# Patient Record
Sex: Male | Born: 1945 | Race: White | Hispanic: No | Smoking: Former smoker
Health system: Southern US, Community
[De-identification: ages and names within clinical notes are randomized; demographics above are authoritative.]

## PROBLEM LIST (undated history)

## (undated) DIAGNOSIS — Z85820 Personal history of malignant melanoma of skin: Secondary | ICD-10-CM

## (undated) DIAGNOSIS — I2510 Atherosclerotic heart disease of native coronary artery without angina pectoris: Secondary | ICD-10-CM

## (undated) DIAGNOSIS — I10 Essential (primary) hypertension: Secondary | ICD-10-CM

## (undated) DIAGNOSIS — I4891 Unspecified atrial fibrillation: Secondary | ICD-10-CM

## (undated) DIAGNOSIS — C801 Malignant (primary) neoplasm, unspecified: Secondary | ICD-10-CM

## (undated) DIAGNOSIS — K449 Diaphragmatic hernia without obstruction or gangrene: Secondary | ICD-10-CM

## (undated) DIAGNOSIS — G4733 Obstructive sleep apnea (adult) (pediatric): Secondary | ICD-10-CM

## (undated) DIAGNOSIS — G510 Bell's palsy: Secondary | ICD-10-CM

## (undated) DIAGNOSIS — E119 Type 2 diabetes mellitus without complications: Secondary | ICD-10-CM

## (undated) HISTORY — DX: Obstructive sleep apnea (adult) (pediatric): G47.33

## (undated) HISTORY — DX: Type 2 diabetes mellitus without complications: E11.9

## (undated) HISTORY — DX: Atherosclerotic heart disease of native coronary artery without angina pectoris: I25.10

## (undated) HISTORY — PX: REPLACEMENT TOTAL KNEE: SUR1224

## (undated) HISTORY — DX: Diaphragmatic hernia without obstruction or gangrene: K44.9

## (undated) HISTORY — DX: Essential (primary) hypertension: I10

## (undated) HISTORY — DX: Bell's palsy: G51.0

## (undated) HISTORY — DX: Personal history of malignant melanoma of skin: Z85.820

## (undated) HISTORY — DX: Unspecified atrial fibrillation: I48.91

## (undated) HISTORY — PX: MELANOMA EXCISION: SHX5266

## (undated) HISTORY — PX: UMBILICAL HERNIA REPAIR: SHX196

---

## 2004-03-16 ENCOUNTER — Inpatient Hospital Stay (HOSPITAL_COMMUNITY): Admission: RE | Admit: 2004-03-16 | Discharge: 2004-03-19 | Payer: Self-pay | Admitting: Orthopedic Surgery

## 2007-07-30 ENCOUNTER — Ambulatory Visit: Payer: Self-pay | Admitting: Cardiology

## 2007-07-31 ENCOUNTER — Inpatient Hospital Stay (HOSPITAL_COMMUNITY): Admission: EM | Admit: 2007-07-31 | Discharge: 2007-08-02 | Payer: Self-pay | Admitting: Cardiology

## 2007-07-31 ENCOUNTER — Ambulatory Visit: Payer: Self-pay | Admitting: Cardiovascular Disease

## 2010-09-29 NOTE — Cardiovascular Report (Signed)
Derek Bond, Derek Bond                ACCOUNT NO.:  [id number]   MEDICAL RECORD NO.:  [id number]          PATIENT TYPE:  INP   LOCATION:  4738                         FACILITY:  [facility]   PHYSICIAN:  Veverly Fells. Excell Seltzer, MD  DATE OF BIRTH:  12/11/1945   DATE OF PROCEDURE:  08/01/2007  DATE OF DISCHARGE:                            CARDIAC CATHETERIZATION   PROCEDURE:  Left heart catheterization, selective coronary angiography,  left ventricular angiography.   INDICATIONS:  Mr. People is a 65 year old gentleman with multiple  cardiac risk factors; including diabetes, hypertension, a family  history, and dyslipidemia.  He presented with typical symptoms of angina  relieved by nitroglycerin.  He also has an abnormal resting EKG.  He was  referred for cardiac catheterization.  Risks and indications of the  procedure were reviewed with the patient.  Informed consent was  obtained.   DESCRIPTION OF PROCEDURE:  The right groin was prepped, draped, and  anesthetized with 1% lidocaine using the modified Seldinger technique.  A 6-French sheath was placed in the right femoral artery.  Standard 6-  French Judkins catheters were used for coronary angiography.  I was  unable to engage the right coronary artery with the JR-4 catheter, so an  AR-1 catheter.  A pigtail catheter was used for left ventriculography.  The patient tolerated procedure well, and had no immediate  complications.  All catheter exchanges were performed over a guidewire.   FINDINGS:  Aortic pressure 118/63 with a mean of 86, left ventricular  pressure 119/90.   CORONARY ANGIOGRAPHY:  1. The left mainstem is angiographically normal.  It it trifurcates      into the LAD intermediate branch and left circumflex.  2. The LAD is large-caliber vessel that courses down and wraps around      the LV apex.  There are mild luminal irregularities throughout the      LAD.  Just between the first septal perforator and the first      diagonal  branch; there is a mild, nonobstructive, 20% stenosis.      The first diagonal branch is of moderate size, and has mild      nonobstructive disease in its branch proximal portion.  The mid-LAD      has mild diffuse plaque.  The distal vessel has a focal      nonobstructive 40% lesion.  3. The left circumflex is a large vessel.  There is a large      intermediate branch that bifurcates into twin vessels, and has no      significant stenosis.  There is also a large first OM branch that      has no significant stenosis.  The circumflex courses down and      supplies two posterolateral branches with no significant stenosis.  4. The right coronary artery is dominant.  There is mild diffuse      plaque in the proximal and midportions with no more than 20%      stenosis.  The vessel supplies two acute marginal branches, and a      PDA branch, as well  as a posterolateral branch.   LEFT VENTRICULAR FUNCTION:  Normal.  The LVEF is 65%.  There is no  mitral regurgitation.   ASSESSMENT:  1. Minimal diffuse nonobstructive coronary artery disease.  2. Normal left ventricular function.   PLAN:  Recommend medical therapy with treatment of cardiac risk factors.  I suspect Mr. Rexrode' chest pain is noncardiac in nature.      Veverly Fells. Excell Seltzer, MD  Electronically Signed     MDC/MEDQ  D:  08/01/2007  T:  08/01/2007  Job:  [id number]   cc:   Learta Codding, MD,FACC

## 2010-09-29 NOTE — Discharge Summary (Signed)
NAMEDONNELL, WION                ACCOUNT NO.:  [id number]   MEDICAL RECORD NO.:  [id number]          PATIENT TYPE:  INP   LOCATION:  4738                         FACILITY:  [facility]   PHYSICIAN:  Veverly Fells. Excell Seltzer, MD  DATE OF BIRTH:  1945-05-30   DATE OF ADMISSION:  08/01/2007  DATE OF DISCHARGE:  08/02/2007                               DISCHARGE SUMMARY   PRIMARY CARDIOLOGIST:  Learta Codding, MD.   DISCHARGE DIAGNOSIS:  Chest pain.   SECONDARY DIAGNOSES:  1. Hypertension.  2. Hyperlipidemia.  3. Diabetes mellitus.  4. Family history of coronary disease.  5. Remote tobacco abuse.  6. History of melanoma in 1996 without recurrence.  7. History of umbilical hernia.  8. Status post left total knee replacement.   ALLERGIES:  NO KNOWN DRUG ALLERGIES.   PROCEDURE:  Left heart cardiac catheterization.   HISTORY OF PRESENT ILLNESS:  A 65 year old male without prior cardiac  history, however, with multiple risk factors.  He presented to [facility] on July 30, 2007 with complaints of chest pain and fatigue as  well as dyspnea.  He was evaluated by Rozell Searing, the physician  assistant on July 31, 2007 along with Dr. Andee Lineman.  Decision was made to  pursue left heart cardiac catheterization.   HOSPITAL COURSE:  The patient was transferred to [facility] on July 31, 2007 and underwent left heart cardiac catheterization on August 01, 2007  revealing minimal nonobstructive coronary artery disease with normal LV  function and an EF of 65%.  He has had no recurrent chest discomfort.  In light of his normal catheterization, he will be discharged home today  in good condition.  We have initiated statin therapy with an LDL of 117  in combination with his history of diabetes and now nonobstructive  coronary artery disease.   LABORATORY DATA:  Total cholesterol 173, triglycerides 129, HDL 117, LDL  30, hemoglobin 13.3, hematocrit 38.7, WBC 5.9, platelets 220, MCV 91.1,  sodium 139,  potassium 3.6, chloride 106, CO2 26, BUN 9, creatinine 0.71,  glucose 98, calcium 9.6.   DISPOSITION:  The patient is being discharged home today in good  condition.   FOLLOW-UP APPOINTMENTS:  He is asked to follow up with Dr. Sherril Croon in 1-2  weeks.   DISCHARGE MEDICATIONS:  1. Aspirin 81 mg daily.  2. Simvastatin 40 mg q.h.s.  3. Glipizide 10 mg b.i.d.  4. Exforge 5/160 mg daily.  5. Diclofenac 75 mg b.i.d.  6. Metformin 500 mg b.i.d. to resume August 04, 2007.   OUTSTANDING LAB STUDIES:  The patient should have repeat lipids and LFTs  in approximately 8 weeks.      Nicolasa Ducking, ANP      Veverly Fells. Excell Seltzer, MD  Electronically Signed    CB/MEDQ  D:  08/02/2007  T:  08/02/2007  Job:  [id number]   cc:   Doreen Beam, MD

## 2010-10-02 NOTE — Discharge Summary (Signed)
NAMEELEUTERIO, DOLLAR                ACCOUNT NO.:  [id number]   MEDICAL RECORD NO.:  [id number]          PATIENT TYPE:  INP   LOCATION:  0452                         FACILITY:  [facility]   PHYSICIAN:  Ollen Gross, M.D.    DATE OF BIRTH:  1945-09-29   DATE OF ADMISSION:  03/16/2004  DATE OF DISCHARGE:  03/19/2004                                 DISCHARGE SUMMARY   ADMISSION DIAGNOSES:  1.  Osteoarthritis left knee.  2.  Hypertension.  3.  Non-insulin-dependent diabetes mellitus.  4.  History of melanoma cancer.  5.  Patient with a history of bronchitis.  6.  Past history of ulcers.  7.  Asymptomatic hiatal hernia.  8.  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  9.  History of sleep apnea.   DISCHARGE DIAGNOSES:  1.  Osteoarthritis left knee status post left total knee replacement      arthroplasty computer navigation.  2.  Mild postoperative blood loss anemia that did not require transfusion.  3.  Mild hyponatremia and mild hypokalemia postop.  4.  Hypertension.  5.  Non-insulin-dependent diabetes mellitus.  6.  History of melanoma cancer.  7.  Patient with a history of bronchitis.  8.  Past history of ulcers.  9.  Asymptomatic hiatal hernia.  10. 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  11. History of sleep apnea.   PROCEDURE:  Date of surgery March 16, 2004, left total knee arthroplasty  computer navigation.  Surgeon Dr. Homero Fellers Aluisio's, assistant Avel Peace,  P.A.-C., anesthesia general, postop Marcaine pain pump. Minimal blood loss.  Hemovac drain x1, tourniquet time 71 minutes at 300 mmHg.   CONSULTATIONS:  None.   BRIEF HISTORY:  A 65 year old male with severe left knee pain that has been  refractory to nonoperative management and has known bone on bone arthritis  through a previous arthroscopy with a large medial femoral condyle defect  and patellofemoral arthritis felt to benefit from a total knee, subsequently  admitted to the  hospital.   LABORATORY DATA:  CBC preop, hemoglobin of 15.9, hematocrit of 46.8,  differential within normal limits, postop H&H 13.8 and 40.2, last noted H&H  12.4 and 36.0.  PT and PTT preop 12.6 and 28 respectively with INR of 0.9.  Serial pro times followed, last noted PT/INR 16.2 and 1.4.  Chem panel on  admission glucose elevated at 177, remaining chem panel all within normal  limits.  Serial BMET's are followed. Sodium did drop from 138 to 133  stabilized at 133. He had a drop in his potassium from 4.8 to 4.0, last  noted at 3.4.  Glucose came down from 177 to 121.  Urinalysis positive for  glucose otherwise negative, blood group type O positive.   EKG dated March 10, 2004 normal sinus rhythm, septal infarct age  undetermined, RSR, Q/R patter and V1 suggests right ventricular conduction  delay, no previous tracing confirmed by Dr. Lady Deutscher.  A two view chest  March 10, 2004 with no active disease, minimal bibasilar atelectasis  and/or scarring.   HOSPITAL COURSE:  The patient was admitted  to [facility], taken to  the OR and underwent the above procedure, later transferred to the recovery  room on the orthopedic floor, started back on home medications. Actually had  a fair amount of postop pain and was treated with p.o. and IV analgesics,  started back on his home medications and Hemovac drain that was placed at  the time of surgery was pulled on day one.  He started getting up with  physical therapy out of bed. By day two,  he started progressive a little  bit better and was ambulating approximately 25 feet in the morning and got  up to about 60 feet that afternoon.  The dressing was change, incision was  healing well with no signs of infection.  Serum glucose which was elevated  initially after surgery did improve and his CBG's were running in the 115-  120 range. By day two, he was back on his diabetic medications.  He  continued to progress well and by day  three, his pain was under good  control. He was progressing and ambulating 200 feet with therapy. It was  decided the patient could be discharged home at that time.   PLAN:  1.  The patient discharged home on March 19, 2004.  2.  Discharge diagnosis, please see above.  3.  Discharge medications, Coumadin, Percocet, Robaxin.  4.  Diet, diabetic.  5.  Followup two weeks from surgery.  6.  Activity, home health PT, home health nursing, total knee protocol.     Alex   ALP/MEDQ  D:  04/23/2004  T:  04/24/2004  Job:  [id number]   cc:   Doreen Beam  [address]  [place]  [place] [address]  Fax: [phone]

## 2010-10-02 NOTE — Op Note (Signed)
Derek Bond, Derek Bond                ACCOUNT NO.:  [id number]   MEDICAL RECORD NO.:  [id number]          PATIENT TYPE:  INP   LOCATION:  0004                         FACILITY:  [facility]   PHYSICIAN:  Ollen Gross, M.D.    DATE OF BIRTH:  Apr 25, 1946   DATE OF PROCEDURE:  03/16/2004  DATE OF DISCHARGE:                                 OPERATIVE REPORT   PREOPERATIVE DIAGNOSIS:  Osteoarthritis, left knee.   POSTOPERATIVE DIAGNOSIS:  Osteoarthritis, left knee.   PROCEDURE:  Right total knee arthroplasty, computer navigated.   SURGEON:  Gus Rankin. Aluisio, M.D.   ASSISTANT:  Avel Peace, PA-C   ANESTHESIA:  General with postop Marcaine pain pump.   ESTIMATED BLOOD LOSS:  Minimal.   DRAIN:  Hemovac x 1.   TOURNIQUET TIME:  71 minutes at 300 mmHg.   COMPLICATIONS:  None.   CONDITION:  Stable to recovery.   BRIEF CLINICAL NOTE:  Derek Bond is a 65 year old male with severe left knee  pain which has been refractory to nonoperative management including  injections.  He has had a previous arthroscopy showing bone-on-bone disease  in the patellofemoral compartment as well as a large medial femoral condyle  chondral defect.  He presents now for left total knee arthroplasty secondary  to intractable pain.   PROCEDURE IN DETAIL:  After the successful administration of general  anesthetic, a tourniquet is placed high on his left thigh and left lower  extremity prepped and draped in the usual sterile fashion.  The extremity is  wrapped in Esmarch, knee flexed, then tourniquet inflated to 300 mmHg.  A  standard midline incision is made with a 10 blade through subcutaneous  tissue to the level of the extensor mechanism.  A fresh blade is used to  make a medial parapatellar arthrotomy, and then the soft tissue over the  proximal and medial tibia is subperiosteally elevated to the joint line with  a knife and into the semimembranosus bursa with a Cobb elevator.  Soft  tissue over the proximal  and lateral tibia is also elevated with attention  being paid to avoiding the patellar tendon on tibial tubercle.  The patella  is everted, knee flexed 90 degrees, and ACL and PCL are removed.  It was  bone-on-bone at the patellofemoral compartment.  He did have a 2 x 2 cm  defect in the medial femoral condyle with fiber cartilage covering it but  with some of the fiber cartilage actively delaminating.  We then placed the  guidepins for the computer navigation arrays.  The arrays are placed, and  then the computer inputting is performed to generate the anatomic models of  the femur and tibia.  His preop is about 4 degrees varus and 5-degree  flexion contracture.  Once the inputting is completed, then we were prepared  for the distal femur cut.  The cutting block is placed.  Then under the  computer navigation, we put it in neutral varus-valgus and removed 10 mm off  the distal femur.  Distal femoral resection is made with an oscillating saw.  We confirmed that  the cut was in good position.  The sizing block is placed,  and we got a size 4 with the intraoperative sizing block.  The computer  generated a size 5 on the model, but size 4 was more appropriate.  We then  placed the rotation off the epicondylar axis, put the size 4 cutting block  on, and made the anterior, posterior, and chamfer cuts.  I then made an  intercondylar cut and placed the size 4 femoral trial with excellent fit.   Tibia is subluxed forward, and the menisci are removed.  The tibial cutting  guide is placed to remove 10 mm off the nondeficient lateral side.  The  block is placed in neutral varus-valgus and neutral posterior slope.  The  block is pinned, and then the cut is made with the oscillating saw.  Size 4  is also the most appropriate tibial component, and the proximal tibia is  then prepared with the modular drill and keel punch for the size 4.  A 10 mm  posterior stabilized rotating platform trial is placed.   Full extension is  achieved with excellent varus and valgus balance throughout full range of  motion.  Patella is everted, thickness measured to be 24 mm, free-hand  resection taken to 14 mm, 38 template placed, lug holes are drilled; trial  patellar is placed, and it tracks normally.  The trials are then removed and  the osteophytes removed off the posterior femur with an osteotome.  The pins  for the computer arrays are then removed.  Cement is mixed, and cut bone  surfaces are prepared with pulsatile lavage.  Once ready for implantation,  the size 4 mobile bearing tibial tray, size 4 posterior stabilized femur,  and 38 patella are cemented into place and patella is held with a clamp.  Trial 10 mm insert is placed and knee held in full extension and all  extruded cement removed.  Once the cement is fully hardened, then the  permanent 10 mm posterior stabilized rotating platform insert is placed into  the tibial tray.  The wound is then copiously irrigated with saline solution  and the extensor mechanism closed over a Hemovac drain with interrupted #1  PDS.  Flexion against gravity is 140 degrees.  Tourniquet is released for a  total time of 71 minutes.  Subcu is closed with interrupted 2-0 Vicryl and  subcuticular with running 4-0 Monocryl.  The catheter for the Marcaine pain  pump is placed and the pump initiated.  Steri-Strips and a bulky sterile  dressing are applied.  The Hemovac is hooked to suction.  The knee is placed  into a knee immobilizer, awakened, and transported to recovery in stable  condition.     Drenda Freeze   FA/MEDQ  D:  03/16/2004  T:  03/16/2004  Job:  [id number]

## 2010-10-02 NOTE — H&P (Signed)
Derek Bond, Derek Bond                ACCOUNT NO.:  [id number]   MEDICAL RECORD NO.:  [id number]          PATIENT TYPE:  INP   LOCATION:  NA                           FACILITY:  [facility]   PHYSICIAN:  Ollen Gross, M.D.    DATE OF BIRTH:  [birth year]   DATE OF ADMISSION:  DATE OF DISCHARGE:                                HISTORY & PHYSICAL   CHIEF COMPLAINT:  Left-knee pain.   HISTORY OF PRESENT ILLNESS:  The patient is a 65 year old male who is seen  by Dr. Ollen Gross, for ongoing left-knee arthritis.  He has undergone  Cortizone injections in the past which have not helped.  He was seen in the  office where x-rays showed significant medium compartment arthritis and also  patellofemoral arthritis.  He has been refractory to nonoperative  management, and would like to have something done about it.  The pain is  progressive to the point that it is limiting what he can and can not do.  He  is felt to be a candidate. The risks and benefits of the procedure have been  discussed with the patient, and he elects to proceed with surgery.   ALLERGIES:  No known drug allergies.   CURRENT MEDICATIONS:  1.  Altace 10 mg daily.  2.  Glucotrol 10 mg daily.   PAST MEDICAL HISTORY:  1.  Hypertension.  2.  Hypertension.  3.  Noninsulin-dependent diabetes mellitus.  4.  Melanoma cancer.  5.  History of bronchitis.  6.  Hiatal hernia.  7.  History of sleep apnea although he has lost weight and does not use his      machine any more.  8.  History of ulcers.  9.  Left facial paralysis secondary to laceration when he was 65 years old.   PAST SURGICAL HISTORY:  1.  Umbilical hernia.  2.  Melanoma excision x2, one from the left facial cheek, and one from the      left facial nerve.   SOCIAL HISTORY:  He is married.  He quit smoking about 10 years ago.  He  quit alcohol about 15 years ago.  He has two children.  Spouse will be  assisting with care after surgery.   FAMILY HISTORY:  Father  deceased at age 32 with lung cancer.  Mother  deceased in her 38's with diabetes.  She unfortunately died in a house fire.  Family history is also significant for hypertension, diabetes, rheumatoid  arthritis and stroke.   REVIEW OF SYSTEMS:  Fever, chills, night sweats.  He does have some left  partial facial paralysis.  He denies any visual problems.  He denies any  significant swallowing problems.  No seizures or stroke.  Respiratory, no  shortness of breath, cough or hemoptysis.  Cardiovascular, no chest pain,  angina or orthopnea.  GI:  No nausea, vomiting, diarrhea or constipation  although he does have an asymptomatic hiatal hernia.  GU:  No dysuria,  hematuria or discharge.  Musculoskeletal:  Pertinent of that of the knee as  in history of present illness.   PHYSICAL EXAMINATION:  VITAL SIGNS:  Pulse 76, respirations 12, blood  pressure 118/70.  GENERAL:  A 65 year old white male, well-nourished, well-developed, large  frame.  No acute distress.  He is alert and cooperative.  HEENT:  Normocephalic, atraumatic.  He does have a previous laceration scar  on the left side of the face starting in the left parietal region, coming  down toward his posterior facial cheek and toward his neck.  He does have  some partial facial paralysis with a slight eyebrow drooping in the left  eye.  He does have good motor control surrounding his mouth.  EOM's are  intact.  Oropharynx is clear.  He does have slight decrease in sensation in  the face on the three branches of the left facial trigeminal nerve.  NECK:  Supple.  CHEST:  Clear.  No rhonchi, rales or wheezing.  HEART:  Regular rhythm, no murmurs.  ABDOMEN:  Soft, nontender.  Bowel sounds are present.  Slightly round.  BREAST/ GENITALIA:  Not done, not pertinent to present illness.  EXTREMITIES:  Significant that left knee range of motion of 0-120 and a  significant crepitance on range of motion, pain with patellar compression,  pain  medially.  No instability.  He does ambulate with an antalgic gait.   IMPRESSION:  1.  Osteoarthritis left knee.  2.  Hypertension.  3.  Noninsulin-dependent diabetes mellitus.  4.  History of melanoma cancer.  5.  History of bronchitis.  6.  Past history of ulcers.  7.  Hiatal hernia, asymptomatic.  8.  Partial left facial paralysis secondary to a laceration.  9.  History of sleep apnea.  (currently not using his machine).   PLAN:  The patient will be admitted at [facility] and will undergo  left total knee arthroplasty.  Surgery will be performed by Dr. Ollen Gross.  The patient's medical doctor is Dr. Sherril Croon.  Dr. Sherril Croon will be  notified of the admission, and consulted if needed for medical assistance  with the patient during the hospital course.     Alex   ALP/MEDQ  D:  03/15/2004  T:  03/15/2004  Job:  [id number]   cc:   Doreen Beam  [address]  [place]  [place] [address]  Fax: [phone]   Ollen Gross, M.D.  [facility]  [address]  [address]  [place]  [place] [address]  Fax: [phone]

## 2011-02-08 LAB — BASIC METABOLIC PANEL
BUN: 9
BUN: 9
CO2: 26
CO2: 26
Calcium: 9.6
Calcium: 9.6
Chloride: 104
Chloride: 106
Creatinine, Ser: 0.68
Creatinine, Ser: 0.71
GFR calc Af Amer: >60
GFR calc Af Amer: >60
GFR calc non Af Amer: >60
GFR calc non Af Amer: >60
Glucose, Bld: 89
Glucose, Bld: 98
Potassium: 3.6
Potassium: 3.6
Sodium: 138
Sodium: 139

## 2011-02-08 LAB — CBC
HCT: 38.7 — ABNORMAL LOW
HCT: 39.7
Hemoglobin: 13.3
Hemoglobin: 13.5
MCHC: 33.9
MCHC: 34.3
MCV: 91.1
MCV: 92
Platelets: 220
Platelets: 228
RBC: 4.24
RBC: 4.32
RDW: 13.6
RDW: 13.7
WBC: 5.9
WBC: 6.2

## 2011-02-08 LAB — LIPID PANEL
Cholesterol: 173
HDL: 30 — ABNORMAL LOW
LDL Cholesterol: 117 — ABNORMAL HIGH
Total CHOL/HDL Ratio: 5.8
Triglycerides: 129
VLDL: 26

## 2011-02-08 LAB — HEPARIN LEVEL (UNFRACTIONATED): Heparin Unfractionated: 0.15 — ABNORMAL LOW

## 2016-02-09 ENCOUNTER — Encounter: Payer: Self-pay | Admitting: *Deleted

## 2016-02-10 ENCOUNTER — Encounter: Payer: Self-pay | Admitting: *Deleted

## 2016-02-10 ENCOUNTER — Telehealth: Payer: Self-pay | Admitting: Cardiology

## 2016-02-10 ENCOUNTER — Ambulatory Visit (INDEPENDENT_AMBULATORY_CARE_PROVIDER_SITE_OTHER): Payer: Medicare HMO | Admitting: Cardiology

## 2016-02-10 ENCOUNTER — Encounter: Payer: Self-pay | Admitting: Cardiology

## 2016-02-10 VITALS — BP 132/92 | HR 69 | Ht 78.0 in | Wt 238.0 lb

## 2016-02-10 DIAGNOSIS — I25119 Atherosclerotic heart disease of native coronary artery with unspecified angina pectoris: Secondary | ICD-10-CM

## 2016-02-10 DIAGNOSIS — Z794 Long term (current) use of insulin: Secondary | ICD-10-CM

## 2016-02-10 DIAGNOSIS — E1159 Type 2 diabetes mellitus with other circulatory complications: Secondary | ICD-10-CM | POA: Diagnosis not present

## 2016-02-10 DIAGNOSIS — I10 Essential (primary) hypertension: Secondary | ICD-10-CM | POA: Diagnosis not present

## 2016-02-10 DIAGNOSIS — R0609 Other forms of dyspnea: Secondary | ICD-10-CM | POA: Diagnosis not present

## 2016-02-10 NOTE — Progress Notes (Signed)
Cardiology Office Note  Date: 02/10/2016   ID: Derek Bond, DOB [birth year], MRN [id number]  PCP: Glenda Chroman, MD  Consulting Cardiologist: Rozann Lesches, MD   Chief Complaint  Patient presents with  . Dyspnea and exertion and fatigue  . Coronary Artery Disease    History of Present Illness: Derek Bond Ems is a 70 y.o. male referred for cardiology consultation by Dr. Woody Seller. He presents reporting a history of unusual fatigue and dyspnea on exertion a few months ago. Also feeling indigestion symptoms and fullness in his upper chest. This reminded him somewhat of prior trouble that he had with a hiatal hernia, but not completely. He states that he has been busy, also under stress, primary caregiver for his wife who underwent double lung transplant at [facility] about 8 years ago. Within the last few weeks he has been feeling somewhat better.  He has undergone prior cardiac evaluation, cardiac catheterization in 2009 demonstrated mild coronary atherosclerosis. More recent echocardiogram was obtained earlier this month at [facility] with results outlined below. He has not undergone updated ischemic testing.  I reviewed his medications which are outlined below. He reports no changes or intolerances.  Recent ECG shows sinus rhythm with incomplete left bundle branch block.  Past Medical History:  Diagnosis Date  . CAD (coronary artery disease)    Mild nonobstructive disease at cardiac catheterization 2009  . Essential hypertension   . 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 Hiatal hernia   . History of melanoma   . Obstructive sleep apnea   . Type 2 diabetes mellitus (Derek Bond)     Past Surgical History:  Procedure Laterality Date  . MELANOMA EXCISION     Left facial region  . REPLACEMENT TOTAL KNEE Left   . UMBILICAL HERNIA REPAIR      Current Outpatient Prescriptions  Medication Sig Dispense Refill  . amLODipine (NORVASC) 5 MG tablet Take 5  mg by mouth daily.    Marland Kitchen aspirin EC 81 MG tablet Take 81 mg by mouth daily.    . diclofenac (VOLTAREN) 75 MG EC tablet Take 75 mg by mouth 2 (two) times daily.    Marland Kitchen glipiZIDE (GLUCOTROL XL) 10 MG 24 hr tablet Take 10 mg by mouth 2 (two) times daily.    . insulin aspart (NOVOLOG) 100 UNIT/ML injection Inject 14 Units into the skin 3 (three) times daily before meals.    . Insulin Degludec (TRESIBA FLEXTOUCH [place]) Inject 46 Units into the skin.    Marland Kitchen losartan (COZAAR) 100 MG tablet Take 100 mg by mouth daily.    . metFORMIN (GLUCOPHAGE) 1000 MG tablet Take 1,000 mg by mouth 2 (two) times daily with a meal.    . simvastatin (ZOCOR) 40 MG tablet Take 40 mg by mouth daily.     No current facility-administered medications for this visit.    Allergies:  Review of patient's allergies indicates no known allergies.   Social History: The patient  reports that he quit smoking about 22 years ago. He has never used smokeless tobacco. He reports that he does not drink alcohol or use drugs.   Family History: The patient's family history includes Diabetes in his mother and other; Hypertension in his other; Lung cancer in his father; Rheum arthritis in his other; Stroke in his other.   ROS:  Please see the history of present illness. Otherwise, complete review of systems is positive for intermittent  leg cramping.  All other systems are reviewed and negative.   Physical Exam: VS:  BP (!) 132/92   Pulse 69   Ht 6\' 6"  (1.981 m)   Wt 238 lb (108 kg)   SpO2 95%   BMI 27.50 kg/m , BMI Body mass index is 27.5 kg/m.  Wt Readings from Last 3 Encounters:  02/10/16 238 lb (108 kg)    General: Tall male, appears comfortable at rest. HEENT: Conjunctiva and lids normal, oropharynx clear. Neck: Supple, no elevated JVP or carotid bruits, no thyromegaly. Lungs: Clear to auscultation, nonlabored breathing at rest. Cardiac: Regular rate and rhythm, no S3 or significant systolic murmur, no pericardial rub. Abdomen: Soft,  nontender, bowel sounds present, no guarding or rebound. Extremities: No pitting edema, distal pulses 1-2+. Skin: Warm and dry. Musculoskeletal: No kyphosis. Neuropsychiatric: Alert and oriented x3, affect grossly appropriate.  ECG: I personally reviewed the tracing from 01/27/2016 which showed sinus rhythm with incomplete left bundle branch block.  Recent Labwork:  September 2017: TSH 2.39, hemoglobin 14.2, platelets 254 July 2017: Hemoglobin A1c 7.1  Other Studies Reviewed Today:  Cardiac catheterization 08/01/2007:  FINDINGS:  Aortic pressure 118/63 with a mean of 86, left ventricular  pressure 119/90.   CORONARY ANGIOGRAPHY:  1. The left mainstem is angiographically normal.  It it trifurcates      into the LAD intermediate branch and left circumflex.  2. The LAD is large-caliber vessel that courses down and wraps around      the LV apex.  There are mild luminal irregularities throughout the      LAD.  Just between the first septal perforator and the first      diagonal branch; there is a mild, nonobstructive, 20% stenosis.      The first diagonal branch is of moderate size, and has mild      nonobstructive disease in its branch proximal portion.  The mid-LAD      has mild diffuse plaque.  The distal vessel has a focal      nonobstructive 40% lesion.  3. The left circumflex is a large vessel.  There is a large      intermediate branch that bifurcates into twin vessels, and has no      significant stenosis.  There is also a large first OM branch that      has no significant stenosis.  The circumflex courses down and      supplies two posterolateral branches with no significant stenosis.  4. The right coronary artery is dominant.  There is mild diffuse      plaque in the proximal and midportions with no more than 20%      stenosis.  The vessel supplies two acute marginal branches, and a      PDA branch, as well as a posterolateral branch.   LEFT VENTRICULAR FUNCTION:  Normal.   The LVEF is 65%.  There is no  mitral regurgitation.  Echocardiogram 02/02/2016 [facility]): Mild LVH with LVEF greater than 123456, diastolic dysfunction unspecified, mild left atrial enlargement, trace mitral regurgitation, borderline mild aortic stenosis, mild tricuspid regurgitation, no pericardial effusion.  Assessment and Plan:  1. History of unusual fatigue and dyspnea and exertion within the last few months. Also reported reflux symptoms with some chest fullness. ECG shows incomplete left bundle branch block. He has a history of long-standing type 2 diabetes mellitus and previously documented coronary atherosclerosis by cardiac catheterization in 2009. We discussed follow-up objective ischemic testing and will  arrange a [employer].  2. Nonobstructive CAD documented by cardiac catheterization in 2009. Follow-up ischemic testing is being arranged as outlined above. He is on aspirin and statin therapy.  3. Type 2 diabetes mellitus, on Glucotrol XL, Glucophage, and insulin. He follows with Dr. Woody Seller. Recent hemoglobin A1c 7.1.  4. Essential hypertension, blood pressure mildly elevated today. He reports compliance with his medications including Norvasc and Cozaar.  Current medicines were reviewed with the patient today.   Orders Placed This Encounter  Procedures  . NM Myocar Multi W/Spect W/Wall Motion / EF  . Myocardial Perfusion Imaging    Disposition: Call with results.  Signed, Satira Sark, MD, [facility] 02/10/2016 8:41 AM    [facility] [place], [place], [place] [address] Phone: [phone]; Fax: [phone]

## 2016-02-10 NOTE — Patient Instructions (Signed)
Medication Instructions:  Continue all current medications.  Labwork: none  Testing/Procedures:  Your physician has requested that you have a lexiscan myoview. For further information please visit [email or web address]. Please follow instruction sheet, as given.  Office will contact with results via phone or letter.    Follow-Up: To be determined based on test results.   Any Other Special Instructions Will Be Listed Below (If Applicable).  If you need a refill on your cardiac medications before your next appointment, please call your pharmacy.  

## 2016-02-10 NOTE — Telephone Encounter (Signed)
Checking percert for Derek Bond, CAD w/ unspecified angina  Scheduled for 02-12-16 at [facility].

## 2016-02-12 ENCOUNTER — Inpatient Hospital Stay (HOSPITAL_COMMUNITY): Admission: RE | Admit: 2016-02-12 | Payer: Medicare HMO | Source: Ambulatory Visit

## 2016-02-12 ENCOUNTER — Encounter (HOSPITAL_COMMUNITY)
Admission: RE | Admit: 2016-02-12 | Discharge: 2016-02-12 | Disposition: A | Discharge status: Home / Self Care | Payer: Medicare HMO | Source: Ambulatory Visit | Attending: Cardiology | Admitting: Cardiology

## 2016-02-12 ENCOUNTER — Encounter (HOSPITAL_COMMUNITY): Payer: Self-pay

## 2016-02-12 DIAGNOSIS — R0609 Other forms of dyspnea: Secondary | ICD-10-CM | POA: Diagnosis not present

## 2016-02-12 DIAGNOSIS — I25119 Atherosclerotic heart disease of native coronary artery with unspecified angina pectoris: Secondary | ICD-10-CM | POA: Diagnosis not present

## 2016-02-12 HISTORY — DX: Malignant (primary) neoplasm, unspecified: C80.1

## 2016-02-12 LAB — NM MYOCAR MULTI W/SPECT W/WALL MOTION / EF
LV dias vol: 105 mL (ref 62–150)
LV sys vol: 48 mL
Peak HR: 92 {beats}/min
RATE: 0.28
Rest HR: 57 {beats}/min
SDS: 1
SRS: 2
SSS: 3
TID: 0.99

## 2016-02-12 MED ORDER — SODIUM CHLORIDE 0.9% FLUSH
INTRAVENOUS | Status: AC
  Administered 2016-02-12: 10 mL via INTRAVENOUS
  Filled 2016-02-12: qty 10

## 2016-02-12 MED ORDER — REGADENOSON 0.4 MG/5ML IV SOLN
INTRAVENOUS | Status: AC
  Administered 2016-02-12: 0.4 mg via INTRAVENOUS
  Filled 2016-02-12: qty 5

## 2016-02-12 MED ORDER — TECHNETIUM TC 99M TETROFOSMIN IV KIT
10.0000 | PACK | Freq: Once | INTRAVENOUS | Status: AC | PRN
  Administered 2016-02-12: 11 via INTRAVENOUS

## 2016-02-12 MED ORDER — TECHNETIUM TC 99M TETROFOSMIN IV KIT
30.0000 | PACK | Freq: Once | INTRAVENOUS | Status: AC | PRN
  Administered 2016-02-12: 32 via INTRAVENOUS

## 2016-02-13 ENCOUNTER — Telehealth: Payer: Self-pay | Admitting: *Deleted

## 2016-02-13 NOTE — Telephone Encounter (Signed)
Patient informed and copy sent to PCP. 

## 2016-02-13 NOTE — Telephone Encounter (Signed)
-----   Message from Satira Sark, MD sent at 02/12/2016 12:47 PM EDT ----- Results reviewed. Overall low risk study. There were no large regions of ischemia to suspect major progression in CAD from prior assessment. No further cardiac testing planned at this time unless his symptoms progress. A copy of this test should be forwarded to Glenda Chroman, MD.

## 2017-10-18 DIAGNOSIS — Z6827 Body mass index (BMI) 27.0-27.9, adult: Secondary | ICD-10-CM | POA: Diagnosis not present

## 2017-10-18 DIAGNOSIS — E1165 Type 2 diabetes mellitus with hyperglycemia: Secondary | ICD-10-CM | POA: Diagnosis not present

## 2017-10-18 DIAGNOSIS — Z299 Encounter for prophylactic measures, unspecified: Secondary | ICD-10-CM | POA: Diagnosis not present

## 2017-10-18 DIAGNOSIS — K219 Gastro-esophageal reflux disease without esophagitis: Secondary | ICD-10-CM | POA: Diagnosis not present

## 2017-10-18 DIAGNOSIS — M1990 Unspecified osteoarthritis, unspecified site: Secondary | ICD-10-CM | POA: Diagnosis not present

## 2017-10-18 DIAGNOSIS — I10 Essential (primary) hypertension: Secondary | ICD-10-CM | POA: Diagnosis not present

## 2017-11-23 DIAGNOSIS — M48061 Spinal stenosis, lumbar region without neurogenic claudication: Secondary | ICD-10-CM | POA: Diagnosis not present

## 2017-11-23 DIAGNOSIS — M79604 Pain in right leg: Secondary | ICD-10-CM | POA: Diagnosis not present

## 2017-11-23 DIAGNOSIS — Z96652 Presence of left artificial knee joint: Secondary | ICD-10-CM | POA: Diagnosis not present

## 2017-11-23 DIAGNOSIS — M79605 Pain in left leg: Secondary | ICD-10-CM | POA: Diagnosis not present

## 2018-01-25 DIAGNOSIS — Z6827 Body mass index (BMI) 27.0-27.9, adult: Secondary | ICD-10-CM | POA: Diagnosis not present

## 2018-01-25 DIAGNOSIS — Z299 Encounter for prophylactic measures, unspecified: Secondary | ICD-10-CM | POA: Diagnosis not present

## 2018-01-25 DIAGNOSIS — E7800 Pure hypercholesterolemia, unspecified: Secondary | ICD-10-CM | POA: Diagnosis not present

## 2018-01-25 DIAGNOSIS — E1165 Type 2 diabetes mellitus with hyperglycemia: Secondary | ICD-10-CM | POA: Diagnosis not present

## 2018-01-25 DIAGNOSIS — I10 Essential (primary) hypertension: Secondary | ICD-10-CM | POA: Diagnosis not present

## 2018-02-22 DIAGNOSIS — Z23 Encounter for immunization: Secondary | ICD-10-CM | POA: Diagnosis not present

## 2018-02-22 DIAGNOSIS — Z299 Encounter for prophylactic measures, unspecified: Secondary | ICD-10-CM | POA: Diagnosis not present

## 2018-02-22 DIAGNOSIS — I10 Essential (primary) hypertension: Secondary | ICD-10-CM | POA: Diagnosis not present

## 2018-02-22 DIAGNOSIS — Z6827 Body mass index (BMI) 27.0-27.9, adult: Secondary | ICD-10-CM | POA: Diagnosis not present

## 2018-02-22 DIAGNOSIS — E1165 Type 2 diabetes mellitus with hyperglycemia: Secondary | ICD-10-CM | POA: Diagnosis not present

## 2018-03-21 DIAGNOSIS — Z299 Encounter for prophylactic measures, unspecified: Secondary | ICD-10-CM | POA: Diagnosis not present

## 2018-03-21 DIAGNOSIS — J019 Acute sinusitis, unspecified: Secondary | ICD-10-CM | POA: Diagnosis not present

## 2018-03-21 DIAGNOSIS — E1165 Type 2 diabetes mellitus with hyperglycemia: Secondary | ICD-10-CM | POA: Diagnosis not present

## 2018-03-21 DIAGNOSIS — Z6832 Body mass index (BMI) 32.0-32.9, adult: Secondary | ICD-10-CM | POA: Diagnosis not present

## 2018-03-21 DIAGNOSIS — R202 Paresthesia of skin: Secondary | ICD-10-CM | POA: Diagnosis not present

## 2018-03-21 DIAGNOSIS — R42 Dizziness and giddiness: Secondary | ICD-10-CM | POA: Diagnosis not present

## 2018-03-21 DIAGNOSIS — I10 Essential (primary) hypertension: Secondary | ICD-10-CM | POA: Diagnosis not present

## 2018-04-06 DIAGNOSIS — Z96652 Presence of left artificial knee joint: Secondary | ICD-10-CM | POA: Diagnosis not present

## 2018-04-06 DIAGNOSIS — K219 Gastro-esophageal reflux disease without esophagitis: Secondary | ICD-10-CM | POA: Diagnosis not present

## 2018-04-06 DIAGNOSIS — E1165 Type 2 diabetes mellitus with hyperglycemia: Secondary | ICD-10-CM | POA: Diagnosis not present

## 2018-04-06 DIAGNOSIS — I10 Essential (primary) hypertension: Secondary | ICD-10-CM | POA: Diagnosis not present

## 2018-04-06 DIAGNOSIS — E663 Overweight: Secondary | ICD-10-CM | POA: Diagnosis not present

## 2018-04-06 DIAGNOSIS — Z85820 Personal history of malignant melanoma of skin: Secondary | ICD-10-CM | POA: Diagnosis not present

## 2018-04-06 DIAGNOSIS — Z87891 Personal history of nicotine dependence: Secondary | ICD-10-CM | POA: Diagnosis not present

## 2018-04-06 DIAGNOSIS — Z6826 Body mass index (BMI) 26.0-26.9, adult: Secondary | ICD-10-CM | POA: Diagnosis not present

## 2018-04-06 DIAGNOSIS — Z9841 Cataract extraction status, right eye: Secondary | ICD-10-CM | POA: Diagnosis not present

## 2018-05-03 DIAGNOSIS — I10 Essential (primary) hypertension: Secondary | ICD-10-CM | POA: Diagnosis not present

## 2018-05-03 DIAGNOSIS — Z6827 Body mass index (BMI) 27.0-27.9, adult: Secondary | ICD-10-CM | POA: Diagnosis not present

## 2018-05-03 DIAGNOSIS — E1165 Type 2 diabetes mellitus with hyperglycemia: Secondary | ICD-10-CM | POA: Diagnosis not present

## 2018-05-03 DIAGNOSIS — Z299 Encounter for prophylactic measures, unspecified: Secondary | ICD-10-CM | POA: Diagnosis not present

## 2018-08-11 DIAGNOSIS — Z6826 Body mass index (BMI) 26.0-26.9, adult: Secondary | ICD-10-CM | POA: Diagnosis not present

## 2018-08-11 DIAGNOSIS — E7800 Pure hypercholesterolemia, unspecified: Secondary | ICD-10-CM | POA: Diagnosis not present

## 2018-08-11 DIAGNOSIS — Z299 Encounter for prophylactic measures, unspecified: Secondary | ICD-10-CM | POA: Diagnosis not present

## 2018-08-11 DIAGNOSIS — I10 Essential (primary) hypertension: Secondary | ICD-10-CM | POA: Diagnosis not present

## 2018-08-11 DIAGNOSIS — E1165 Type 2 diabetes mellitus with hyperglycemia: Secondary | ICD-10-CM | POA: Diagnosis not present

## 2018-10-18 DIAGNOSIS — J069 Acute upper respiratory infection, unspecified: Secondary | ICD-10-CM | POA: Diagnosis not present

## 2018-10-18 DIAGNOSIS — Z299 Encounter for prophylactic measures, unspecified: Secondary | ICD-10-CM | POA: Diagnosis not present

## 2018-10-18 DIAGNOSIS — E1165 Type 2 diabetes mellitus with hyperglycemia: Secondary | ICD-10-CM | POA: Diagnosis not present

## 2018-10-18 DIAGNOSIS — Z87891 Personal history of nicotine dependence: Secondary | ICD-10-CM | POA: Diagnosis not present

## 2018-10-18 DIAGNOSIS — I10 Essential (primary) hypertension: Secondary | ICD-10-CM | POA: Diagnosis not present

## 2018-11-02 DIAGNOSIS — E1165 Type 2 diabetes mellitus with hyperglycemia: Secondary | ICD-10-CM | POA: Diagnosis not present

## 2018-11-02 DIAGNOSIS — E7800 Pure hypercholesterolemia, unspecified: Secondary | ICD-10-CM | POA: Diagnosis not present

## 2018-11-02 DIAGNOSIS — Z299 Encounter for prophylactic measures, unspecified: Secondary | ICD-10-CM | POA: Diagnosis not present

## 2018-11-02 DIAGNOSIS — J320 Chronic maxillary sinusitis: Secondary | ICD-10-CM | POA: Diagnosis not present

## 2018-11-02 DIAGNOSIS — I10 Essential (primary) hypertension: Secondary | ICD-10-CM | POA: Diagnosis not present

## 2018-11-20 DIAGNOSIS — Z299 Encounter for prophylactic measures, unspecified: Secondary | ICD-10-CM | POA: Diagnosis not present

## 2018-11-20 DIAGNOSIS — E1165 Type 2 diabetes mellitus with hyperglycemia: Secondary | ICD-10-CM | POA: Diagnosis not present

## 2018-11-20 DIAGNOSIS — I10 Essential (primary) hypertension: Secondary | ICD-10-CM | POA: Diagnosis not present

## 2018-11-20 DIAGNOSIS — N481 Balanitis: Secondary | ICD-10-CM | POA: Diagnosis not present

## 2018-11-20 DIAGNOSIS — Z6827 Body mass index (BMI) 27.0-27.9, adult: Secondary | ICD-10-CM | POA: Diagnosis not present

## 2018-11-24 DIAGNOSIS — Z79899 Other long term (current) drug therapy: Secondary | ICD-10-CM | POA: Diagnosis not present

## 2018-11-24 DIAGNOSIS — E7800 Pure hypercholesterolemia, unspecified: Secondary | ICD-10-CM | POA: Diagnosis not present

## 2018-11-24 DIAGNOSIS — Z299 Encounter for prophylactic measures, unspecified: Secondary | ICD-10-CM | POA: Diagnosis not present

## 2018-11-24 DIAGNOSIS — Z0000 Encounter for general adult medical examination without abnormal findings: Secondary | ICD-10-CM | POA: Diagnosis not present

## 2018-11-24 DIAGNOSIS — Z1339 Encounter for screening examination for other mental health and behavioral disorders: Secondary | ICD-10-CM | POA: Diagnosis not present

## 2018-11-24 DIAGNOSIS — R5383 Other fatigue: Secondary | ICD-10-CM | POA: Diagnosis not present

## 2018-11-24 DIAGNOSIS — Z6826 Body mass index (BMI) 26.0-26.9, adult: Secondary | ICD-10-CM | POA: Diagnosis not present

## 2018-11-24 DIAGNOSIS — I10 Essential (primary) hypertension: Secondary | ICD-10-CM | POA: Diagnosis not present

## 2018-11-24 DIAGNOSIS — Z1331 Encounter for screening for depression: Secondary | ICD-10-CM | POA: Diagnosis not present

## 2018-11-24 DIAGNOSIS — Z125 Encounter for screening for malignant neoplasm of prostate: Secondary | ICD-10-CM | POA: Diagnosis not present

## 2018-11-24 DIAGNOSIS — Z7189 Other specified counseling: Secondary | ICD-10-CM | POA: Diagnosis not present

## 2018-11-28 ENCOUNTER — Encounter (INDEPENDENT_AMBULATORY_CARE_PROVIDER_SITE_OTHER): Payer: Self-pay | Admitting: *Deleted

## 2018-12-02 DIAGNOSIS — M545 Low back pain: Secondary | ICD-10-CM | POA: Diagnosis not present

## 2018-12-02 DIAGNOSIS — Z7982 Long term (current) use of aspirin: Secondary | ICD-10-CM | POA: Diagnosis not present

## 2018-12-02 DIAGNOSIS — Z79899 Other long term (current) drug therapy: Secondary | ICD-10-CM | POA: Diagnosis not present

## 2018-12-02 DIAGNOSIS — Z794 Long term (current) use of insulin: Secondary | ICD-10-CM | POA: Diagnosis not present

## 2018-12-02 DIAGNOSIS — M5441 Lumbago with sciatica, right side: Secondary | ICD-10-CM | POA: Diagnosis not present

## 2018-12-02 DIAGNOSIS — M47817 Spondylosis without myelopathy or radiculopathy, lumbosacral region: Secondary | ICD-10-CM | POA: Diagnosis not present

## 2018-12-02 DIAGNOSIS — M5442 Lumbago with sciatica, left side: Secondary | ICD-10-CM | POA: Diagnosis not present

## 2018-12-07 DIAGNOSIS — Z6827 Body mass index (BMI) 27.0-27.9, adult: Secondary | ICD-10-CM | POA: Diagnosis not present

## 2018-12-07 DIAGNOSIS — Z299 Encounter for prophylactic measures, unspecified: Secondary | ICD-10-CM | POA: Diagnosis not present

## 2018-12-07 DIAGNOSIS — I10 Essential (primary) hypertension: Secondary | ICD-10-CM | POA: Diagnosis not present

## 2018-12-07 DIAGNOSIS — E1165 Type 2 diabetes mellitus with hyperglycemia: Secondary | ICD-10-CM | POA: Diagnosis not present

## 2018-12-07 DIAGNOSIS — M545 Low back pain: Secondary | ICD-10-CM | POA: Diagnosis not present

## 2018-12-07 DIAGNOSIS — E7800 Pure hypercholesterolemia, unspecified: Secondary | ICD-10-CM | POA: Diagnosis not present

## 2018-12-14 DIAGNOSIS — I10 Essential (primary) hypertension: Secondary | ICD-10-CM | POA: Diagnosis not present

## 2019-01-15 DIAGNOSIS — I10 Essential (primary) hypertension: Secondary | ICD-10-CM | POA: Diagnosis not present

## 2019-01-23 DIAGNOSIS — E1165 Type 2 diabetes mellitus with hyperglycemia: Secondary | ICD-10-CM | POA: Diagnosis not present

## 2019-01-23 DIAGNOSIS — L97909 Non-pressure chronic ulcer of unspecified part of unspecified lower leg with unspecified severity: Secondary | ICD-10-CM | POA: Diagnosis not present

## 2019-01-23 DIAGNOSIS — Z299 Encounter for prophylactic measures, unspecified: Secondary | ICD-10-CM | POA: Diagnosis not present

## 2019-01-23 DIAGNOSIS — I10 Essential (primary) hypertension: Secondary | ICD-10-CM | POA: Diagnosis not present

## 2019-01-23 DIAGNOSIS — E11622 Type 2 diabetes mellitus with other skin ulcer: Secondary | ICD-10-CM | POA: Diagnosis not present

## 2019-01-23 DIAGNOSIS — Z6827 Body mass index (BMI) 27.0-27.9, adult: Secondary | ICD-10-CM | POA: Diagnosis not present

## 2019-01-24 ENCOUNTER — Encounter (INDEPENDENT_AMBULATORY_CARE_PROVIDER_SITE_OTHER): Payer: Self-pay | Admitting: *Deleted

## 2019-02-02 DIAGNOSIS — Z6827 Body mass index (BMI) 27.0-27.9, adult: Secondary | ICD-10-CM | POA: Diagnosis not present

## 2019-02-02 DIAGNOSIS — I10 Essential (primary) hypertension: Secondary | ICD-10-CM | POA: Diagnosis not present

## 2019-02-02 DIAGNOSIS — E11622 Type 2 diabetes mellitus with other skin ulcer: Secondary | ICD-10-CM | POA: Diagnosis not present

## 2019-02-02 DIAGNOSIS — Z299 Encounter for prophylactic measures, unspecified: Secondary | ICD-10-CM | POA: Diagnosis not present

## 2019-02-02 DIAGNOSIS — L97909 Non-pressure chronic ulcer of unspecified part of unspecified lower leg with unspecified severity: Secondary | ICD-10-CM | POA: Diagnosis not present

## 2019-02-02 DIAGNOSIS — E1165 Type 2 diabetes mellitus with hyperglycemia: Secondary | ICD-10-CM | POA: Diagnosis not present

## 2019-02-08 DIAGNOSIS — I10 Essential (primary) hypertension: Secondary | ICD-10-CM | POA: Diagnosis not present

## 2019-02-13 DIAGNOSIS — Z23 Encounter for immunization: Secondary | ICD-10-CM | POA: Diagnosis not present

## 2019-02-13 DIAGNOSIS — Z299 Encounter for prophylactic measures, unspecified: Secondary | ICD-10-CM | POA: Diagnosis not present

## 2019-02-13 DIAGNOSIS — Z6827 Body mass index (BMI) 27.0-27.9, adult: Secondary | ICD-10-CM | POA: Diagnosis not present

## 2019-02-13 DIAGNOSIS — L97909 Non-pressure chronic ulcer of unspecified part of unspecified lower leg with unspecified severity: Secondary | ICD-10-CM | POA: Diagnosis not present

## 2019-02-13 DIAGNOSIS — Z87891 Personal history of nicotine dependence: Secondary | ICD-10-CM | POA: Diagnosis not present

## 2019-02-13 DIAGNOSIS — I10 Essential (primary) hypertension: Secondary | ICD-10-CM | POA: Diagnosis not present

## 2019-02-13 DIAGNOSIS — E11622 Type 2 diabetes mellitus with other skin ulcer: Secondary | ICD-10-CM | POA: Diagnosis not present

## 2019-02-19 DIAGNOSIS — E1165 Type 2 diabetes mellitus with hyperglycemia: Secondary | ICD-10-CM | POA: Diagnosis not present

## 2019-02-19 DIAGNOSIS — Z6827 Body mass index (BMI) 27.0-27.9, adult: Secondary | ICD-10-CM | POA: Diagnosis not present

## 2019-02-19 DIAGNOSIS — E11622 Type 2 diabetes mellitus with other skin ulcer: Secondary | ICD-10-CM | POA: Diagnosis not present

## 2019-02-19 DIAGNOSIS — L97909 Non-pressure chronic ulcer of unspecified part of unspecified lower leg with unspecified severity: Secondary | ICD-10-CM | POA: Diagnosis not present

## 2019-02-19 DIAGNOSIS — Z299 Encounter for prophylactic measures, unspecified: Secondary | ICD-10-CM | POA: Diagnosis not present

## 2019-02-19 DIAGNOSIS — I10 Essential (primary) hypertension: Secondary | ICD-10-CM | POA: Diagnosis not present

## 2019-03-05 DIAGNOSIS — Z299 Encounter for prophylactic measures, unspecified: Secondary | ICD-10-CM | POA: Diagnosis not present

## 2019-03-05 DIAGNOSIS — E11622 Type 2 diabetes mellitus with other skin ulcer: Secondary | ICD-10-CM | POA: Diagnosis not present

## 2019-03-05 DIAGNOSIS — Z6827 Body mass index (BMI) 27.0-27.9, adult: Secondary | ICD-10-CM | POA: Diagnosis not present

## 2019-03-05 DIAGNOSIS — L97909 Non-pressure chronic ulcer of unspecified part of unspecified lower leg with unspecified severity: Secondary | ICD-10-CM | POA: Diagnosis not present

## 2019-03-05 DIAGNOSIS — I10 Essential (primary) hypertension: Secondary | ICD-10-CM | POA: Diagnosis not present

## 2019-03-05 DIAGNOSIS — E1165 Type 2 diabetes mellitus with hyperglycemia: Secondary | ICD-10-CM | POA: Diagnosis not present

## 2019-03-15 DIAGNOSIS — I10 Essential (primary) hypertension: Secondary | ICD-10-CM | POA: Diagnosis not present

## 2019-03-23 DIAGNOSIS — R600 Localized edema: Secondary | ICD-10-CM | POA: Diagnosis not present

## 2019-03-23 DIAGNOSIS — E1165 Type 2 diabetes mellitus with hyperglycemia: Secondary | ICD-10-CM | POA: Diagnosis not present

## 2019-03-23 DIAGNOSIS — Z6827 Body mass index (BMI) 27.0-27.9, adult: Secondary | ICD-10-CM | POA: Diagnosis not present

## 2019-03-23 DIAGNOSIS — I10 Essential (primary) hypertension: Secondary | ICD-10-CM | POA: Diagnosis not present

## 2019-03-23 DIAGNOSIS — Z299 Encounter for prophylactic measures, unspecified: Secondary | ICD-10-CM | POA: Diagnosis not present

## 2019-04-17 DIAGNOSIS — Z299 Encounter for prophylactic measures, unspecified: Secondary | ICD-10-CM | POA: Diagnosis not present

## 2019-04-17 DIAGNOSIS — Z6827 Body mass index (BMI) 27.0-27.9, adult: Secondary | ICD-10-CM | POA: Diagnosis not present

## 2019-04-17 DIAGNOSIS — M7989 Other specified soft tissue disorders: Secondary | ICD-10-CM | POA: Diagnosis not present

## 2019-04-17 DIAGNOSIS — E1165 Type 2 diabetes mellitus with hyperglycemia: Secondary | ICD-10-CM | POA: Diagnosis not present

## 2019-04-17 DIAGNOSIS — I10 Essential (primary) hypertension: Secondary | ICD-10-CM | POA: Diagnosis not present

## 2019-05-14 DIAGNOSIS — I10 Essential (primary) hypertension: Secondary | ICD-10-CM | POA: Diagnosis not present

## 2019-06-11 DIAGNOSIS — Z299 Encounter for prophylactic measures, unspecified: Secondary | ICD-10-CM | POA: Diagnosis not present

## 2019-06-11 DIAGNOSIS — Z6827 Body mass index (BMI) 27.0-27.9, adult: Secondary | ICD-10-CM | POA: Diagnosis not present

## 2019-06-11 DIAGNOSIS — Z87891 Personal history of nicotine dependence: Secondary | ICD-10-CM | POA: Diagnosis not present

## 2019-06-11 DIAGNOSIS — E11622 Type 2 diabetes mellitus with other skin ulcer: Secondary | ICD-10-CM | POA: Diagnosis not present

## 2019-06-11 DIAGNOSIS — L97909 Non-pressure chronic ulcer of unspecified part of unspecified lower leg with unspecified severity: Secondary | ICD-10-CM | POA: Diagnosis not present

## 2019-06-11 DIAGNOSIS — E119 Type 2 diabetes mellitus without complications: Secondary | ICD-10-CM | POA: Diagnosis not present

## 2019-06-11 DIAGNOSIS — I10 Essential (primary) hypertension: Secondary | ICD-10-CM | POA: Diagnosis not present

## 2019-06-11 DIAGNOSIS — E1165 Type 2 diabetes mellitus with hyperglycemia: Secondary | ICD-10-CM | POA: Diagnosis not present

## 2019-06-15 DIAGNOSIS — I10 Essential (primary) hypertension: Secondary | ICD-10-CM | POA: Diagnosis not present

## 2019-07-15 DIAGNOSIS — I10 Essential (primary) hypertension: Secondary | ICD-10-CM | POA: Diagnosis not present

## 2019-08-09 DIAGNOSIS — Z299 Encounter for prophylactic measures, unspecified: Secondary | ICD-10-CM | POA: Diagnosis not present

## 2019-08-09 DIAGNOSIS — I10 Essential (primary) hypertension: Secondary | ICD-10-CM | POA: Diagnosis not present

## 2019-08-09 DIAGNOSIS — E7800 Pure hypercholesterolemia, unspecified: Secondary | ICD-10-CM | POA: Diagnosis not present

## 2019-08-09 DIAGNOSIS — K219 Gastro-esophageal reflux disease without esophagitis: Secondary | ICD-10-CM | POA: Diagnosis not present

## 2019-08-09 DIAGNOSIS — R5383 Other fatigue: Secondary | ICD-10-CM | POA: Diagnosis not present

## 2019-08-09 DIAGNOSIS — E1165 Type 2 diabetes mellitus with hyperglycemia: Secondary | ICD-10-CM | POA: Diagnosis not present

## 2019-08-09 DIAGNOSIS — Z79899 Other long term (current) drug therapy: Secondary | ICD-10-CM | POA: Diagnosis not present

## 2019-08-14 DIAGNOSIS — I10 Essential (primary) hypertension: Secondary | ICD-10-CM | POA: Diagnosis not present

## 2019-09-14 DIAGNOSIS — E1165 Type 2 diabetes mellitus with hyperglycemia: Secondary | ICD-10-CM | POA: Diagnosis not present

## 2019-09-14 DIAGNOSIS — I10 Essential (primary) hypertension: Secondary | ICD-10-CM | POA: Diagnosis not present

## 2019-09-20 DIAGNOSIS — E1165 Type 2 diabetes mellitus with hyperglycemia: Secondary | ICD-10-CM | POA: Diagnosis not present

## 2019-09-20 DIAGNOSIS — Z299 Encounter for prophylactic measures, unspecified: Secondary | ICD-10-CM | POA: Diagnosis not present

## 2019-09-20 DIAGNOSIS — I10 Essential (primary) hypertension: Secondary | ICD-10-CM | POA: Diagnosis not present

## 2019-10-02 DIAGNOSIS — Z299 Encounter for prophylactic measures, unspecified: Secondary | ICD-10-CM | POA: Diagnosis not present

## 2019-10-02 DIAGNOSIS — E11622 Type 2 diabetes mellitus with other skin ulcer: Secondary | ICD-10-CM | POA: Diagnosis not present

## 2019-10-02 DIAGNOSIS — K921 Melena: Secondary | ICD-10-CM | POA: Diagnosis not present

## 2019-10-02 DIAGNOSIS — L97909 Non-pressure chronic ulcer of unspecified part of unspecified lower leg with unspecified severity: Secondary | ICD-10-CM | POA: Diagnosis not present

## 2019-10-02 DIAGNOSIS — I10 Essential (primary) hypertension: Secondary | ICD-10-CM | POA: Diagnosis not present

## 2019-10-14 DIAGNOSIS — I10 Essential (primary) hypertension: Secondary | ICD-10-CM | POA: Diagnosis not present

## 2019-10-14 DIAGNOSIS — E1165 Type 2 diabetes mellitus with hyperglycemia: Secondary | ICD-10-CM | POA: Diagnosis not present

## 2019-11-14 DIAGNOSIS — I10 Essential (primary) hypertension: Secondary | ICD-10-CM | POA: Diagnosis not present

## 2019-11-14 DIAGNOSIS — E1165 Type 2 diabetes mellitus with hyperglycemia: Secondary | ICD-10-CM | POA: Diagnosis not present

## 2019-11-23 DIAGNOSIS — E1165 Type 2 diabetes mellitus with hyperglycemia: Secondary | ICD-10-CM | POA: Diagnosis not present

## 2019-11-23 DIAGNOSIS — Z299 Encounter for prophylactic measures, unspecified: Secondary | ICD-10-CM | POA: Diagnosis not present

## 2019-11-23 DIAGNOSIS — D492 Neoplasm of unspecified behavior of bone, soft tissue, and skin: Secondary | ICD-10-CM | POA: Diagnosis not present

## 2019-11-23 DIAGNOSIS — D692 Other nonthrombocytopenic purpura: Secondary | ICD-10-CM | POA: Diagnosis not present

## 2019-11-23 DIAGNOSIS — I10 Essential (primary) hypertension: Secondary | ICD-10-CM | POA: Diagnosis not present

## 2019-11-28 ENCOUNTER — Ambulatory Visit (INDEPENDENT_AMBULATORY_CARE_PROVIDER_SITE_OTHER): Payer: Medicare HMO | Admitting: Gastroenterology

## 2019-11-28 ENCOUNTER — Encounter (INDEPENDENT_AMBULATORY_CARE_PROVIDER_SITE_OTHER): Payer: Self-pay | Admitting: Gastroenterology

## 2019-11-28 ENCOUNTER — Other Ambulatory Visit (INDEPENDENT_AMBULATORY_CARE_PROVIDER_SITE_OTHER): Payer: Self-pay | Admitting: *Deleted

## 2019-11-28 ENCOUNTER — Encounter (INDEPENDENT_AMBULATORY_CARE_PROVIDER_SITE_OTHER): Payer: Self-pay | Admitting: *Deleted

## 2019-11-28 ENCOUNTER — Telehealth (INDEPENDENT_AMBULATORY_CARE_PROVIDER_SITE_OTHER): Payer: Self-pay | Admitting: *Deleted

## 2019-11-28 ENCOUNTER — Other Ambulatory Visit: Payer: Self-pay

## 2019-11-28 DIAGNOSIS — K625 Hemorrhage of anus and rectum: Secondary | ICD-10-CM

## 2019-11-28 DIAGNOSIS — R195 Other fecal abnormalities: Secondary | ICD-10-CM | POA: Insufficient documentation

## 2019-11-28 MED ORDER — PLENVU 140 G PO SOLR: 1.0000 | Freq: Once | ORAL | 0 refills | Status: AC

## 2019-11-28 NOTE — Progress Notes (Signed)
Derek Bond, M.D. Gastroenterology & Hepatology [facility] [address] [place], [place] [address]  [place] [id number]  Referring MD: Dr. Jerene Bears  I will communicate my assessment and recommendations to the referring MD via EMR. Note: Occasional unusual wording and randomly placed punctuation marks may result from the use of speech recognition technology to transcribe this document"  Chief Complaint:  Rectal bleeding  History of Present Illness: Derek Bond is a 74 y.o. male with PMH mild nonobstructive coronary artery disease, history of melanoma s/p resection 24 years ago, hypertension and type 2 diabetes, who presents for evaluation of rectal bleeding.  Patient reports that a month ago he had an episode of rectal bleeding on top of his stool. He also felt he had some dyschezia at that time and was constipated.   States he had some episodes of watery diarrhea for the last year. States after his PCP changed some medicines the stool improves, still is now loose. Does not take fiber. Has 1-2 BMs per day.   Of note, the patient had a Lexiscan performed on 02/12/2016 which showed no presence of ST segment alterations, there was a small mild intensity partially reversible mid to basal inferior defect suggestive soft tissue attenuation but no large ischemic sounds were noted.  Ejection fraction was 55%.    Importantly, the patient had a negative FOBT on Oct 02, 2019 per office notes.  The patient denies having any nausea, vomiting, fever, chills, hematochezia, melena, hematemesis, abdominal distention, abdominal pain, diarrhea, jaundice, pruritus. Lost a few pounds after intentionally changing his diet. Denies any SOB,   Last Colonoscopy: 10 years ago, normal per patient.  FHx: neg for any gastrointestinal/liver disease, father had cancer but unknown type Social: former smoking but quit 30 years ago, neg alcohol or  illicit drug use Surgical: umbilical hernia  Past Medical History: Past Medical History:  Diagnosis Date  . CAD (coronary artery disease)    Mild nonobstructive disease at cardiac catheterization 2009  . Cancer ([place])    Melanoma  . Essential hypertension   . 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 Hiatal hernia   . History of melanoma   . Obstructive sleep apnea   . Type 2 diabetes mellitus ([place])     Past Surgical History: Past Surgical History:  Procedure Laterality Date  . MELANOMA EXCISION     Left facial region  . REPLACEMENT TOTAL KNEE Left   . UMBILICAL HERNIA REPAIR      Family History: Family History  Problem Relation Age of Onset  . Diabetes Mother        Died in a house fire  . Lung cancer Father   . Hypertension Other   . Diabetes Other   . Rheum arthritis Other   . Stroke Other     Social History: Social History   Tobacco Use  Smoking Status Former Smoker  . Quit date: 05/17/1993  . Years since quitting: 26.5  Smokeless Tobacco Never Used   Social History   Substance and Sexual Activity  Alcohol Use No   Social History   Substance and Sexual Activity  Drug Use No    Allergies: No Known Allergies  Medications: Current Outpatient Medications  Medication Sig Dispense Refill  . amLODipine (NORVASC) 5 MG tablet Take 5 mg by mouth daily.    Marland Kitchen aspirin EC 81 MG tablet Take 81 mg  by mouth daily.    . diclofenac (VOLTAREN) 75 MG EC tablet Take 75 mg by mouth 2 (two) times daily.    Marland Kitchen glipiZIDE (GLUCOTROL XL) 10 MG 24 hr tablet Take 10 mg by mouth 2 (two) times daily.    . Insulin Degludec (TRESIBA FLEXTOUCH [place]) Inject 46 Units into the skin.    Marland Kitchen losartan (COZAAR) 100 MG tablet Take 100 mg by mouth daily.    . metFORMIN (GLUCOPHAGE) 1000 MG tablet Take 1,000 mg by mouth 2 (two) times daily with a meal.    . simvastatin (ZOCOR) 40 MG tablet Take 40 mg by mouth daily.     No current facility-administered medications for  this visit.    Review of Systems: GENERAL: negative for malaise, significant weight loss, night sweats and fever HEENT: No changes in hearing or vision, no nose bleeds or other nasal problems. No trouble swallowing NECK: Negative for lumps, goiter, pain and significant neck swelling RESPIRATORY: Negative for cough, wheezing and shortness of breath CARDIOVASCULAR: Negative for chest pain, leg swelling, palpitations, orthopnea GI: SEE HPI MUSCULOSKELETAL: Negative for joint pain or swelling, back pain, and muscle pain. SKIN: Negative for lesions, rash, and itching. PSYCH: Negative for sleep disturbance, mood disorder and recent psychosocial stressors. HEMATOLOGY Negative for prolonged bleeding, bruising easily, and swollen nodes. ENDOCRINE: Negative for cold or heat intolerance, polyuria, polydipsia and goiter. NEURO: negative for lightheadedness, dizziness, tremor, gait imbalance, syncope and seizures. The remainder of the review of systems is noncontributory.   Physical Exam: BP (!) 190/77 (BP Location: Right Arm, Patient Position: Sitting, Cuff Size: Normal)   Pulse 70   Temp 98 F (36.7 C) (Oral)   Ht 6\' 6"  (1.981 m)   Wt 221 lb 8 oz (100.5 kg)   BMI 25.60 kg/m  GENERAL: The patient is AO x3, in no acute distress. HEENT: Head is normocephalic and atraumatic. EOMI are intact. Mouth is well hydrated and without lesions. NECK: Supple. No masses LUNGS: Clear to auscultation. No presence of rhonchi/wheezing/rales. Adequate chest expansion HEART: RRR, normal s1 and s2. ABDOMEN: Soft, nontender, no guarding, no peritoneal signs, and nondistended. BS +. No masses. EXTREMITIES: Without any cyanosis, clubbing, rash, lesions or edema. NEUROLOGIC: AOx3, no focal motor deficit. SKIN: no jaundice, no rashes   Imaging/Labs: as above  I personally reviewed and interpreted the available labs, imaging and endoscopic files.  Impression and Plan: [name] is a 74 y.o. male with PMH  mild nonobstructive coronary artery disease, history of melanoma s/p resection 24 years ago, hypertension and type 2 diabetes, who presents for evaluation of rectal bleeding.  The patient states that he has presented only one episode of rectal bleeding along with transient constipation dyschezia, which is very typical for hemorrhoidal/fissure bleeding, but he has not presented any repeat bleeding episodes since then and he had a negative FOBT.  Regardless, he would require a screening colonoscopy as his last procedure was 10 years ago.  He is no presented any anginal equivalents and had negative stress test 4 years ago. More than 50% of the office visit was dedicated to discussing the procedure, including the day of and risks involved. Patient understands what the procedure involves including the benefits and any risks. Patient understands alternatives to the proposed procedure. Risks including (but not limited to) bleeding, tearing of the lining (perforation), rupture of adjacent organs, problems with heart and lung function, infection, and medication reactions. A small percentage of complications may require surgery, hospitalization, repeat endoscopic procedure, and/or  transfusion. A small percentage of polyps and other tumors may not be seen.  Finally, the patient has presented some loose bowel movements that have not affected his lifestyle significantly but could improve in consistency if he takes Benefiber daily.  -Schedule colonoscopy -Start Benefiber fiber supplements daily to increase stool bulk  All questions were answered.      Harvel Quale, MD Gastroenterology and Hepatology [facility]

## 2019-11-28 NOTE — Telephone Encounter (Signed)
Patient needs Plenvu (copay card) ° °

## 2019-11-28 NOTE — Patient Instructions (Signed)
Schedule colonoscopy Start Benefiber fiber supplements daily to increase stool bulk

## 2019-12-06 DIAGNOSIS — Z125 Encounter for screening for malignant neoplasm of prostate: Secondary | ICD-10-CM | POA: Diagnosis not present

## 2019-12-06 DIAGNOSIS — R5383 Other fatigue: Secondary | ICD-10-CM | POA: Diagnosis not present

## 2019-12-06 DIAGNOSIS — Z0000 Encounter for general adult medical examination without abnormal findings: Secondary | ICD-10-CM | POA: Diagnosis not present

## 2019-12-06 DIAGNOSIS — Z1331 Encounter for screening for depression: Secondary | ICD-10-CM | POA: Diagnosis not present

## 2019-12-06 DIAGNOSIS — I10 Essential (primary) hypertension: Secondary | ICD-10-CM | POA: Diagnosis not present

## 2019-12-06 DIAGNOSIS — Z7189 Other specified counseling: Secondary | ICD-10-CM | POA: Diagnosis not present

## 2019-12-06 DIAGNOSIS — E7800 Pure hypercholesterolemia, unspecified: Secondary | ICD-10-CM | POA: Diagnosis not present

## 2019-12-06 DIAGNOSIS — Z79899 Other long term (current) drug therapy: Secondary | ICD-10-CM | POA: Diagnosis not present

## 2019-12-06 DIAGNOSIS — Z299 Encounter for prophylactic measures, unspecified: Secondary | ICD-10-CM | POA: Diagnosis not present

## 2019-12-06 DIAGNOSIS — Z1339 Encounter for screening examination for other mental health and behavioral disorders: Secondary | ICD-10-CM | POA: Diagnosis not present

## 2019-12-06 DIAGNOSIS — Z1211 Encounter for screening for malignant neoplasm of colon: Secondary | ICD-10-CM | POA: Diagnosis not present

## 2019-12-06 DIAGNOSIS — Z6826 Body mass index (BMI) 26.0-26.9, adult: Secondary | ICD-10-CM | POA: Diagnosis not present

## 2019-12-11 DIAGNOSIS — M79605 Pain in left leg: Secondary | ICD-10-CM | POA: Diagnosis not present

## 2019-12-11 DIAGNOSIS — E1159 Type 2 diabetes mellitus with other circulatory complications: Secondary | ICD-10-CM | POA: Diagnosis not present

## 2019-12-11 DIAGNOSIS — E1143 Type 2 diabetes mellitus with diabetic autonomic (poly)neuropathy: Secondary | ICD-10-CM | POA: Diagnosis not present

## 2019-12-11 DIAGNOSIS — M79604 Pain in right leg: Secondary | ICD-10-CM | POA: Diagnosis not present

## 2019-12-14 DIAGNOSIS — E1165 Type 2 diabetes mellitus with hyperglycemia: Secondary | ICD-10-CM | POA: Diagnosis not present

## 2019-12-14 DIAGNOSIS — I10 Essential (primary) hypertension: Secondary | ICD-10-CM | POA: Diagnosis not present

## 2019-12-17 NOTE — Patient Instructions (Signed)
Your procedure is scheduled on: 12/21/2019  Report to [facility] at  7:45   AM.  Call this number if you have problems the morning of surgery: [phone]   Remember:              Follow Directions on the letter you received from Your Physician's office regarding the Bowel Prep              No Smoking the day of Procedure :   Take these medicines the morning of surgery with A SIP OF WATER: amlodipine and Candesartan              No diabetic medication am of procedure   Do not wear jewelry, make-up or nail polish.    Do not bring valuables to the hospital.  Contacts, dentures or bridgework may not be worn into surgery.  .   Patients discharged the day of surgery will not be allowed to drive home.     Colonoscopy, Adult, Care After This sheet gives you information about how to care for yourself after your procedure. Your health care provider may also give you more specific instructions. If you have problems or questions, contact your health care provider. What can I expect after the procedure? After the procedure, it is common to have:  A small amount of blood in your stool for 24 hours after the procedure.  Some gas.  Mild abdominal cramping or bloating.  Follow these instructions at home: General instructions   For the first 24 hours after the procedure: ? Do not drive or use machinery. ? Do not sign important documents. ? Do not drink alcohol. ? Do your regular daily activities at a slower pace than normal. ? Eat soft, easy-to-digest foods. ? Rest often.  Take over-the-counter or prescription medicines only as told by your health care provider.  It is up to you to get the results of your procedure. Ask your health care provider, or the department performing the procedure, when your results will be ready. Relieving cramping and bloating  Try walking around when you have cramps or feel bloated.  Apply heat to your abdomen as told by your health care provider. Use  a heat source that your health care provider recommends, such as a moist heat pack or a heating pad. ? Place a towel between your skin and the heat source. ? Leave the heat on for 20-30 minutes. ? Remove the heat if your skin turns bright red. This is especially important if you are unable to feel pain, heat, or cold. You may have a greater risk of getting burned. Eating and drinking  Drink enough fluid to keep your urine clear or pale yellow.  Resume your normal diet as instructed by your health care provider. Avoid heavy or fried foods that are hard to digest.  Avoid drinking alcohol for as long as instructed by your health care provider. Contact a health care provider if:  You have blood in your stool 2-3 days after the procedure. Get help right away if:  You have more than a small spotting of blood in your stool.  You pass large blood clots in your stool.  Your abdomen is swollen.  You have nausea or vomiting.  You have a fever.  You have increasing abdominal pain that is not relieved with medicine. This information is not intended to replace advice given to you by your health care provider. Make sure you discuss any questions you have with your  health care provider. Document Released: 12/16/2003 Document Revised: 01/26/2016 Document Reviewed: 07/15/2015 Elsevier Interactive Patient Education  [employer].

## 2019-12-19 ENCOUNTER — Other Ambulatory Visit: Payer: Self-pay

## 2019-12-19 ENCOUNTER — Encounter (HOSPITAL_COMMUNITY)
Admission: RE | Admit: 2019-12-19 | Discharge: 2019-12-19 | Disposition: A | Discharge status: Home / Self Care | Payer: Medicare PPO | Source: Ambulatory Visit | Attending: Gastroenterology | Admitting: Gastroenterology

## 2019-12-19 ENCOUNTER — Encounter (HOSPITAL_COMMUNITY): Payer: Self-pay

## 2019-12-19 ENCOUNTER — Other Ambulatory Visit (HOSPITAL_COMMUNITY)
Admission: RE | Admit: 2019-12-19 | Discharge: 2019-12-19 | Disposition: A | Discharge status: Home / Self Care | Payer: Medicare PPO | Source: Ambulatory Visit | Attending: Gastroenterology | Admitting: Gastroenterology

## 2019-12-19 DIAGNOSIS — Z01818 Encounter for other preprocedural examination: Secondary | ICD-10-CM | POA: Insufficient documentation

## 2019-12-19 DIAGNOSIS — Z20822 Contact with and (suspected) exposure to covid-19: Secondary | ICD-10-CM | POA: Diagnosis not present

## 2019-12-19 LAB — BASIC METABOLIC PANEL
Anion gap: 11 (ref 5–15)
BUN: 14 mg/dL (ref 8–23)
CO2: 23 mmol/L (ref 22–32)
Calcium: 9.5 mg/dL (ref 8.9–10.3)
Chloride: 103 mmol/L (ref 98–111)
Creatinine, Ser: 0.83 mg/dL (ref 0.61–1.24)
GFR calc Af Amer: >60 mL/min (ref >60)
GFR calc non Af Amer: >60 mL/min (ref >60)
Glucose, Bld: 182 mg/dL — ABNORMAL HIGH (ref 70–99)
Potassium: 3.9 mmol/L (ref 3.5–5.1)
Sodium: 137 mmol/L (ref 135–145)

## 2019-12-19 LAB — SARS CORONAVIRUS 2 (TAT 6-24 HRS): SARS Coronavirus 2: NEGATIVE

## 2019-12-21 ENCOUNTER — Ambulatory Visit (HOSPITAL_COMMUNITY): Payer: Medicare PPO | Admitting: Anesthesiology

## 2019-12-21 ENCOUNTER — Encounter (HOSPITAL_COMMUNITY)
Admission: RE | Disposition: A | Discharge status: Home / Self Care | Payer: Self-pay | Source: Home / Self Care | Attending: Gastroenterology

## 2019-12-21 ENCOUNTER — Ambulatory Visit (HOSPITAL_COMMUNITY)
Admission: RE | Admit: 2019-12-21 | Discharge: 2019-12-21 | Disposition: A | Discharge status: Home / Self Care | Payer: Medicare PPO | Attending: Gastroenterology | Admitting: Gastroenterology

## 2019-12-21 DIAGNOSIS — Z85820 Personal history of malignant melanoma of skin: Secondary | ICD-10-CM | POA: Insufficient documentation

## 2019-12-21 DIAGNOSIS — I2510 Atherosclerotic heart disease of native coronary artery without angina pectoris: Secondary | ICD-10-CM | POA: Diagnosis not present

## 2019-12-21 DIAGNOSIS — K644 Residual hemorrhoidal skin tags: Secondary | ICD-10-CM | POA: Insufficient documentation

## 2019-12-21 DIAGNOSIS — Z791 Long term (current) use of non-steroidal anti-inflammatories (NSAID): Secondary | ICD-10-CM | POA: Insufficient documentation

## 2019-12-21 DIAGNOSIS — K625 Hemorrhage of anus and rectum: Secondary | ICD-10-CM | POA: Diagnosis not present

## 2019-12-21 DIAGNOSIS — E119 Type 2 diabetes mellitus without complications: Secondary | ICD-10-CM | POA: Diagnosis not present

## 2019-12-21 DIAGNOSIS — Z87891 Personal history of nicotine dependence: Secondary | ICD-10-CM | POA: Insufficient documentation

## 2019-12-21 DIAGNOSIS — Z833 Family history of diabetes mellitus: Secondary | ICD-10-CM | POA: Diagnosis not present

## 2019-12-21 DIAGNOSIS — K648 Other hemorrhoids: Secondary | ICD-10-CM | POA: Diagnosis not present

## 2019-12-21 DIAGNOSIS — R197 Diarrhea, unspecified: Secondary | ICD-10-CM | POA: Insufficient documentation

## 2019-12-21 DIAGNOSIS — Z8249 Family history of ischemic heart disease and other diseases of the circulatory system: Secondary | ICD-10-CM | POA: Diagnosis not present

## 2019-12-21 DIAGNOSIS — G4733 Obstructive sleep apnea (adult) (pediatric): Secondary | ICD-10-CM | POA: Insufficient documentation

## 2019-12-21 DIAGNOSIS — Z794 Long term (current) use of insulin: Secondary | ICD-10-CM | POA: Diagnosis not present

## 2019-12-21 DIAGNOSIS — G709 Myoneural disorder, unspecified: Secondary | ICD-10-CM | POA: Diagnosis not present

## 2019-12-21 DIAGNOSIS — Z7982 Long term (current) use of aspirin: Secondary | ICD-10-CM | POA: Insufficient documentation

## 2019-12-21 DIAGNOSIS — I10 Essential (primary) hypertension: Secondary | ICD-10-CM | POA: Insufficient documentation

## 2019-12-21 DIAGNOSIS — Z139 Encounter for screening, unspecified: Secondary | ICD-10-CM | POA: Diagnosis not present

## 2019-12-21 DIAGNOSIS — G4730 Sleep apnea, unspecified: Secondary | ICD-10-CM | POA: Insufficient documentation

## 2019-12-21 DIAGNOSIS — Z79899 Other long term (current) drug therapy: Secondary | ICD-10-CM | POA: Insufficient documentation

## 2019-12-21 HISTORY — PX: BIOPSY: SHX5522

## 2019-12-21 HISTORY — PX: COLONOSCOPY WITH PROPOFOL: SHX5780

## 2019-12-21 LAB — GLUCOSE, CAPILLARY
Glucose-Capillary: 114 mg/dL — ABNORMAL HIGH (ref 70–99)
Glucose-Capillary: 123 mg/dL — ABNORMAL HIGH (ref 70–99)

## 2019-12-21 SURGERY — COLONOSCOPY WITH PROPOFOL
Anesthesia: General

## 2019-12-21 MED ORDER — PROPOFOL 10 MG/ML IV BOLUS
INTRAVENOUS | Status: DC | PRN
  Administered 2019-12-21: 10 mg via INTRAVENOUS
  Administered 2019-12-21: 70 mg via INTRAVENOUS

## 2019-12-21 MED ORDER — PROPOFOL 500 MG/50ML IV EMUL
INTRAVENOUS | Status: DC | PRN
  Administered 2019-12-21: 50 ug/kg/min via INTRAVENOUS

## 2019-12-21 MED ORDER — STERILE WATER FOR IRRIGATION IR SOLN
Status: DC | PRN
  Administered 2019-12-21: 2.5 mL

## 2019-12-21 MED ORDER — LACTATED RINGERS IV SOLN: Freq: Once | INTRAVENOUS | Status: AC

## 2019-12-21 NOTE — Discharge Instructions (Signed)
You are being discharged to home.  Resume your previous diet.  We are waiting for your pathology results.  Your physician has indicated that a repe   Colonoscopy, Adult, Care After This sheet gives you information about how to care for yourself after your procedure. Your doctor may also give you more specific instructions. If you have problems or questions, call your doctor. What can I expect after the procedure? After the procedure, it is common to have:  A small amount of blood in your poop (stool) for 24 hours.  Some gas.  Mild cramping or bloating in your belly (abdomen). Follow these instructions at home: Eating and drinking   Drink enough fluid to keep your pee (urine) pale yellow.  Follow instructions from your doctor about what you cannot eat or drink.  Return to your normal diet as told by your doctor. Avoid heavy or fried foods that are hard to digest. Activity  Rest as told by your doctor.  Do not sit for a long time without moving. Get up to take short walks every 1-2 hours. This is important. Ask for help if you feel weak or unsteady.  Return to your normal activities as told by your doctor. Ask your doctor what activities are safe for you. To help cramping and bloating:   Try walking around.  Put heat on your belly as told by your doctor. Use the heat source that your doctor recommends, such as a moist heat pack or a heating pad. ? Put a towel between your skin and the heat source. ? Leave the heat on for 20-30 minutes. ? Remove the heat if your skin turns bright red. This is very important if you are unable to feel pain, heat, or cold. You may have a greater risk of getting burned. General instructions  For the first 24 hours after the procedure: ? Do not drive or use machinery. ? Do not sign important documents. ? Do not drink alcohol. ? Do your daily activities more slowly than normal. ? Eat foods that are soft and easy to digest.  Take  over-the-counter or prescription medicines only as told by your doctor.  Keep all follow-up visits as told by your doctor. This is important. Contact a doctor if:  You have blood in your poop 2-3 days after the procedure. Get help right away if:  You have more than a small amount of blood in your poop.  You see large clumps of tissue (blood clots) in your poop.  Your belly is swollen.  You feel like you may vomit (nauseous).  You vomit.  You have a fever.  You have belly pain that gets worse, and medicine does not help your pain. Summary  After the procedure, it is common to have a small amount of blood in your poop. You may also have mild cramping and bloating in your belly.  For the first 24 hours after the procedure, do not drive or use machinery, do not sign important documents, and do not drink alcohol.  Get help right away if you have a lot of blood in your poop, feel like you may vomit, have a fever, or have more belly pain. This information is not intended to replace advice given to you by your health care provider. Make sure you discuss any questions you have with your health care provider. Document Revised: 11/27/2018 Document Reviewed: 11/27/2018 Elsevier Patient Education  [employer]. at colonoscopy is not recommended due to your current age (66  years or older) for screening purposes.   Colon Polyps  Polyps are tissue growths inside the body. Polyps can grow in many places, including the large intestine (colon). A polyp may be a round bump or a mushroom-shaped growth. You could have one polyp or several. Most colon polyps are noncancerous (benign). However, some colon polyps can become cancerous over time. Finding and removing the polyps early can help prevent this. What are the causes? The exact cause of colon polyps is not known. What increases the risk? You are more likely to develop this condition if you:  Have a family history of colon cancer or  colon polyps.  Are older than 43 or older than 45 if you are African American.  Have inflammatory bowel disease, such as ulcerative colitis or Crohn's disease.  Have certain hereditary conditions, such as: ? Familial adenomatous polyposis. ? Lynch syndrome. ? Turcot syndrome. ? Peutz-Jeghers syndrome.  Are overweight.  Smoke cigarettes.  Do not get enough exercise.  Drink too much alcohol.  Eat a diet that is high in fat and red meat and low in fiber.  Had childhood cancer that was treated with abdominal radiation. What are the signs or symptoms? Most polyps do not cause symptoms. If you have symptoms, they may include:  Blood coming from your rectum when having a bowel movement.  Blood in your stool. The stool may look dark red or black.  Abdominal pain.  A change in bowel habits, such as constipation or diarrhea. How is this diagnosed? This condition is diagnosed with a colonoscopy. This is a procedure in which a lighted, flexible scope is inserted into the anus and then passed into the colon to examine the area. Polyps are sometimes found when a colonoscopy is done as part of routine cancer screening tests. How is this treated? Treatment for this condition involves removing any polyps that are found. Most polyps can be removed during a colonoscopy. Those polyps will then be tested for cancer. Additional treatment may be needed depending on the results of testing. Follow these instructions at home: Lifestyle  Maintain a healthy weight, or lose weight if recommended by your health care provider.  Exercise every day or as told by your health care provider.  Do not use any products that contain nicotine or tobacco, such as cigarettes and e-cigarettes. If you need help quitting, ask your health care provider.  If you drink alcohol, limit how much you have: ? 0-1 drink a day for women. ? 0-2 drinks a day for men.  Be aware of how much alcohol is in your drink. In the  [place] one drink equals one 12 oz bottle of beer (355 mL), one 5 oz glass of wine (148 mL), or one 1 oz shot of hard liquor (44 mL). Eating and drinking   Eat foods that are high in fiber, such as fruits, vegetables, and whole grains.  Eat foods that are high in calcium and vitamin D, such as milk, cheese, yogurt, eggs, liver, fish, and broccoli.  Limit foods that are high in fat, such as fried foods and desserts.  Limit the amount of red meat and processed meat you eat, such as hot dogs, sausage, bacon, and lunch meats. General instructions  Keep all follow-up visits as told by your health care provider. This is important. ? This includes having regularly scheduled colonoscopies. ? Talk to your health care provider about when you need a colonoscopy. Contact a health care provider if:  You have new  or worsening bleeding during a bowel movement.  You have new or increased blood in your stool.  You have a change in bowel habits.  You lose weight for no known reason. Summary  Polyps are tissue growths inside the body. Polyps can grow in many places, including the colon.  Most colon polyps are noncancerous (benign), but some can become cancerous over time.  This condition is diagnosed with a colonoscopy.  Treatment for this condition involves removing any polyps that are found. Most polyps can be removed during a colonoscopy. This information is not intended to replace advice given to you by your health care provider. Make sure you discuss any questions you have with your health care provider. Document Revised: 08/18/2017 Document Reviewed: 08/18/2017 Elsevier Patient Education  [employer].

## 2019-12-21 NOTE — Anesthesia Postprocedure Evaluation (Signed)
Anesthesia Post Note  Patient: Derek Bond  Procedure(s) Performed: COLONOSCOPY WITH PROPOFOL (N/A ) BIOPSY  Patient location during evaluation: PACU Anesthesia Type: General Level of consciousness: awake and alert and patient cooperative Pain management: satisfactory to patient Vital Signs Assessment: post-procedure vital signs reviewed and stable Respiratory status: spontaneous breathing Cardiovascular status: stable Postop Assessment: no apparent nausea or vomiting Anesthetic complications: no   No complications documented.   Last Vitals:  Vitals:   12/21/19 1200 12/21/19 1207  BP: (!) 151/86 (!) 167/79  Pulse: 61 72  Resp: 13 17  Temp:  36.4 C  SpO2: 98% 98%    Last Pain:  Vitals:   12/21/19 1207  TempSrc: Oral  PainSc: 0-No pain                 Lyan Moyano

## 2019-12-21 NOTE — Anesthesia Procedure Notes (Signed)
Procedure Name: MAC Date/Time: 12/21/2019 10:58 AM Performed by: Vista Deck, CRNA Pre-anesthesia Checklist: Patient identified, Emergency Drugs available, Suction available, Timeout performed and Patient being monitored Patient Re-evaluated:Patient Re-evaluated prior to induction Oxygen Delivery Method: Nasal Cannula

## 2019-12-21 NOTE — Transfer of Care (Signed)
Immediate Anesthesia Transfer of Care Note  Patient: Derek Bond  Procedure(s) Performed: COLONOSCOPY WITH PROPOFOL (N/A )  Patient Location: PACU  Anesthesia Type:General  Level of Consciousness: awake, alert  and patient cooperative  Airway & Oxygen Therapy: Patient Spontanous Breathing  Post-op Assessment: Report given to RN and Post -op Vital signs reviewed and stable  Post vital signs: Reviewed and stable  Last Vitals:  Vitals Value Taken Time  BP 125/58 12/21/19 1131  Temp 97.6   Pulse 70 12/21/19 1133  Resp 15 12/21/19 1133  SpO2 97 % 12/21/19 1133  Vitals shown include unvalidated device data.  Last Pain:  Vitals:   12/21/19 0830  PainSc: 0-No pain         Complications: No complications documented.

## 2019-12-21 NOTE — Anesthesia Preprocedure Evaluation (Signed)
Anesthesia Evaluation  Patient identified by MRN, date of birth, ID band  History of Anesthesia Complications (+) history of anesthetic complications  Airway Mallampati: II  TM Distance: >3 FB Neck ROM: Full    Dental  (+) Dental Advisory Given, Caps   Pulmonary sleep apnea , former smoker,    Pulmonary exam normal breath sounds clear to auscultation       Cardiovascular Exercise Tolerance: Good hypertension, Pt. on medications + CAD  Normal cardiovascular exam Rhythm:Regular Rate:Normal     Neuro/Psych  Neuromuscular disease    GI/Hepatic hiatal hernia,   Endo/Other  diabetes, Well Controlled, Type 2, Oral Hypoglycemic Agents, Insulin Dependent  Renal/GU      Musculoskeletal   Abdominal   Peds  Hematology   Anesthesia Other Findings   Reproductive/Obstetrics                            Anesthesia Physical Anesthesia Plan  ASA: III  Anesthesia Plan: General   Post-op Pain Management:    Induction: Intravenous  PONV Risk Score and Plan:   Airway Management Planned: Nasal Cannula, Natural Airway and Simple Face Mask  Additional Equipment:   Intra-op Plan:   Post-operative Plan:   Informed Consent:   Plan Discussed with: CRNA and Surgeon  Anesthesia Plan Comments:         Anesthesia Quick Evaluation

## 2019-12-21 NOTE — H&P (Signed)
Derek Bond is an 74 y.o. male.   Chief Complaint: rectal bleeding and positive FOBT HPI: Briefly, this is a 74 year old male with past medical history of mild nonobstructive coronary artery disease, history of melanoma s/p resection 24 years ago, hypertension and type 2 diabetes, who comes to the hospital for evaluation of episodes of rectal bleeding and positive FOBT in recent visit to his PCP.  The patient states that his last bloody bowel movement was 1 month ago.  He has not has any abdominal pain or unintentional weight loss.  He reports having watery to loose bowel movements up to 3 times per day, he believes secondary to changing his medication (Metformin).  Last Colonoscopy: 10 years ago, normal per patient.  father had cancer but unknown type  Past Medical History:  Diagnosis Date  . CAD (coronary artery disease)    Mild nonobstructive disease at cardiac catheterization 2009  . Cancer ([place])    Melanoma  . Essential hypertension   . 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 Hiatal hernia   . History of melanoma   . Obstructive sleep apnea   . Type 2 diabetes mellitus ([place])     Past Surgical History:  Procedure Laterality Date  . MELANOMA EXCISION     Left facial region  . REPLACEMENT TOTAL KNEE Left   . UMBILICAL HERNIA REPAIR      Family History  Problem Relation Age of Onset  . Diabetes Mother        Died in a house fire  . Lung cancer Father   . Hypertension Other   . Diabetes Other   . Rheum arthritis Other   . Stroke Other    Social History:  reports that he quit smoking about 26 years ago. He has never used smokeless tobacco. He reports that he does not drink alcohol and does not use drugs.  Allergies: No Known Allergies  Medications Prior to Admission  Medication Sig Dispense Refill  . amLODipine (NORVASC) 10 MG tablet Take 10 mg by mouth daily.     Marland Kitchen aspirin EC 81 MG tablet Take 81 mg by mouth daily.    . candesartan  (ATACAND) 16 MG tablet Take 16 mg by mouth daily.     . diclofenac (VOLTAREN) 75 MG EC tablet Take 75 mg by mouth 2 (two) times daily.    Marland Kitchen glipiZIDE (GLUCOTROL XL) 10 MG 24 hr tablet Take 10 mg by mouth 2 (two) times daily.    . metFORMIN (GLUCOPHAGE) 1000 MG tablet Take 1,000 mg by mouth 2 (two) times daily with a meal.    . rosuvastatin (CRESTOR) 5 MG tablet Take 5 mg by mouth every Wednesday. Patient reports that he takes daily on Wednesday     . Insulin Degludec (TRESIBA FLEXTOUCH [place]) Inject 28 Units into the skin daily.       Results for orders placed or performed during the hospital encounter of 12/21/19 (from the past 48 hour(s))  Glucose, capillary     Status: Abnormal   Collection Time: 12/21/19  8:16 AM  Result Value Ref Range   Glucose-Capillary 114 (H) 70 - 99 mg/dL    Comment: Glucose reference range applies only to samples taken after fasting for at least 8 hours.   No results found.  Review of Systems  Constitutional: Negative.   HENT: Negative.   Eyes: Negative.   Respiratory: Negative.   Cardiovascular: Negative.   Gastrointestinal:  Positive for diarrhea.  Endocrine: Negative.   Genitourinary: Negative.   Musculoskeletal: Negative.   Skin: Negative.   Allergic/Immunologic: Negative.   Neurological: Negative.   Hematological: Negative.   Psychiatric/Behavioral: Negative.     Blood pressure (!) 163/81, pulse 82, temperature (!) 97.5 F (36.4 C), resp. rate 11, SpO2 97 %. Physical Exam  GENERAL: The patient is AO x3, in no acute distress. HEENT: Head is normocephalic and atraumatic. EOMI are intact. Mouth is well hydrated and without lesions. NECK: Supple. No masses LUNGS: Clear to auscultation. No presence of rhonchi/wheezing/rales. Adequate chest expansion HEART: RRR, normal s1 and s2. ABDOMEN: Soft, nontender, no guarding, no peritoneal signs, and nondistended. BS +. No masses. EXTREMITIES: Without any cyanosis, clubbing, rash, lesions or  edema. NEUROLOGIC: AOx3, no focal motor deficit. SKIN: no jaundice, no rashes  Assessment/Plan 74 year old male with past medical history of mild nonobstructive coronary artery disease, history of melanoma s/p resection 24 years ago, hypertension and type 2 diabetes, who comes to the hospital for evaluation of episodes of rectal bleeding and positive FOBT in recent visit to his PCP.  He is at average risk for colorectal cancer. We will proceed with colonoscopy today for which the patient is due as well for colorectal cancer screening.    Harvel Quale, MD 12/21/2019, 9:53 AM

## 2019-12-21 NOTE — Op Note (Signed)
[facility] Patient Name: Derek Bond Procedure Date: 12/21/2019 10:54 AM MRN: [id number] Date of Birth: 18-Oct-1945 Attending MD: Maylon Peppers ,  CSN: [id number] Age: 74 Admit Type: Outpatient Procedure:                Colonoscopy Indications:              Clinically significant diarrhea of unexplained                            origin, Rectal bleeding Providers:                Maylon Peppers, Lurline Del, RN, Randa Spike,                            Technician Referring MD:              Medicines:                Monitored Anesthesia Care Complications:            No immediate complications. Estimated Blood Loss:     Estimated blood loss: none. Procedure:                Pre-Anesthesia Assessment:                           - Prior to the procedure, a History and Physical                            was performed, and patient medications, allergies                            and sensitivities were reviewed. The patient's                            tolerance of previous anesthesia was reviewed.                           - The risks and benefits of the procedure and the                            sedation options and risks were discussed with the                            patient. All questions were answered and informed                            consent was obtained.                           - ASA Grade Assessment: III - A patient with severe                            systemic disease.                           After obtaining informed consent, the colonoscope  was passed under direct vision. Throughout the                            procedure, the patient's blood pressure, pulse, and                            oxygen saturations were monitored continuously. The                            [id number]-[id number] ([id number]) scope was introduced through                            the anus and advanced to the the terminal ileum.                            The  colonoscopy was performed without difficulty.                            The patient tolerated the procedure well. The                            quality of the bowel preparation was excellent.                            Scope withdrawal time was 10 minutes. Scope In: 11:04:36 AM Scope Out: 11:25:12 AM Scope Withdrawal Time: 0 hours 13 minutes 26 seconds  Total Procedure Duration: 0 hours 20 minutes 36 seconds  Findings:      Skin tags were found on perianal exam.      The terminal ileum appeared normal.      The colon (entire examined portion) appeared normal. Biopsies for       histology were taken with a cold forceps from the left and right colon       for evaluation of microscopic colitis.      Non-bleeding internal hemorrhoids were found during retroflexion. The       hemorrhoids were small. Impression:               - Perianal skin tags found on perianal exam.                           - The examined portion of the ileum was normal.                           - The entire examined colon is normal. Biopsied.                           - Non-bleeding internal hemorrhoids. Moderate Sedation:      Per Anesthesia Care Recommendation:           - Discharge patient to home (ambulatory).                           - Resume previous diet.                           -  Await pathology results.                           - Repeat colonoscopy is not recommended due to                            current age (29 years or older) for screening                            purposes. Procedure Code(s):        --- Professional ---                           [phone], GC, Colonoscopy, flexible; with biopsy,                            single or multiple Diagnosis Code(s):        --- Professional ---                           K64.8, Other hemorrhoids                           K64.4, Residual hemorrhoidal skin tags                           R19.7, Diarrhea, unspecified                           K62.5,  Hemorrhage of anus and rectum CPT copyright 2019 [employer]. All rights reserved. The codes documented in this report are preliminary and upon coder review may  be revised to meet current compliance requirements. Maylon Peppers, MD Maylon Peppers,  12/21/2019 11:35:14 AM This report has been signed electronically. Number of Addenda: 0

## 2019-12-24 LAB — SURGICAL PATHOLOGY

## 2019-12-26 ENCOUNTER — Encounter (HOSPITAL_COMMUNITY): Payer: Self-pay | Admitting: Gastroenterology

## 2020-01-01 DIAGNOSIS — I10 Essential (primary) hypertension: Secondary | ICD-10-CM | POA: Diagnosis not present

## 2020-01-01 DIAGNOSIS — I739 Peripheral vascular disease, unspecified: Secondary | ICD-10-CM | POA: Diagnosis not present

## 2020-01-01 DIAGNOSIS — Z794 Long term (current) use of insulin: Secondary | ICD-10-CM | POA: Diagnosis not present

## 2020-01-01 DIAGNOSIS — Z299 Encounter for prophylactic measures, unspecified: Secondary | ICD-10-CM | POA: Diagnosis not present

## 2020-01-01 DIAGNOSIS — E1165 Type 2 diabetes mellitus with hyperglycemia: Secondary | ICD-10-CM | POA: Diagnosis not present

## 2020-01-08 DIAGNOSIS — E11622 Type 2 diabetes mellitus with other skin ulcer: Secondary | ICD-10-CM | POA: Diagnosis not present

## 2020-01-08 DIAGNOSIS — I10 Essential (primary) hypertension: Secondary | ICD-10-CM | POA: Diagnosis not present

## 2020-01-08 DIAGNOSIS — L97909 Non-pressure chronic ulcer of unspecified part of unspecified lower leg with unspecified severity: Secondary | ICD-10-CM | POA: Diagnosis not present

## 2020-01-08 DIAGNOSIS — E1165 Type 2 diabetes mellitus with hyperglycemia: Secondary | ICD-10-CM | POA: Diagnosis not present

## 2020-01-08 DIAGNOSIS — Z299 Encounter for prophylactic measures, unspecified: Secondary | ICD-10-CM | POA: Diagnosis not present

## 2020-01-14 DIAGNOSIS — I70219 Atherosclerosis of native arteries of extremities with intermittent claudication, unspecified extremity: Secondary | ICD-10-CM | POA: Diagnosis not present

## 2020-01-14 DIAGNOSIS — I70211 Atherosclerosis of native arteries of extremities with intermittent claudication, right leg: Secondary | ICD-10-CM | POA: Diagnosis not present

## 2020-01-15 DIAGNOSIS — I10 Essential (primary) hypertension: Secondary | ICD-10-CM | POA: Diagnosis not present

## 2020-01-15 DIAGNOSIS — E1165 Type 2 diabetes mellitus with hyperglycemia: Secondary | ICD-10-CM | POA: Diagnosis not present

## 2020-01-24 DIAGNOSIS — D485 Neoplasm of uncertain behavior of skin: Secondary | ICD-10-CM | POA: Diagnosis not present

## 2020-01-24 DIAGNOSIS — Z299 Encounter for prophylactic measures, unspecified: Secondary | ICD-10-CM | POA: Diagnosis not present

## 2020-01-24 DIAGNOSIS — C44311 Basal cell carcinoma of skin of nose: Secondary | ICD-10-CM | POA: Diagnosis not present

## 2020-01-24 DIAGNOSIS — I10 Essential (primary) hypertension: Secondary | ICD-10-CM | POA: Diagnosis not present

## 2020-02-14 DIAGNOSIS — E1165 Type 2 diabetes mellitus with hyperglycemia: Secondary | ICD-10-CM | POA: Diagnosis not present

## 2020-02-14 DIAGNOSIS — I10 Essential (primary) hypertension: Secondary | ICD-10-CM | POA: Diagnosis not present

## 2020-02-20 DIAGNOSIS — C44311 Basal cell carcinoma of skin of nose: Secondary | ICD-10-CM | POA: Diagnosis not present

## 2020-03-10 DIAGNOSIS — Z23 Encounter for immunization: Secondary | ICD-10-CM | POA: Diagnosis not present

## 2020-03-15 DIAGNOSIS — E1165 Type 2 diabetes mellitus with hyperglycemia: Secondary | ICD-10-CM | POA: Diagnosis not present

## 2020-03-15 DIAGNOSIS — I10 Essential (primary) hypertension: Secondary | ICD-10-CM | POA: Diagnosis not present

## 2020-03-25 DIAGNOSIS — C44311 Basal cell carcinoma of skin of nose: Secondary | ICD-10-CM | POA: Diagnosis not present

## 2020-04-15 DIAGNOSIS — E1165 Type 2 diabetes mellitus with hyperglycemia: Secondary | ICD-10-CM | POA: Diagnosis not present

## 2020-04-15 DIAGNOSIS — I10 Essential (primary) hypertension: Secondary | ICD-10-CM | POA: Diagnosis not present

## 2020-04-18 DIAGNOSIS — Z794 Long term (current) use of insulin: Secondary | ICD-10-CM | POA: Diagnosis not present

## 2020-04-18 DIAGNOSIS — E1165 Type 2 diabetes mellitus with hyperglycemia: Secondary | ICD-10-CM | POA: Diagnosis not present

## 2020-04-18 DIAGNOSIS — I739 Peripheral vascular disease, unspecified: Secondary | ICD-10-CM | POA: Diagnosis not present

## 2020-04-18 DIAGNOSIS — Z299 Encounter for prophylactic measures, unspecified: Secondary | ICD-10-CM | POA: Diagnosis not present

## 2020-04-18 DIAGNOSIS — I10 Essential (primary) hypertension: Secondary | ICD-10-CM | POA: Diagnosis not present

## 2020-04-23 DIAGNOSIS — D485 Neoplasm of uncertain behavior of skin: Secondary | ICD-10-CM | POA: Diagnosis not present

## 2020-04-23 DIAGNOSIS — L905 Scar conditions and fibrosis of skin: Secondary | ICD-10-CM | POA: Diagnosis not present

## 2020-04-23 DIAGNOSIS — L812 Freckles: Secondary | ICD-10-CM | POA: Diagnosis not present

## 2020-04-23 DIAGNOSIS — Z85820 Personal history of malignant melanoma of skin: Secondary | ICD-10-CM | POA: Diagnosis not present

## 2020-04-23 DIAGNOSIS — L570 Actinic keratosis: Secondary | ICD-10-CM | POA: Diagnosis not present

## 2020-04-23 DIAGNOSIS — L821 Other seborrheic keratosis: Secondary | ICD-10-CM | POA: Diagnosis not present

## 2020-04-23 DIAGNOSIS — D1801 Hemangioma of skin and subcutaneous tissue: Secondary | ICD-10-CM | POA: Diagnosis not present

## 2020-04-23 DIAGNOSIS — Z85828 Personal history of other malignant neoplasm of skin: Secondary | ICD-10-CM | POA: Diagnosis not present

## 2020-04-23 DIAGNOSIS — I788 Other diseases of capillaries: Secondary | ICD-10-CM | POA: Diagnosis not present

## 2020-05-15 DIAGNOSIS — E1165 Type 2 diabetes mellitus with hyperglycemia: Secondary | ICD-10-CM | POA: Diagnosis not present

## 2020-05-15 DIAGNOSIS — I10 Essential (primary) hypertension: Secondary | ICD-10-CM | POA: Diagnosis not present

## 2020-06-16 DIAGNOSIS — I10 Essential (primary) hypertension: Secondary | ICD-10-CM | POA: Diagnosis not present

## 2020-06-16 DIAGNOSIS — E1165 Type 2 diabetes mellitus with hyperglycemia: Secondary | ICD-10-CM | POA: Diagnosis not present

## 2020-07-14 DIAGNOSIS — I10 Essential (primary) hypertension: Secondary | ICD-10-CM | POA: Diagnosis not present

## 2020-07-14 DIAGNOSIS — E1165 Type 2 diabetes mellitus with hyperglycemia: Secondary | ICD-10-CM | POA: Diagnosis not present

## 2020-08-01 DIAGNOSIS — R809 Proteinuria, unspecified: Secondary | ICD-10-CM | POA: Diagnosis not present

## 2020-08-01 DIAGNOSIS — E1129 Type 2 diabetes mellitus with other diabetic kidney complication: Secondary | ICD-10-CM | POA: Diagnosis not present

## 2020-08-01 DIAGNOSIS — E1165 Type 2 diabetes mellitus with hyperglycemia: Secondary | ICD-10-CM | POA: Diagnosis not present

## 2020-08-01 DIAGNOSIS — I10 Essential (primary) hypertension: Secondary | ICD-10-CM | POA: Diagnosis not present

## 2020-08-01 DIAGNOSIS — Z299 Encounter for prophylactic measures, unspecified: Secondary | ICD-10-CM | POA: Diagnosis not present

## 2020-08-14 DIAGNOSIS — E1165 Type 2 diabetes mellitus with hyperglycemia: Secondary | ICD-10-CM | POA: Diagnosis not present

## 2020-08-14 DIAGNOSIS — I10 Essential (primary) hypertension: Secondary | ICD-10-CM | POA: Diagnosis not present

## 2020-09-12 DIAGNOSIS — I10 Essential (primary) hypertension: Secondary | ICD-10-CM | POA: Diagnosis not present

## 2020-09-12 DIAGNOSIS — E1165 Type 2 diabetes mellitus with hyperglycemia: Secondary | ICD-10-CM | POA: Diagnosis not present

## 2020-09-25 DIAGNOSIS — M545 Low back pain, unspecified: Secondary | ICD-10-CM | POA: Diagnosis not present

## 2020-09-25 DIAGNOSIS — M25561 Pain in right knee: Secondary | ICD-10-CM | POA: Diagnosis not present

## 2020-09-25 DIAGNOSIS — Z299 Encounter for prophylactic measures, unspecified: Secondary | ICD-10-CM | POA: Diagnosis not present

## 2020-09-25 DIAGNOSIS — I739 Peripheral vascular disease, unspecified: Secondary | ICD-10-CM | POA: Diagnosis not present

## 2020-09-25 DIAGNOSIS — M79606 Pain in leg, unspecified: Secondary | ICD-10-CM | POA: Diagnosis not present

## 2020-09-25 DIAGNOSIS — E1165 Type 2 diabetes mellitus with hyperglycemia: Secondary | ICD-10-CM | POA: Diagnosis not present

## 2020-09-25 DIAGNOSIS — I10 Essential (primary) hypertension: Secondary | ICD-10-CM | POA: Diagnosis not present

## 2020-10-14 DIAGNOSIS — E1165 Type 2 diabetes mellitus with hyperglycemia: Secondary | ICD-10-CM | POA: Diagnosis not present

## 2020-10-14 DIAGNOSIS — I10 Essential (primary) hypertension: Secondary | ICD-10-CM | POA: Diagnosis not present

## 2020-11-13 DIAGNOSIS — M1710 Unilateral primary osteoarthritis, unspecified knee: Secondary | ICD-10-CM | POA: Diagnosis not present

## 2020-11-13 DIAGNOSIS — E1165 Type 2 diabetes mellitus with hyperglycemia: Secondary | ICD-10-CM | POA: Diagnosis not present

## 2020-11-13 DIAGNOSIS — Z794 Long term (current) use of insulin: Secondary | ICD-10-CM | POA: Diagnosis not present

## 2020-11-13 DIAGNOSIS — I10 Essential (primary) hypertension: Secondary | ICD-10-CM | POA: Diagnosis not present

## 2020-11-13 DIAGNOSIS — Z299 Encounter for prophylactic measures, unspecified: Secondary | ICD-10-CM | POA: Diagnosis not present

## 2020-12-08 DIAGNOSIS — Z0000 Encounter for general adult medical examination without abnormal findings: Secondary | ICD-10-CM | POA: Diagnosis not present

## 2020-12-08 DIAGNOSIS — R5383 Other fatigue: Secondary | ICD-10-CM | POA: Diagnosis not present

## 2020-12-08 DIAGNOSIS — I10 Essential (primary) hypertension: Secondary | ICD-10-CM | POA: Diagnosis not present

## 2020-12-08 DIAGNOSIS — Z79899 Other long term (current) drug therapy: Secondary | ICD-10-CM | POA: Diagnosis not present

## 2020-12-08 DIAGNOSIS — Z125 Encounter for screening for malignant neoplasm of prostate: Secondary | ICD-10-CM | POA: Diagnosis not present

## 2020-12-08 DIAGNOSIS — Z6826 Body mass index (BMI) 26.0-26.9, adult: Secondary | ICD-10-CM | POA: Diagnosis not present

## 2020-12-08 DIAGNOSIS — E7800 Pure hypercholesterolemia, unspecified: Secondary | ICD-10-CM | POA: Diagnosis not present

## 2020-12-08 DIAGNOSIS — Z1331 Encounter for screening for depression: Secondary | ICD-10-CM | POA: Diagnosis not present

## 2020-12-08 DIAGNOSIS — Z7189 Other specified counseling: Secondary | ICD-10-CM | POA: Diagnosis not present

## 2020-12-08 DIAGNOSIS — Z299 Encounter for prophylactic measures, unspecified: Secondary | ICD-10-CM | POA: Diagnosis not present

## 2020-12-08 DIAGNOSIS — Z1339 Encounter for screening examination for other mental health and behavioral disorders: Secondary | ICD-10-CM | POA: Diagnosis not present

## 2020-12-12 DIAGNOSIS — E1165 Type 2 diabetes mellitus with hyperglycemia: Secondary | ICD-10-CM | POA: Diagnosis not present

## 2020-12-12 DIAGNOSIS — I10 Essential (primary) hypertension: Secondary | ICD-10-CM | POA: Diagnosis not present

## 2021-01-12 DIAGNOSIS — I10 Essential (primary) hypertension: Secondary | ICD-10-CM | POA: Diagnosis not present

## 2021-01-12 DIAGNOSIS — Z299 Encounter for prophylactic measures, unspecified: Secondary | ICD-10-CM | POA: Diagnosis not present

## 2021-01-12 DIAGNOSIS — R0602 Shortness of breath: Secondary | ICD-10-CM | POA: Diagnosis not present

## 2021-01-12 DIAGNOSIS — E1129 Type 2 diabetes mellitus with other diabetic kidney complication: Secondary | ICD-10-CM | POA: Diagnosis not present

## 2021-01-12 DIAGNOSIS — R600 Localized edema: Secondary | ICD-10-CM | POA: Diagnosis not present

## 2021-01-14 DIAGNOSIS — E1165 Type 2 diabetes mellitus with hyperglycemia: Secondary | ICD-10-CM | POA: Diagnosis not present

## 2021-01-14 DIAGNOSIS — I10 Essential (primary) hypertension: Secondary | ICD-10-CM | POA: Diagnosis not present

## 2021-01-22 DIAGNOSIS — E119 Type 2 diabetes mellitus without complications: Secondary | ICD-10-CM | POA: Diagnosis not present

## 2021-01-26 ENCOUNTER — Encounter: Payer: Self-pay | Admitting: Cardiology

## 2021-01-26 ENCOUNTER — Other Ambulatory Visit: Payer: Self-pay

## 2021-01-26 ENCOUNTER — Ambulatory Visit: Payer: Medicare PPO | Admitting: Cardiology

## 2021-01-26 VITALS — BP 141/86 | HR 115 | Temp 98.0°F | Resp 16 | Ht 78.0 in | Wt 226.0 lb

## 2021-01-26 DIAGNOSIS — R809 Proteinuria, unspecified: Secondary | ICD-10-CM | POA: Diagnosis not present

## 2021-01-26 DIAGNOSIS — G4730 Sleep apnea, unspecified: Secondary | ICD-10-CM

## 2021-01-26 DIAGNOSIS — I4891 Unspecified atrial fibrillation: Secondary | ICD-10-CM | POA: Diagnosis not present

## 2021-01-26 DIAGNOSIS — I10 Essential (primary) hypertension: Secondary | ICD-10-CM

## 2021-01-26 DIAGNOSIS — I2510 Atherosclerotic heart disease of native coronary artery without angina pectoris: Secondary | ICD-10-CM

## 2021-01-26 DIAGNOSIS — E1129 Type 2 diabetes mellitus with other diabetic kidney complication: Secondary | ICD-10-CM

## 2021-01-26 DIAGNOSIS — E782 Mixed hyperlipidemia: Secondary | ICD-10-CM | POA: Diagnosis not present

## 2021-01-26 DIAGNOSIS — R0602 Shortness of breath: Secondary | ICD-10-CM | POA: Diagnosis not present

## 2021-01-26 DIAGNOSIS — Z87891 Personal history of nicotine dependence: Secondary | ICD-10-CM

## 2021-01-26 MED ORDER — FUROSEMIDE 20 MG PO TABS: 20.0000 mg | ORAL_TABLET | Freq: Every morning | ORAL | 0 refills | Status: DC

## 2021-01-26 MED ORDER — APIXABAN 5 MG PO TABS: 5.0000 mg | ORAL_TABLET | Freq: Two times a day (BID) | ORAL | 0 refills | Status: DC

## 2021-01-26 MED ORDER — METOPROLOL TARTRATE 25 MG PO TABS: 25.0000 mg | ORAL_TABLET | Freq: Two times a day (BID) | ORAL | 2 refills | Status: DC

## 2021-01-26 NOTE — Progress Notes (Signed)
Date:  01/26/2021   ID:  Derek Bond, DOB [birth year], MRN [id number]  PCP:  Glenda Chroman, MD  Cardiologist:  Rex Kras, DO, [facility] (established care 01/26/2021)  REASON FOR CONSULT: Shortness of breath  REQUESTING PHYSICIAN:  Glenda Chroman, MD [place],  [place] [address]  Chief Complaint  Patient presents with   Dyspnea on exertion   New Patient (Initial Visit)    HPI  Derek Bond is a 75 y.o. male who presents to the office with a chief complaint of " dyspnea on exertion." Patient's past medical history and cardiovascular risk factors include: Hypertension, diabetes mellitus with microalbuminuria, erectile dysfunction, heartburn, former smoker, hyperlipidemia, sleep apnea not on CPAP, atrial fibrillation, advanced age.  He is referred to the office at the request of [name], MD for evaluation of shortness of breath.  Patient is accompanied by his daughter [name] at today's office visit.  He provides verbal consent with regards to discussing his medical information in her presence.  Approximately 1 month ago patient has been experiencing chest discomfort which she described as a heaviness like sensation, usually brought on by effort related activities, and last throughout the whole day, the discomfort is 4 out of 10.  Subsequently started experiencing shortness of breath as well with lower extremity swelling and paroxysmal nocturnal dyspnea.  Patient denies orthopnea but uses 3 pillows chronically.  Patient has history of heartburn and was attributing his symptoms to it.  No prior history of atrial fibrillation.  He denies any prior history of intracranial bleeding or gastrointestinal bleeding.  FUNCTIONAL STATUS: No structured exercise program or daily routine.   ALLERGIES: No Known Allergies  MEDICATION LIST PRIOR TO VISIT: Current Meds  Medication Sig   apixaban (ELIQUIS) 5 MG TABS tablet Take 1 tablet (5 mg total) by mouth 2 (two) times daily.   aspirin  EC 81 MG tablet Take 81 mg by mouth daily.   candesartan (ATACAND) 16 MG tablet Take 16 mg by mouth daily.    furosemide (LASIX) 20 MG tablet Take 1 tablet (20 mg total) by mouth every morning.   glipiZIDE (GLUCOTROL XL) 10 MG 24 hr tablet Take 10 mg by mouth 2 (two) times daily.   Insulin Degludec (TRESIBA FLEXTOUCH [place]) Inject 28 Units into the skin daily.    metFORMIN (GLUCOPHAGE) 1000 MG tablet Take 1,000 mg by mouth 2 (two) times daily with a meal.   metoprolol tartrate (LOPRESSOR) 25 MG tablet Take 1 tablet (25 mg total) by mouth 2 (two) times daily.   pantoprazole (PROTONIX) 40 MG tablet Take 40 mg by mouth daily.   rosuvastatin (CRESTOR) 5 MG tablet Take 5 mg by mouth every Wednesday. Patient reports that he takes daily on Wednesday    [DISCONTINUED] amLODipine (NORVASC) 10 MG tablet Take 10 mg by mouth daily.    [DISCONTINUED] diclofenac (VOLTAREN) 75 MG EC tablet Take 75 mg by mouth 2 (two) times daily.     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    Due to laceration at age 68    Hiatal hernia    History of melanoma    Obstructive sleep apnea    Type 2 diabetes mellitus ([place])     PAST SURGICAL HISTORY: Past Surgical History:  Procedure Laterality Date   BIOPSY  12/21/2019   Procedure: BIOPSY;  Surgeon: Harvel Quale, MD;  [facility];  Service: Gastroenterology;;  random colon   COLONOSCOPY WITH PROPOFOL N/A 12/21/2019   Procedure: COLONOSCOPY WITH PROPOFOL;  Surgeon: Harvel Quale, MD;  Location: [facility];  Service: Gastroenterology;  Laterality: N/A;  915   MELANOMA EXCISION     Left facial region   REPLACEMENT TOTAL KNEE Left    UMBILICAL HERNIA REPAIR      FAMILY HISTORY: The patient family history includes Diabetes in his  brother, mother, and another family member; Hypertension in his brother, brother, sister, sister, and another family member; Lung cancer in his father; Rheum arthritis in an other family member; Stroke in an other family member.  SOCIAL HISTORY:  The patient  reports that he quit smoking about 31 years ago. His smoking use included cigarettes. He has a 60.00 pack-year smoking history. He has never used smokeless tobacco. He reports that he does not currently use alcohol. He reports that he does not use drugs.  REVIEW OF SYSTEMS: Review of Systems  Constitutional: Positive for malaise/fatigue. Negative for chills and fever.  HENT:  Negative for hoarse voice and nosebleeds.   Eyes:  Negative for discharge, double vision and pain.  Cardiovascular:  Positive for chest pain and dyspnea on exertion. Negative for claudication, leg swelling, near-syncope, orthopnea, palpitations, paroxysmal nocturnal dyspnea and syncope.  Respiratory:  Negative for hemoptysis and shortness of breath.   Musculoskeletal:  Negative for muscle cramps and myalgias.  Gastrointestinal:  Positive for heartburn. Negative for abdominal pain, constipation, diarrhea, hematemesis, hematochezia, melena, nausea and vomiting.  Neurological:  Negative for dizziness and light-headedness.   PHYSICAL EXAM: Vitals with BMI 01/26/2021 12/21/2019 12/21/2019  Height '6\' 6"'$  - -  Weight 226 lbs - -  BMI [id number] - -  Systolic [id number]  Diastolic 86 79 86  Pulse [id number] 61    CONSTITUTIONAL: Well-developed and well-nourished. No acute distress.  SKIN: Skin is warm and dry. No rash noted. No cyanosis. No pallor. No jaundice HEAD: Normocephalic and atraumatic.  EYES: No scleral icterus MOUTH/THROAT: Moist oral membranes.  NECK: No JVD present. No thyromegaly noted. No carotid bruits  LYMPHATIC: No visible cervical adenopathy.  CHEST Normal respiratory effort. No intercostal retractions  LUNGS: Clear to auscultation bilaterally.  No stridor.  No wheezes. No rales.  CARDIOVASCULAR: Irregularly irregular, variable S1-S2, no murmurs rubs or gallops appreciated secondary to tachycardia. ABDOMINAL: Soft, nontender, nondistended, positive bowel sounds in all 4 quadrants, no apparent ascites.  EXTREMITIES: +1 bilateral pitting peripheral edema, warm to touch bilaterally. HEMATOLOGIC: No significant bruising NEUROLOGIC: Oriented to person, place, and time. Nonfocal. Normal muscle tone.  PSYCHIATRIC: Normal mood and affect. Normal behavior. Cooperative  CARDIAC DATABASE: EKG: 01/26/2021: Atrial fibrillation with rapid ventricular rate 126 bpm, left axis deviation, left anterior fascicular block, left bundle branch block, consider old inferior infarct.  Echocardiogram: No results found for this or any previous visit from the past 1095 days.   Stress Testing: MPI 02/12/2016: No diagnostic ST segment changes to indicate ischemia. No significant arrhythmias. Small, mild intensity, partially reversible mid to basal inferior defect suggestive of variable soft tissue attenuation. No large ischemic zones are noted. This is a low risk study. Nuclear stress EF: 55%.  Heart Catheterization: 2009: Mild CAD (see progress note 02/10/2016)  LABORATORY DATA: CBC 08/02/2007 08/01/2007  WBC 5.9 6.2  Hemoglobin 13.3 13.5  Hematocrit 38.7(L) 39.7  Platelets  220 228    CMP Latest Ref Rng & Units 12/19/2019 08/02/2007 08/01/2007  Glucose 70 - 99 mg/dL 182(H) 98 89  BUN 8 - 23 mg/dL '14 9 9  '$ Creatinine 0.61 - 1.24 mg/dL 0.83 0.71 0.68  Sodium 135 - 145 mmol/L 137 139 138  Potassium 3.5 - 5.1 mmol/L 3.9 3.6 3.6  Chloride 98 - 111 mmol/L 103 106 104  CO2 22 - 32 mmol/L '23 26 26  '$ Calcium 8.9 - 10.3 mg/dL 9.5 9.6 9.6    Lipid Panel     Component Value Date/Time   CHOL  08/01/2007 0435    173        ATP III CLASSIFICATION:  <200     mg/dL   Desirable  200-239  mg/dL   Borderline High  >=240    mg/dL   High   TRIG 129 08/01/2007 0435   HDL 30 (L)  08/01/2007 0435   CHOLHDL 5.8 08/01/2007 0435   VLDL 26 08/01/2007 0435   LDLCALC (H) 08/01/2007 0435    117        Total Cholesterol/HDL:CHD Risk Coronary Heart Disease Risk Table                     Men   Women  1/2 Average Risk   3.4   3.3    No components found for: NTPROBNP No results for input(s): PROBNP in the last 8760 hours. No results for input(s): TSH in the last 8760 hours.  BMP No results for input(s): NA, K, CL, CO2, GLUCOSE, BUN, CREATININE, CALCIUM, GFRNONAA, GFRAA in the last 8760 hours.  HEMOGLOBIN A1C No results found for: HGBA1C, MPG  IMPRESSION:    ICD-10-CM   1. Atrial fibrillation, unspecified type (HCC)  I48.91 Hemoglobin and hematocrit, blood    Magnesium    Basic metabolic panel    metoprolol tartrate (LOPRESSOR) 25 MG tablet    apixaban (ELIQUIS) 5 MG TABS tablet    2. Shortness of breath  R06.02 EKG 12-Lead    PCV ECHOCARDIOGRAM COMPLETE    furosemide (LASIX) 20 MG tablet    B Nat Peptide    3. Sleep apnea, unspecified type  G47.30     4. Nonobstructive atherosclerosis of coronary artery  I25.10     5. Benign hypertension  I10     6. Type 2 diabetes mellitus with microalbuminuria, with long-term current use of insulin (HCC)  E11.29    R80.9    Z79.4     7. Mixed hyperlipidemia  E78.2     8. Former smoker  Z87.891        RECOMMENDATIONS: Daishawn Salama Tarango is a 75 y.o. male whose past medical history and cardiac risk factors include: Hypertension, diabetes mellitus with microalbuminuria, erectile dysfunction, heartburn, former smoker, hyperlipidemia, sleep apnea not on CPAP, atrial fibrillation, advanced age.  Atrial fibrillation, unspecified type ([place]) Newly diagnosed. Chronicity unknown. Rate control: Lopressor 25 mg p.o. twice daily, prescription sent. Rhythm control: N/A Thromboembolic prophylaxis: Eliquis, samples provided Check hemoglobin hematocrit, BMP and magnesium level. As long as the hemoglobin and hematocrit are within  acceptable range we will send in a prescription for Eliquis. Had a long discussion with the patient and his daughter with regards to the risks, benefits, and alternatives to oral anticoagulation.  They verbalized understanding and accept the risks. Echocardiogram will be ordered to evaluate for structural heart disease and left ventricular systolic function.  Shortness of breath Multifactorial: Most likely secondary to A. fib with  RVR leading to symptoms of congestive heart failure. Check BNP Echocardiogram will be ordered to evaluate for structural heart disease and left ventricular systolic function. Start Lasix 20 mg p.o. daily Differential diagnosis includes: Congestive heart failure, coronary artery disease, pulmonary etiology.  Nonobstructive atherosclerosis of coronary artery Noted to have nonobstructive CAD based on the cath in 2009. We will start him on beta-blocker therapy. Continue ARB, statin therapy. Ischemic evaluation forthcoming once his ventricular rate is better controlled.  Benign hypertension Within acceptable range but not well controlled. Medications reconciled. Low-salt diet recommended. Anticipate that his blood pressures will improve upon further titration of his medical therapy. Currently managed by primary care provider.  Type 2 diabetes mellitus with microalbuminuria, with long-term current use of insulin ([place]) Educated on the importance of glycemic control. Patient is already on ARB, statin therapy.  Mixed hyperlipidemia Continue statin therapy. Will request labs from PCPs office. Currently managed by primary care provider.  Former smoker Educated on the importance of continued smoking cessation.  Given his cardiovascular risk factors I cannot rule out CAD at this time.  This was made very clear to the patient and his daughter during today's encounter.  If he has worsening shortness of breath or new onset of chest pain he needs to go to the closest ER  via EMS for further evaluation and management.  Both the patient and his daughter verbalized understanding.  I would like to see him in close follow-up in 2 weeks to further evaluate his volume status as well as his ventricular rate.  FINAL MEDICATION LIST END OF ENCOUNTER: Meds ordered this encounter  Medications   metoprolol tartrate (LOPRESSOR) 25 MG tablet    Sig: Take 1 tablet (25 mg total) by mouth 2 (two) times daily.    Dispense:  60 tablet    Refill:  2   apixaban (ELIQUIS) 5 MG TABS tablet    Sig: Take 1 tablet (5 mg total) by mouth 2 (two) times daily.    Dispense:  180 tablet    Refill:  0   furosemide (LASIX) 20 MG tablet    Sig: Take 1 tablet (20 mg total) by mouth every morning.    Dispense:  30 tablet    Refill:  0    Medications Discontinued During This Encounter  Medication Reason   amLODipine (NORVASC) 10 MG tablet    diclofenac (VOLTAREN) 75 MG EC tablet      Current Outpatient Medications:    apixaban (ELIQUIS) 5 MG TABS tablet, Take 1 tablet (5 mg total) by mouth 2 (two) times daily., Disp: 180 tablet, Rfl: 0   aspirin EC 81 MG tablet, Take 81 mg by mouth daily., Disp: , Rfl:    candesartan (ATACAND) 16 MG tablet, Take 16 mg by mouth daily. , Disp: , Rfl:    furosemide (LASIX) 20 MG tablet, Take 1 tablet (20 mg total) by mouth every morning., Disp: 30 tablet, Rfl: 0   glipiZIDE (GLUCOTROL XL) 10 MG 24 hr tablet, Take 10 mg by mouth 2 (two) times daily., Disp: , Rfl:    Insulin Degludec (TRESIBA FLEXTOUCH [place]), Inject 28 Units into the skin daily. , Disp: , Rfl:    metFORMIN (GLUCOPHAGE) 1000 MG tablet, Take 1,000 mg by mouth 2 (two) times daily with a meal., Disp: , Rfl:    metoprolol tartrate (LOPRESSOR) 25 MG tablet, Take 1 tablet (25 mg total) by mouth 2 (two) times daily., Disp: 60 tablet, Rfl: 2   pantoprazole (PROTONIX) 40 MG  tablet, Take 40 mg by mouth daily., Disp: , Rfl:    rosuvastatin (CRESTOR) 5 MG tablet, Take 5 mg by mouth every Wednesday. Patient  reports that he takes daily on Wednesday , Disp: , Rfl:   Orders Placed This Encounter  Procedures   Hemoglobin and hematocrit, blood   Magnesium   Basic metabolic panel   B Nat Peptide   EKG 12-Lead   PCV ECHOCARDIOGRAM COMPLETE    There are no Patient Instructions on file for this visit.   --Continue cardiac medications as reconciled in final medication list. --Return in about 2 weeks (around 02/09/2021) for Follow up, Dyspnea, A. fib. Or sooner if needed. --Continue follow-up with your primary care physician regarding the management of your other chronic comorbid conditions.  Patient's questions and concerns were addressed to his satisfaction. He voices understanding of the instructions provided during this encounter.   This note was created using a voice recognition software as a result there may be grammatical errors inadvertently enclosed that do not reflect the nature of this encounter. Every attempt is made to correct such errors.  Rex Kras, [place], [facility]  Pager: [phone] Office: [phone]

## 2021-01-27 LAB — BASIC METABOLIC PANEL
BUN/Creatinine Ratio: 19 (ref 10–24)
BUN: 15 mg/dL (ref 8–27)
CO2: 22 mmol/L (ref 20–29)
Calcium: 9.9 mg/dL (ref 8.6–10.2)
Chloride: 103 mmol/L (ref 96–106)
Creatinine, Ser: 0.79 mg/dL (ref 0.76–1.27)
Glucose: 91 mg/dL (ref 65–99)
Potassium: 4.5 mmol/L (ref 3.5–5.2)
Sodium: 142 mmol/L (ref 134–144)
eGFR: 93 mL/min/{1.73_m2} (ref >59)

## 2021-01-27 LAB — HEMOGLOBIN AND HEMATOCRIT, BLOOD
Hematocrit: 42.5 % (ref 37.5–51.0)
Hemoglobin: 14.5 g/dL (ref 13.0–17.7)

## 2021-01-27 LAB — BRAIN NATRIURETIC PEPTIDE: BNP: 542 pg/mL — ABNORMAL HIGH (ref 0.0–100.0)

## 2021-01-27 LAB — MAGNESIUM: Magnesium: 1.3 mg/dL — ABNORMAL LOW (ref 1.6–2.3)

## 2021-01-30 ENCOUNTER — Other Ambulatory Visit: Payer: Self-pay | Admitting: Cardiology

## 2021-01-30 DIAGNOSIS — I509 Heart failure, unspecified: Secondary | ICD-10-CM

## 2021-01-30 DIAGNOSIS — R0602 Shortness of breath: Secondary | ICD-10-CM

## 2021-01-30 DIAGNOSIS — I4891 Unspecified atrial fibrillation: Secondary | ICD-10-CM

## 2021-01-30 MED ORDER — APIXABAN 5 MG PO TABS: 5.0000 mg | ORAL_TABLET | Freq: Two times a day (BID) | ORAL | 0 refills | Status: DC

## 2021-01-30 MED ORDER — MAGNESIUM OXIDE 400 MG PO CAPS: 400.0000 mg | ORAL_CAPSULE | Freq: Two times a day (BID) | ORAL | 0 refills | Status: AC

## 2021-01-30 MED ORDER — ENTRESTO 49-51 MG PO TABS: 1.0000 | ORAL_TABLET | Freq: Two times a day (BID) | ORAL | 0 refills | Status: DC

## 2021-01-30 NOTE — Progress Notes (Signed)
Labs from 01/26/2021 reviewed.  Call the patient's daughter and had to leave voicemail.  Hemoglobin is relatively stable, send in prescription for Eliquis to their local pharmacy.  Given his hypomagnesemia prescribed him magnesium oxide 400 mg p.o. twice daily.  I would like to speak to either the patient or his daughter and consider transitioning him from ARB to [facility].  Scheduled labs on 02/09/2021 prior to the next office visit  We will get a hold of either the patient or his daughter in the interim.  Rex Kras, [place], [facility]  Pager: [phone] Office: [phone]

## 2021-02-02 ENCOUNTER — Other Ambulatory Visit: Payer: Self-pay

## 2021-02-02 ENCOUNTER — Telehealth: Payer: Self-pay | Admitting: Cardiology

## 2021-02-02 DIAGNOSIS — I4891 Unspecified atrial fibrillation: Secondary | ICD-10-CM

## 2021-02-02 MED ORDER — APIXABAN 5 MG PO TABS: 5.0000 mg | ORAL_TABLET | Freq: Two times a day (BID) | ORAL | 0 refills | Status: DC

## 2021-02-02 NOTE — Telephone Encounter (Signed)
Done

## 2021-02-02 NOTE — Telephone Encounter (Signed)
Pt's daughter says she requested refill for 3 medications last week, only the entresto & magnesium went through. Received samples for the eliquis, but running out & need refill. Says it did not go through last Friday 9/16 like the others did.

## 2021-02-03 ENCOUNTER — Other Ambulatory Visit: Payer: Self-pay

## 2021-02-03 DIAGNOSIS — R0602 Shortness of breath: Secondary | ICD-10-CM

## 2021-02-03 MED ORDER — FUROSEMIDE 20 MG PO TABS: 20.0000 mg | ORAL_TABLET | Freq: Every morning | ORAL | 0 refills | Status: DC

## 2021-02-05 ENCOUNTER — Ambulatory Visit: Payer: Medicare PPO

## 2021-02-05 ENCOUNTER — Other Ambulatory Visit: Payer: Self-pay

## 2021-02-05 DIAGNOSIS — R0602 Shortness of breath: Secondary | ICD-10-CM | POA: Diagnosis not present

## 2021-02-06 LAB — BASIC METABOLIC PANEL
BUN/Creatinine Ratio: 21 (ref 10–24)
BUN: 19 mg/dL (ref 8–27)
CO2: 19 mmol/L — ABNORMAL LOW (ref 20–29)
Calcium: 9.5 mg/dL (ref 8.6–10.2)
Chloride: 105 mmol/L (ref 96–106)
Creatinine, Ser: 0.89 mg/dL (ref 0.76–1.27)
Glucose: 200 mg/dL — ABNORMAL HIGH (ref 65–99)
Potassium: 4.1 mmol/L (ref 3.5–5.2)
Sodium: 140 mmol/L (ref 134–144)
eGFR: 89 mL/min/{1.73_m2} (ref >59)

## 2021-02-06 LAB — PRO B NATRIURETIC PEPTIDE: NT-Pro BNP: 1567 pg/mL — ABNORMAL HIGH (ref 0–486)

## 2021-02-06 LAB — MAGNESIUM: Magnesium: 1.4 mg/dL — ABNORMAL LOW (ref 1.6–2.3)

## 2021-02-09 NOTE — Progress Notes (Signed)
Tried calling patient --no answer

## 2021-02-11 ENCOUNTER — Ambulatory Visit: Payer: Medicare PPO | Admitting: Cardiology

## 2021-02-11 ENCOUNTER — Other Ambulatory Visit: Payer: Self-pay

## 2021-02-11 ENCOUNTER — Encounter: Payer: Self-pay | Admitting: Cardiology

## 2021-02-11 VITALS — BP 147/98 | HR 78 | Temp 97.9°F | Resp 16 | Ht 78.0 in | Wt 219.0 lb

## 2021-02-11 DIAGNOSIS — I5021 Acute systolic (congestive) heart failure: Secondary | ICD-10-CM | POA: Diagnosis not present

## 2021-02-11 DIAGNOSIS — R0602 Shortness of breath: Secondary | ICD-10-CM

## 2021-02-11 DIAGNOSIS — I4819 Other persistent atrial fibrillation: Secondary | ICD-10-CM

## 2021-02-11 DIAGNOSIS — I429 Cardiomyopathy, unspecified: Secondary | ICD-10-CM | POA: Diagnosis not present

## 2021-02-11 DIAGNOSIS — G4730 Sleep apnea, unspecified: Secondary | ICD-10-CM

## 2021-02-11 DIAGNOSIS — E1129 Type 2 diabetes mellitus with other diabetic kidney complication: Secondary | ICD-10-CM | POA: Diagnosis not present

## 2021-02-11 DIAGNOSIS — Z87891 Personal history of nicotine dependence: Secondary | ICD-10-CM

## 2021-02-11 DIAGNOSIS — I10 Essential (primary) hypertension: Secondary | ICD-10-CM

## 2021-02-11 DIAGNOSIS — E782 Mixed hyperlipidemia: Secondary | ICD-10-CM

## 2021-02-11 DIAGNOSIS — I2510 Atherosclerotic heart disease of native coronary artery without angina pectoris: Secondary | ICD-10-CM | POA: Diagnosis not present

## 2021-02-11 MED ORDER — DAPAGLIFLOZIN PROPANEDIOL 10 MG PO TABS: 10.0000 mg | ORAL_TABLET | Freq: Every day | ORAL | 0 refills | Status: AC

## 2021-02-11 NOTE — Progress Notes (Signed)
Date:  02/11/2021   ID:  Derek Bond, DOB 1945-08-01, MRN [id number]  PCP:  Glenda Chroman, MD  Cardiologist:  Rex Kras, DO, [place] (established care 01/26/2021)  Date: 02/11/21 Last Office Visit: 01/26/2021   Chief Complaint  Patient presents with   Shortness of Breath   Atrial Fibrillation   Follow-up    HPI  Derek Bond is a 75 y.o. male who presents to the office with a chief complaint of " shortness of breath and heart failure management." Patient's past medical history and cardiovascular risk factors include: Cardiomyopathy, acute/chronic heart failure with reduced EF, hypertension, diabetes mellitus with microalbuminuria, erectile dysfunction, heartburn, former smoker, hyperlipidemia, sleep apnea not on CPAP, atrial fibrillation, advanced age.  He is referred to the office at the request of [name], MD for evaluation of shortness of breath.  In August 2022 patient had an episode of severe chest discomfort which he described as a heavy like sensation, brought on by effort related activities, improves with resting but lasted entire day.  Since then he been experiencing shortness of breath and lower extremity swelling along with paroxysmal nocturnal dyspnea.  At baseline he has 3 pillow orthopnea.  He was referred to cardiology for further evaluation and management.  EKG on initial consultation indicated atrial fibrillation which is a new diagnosis for him.  He was started on AV nodal blocking agents as well as oral anticoagulation for thromboembolic prophylaxis.  An echocardiogram was ordered for further risk stratification prior to ordering an ischemic work-up.  Patient is noted to have severely reduced LVEF with predominantly global hypokinesis with regionality involving the septum which may be secondary to his underlying left bundle branch block underlying ischemia cannot be ruled out.  Patient was transitioned from [facility] to [facility] and is tolerating the medication  well without any side effects or intolerances.  Most recent labs from 02/05/2021 independently reviewed.  Patient has lost approximately 7 pounds since last office visit and his lower extremity swelling has completely resolved.  FUNCTIONAL STATUS: No structured exercise program or daily routine.   ALLERGIES: No Known Allergies  MEDICATION LIST PRIOR TO VISIT: Current Meds  Medication Sig   apixaban (ELIQUIS) 5 MG TABS tablet Take 1 tablet (5 mg total) by mouth 2 (two) times daily.   dapagliflozin propanediol (FARXIGA) 10 MG TABS tablet Take 1 tablet (10 mg total) by mouth daily before breakfast.   glipiZIDE (GLUCOTROL XL) 10 MG 24 hr tablet Take 10 mg by mouth 2 (two) times daily.   Insulin Degludec (TRESIBA FLEXTOUCH [place]) Inject 28 Units into the skin daily.    Magnesium Oxide 400 MG CAPS Take 1 capsule (400 mg total) by mouth in the morning and at bedtime.   metFORMIN (GLUCOPHAGE) 1000 MG tablet Take 1,000 mg by mouth 2 (two) times daily with a meal.   metoprolol tartrate (LOPRESSOR) 25 MG tablet Take 1 tablet (25 mg total) by mouth 2 (two) times daily.   nitroGLYCERIN (NITROSTAT) 0.4 MG SL tablet Place 0.4 mg under the tongue every 5 (five) minutes as needed for chest pain.   pantoprazole (PROTONIX) 40 MG tablet Take 40 mg by mouth daily.   rosuvastatin (CRESTOR) 5 MG tablet Take 5 mg by mouth at bedtime.   sacubitril-valsartan (ENTRESTO) 49-51 MG Take 1 tablet by mouth 2 (two) times daily.   [DISCONTINUED] furosemide (LASIX) 20 MG tablet Take 1 tablet (20 mg total) by mouth every morning.     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    Due to laceration at age 22    Hiatal hernia    History of melanoma    Obstructive sleep apnea    Type 2 diabetes mellitus ([place])     PAST  SURGICAL HISTORY: Past Surgical History:  Procedure Laterality Date   BIOPSY  12/21/2019   Procedure: BIOPSY;  Surgeon: Harvel Quale, MD;  Location: [facility];  Service: Gastroenterology;;  random colon   COLONOSCOPY WITH PROPOFOL N/A 12/21/2019   Procedure: COLONOSCOPY WITH PROPOFOL;  Surgeon: Harvel Quale, MD;  Location: [facility];  Service: Gastroenterology;  Laterality: N/A;  915   MELANOMA EXCISION     Left facial region   REPLACEMENT TOTAL KNEE Left    UMBILICAL HERNIA REPAIR      FAMILY HISTORY: The patient family history includes Diabetes in his brother, mother, and another family member; Hypertension in his brother, brother, sister, sister, and another family member; Lung cancer in his father; Rheum arthritis in an other family member; Stroke in an other family member.  SOCIAL HISTORY:  The patient  reports that he quit smoking about 31 years ago. His smoking use included cigarettes. He has a 60.00 pack-year smoking history. He has never used smokeless tobacco. He reports that he does not currently use alcohol. He reports that he does not use drugs.  REVIEW OF SYSTEMS: Review of Systems  Constitutional: Positive for malaise/fatigue. Negative for chills and fever.  HENT:  Negative for hoarse voice and nosebleeds.   Eyes:  Negative for discharge, double vision and pain.  Cardiovascular:  Positive for dyspnea on exertion. Negative for chest pain, claudication, leg swelling, near-syncope, orthopnea, palpitations, paroxysmal nocturnal dyspnea and syncope.  Respiratory:  Negative for hemoptysis and shortness of breath.   Musculoskeletal:  Negative for muscle cramps and myalgias.  Gastrointestinal:  Positive for heartburn. Negative for abdominal pain, constipation, diarrhea, hematemesis, hematochezia, melena, nausea and vomiting.  Neurological:  Negative for dizziness and light-headedness.   PHYSICAL EXAM: Vitals with BMI 02/11/2021 01/26/2021 12/21/2019   Height 6\' 6"  6\' 6"  -  Weight 219 lbs 226 lbs -  BMI 92.01 00.71 -  Systolic 219 758 832  Diastolic 549 86 79  Pulse 78 115 72    CONSTITUTIONAL: Well-developed and well-nourished. No acute distress.  SKIN: Skin is warm and dry. No rash noted. No cyanosis. No pallor. No jaundice HEAD: Normocephalic and atraumatic.  EYES: No scleral icterus MOUTH/THROAT: Moist oral membranes.  NECK: No JVD present. No thyromegaly noted. No carotid bruits  LYMPHATIC: No visible cervical adenopathy.  CHEST Normal respiratory effort. No intercostal retractions  LUNGS: Clear to auscultation bilaterally.  No stridor. No wheezes. No rales.  CARDIOVASCULAR: Irregularly irregular, variable S1-S2, no murmurs rubs or gallops appreciated secondary to tachycardia. ABDOMINAL: Soft, nontender, nondistended, positive bowel sounds in all 4 quadrants, no apparent ascites.  EXTREMITIES: No bilateral peripheral edema, warm to touch bilaterally. HEMATOLOGIC: No significant bruising NEUROLOGIC: Oriented to person, place, and time. Nonfocal. Normal muscle tone.  PSYCHIATRIC: Normal mood and affect. Normal behavior. Cooperative  CARDIAC DATABASE: EKG: 01/26/2021: Atrial fibrillation with rapid ventricular rate 126 bpm, left axis deviation, left anterior fascicular block, left bundle branch block, consider old inferior infarct.  02/11/2021: Atrial fibrillation, 105 bpm, left axis, left bundle  branch block.   Echocardiogram: 02/05/2021:  Severely depressed LV systolic function with visual EF 25-30%. Hypokinetic global wall motion with regionality involving septal wall. Left ventricle cavity is mildly dilated. Moderate left ventricular hypertrophy. Unable to evaluate diastolic function due to atrial fibrillation. Elevated LAP.  Moderate (Grade II) mitral regurgitation.  Moderate tricuspid regurgitation. Mild pulmonary hypertension, RVSP 29mmHg.  IVC is dilated with a respiratory response of <50%.  No prior study for  comparison.  Stress Testing: MPI 02/12/2016: No diagnostic ST segment changes to indicate ischemia. No significant arrhythmias. Small, mild intensity, partially reversible mid to basal inferior defect suggestive of variable soft tissue attenuation. No large ischemic zones are noted. This is a low risk study. Nuclear stress EF: 55%.  Heart Catheterization: 2009: Mild CAD (see progress note 02/10/2016)  LABORATORY DATA: CBC Latest Ref Rng & Units 01/26/2021 08/02/2007 08/01/2007  WBC - - 5.9 6.2  Hemoglobin 13.0 - 17.7 g/dL 14.5 13.3 13.5  Hematocrit 37.5 - 51.0 % 42.5 38.7(L) 39.7  Platelets - - 220 228    CMP Latest Ref Rng & Units 02/05/2021 01/26/2021 12/19/2019  Glucose 65 - 99 mg/dL 200(H) 91 182(H)  BUN 8 - 27 mg/dL 19 15 14   Creatinine 0.76 - 1.27 mg/dL 0.89 0.79 0.83  Sodium 134 - 144 mmol/L 140 142 137  Potassium 3.5 - 5.2 mmol/L 4.1 4.5 3.9  Chloride 96 - 106 mmol/L 105 103 103  CO2 20 - 29 mmol/L 19(L) 22 23  Calcium 8.6 - 10.2 mg/dL 9.5 9.9 9.5    Lipid Panel     Component Value Date/Time   CHOL  08/01/2007 0435    173        ATP III CLASSIFICATION:  <200     mg/dL   Desirable  200-239  mg/dL   Borderline High  >=240    mg/dL   High   TRIG 129 08/01/2007 0435   HDL 30 (L) 08/01/2007 0435   CHOLHDL 5.8 08/01/2007 0435   VLDL 26 08/01/2007 0435   LDLCALC (H) 08/01/2007 0435    117        Total Cholesterol/HDL:CHD Risk Coronary Heart Disease Risk Table                     Men   Women  1/2 Average Risk   3.4   3.3    No components found for: NTPROBNP Recent Labs    02/05/21 1441  PROBNP 1,567*   No results for input(s): TSH in the last 8760 hours.  BMP Recent Labs    01/26/21 1443 02/05/21 1441  NA 142 140  K 4.5 4.1  CL 103 105  CO2 22 19*  GLUCOSE 91 200*  BUN 15 19  CREATININE 0.79 0.89  CALCIUM 9.9 9.5    HEMOGLOBIN A1C No results found for: HGBA1C, MPG  IMPRESSION:    ICD-10-CM   1. Acute HFrEF (heart failure with reduced ejection  fraction) (HCC)  I50.21 dapagliflozin propanediol (FARXIGA) 10 MG TABS tablet    Basic metabolic panel    Magnesium    Pro b natriuretic peptide (BNP)    CBC    2. Cardiomyopathy, unspecified type ([place])  I42.9 dapagliflozin propanediol (FARXIGA) 10 MG TABS tablet    3. Nonobstructive atherosclerosis of coronary artery  I25.10     4. Persistent atrial fibrillation (HCC)  I48.19 EKG 12-Lead    5. Shortness of breath  R06.02     6. Sleep apnea, unspecified type  G47.30  7. Benign hypertension  I10     8. Type 2 diabetes mellitus with microalbuminuria, with long-term current use of insulin (HCC)  E11.29    R80.9    Z79.4     9. Former smoker  Z87.891     56. Mixed hyperlipidemia  E78.2        RECOMMENDATIONS: Claudia R Rill is a 75 y.o. male whose past medical history and cardiac risk factors include: Cardiomyopathy, acute/chronic heart failure with reduced EF, hypertension, diabetes mellitus with microalbuminuria, erectile dysfunction, heartburn, former smoker, hyperlipidemia, sleep apnea not on CPAP, atrial fibrillation, advanced age.  Acute HFrEF (heart failure with reduced ejection fraction) (HCC) Stage C, NYHA class III Echocardiogram notes severely reduced LVEF with global hypokinesis. Patient has diuresed approximately 7 pounds over the last 2 weeks with up titration of GDMT. Independently reviewed labs from 02/05/2021. Discontinue Lasix Start Farxiga 10 mg p.o. daily. Medications reconciled. Will uptitrate GDMT in a stepwise fashion.  Cardiomyopathy, unspecified type ([place]) Underlying etiology unknown. Recommended ischemic evaluation Discussed left and right heart catheterization to evaluate for obstructive CAD as well as hemodynamics.  Both the patient and daughter verbalized understanding and would like to proceed forward.  Alternatives included coronary CTA which is not ideal given his ventricular rate and underlying atrial fibrillation. The procedure of left and  right heart catheterization with possible intervention was explained to the patient in detail.  The indication, alternatives, risks and benefits were reviewed.  Complications include but not limited to bleeding, infection, vascular injury, stroke, myocardial infection, arrhythmia, need for temporary or permanent pacemaker, kidney injury requiring short-term or long-term dialysis, radiation-related injury in the case of prolonged fluoroscopy use, emergency cardiac surgery, and death. The patient understands the risks of serious complication is 1-2 in 9242 with diagnostic cardiac cath and 1-2% or less with angioplasty/stenting.  The patient and his daughter voices understanding and provides verbal feedback and wishes to proceed with left and right heart catheterization with possible intervention. Patient does not have any active chest pain.  Both the patient and his daughter understand that if he develops chest discomfort or worsening shortness of breath he needs to go to the closest ER via EMS for further evaluation and management. Further recommendations to follow.  LBBB: Monitor for now.   Persistent atrial fibrillation (HCC) EKG today notes atrial fibrillation with ventricular rate compared to 2 weeks ago. Rate control: Metoprolol. Rhythm control: N/A. Thromboembolic prophylaxis: Eliquis CHA2DS2-VASc SCORE is 5 which correlates to 7.2 % risk of stroke per year (age, CHF, Hypertension, diabetes).  Benign hypertension Within acceptable range but not well controlled. Medications reconciled. Low-salt diet recommended. Anticipate that his blood pressures will improve upon further titration of his medical therapy. Currently managed by primary care provider.  Type 2 diabetes mellitus with microalbuminuria, with long-term current use of insulin ([place]) Educated on the importance of glycemic control. Patient is already on ARNI, statin therapy.  Mixed hyperlipidemia Continue statin therapy. Will  request labs from PCPs office. Currently managed by primary care provider.  Former smoker Educated on the importance of continued smoking cessation.  Total time spent discussing the management and disease progression of newly diagnosed atrial fibrillation, newly diagnosed heart failure with reduced EF, and ischemic evaluation.  Questions and concerns were addressed to their satisfaction.  Obtained an informed consent with regards to the left and right heart catheterization.  Further up titration of guideline directed medical therapy and reviewing medication profile and side effects.  Follow-up blood work in 1 week to  evaluate kidney function and electrolytes.   FINAL MEDICATION LIST END OF ENCOUNTER: Meds ordered this encounter  Medications   dapagliflozin propanediol (FARXIGA) 10 MG TABS tablet    Sig: Take 1 tablet (10 mg total) by mouth daily before breakfast.    Dispense:  90 tablet    Refill:  0    Medications Discontinued During This Encounter  Medication Reason   furosemide (LASIX) 20 MG tablet Change in therapy     Current Outpatient Medications:    apixaban (ELIQUIS) 5 MG TABS tablet, Take 1 tablet (5 mg total) by mouth 2 (two) times daily., Disp: 180 tablet, Rfl: 0   dapagliflozin propanediol (FARXIGA) 10 MG TABS tablet, Take 1 tablet (10 mg total) by mouth daily before breakfast., Disp: 90 tablet, Rfl: 0   glipiZIDE (GLUCOTROL XL) 10 MG 24 hr tablet, Take 10 mg by mouth 2 (two) times daily., Disp: , Rfl:    Insulin Degludec (TRESIBA FLEXTOUCH [place]), Inject 28 Units into the skin daily. , Disp: , Rfl:    Magnesium Oxide 400 MG CAPS, Take 1 capsule (400 mg total) by mouth in the morning and at bedtime., Disp: 180 capsule, Rfl: 0   metFORMIN (GLUCOPHAGE) 1000 MG tablet, Take 1,000 mg by mouth 2 (two) times daily with a meal., Disp: , Rfl:    metoprolol tartrate (LOPRESSOR) 25 MG tablet, Take 1 tablet (25 mg total) by mouth 2 (two) times daily., Disp: 60 tablet, Rfl: 2    nitroGLYCERIN (NITROSTAT) 0.4 MG SL tablet, Place 0.4 mg under the tongue every 5 (five) minutes as needed for chest pain., Disp: , Rfl:    pantoprazole (PROTONIX) 40 MG tablet, Take 40 mg by mouth daily., Disp: , Rfl:    rosuvastatin (CRESTOR) 5 MG tablet, Take 5 mg by mouth at bedtime., Disp: , Rfl:    sacubitril-valsartan (ENTRESTO) 49-51 MG, Take 1 tablet by mouth 2 (two) times daily., Disp: 180 tablet, Rfl: 0   aspirin EC 81 MG tablet, Take 81 mg by mouth daily., Disp: , Rfl:   Orders Placed This Encounter  Procedures   Basic metabolic panel   Magnesium   Pro b natriuretic peptide (BNP)   CBC   EKG 12-Lead    There are no Patient Instructions on file for this visit.   --Continue cardiac medications as reconciled in final medication list. --Return in about 23 days (around 03/06/2021) for Follow up, Post heart catheterization. Or sooner if needed. --Continue follow-up with your primary care physician regarding the management of your other chronic comorbid conditions.  Patient's questions and concerns were addressed to his satisfaction. He voices understanding of the instructions provided during this encounter.   This note was created using a voice recognition software as a result there may be grammatical errors inadvertently enclosed that do not reflect the nature of this encounter. Every attempt is made to correct such errors.  Rex Kras, [place], [facility]  Pager: [phone] Office: [phone]

## 2021-02-12 DIAGNOSIS — I517 Cardiomegaly: Secondary | ICD-10-CM | POA: Diagnosis not present

## 2021-02-12 DIAGNOSIS — I454 Nonspecific intraventricular block: Secondary | ICD-10-CM | POA: Diagnosis not present

## 2021-02-12 DIAGNOSIS — R079 Chest pain, unspecified: Secondary | ICD-10-CM | POA: Diagnosis not present

## 2021-02-12 DIAGNOSIS — I447 Left bundle-branch block, unspecified: Secondary | ICD-10-CM | POA: Diagnosis not present

## 2021-02-12 DIAGNOSIS — R9431 Abnormal electrocardiogram [ECG] [EKG]: Secondary | ICD-10-CM | POA: Diagnosis not present

## 2021-02-12 DIAGNOSIS — I4891 Unspecified atrial fibrillation: Secondary | ICD-10-CM | POA: Diagnosis not present

## 2021-02-13 ENCOUNTER — Telehealth: Payer: Self-pay | Admitting: Cardiology

## 2021-02-13 ENCOUNTER — Telehealth: Payer: Self-pay

## 2021-02-13 DIAGNOSIS — E1165 Type 2 diabetes mellitus with hyperglycemia: Secondary | ICD-10-CM | POA: Diagnosis not present

## 2021-02-13 DIAGNOSIS — I10 Essential (primary) hypertension: Secondary | ICD-10-CM | POA: Diagnosis not present

## 2021-02-13 NOTE — Telephone Encounter (Signed)
Patient was seen in [facility] emergency room on 9/29 night, was found to be in A. fib RVR.  He subsequently converted to sinus rhythm.  He underwent serial troponin evaluation given his chest pain, and was excluded for ACS.  No hospital admission necessary.  Keep current appointment for right and left heart catheterization, and scheduled.   Nigel Mormon, MD Pager: [phone] Office: [phone]

## 2021-02-13 NOTE — Telephone Encounter (Signed)
Thanks for addressing it.

## 2021-02-13 NOTE — Telephone Encounter (Signed)
Pt daughter said that the pt was sent to the emergency room in [place] last nigh around 12:00. Pt came home around 1 am and they gave the pt a nitroglycerin patch. The emergency room told him that if it gets any worse to have the patient go to [place] immediately. Pts daughter said that all the pt wants to do is sleep. Pts daughter would like to know what she should do. Pts daughter feels as though the pt should not have been let go so soon. Please advise.

## 2021-02-16 NOTE — Telephone Encounter (Signed)
Just FYI, they called on Friday and did not check this until late Friday evening

## 2021-02-17 NOTE — Telephone Encounter (Signed)
Please call the patient and find out how he is doing?

## 2021-02-18 ENCOUNTER — Other Ambulatory Visit: Payer: Self-pay | Admitting: Cardiology

## 2021-02-18 DIAGNOSIS — I5021 Acute systolic (congestive) heart failure: Secondary | ICD-10-CM | POA: Diagnosis not present

## 2021-02-19 LAB — CBC
Hematocrit: 46.6 % (ref 37.5–51.0)
Hemoglobin: 16 g/dL (ref 13.0–17.7)
MCH: 29.7 pg (ref 26.6–33.0)
MCHC: 34.3 g/dL (ref 31.5–35.7)
MCV: 87 fL (ref 79–97)
Platelets: 293 10*3/uL (ref 150–450)
RBC: 5.38 x10E6/uL (ref 4.14–5.80)
RDW: 13.8 % (ref 11.6–15.4)
WBC: 8.1 10*3/uL (ref 3.4–10.8)

## 2021-02-19 LAB — BASIC METABOLIC PANEL
BUN/Creatinine Ratio: 14 (ref 10–24)
BUN: 14 mg/dL (ref 8–27)
CO2: 23 mmol/L (ref 20–29)
Calcium: 9.9 mg/dL (ref 8.6–10.2)
Chloride: 101 mmol/L (ref 96–106)
Creatinine, Ser: 0.98 mg/dL (ref 0.76–1.27)
Glucose: 149 mg/dL — ABNORMAL HIGH (ref 70–99)
Potassium: 4.6 mmol/L (ref 3.5–5.2)
Sodium: 139 mmol/L (ref 134–144)
eGFR: 80 mL/min/{1.73_m2} (ref >59)

## 2021-02-19 LAB — MAGNESIUM: Magnesium: 1.7 mg/dL (ref 1.6–2.3)

## 2021-02-19 LAB — PRO B NATRIURETIC PEPTIDE: NT-Pro BNP: 2355 pg/mL — ABNORMAL HIGH (ref 0–486)

## 2021-02-19 NOTE — Telephone Encounter (Signed)
I spoke to patient he is doing ok patient stated he has sob and no energy at all. I asked patient if he has been checking his bp patient said no but he has been taking all his medication as directed just still having sob and very fatigue.

## 2021-02-19 NOTE — Telephone Encounter (Signed)
If he is still having CP, not feeling well, or low BP please have him come to [facility] ED today for evaluation. He has a cath planned for upcoming Tuesday.

## 2021-02-20 ENCOUNTER — Other Ambulatory Visit: Payer: Self-pay

## 2021-02-20 MED ORDER — FUROSEMIDE 20 MG PO TABS: 20.0000 mg | ORAL_TABLET | Freq: Every day | ORAL | 3 refills | Status: DC

## 2021-02-20 NOTE — Progress Notes (Signed)
Called and spoke to pt, pt voiced understanding. Furosemide 20 mg has been sent in. MAR has been updated.

## 2021-02-21 ENCOUNTER — Encounter (HOSPITAL_COMMUNITY): Payer: Self-pay | Admitting: Emergency Medicine

## 2021-02-21 ENCOUNTER — Emergency Department (HOSPITAL_COMMUNITY): Payer: Medicare PPO

## 2021-02-21 ENCOUNTER — Other Ambulatory Visit: Payer: Self-pay

## 2021-02-21 ENCOUNTER — Inpatient Hospital Stay (HOSPITAL_COMMUNITY)
Admission: EM | Admit: 2021-02-21 | Discharge: 2021-03-04 | DRG: 233 | Disposition: A | Discharge status: Home Health | Payer: Medicare PPO | Attending: Thoracic Surgery (Cardiothoracic Vascular Surgery) | Admitting: Thoracic Surgery (Cardiothoracic Vascular Surgery)

## 2021-02-21 DIAGNOSIS — I25110 Atherosclerotic heart disease of native coronary artery with unstable angina pectoris: Principal | ICD-10-CM | POA: Diagnosis present

## 2021-02-21 DIAGNOSIS — E118 Type 2 diabetes mellitus with unspecified complications: Secondary | ICD-10-CM

## 2021-02-21 DIAGNOSIS — J90 Pleural effusion, not elsewhere classified: Secondary | ICD-10-CM | POA: Diagnosis not present

## 2021-02-21 DIAGNOSIS — Z794 Long term (current) use of insulin: Secondary | ICD-10-CM | POA: Diagnosis not present

## 2021-02-21 DIAGNOSIS — Z681 Body mass index (BMI) 19 or less, adult: Secondary | ICD-10-CM

## 2021-02-21 DIAGNOSIS — R531 Weakness: Secondary | ICD-10-CM | POA: Diagnosis not present

## 2021-02-21 DIAGNOSIS — Z85820 Personal history of malignant melanoma of skin: Secondary | ICD-10-CM

## 2021-02-21 DIAGNOSIS — Z96652 Presence of left artificial knee joint: Secondary | ICD-10-CM | POA: Diagnosis present

## 2021-02-21 DIAGNOSIS — Z8249 Family history of ischemic heart disease and other diseases of the circulatory system: Secondary | ICD-10-CM | POA: Diagnosis not present

## 2021-02-21 DIAGNOSIS — Z79899 Other long term (current) drug therapy: Secondary | ICD-10-CM | POA: Diagnosis not present

## 2021-02-21 DIAGNOSIS — I340 Nonrheumatic mitral (valve) insufficiency: Secondary | ICD-10-CM | POA: Diagnosis not present

## 2021-02-21 DIAGNOSIS — I517 Cardiomegaly: Secondary | ICD-10-CM | POA: Diagnosis not present

## 2021-02-21 DIAGNOSIS — I4891 Unspecified atrial fibrillation: Secondary | ICD-10-CM | POA: Diagnosis present

## 2021-02-21 DIAGNOSIS — Z833 Family history of diabetes mellitus: Secondary | ICD-10-CM | POA: Diagnosis not present

## 2021-02-21 DIAGNOSIS — I255 Ischemic cardiomyopathy: Secondary | ICD-10-CM | POA: Diagnosis present

## 2021-02-21 DIAGNOSIS — I272 Pulmonary hypertension, unspecified: Secondary | ICD-10-CM | POA: Diagnosis not present

## 2021-02-21 DIAGNOSIS — Z87891 Personal history of nicotine dependence: Secondary | ICD-10-CM | POA: Diagnosis not present

## 2021-02-21 DIAGNOSIS — I2510 Atherosclerotic heart disease of native coronary artery without angina pectoris: Secondary | ICD-10-CM | POA: Diagnosis present

## 2021-02-21 DIAGNOSIS — I5020 Unspecified systolic (congestive) heart failure: Secondary | ICD-10-CM | POA: Diagnosis not present

## 2021-02-21 DIAGNOSIS — R0602 Shortness of breath: Secondary | ICD-10-CM | POA: Diagnosis not present

## 2021-02-21 DIAGNOSIS — I110 Hypertensive heart disease with heart failure: Secondary | ICD-10-CM | POA: Diagnosis not present

## 2021-02-21 DIAGNOSIS — E1165 Type 2 diabetes mellitus with hyperglycemia: Secondary | ICD-10-CM | POA: Diagnosis not present

## 2021-02-21 DIAGNOSIS — I5023 Acute on chronic systolic (congestive) heart failure: Secondary | ICD-10-CM | POA: Diagnosis not present

## 2021-02-21 DIAGNOSIS — E7800 Pure hypercholesterolemia, unspecified: Secondary | ICD-10-CM | POA: Diagnosis not present

## 2021-02-21 DIAGNOSIS — Z7901 Long term (current) use of anticoagulants: Secondary | ICD-10-CM | POA: Diagnosis not present

## 2021-02-21 DIAGNOSIS — Z7982 Long term (current) use of aspirin: Secondary | ICD-10-CM | POA: Diagnosis not present

## 2021-02-21 DIAGNOSIS — I200 Unstable angina: Secondary | ICD-10-CM | POA: Diagnosis not present

## 2021-02-21 DIAGNOSIS — Z801 Family history of malignant neoplasm of trachea, bronchus and lung: Secondary | ICD-10-CM

## 2021-02-21 DIAGNOSIS — I4821 Permanent atrial fibrillation: Secondary | ICD-10-CM | POA: Diagnosis not present

## 2021-02-21 DIAGNOSIS — I509 Heart failure, unspecified: Secondary | ICD-10-CM | POA: Diagnosis not present

## 2021-02-21 DIAGNOSIS — E440 Moderate protein-calorie malnutrition: Secondary | ICD-10-CM | POA: Insufficient documentation

## 2021-02-21 DIAGNOSIS — R079 Chest pain, unspecified: Secondary | ICD-10-CM | POA: Diagnosis not present

## 2021-02-21 DIAGNOSIS — Z7984 Long term (current) use of oral hypoglycemic drugs: Secondary | ICD-10-CM

## 2021-02-21 DIAGNOSIS — G4733 Obstructive sleep apnea (adult) (pediatric): Secondary | ICD-10-CM | POA: Diagnosis present

## 2021-02-21 DIAGNOSIS — Z01810 Encounter for preprocedural cardiovascular examination: Secondary | ICD-10-CM | POA: Diagnosis not present

## 2021-02-21 DIAGNOSIS — Z951 Presence of aortocoronary bypass graft: Secondary | ICD-10-CM

## 2021-02-21 DIAGNOSIS — I10 Essential (primary) hypertension: Secondary | ICD-10-CM

## 2021-02-21 DIAGNOSIS — I447 Left bundle-branch block, unspecified: Secondary | ICD-10-CM | POA: Diagnosis not present

## 2021-02-21 DIAGNOSIS — Z823 Family history of stroke: Secondary | ICD-10-CM

## 2021-02-21 DIAGNOSIS — I083 Combined rheumatic disorders of mitral, aortic and tricuspid valves: Secondary | ICD-10-CM | POA: Diagnosis present

## 2021-02-21 DIAGNOSIS — I4819 Other persistent atrial fibrillation: Secondary | ICD-10-CM | POA: Diagnosis not present

## 2021-02-21 DIAGNOSIS — I5021 Acute systolic (congestive) heart failure: Secondary | ICD-10-CM | POA: Diagnosis not present

## 2021-02-21 DIAGNOSIS — Z20822 Contact with and (suspected) exposure to covid-19: Secondary | ICD-10-CM | POA: Diagnosis not present

## 2021-02-21 DIAGNOSIS — I482 Chronic atrial fibrillation, unspecified: Secondary | ICD-10-CM | POA: Diagnosis not present

## 2021-02-21 DIAGNOSIS — J939 Pneumothorax, unspecified: Secondary | ICD-10-CM

## 2021-02-21 DIAGNOSIS — J9811 Atelectasis: Secondary | ICD-10-CM | POA: Diagnosis not present

## 2021-02-21 LAB — RESP PANEL BY RT-PCR (FLU A&B, COVID) ARPGX2
Influenza A by PCR: NEGATIVE
Influenza B by PCR: NEGATIVE
SARS Coronavirus 2 by RT PCR: NEGATIVE

## 2021-02-21 LAB — CBC
HCT: 49.6 % (ref 39.0–52.0)
Hemoglobin: 16.5 g/dL (ref 13.0–17.0)
MCH: 31 pg (ref 26.0–34.0)
MCHC: 33.3 g/dL (ref 30.0–36.0)
MCV: 93.2 fL (ref 80.0–100.0)
Platelets: 312 10*3/uL (ref 150–400)
RBC: 5.32 MIL/uL (ref 4.22–5.81)
RDW: 15.1 % (ref 11.5–15.5)
WBC: 7 10*3/uL (ref 4.0–10.5)
nRBC: 0 % (ref 0.0–0.2)

## 2021-02-21 LAB — BRAIN NATRIURETIC PEPTIDE: B Natriuretic Peptide: 487 pg/mL — ABNORMAL HIGH (ref 0.0–100.0)

## 2021-02-21 LAB — BASIC METABOLIC PANEL
Anion gap: 8 (ref 5–15)
BUN: 16 mg/dL (ref 8–23)
CO2: 23 mmol/L (ref 22–32)
Calcium: 9.7 mg/dL (ref 8.9–10.3)
Chloride: 103 mmol/L (ref 98–111)
Creatinine, Ser: 0.93 mg/dL (ref 0.61–1.24)
GFR, Estimated: >60 mL/min (ref >60)
Glucose, Bld: 176 mg/dL — ABNORMAL HIGH (ref 70–99)
Potassium: 4.6 mmol/L (ref 3.5–5.1)
Sodium: 134 mmol/L — ABNORMAL LOW (ref 135–145)

## 2021-02-21 LAB — TROPONIN I (HIGH SENSITIVITY)
Troponin I (High Sensitivity): 11 ng/L (ref <18)
Troponin I (High Sensitivity): 11 ng/L (ref <18)

## 2021-02-21 LAB — MAGNESIUM: Magnesium: 1.9 mg/dL (ref 1.7–2.4)

## 2021-02-21 LAB — GLUCOSE, CAPILLARY: Glucose-Capillary: 128 mg/dL — ABNORMAL HIGH (ref 70–99)

## 2021-02-21 LAB — TSH: TSH: 1.615 u[IU]/mL (ref 0.350–4.500)

## 2021-02-21 LAB — CBG MONITORING, ED: Glucose-Capillary: 173 mg/dL — ABNORMAL HIGH (ref 70–99)

## 2021-02-21 MED ORDER — ROSUVASTATIN CALCIUM 5 MG PO TABS
5.0000 mg | ORAL_TABLET | Freq: Every day | ORAL | Status: DC
  Administered 2021-02-21 – 2021-03-03 (×11): 5 mg via ORAL
  Filled 2021-02-21 (×11): qty 1

## 2021-02-21 MED ORDER — ASPIRIN 81 MG PO CHEW: 324.0000 mg | CHEWABLE_TABLET | ORAL | Status: DC

## 2021-02-21 MED ORDER — ISOSORBIDE MONONITRATE 20 MG PO TABS
20.0000 mg | ORAL_TABLET | Freq: Every day | ORAL | Status: DC
  Administered 2021-02-22 – 2021-02-25 (×4): 20 mg via ORAL
  Filled 2021-02-21 (×5): qty 1

## 2021-02-21 MED ORDER — DAPAGLIFLOZIN PROPANEDIOL 10 MG PO TABS
10.0000 mg | ORAL_TABLET | Freq: Every day | ORAL | Status: DC
  Administered 2021-02-22 – 2021-02-25 (×4): 10 mg via ORAL
  Filled 2021-02-21 (×5): qty 1

## 2021-02-21 MED ORDER — MAGNESIUM OXIDE -MG SUPPLEMENT 400 (240 MG) MG PO TABS
400.0000 mg | ORAL_TABLET | Freq: Three times a day (TID) | ORAL | Status: DC
  Administered 2021-02-21 – 2021-02-25 (×13): 400 mg via ORAL
  Filled 2021-02-21 (×13): qty 1

## 2021-02-21 MED ORDER — ACETAMINOPHEN 325 MG PO TABS: 650.0000 mg | ORAL_TABLET | ORAL | Status: DC | PRN

## 2021-02-21 MED ORDER — ACETAMINOPHEN 500 MG PO TABS
1000.0000 mg | ORAL_TABLET | Freq: Four times a day (QID) | ORAL | Status: DC | PRN
  Administered 2021-02-25 (×2): 1000 mg via ORAL
  Filled 2021-02-21 (×2): qty 2

## 2021-02-21 MED ORDER — AMIODARONE HCL IN DEXTROSE 360-4.14 MG/200ML-% IV SOLN
30.0000 mg/h | INTRAVENOUS | Status: DC
  Administered 2021-02-22 – 2021-02-27 (×10): 30 mg/h via INTRAVENOUS
  Filled 2021-02-21 (×13): qty 200

## 2021-02-21 MED ORDER — ONDANSETRON HCL 4 MG/2ML IJ SOLN: 4.0000 mg | Freq: Four times a day (QID) | INTRAMUSCULAR | Status: DC | PRN

## 2021-02-21 MED ORDER — METOPROLOL TARTRATE 25 MG PO TABS
25.0000 mg | ORAL_TABLET | Freq: Two times a day (BID) | ORAL | Status: DC
  Administered 2021-02-21 – 2021-02-25 (×9): 25 mg via ORAL
  Filled 2021-02-21 (×9): qty 1

## 2021-02-21 MED ORDER — AMIODARONE HCL IN DEXTROSE 360-4.14 MG/200ML-% IV SOLN
60.0000 mg/h | INTRAVENOUS | Status: AC
  Administered 2021-02-21 (×2): 60 mg/h via INTRAVENOUS
  Filled 2021-02-21: qty 200

## 2021-02-21 MED ORDER — ASPIRIN EC 81 MG PO TBEC
81.0000 mg | DELAYED_RELEASE_TABLET | Freq: Every day | ORAL | Status: DC
  Administered 2021-02-22 – 2021-02-25 (×4): 81 mg via ORAL
  Filled 2021-02-21 (×5): qty 1

## 2021-02-21 MED ORDER — ENSURE ENLIVE PO LIQD
237.0000 mL | Freq: Two times a day (BID) | ORAL | Status: DC
  Administered 2021-02-22 – 2021-03-03 (×15): 237 mL via ORAL

## 2021-02-21 MED ORDER — NITROGLYCERIN 0.4 MG SL SUBL
0.4000 mg | SUBLINGUAL_TABLET | SUBLINGUAL | Status: DC | PRN
  Administered 2021-02-22 (×2): 0.4 mg via SUBLINGUAL
  Filled 2021-02-21 (×2): qty 1

## 2021-02-21 MED ORDER — ASPIRIN 300 MG RE SUPP
300.0000 mg | RECTAL | Status: DC
  Filled 2021-02-21: qty 1

## 2021-02-21 MED ORDER — SACUBITRIL-VALSARTAN 49-51 MG PO TABS
1.0000 | ORAL_TABLET | Freq: Two times a day (BID) | ORAL | Status: DC
  Administered 2021-02-21 – 2021-02-25 (×9): 1 via ORAL
  Filled 2021-02-21 (×10): qty 1

## 2021-02-21 MED ORDER — INFLUENZA VAC A&B SA ADJ QUAD 0.5 ML IM PRSY
0.5000 mL | PREFILLED_SYRINGE | INTRAMUSCULAR | Status: DC
  Filled 2021-02-21 (×2): qty 0.5

## 2021-02-21 MED ORDER — INSULIN ASPART 100 UNIT/ML IJ SOLN
0.0000 [IU] | Freq: Three times a day (TID) | INTRAMUSCULAR | Status: DC
  Administered 2021-02-22 (×3): 2 [IU] via SUBCUTANEOUS
  Administered 2021-02-23 – 2021-02-24 (×3): 3 [IU] via SUBCUTANEOUS
  Administered 2021-02-24: 2 [IU] via SUBCUTANEOUS
  Administered 2021-02-24 – 2021-02-25 (×2): 5 [IU] via SUBCUTANEOUS
  Administered 2021-02-25: 2 [IU] via SUBCUTANEOUS
  Administered 2021-02-25: 3 [IU] via SUBCUTANEOUS

## 2021-02-21 MED ORDER — FAMOTIDINE 20 MG PO TABS
40.0000 mg | ORAL_TABLET | Freq: Every day | ORAL | Status: DC
  Administered 2021-02-22 – 2021-02-25 (×4): 40 mg via ORAL
  Filled 2021-02-21 (×4): qty 2

## 2021-02-21 MED ORDER — PANTOPRAZOLE SODIUM 40 MG PO TBEC
40.0000 mg | DELAYED_RELEASE_TABLET | Freq: Every day | ORAL | Status: DC
  Administered 2021-02-22 – 2021-02-25 (×4): 40 mg via ORAL
  Filled 2021-02-21 (×4): qty 1

## 2021-02-21 MED ORDER — ALUM & MAG HYDROXIDE-SIMETH 200-200-20 MG/5ML PO SUSP
30.0000 mL | ORAL | Status: DC | PRN
  Administered 2021-02-22: 30 mL via ORAL

## 2021-02-21 MED ORDER — NITROGLYCERIN 0.4 MG SL SUBL: 0.4000 mg | SUBLINGUAL_TABLET | SUBLINGUAL | Status: DC | PRN

## 2021-02-21 MED ORDER — DILTIAZEM HCL 25 MG/5ML IV SOLN
10.0000 mg | Freq: Once | INTRAVENOUS | Status: AC
  Administered 2021-02-21: 10 mg via INTRAVENOUS
  Filled 2021-02-21: qty 5

## 2021-02-21 MED ORDER — ASPIRIN EC 81 MG PO TBEC: 81.0000 mg | DELAYED_RELEASE_TABLET | Freq: Every day | ORAL | Status: DC

## 2021-02-21 MED ORDER — INSULIN ASPART 100 UNIT/ML IJ SOLN
0.0000 [IU] | Freq: Every day | INTRAMUSCULAR | Status: DC
  Administered 2021-02-22: 3 [IU] via SUBCUTANEOUS
  Administered 2021-02-23: 2 [IU] via SUBCUTANEOUS
  Administered 2021-02-24: 3 [IU] via SUBCUTANEOUS
  Administered 2021-02-25: 2 [IU] via SUBCUTANEOUS

## 2021-02-21 NOTE — ED Provider Notes (Signed)
[facility] EMERGENCY DEPARTMENT Provider Note   CSN: [id number] Arrival date & time: 02/21/21  1335     History Chief Complaint  Patient presents with   Chest Pain    Derek Bond is a 75 y.o. male with a past medical history of CAD, hypertension, type 2 diabetes.  Patient gives history that he has been having worsening weakness and shortness of breath for about 1 month.  He saw his primary care doctor twice who then sent him to see Dr. Virgina Jock at [facility] cardiovascular.  He states that 2 weeks ago he was diagnosed with A. fib at his clinic and started on Eliquis which she has been compliant with anticoagulation therapy.  He states that he also was volume overloaded due to his EF of 25% and was started on Lasix at which point he diuresed 7 pounds of fluid.  Patient states that he has persistent weakness and shortness of breath.  He is unable to complete work or ADLs due to getting winded and tired very easily.  He has some twinges of pain at times in his chest but has none at this time.  The history is provided by the patient.  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    Due to laceration at age 27    Hiatal hernia    History of melanoma    Obstructive sleep apnea    Type 2 diabetes mellitus [facility])     Patient Active Problem List   Diagnosis Date Noted   Atrial fibrillation with RVR ([place]) 02/21/2021   Rectal bleeding 11/28/2019   Loose stools 11/28/2019    Past Surgical History:  Procedure Laterality Date   BIOPSY  12/21/2019   Procedure: BIOPSY;  Surgeon: Harvel Quale, MD;  Location: [facility] ENDO SUITE;  Service: Gastroenterology;;  random colon   COLONOSCOPY WITH PROPOFOL N/A 12/21/2019   Procedure: COLONOSCOPY WITH PROPOFOL;  Surgeon: Harvel Quale, MD;   Location: [facility] ENDO SUITE;  Service: Gastroenterology;  Laterality: N/A;  915   MELANOMA EXCISION     Left facial region   REPLACEMENT TOTAL KNEE Left    UMBILICAL HERNIA REPAIR         Family History  Problem Relation Age of Onset   Diabetes Mother        Died in a house fire   Lung cancer Father    Hypertension Sister    Hypertension Sister    Hypertension Brother    Diabetes Brother    Hypertension Brother    Hypertension Other    Diabetes Other    Rheum arthritis Other    Stroke Other     Social History   Tobacco Use   Smoking status: Former    Packs/day: 2.00    Years: 30.00    Pack years: 60.00    Types: Cigarettes    Quit date: 05/17/1989    Years since quitting: 31.7   Smokeless tobacco: Never  Vaping Use   Vaping Use: Never used  Substance Use Topics   Alcohol use: Not Currently   Drug use: No    Home Medications Prior to Admission medications   Medication Sig Start Date End Date Taking? Authorizing Provider  acetaminophen (TYLENOL) 500 MG tablet Take 1,000 mg  by mouth every 6 (six) hours as needed for moderate pain or mild pain.    [provider]  amLODipine (NORVASC) 5 MG tablet Take 5 mg by mouth daily. 12/25/20   [provider]  apixaban (ELIQUIS) 5 MG TABS tablet Take 1 tablet (5 mg total) by mouth 2 (two) times daily. 02/02/21 05/03/21  Tolia, Sunit, DO  aspirin EC 81 MG tablet Take 81 mg by mouth daily.    [provider]  candesartan (ATACAND) 32 MG tablet Take 32 mg by mouth daily. 12/28/20   [provider]  dapagliflozin propanediol (FARXIGA) 10 MG TABS tablet Take 1 tablet (10 mg total) by mouth daily before breakfast. 02/11/21 05/12/21  Tolia, Sunit, DO  famotidine (PEPCID) 40 MG tablet Take 40 mg by mouth daily. 01/12/21   [provider]  furosemide (LASIX) 20 MG tablet Take 1 tablet (20 mg total) by mouth daily. 02/20/21 05/21/21  Tolia, Sunit, DO  glipiZIDE (GLUCOTROL XL) 10 MG 24 hr tablet Take 10 mg  by mouth 2 (two) times daily.    [provider]  Insulin Degludec (TRESIBA FLEXTOUCH [place]) Inject 48 Units into the skin daily.    [provider]  isosorbide mononitrate (ISMO) 20 MG tablet Take 20 mg by mouth daily. 01/15/21   [provider]  Magnesium Oxide 400 MG CAPS Take 1 capsule (400 mg total) by mouth in the morning and at bedtime. Patient taking differently: Take 400 mg by mouth 3 (three) times daily. 01/30/21 04/30/21  Tolia, Sunit, DO  metFORMIN (GLUCOPHAGE) 1000 MG tablet Take 1,000 mg by mouth 2 (two) times daily with a meal.    [provider]  metoprolol tartrate (LOPRESSOR) 25 MG tablet Take 1 tablet (25 mg total) by mouth 2 (two) times daily. 01/26/21 04/26/21  Tolia, Sunit, DO  nitroGLYCERIN (NITROSTAT) 0.4 MG SL tablet Place 0.4 mg under the tongue every 5 (five) minutes as needed for chest pain.    [provider]  pantoprazole (PROTONIX) 40 MG tablet Take 40 mg by mouth daily.    [provider]  rosuvastatin (CRESTOR) 5 MG tablet Take 5 mg by mouth at bedtime.    [provider]  sacubitril-valsartan (ENTRESTO) 49-51 MG Take 1 tablet by mouth 2 (two) times daily. 01/30/21 04/30/21  Rex Kras, DO    Allergies    Patient has no known allergies.  Review of Systems   Review of Systems  Cardiovascular:  Positive for chest pain.  Ten systems reviewed and are negative for acute change, except as noted in the HPI.   Physical Exam Updated Vital Signs BP 135/79   Pulse 68   Temp 98 F (36.7 C)   Resp 16   Ht 6\' 6"  (1.981 m)   Wt 99.3 kg   SpO2 98%   BMI 25.31 kg/m   Physical Exam Vitals and nursing note reviewed.  Constitutional:      General: He is not in acute distress.    Appearance: He is well-developed. He is not diaphoretic.  HENT:     Head: Normocephalic and atraumatic.  Eyes:     General: No scleral icterus.    Conjunctiva/sclera: Conjunctivae normal.  Cardiovascular:     Rate and Rhythm:  Tachycardia present. Rhythm regularly irregular.     Heart sounds: Normal heart sounds.  Pulmonary:     Effort: Pulmonary effort is normal. No respiratory distress.     Breath sounds: Normal breath sounds.  Abdominal:  Palpations: Abdomen is soft.     Tenderness: There is no abdominal tenderness.  Musculoskeletal:     Cervical back: Normal range of motion and neck supple.  Skin:    General: Skin is warm and dry.  Neurological:     Mental Status: He is alert.  Psychiatric:        Behavior: Behavior normal.    ED Results / Procedures / Treatments   Labs (all labs ordered are listed, but only abnormal results are displayed) Labs Reviewed  BASIC METABOLIC PANEL - Abnormal; Notable for the following components:      Result Value   Sodium 134 (*)    Glucose, Bld 176 (*)    All other components within normal limits  BRAIN NATRIURETIC PEPTIDE - Abnormal; Notable for the following components:   B Natriuretic Peptide 487.0 (*)    All other components within normal limits  CBG MONITORING, ED - Abnormal; Notable for the following components:   Glucose-Capillary 173 (*)    All other components within normal limits  RESP PANEL BY RT-PCR (FLU A&B, COVID) ARPGX2  CBC  TSH  MAGNESIUM  TROPONIN I (HIGH SENSITIVITY)  TROPONIN I (HIGH SENSITIVITY)    EKG EKG Interpretation  Date/Time:  Saturday February 21 2021 13:57:17 EDT Ventricular Rate:  102 PR Interval:    QRS Duration: 143 QT Interval:  368 QTC Calculation: 480 R Axis:   -62 Text Interpretation: Atrial fibrillation Nonspecific IVCD with LAD LVH with secondary repolarization abnormality Anterior infarct, old Since last tracing Atrial fibrillation has replaced Sinus rhythm Confirmed by Calvert Cantor [phone]) on 02/21/2021 2:08:08 PM  Radiology DG Chest 2 View  Result Date: 02/21/2021 CLINICAL DATA:  Chest pain and heaviness, nonradiating. Shortness of breath. Symptoms for 1 month. History coronary artery disease, former  smoker, hypertension, melanoma, type II diabetes mellitus EXAM: CHEST - 2 VIEW COMPARISON:  03/14/2021 FINDINGS: Enlargement of cardiac silhouette. Mediastinal contours and pulmonary vascularity normal. Lungs clear. No infiltrate, pleural effusion, or pneumothorax. Osseous structures unremarkable. IMPRESSION: Enlargement of cardiac silhouette. No acute abnormalities. Electronically Signed   By: Lavonia Dana M.D.   On: 02/21/2021 14:25    Procedures .Critical Care Performed by: Margarita Mail, PA-C Authorized by: Margarita Mail, PA-C   Critical care provider statement:    Critical care time (minutes):  50   Critical care time was exclusive of:  Separately billable procedures and treating other patients   Critical care was necessary to treat or prevent imminent or life-threatening deterioration of the following conditions:  Cardiac failure   Critical care was time spent personally by me on the following activities:  Discussions with consultants, evaluation of patient's response to treatment, examination of patient, ordering and performing treatments and interventions, ordering and review of laboratory studies, ordering and review of radiographic studies, pulse oximetry, re-evaluation of patient's condition, obtaining history from patient or surrogate and review of old charts   Medications Ordered in ED Medications  amiodarone (NEXTERONE PREMIX) 360-4.14 MG/200ML-% (1.8 mg/mL) IV infusion (60 mg/hr Intravenous New Bag/Given 02/21/21 1616)  amiodarone (NEXTERONE PREMIX) 360-4.14 MG/200ML-% (1.8 mg/mL) IV infusion (has no administration in time range)  diltiazem (CARDIZEM) injection 10 mg (10 mg Intravenous Given 02/21/21 1517)    ED Course  I have reviewed the triage vital signs and the nursing notes.  Pertinent labs & imaging results that were available during my care of the patient were reviewed by me and considered in my medical decision making (see chart for details).    MDM  Rules/Calculators/A&P                           CC:sob VS:  Vitals:   02/21/21 1645 02/21/21 1700 02/21/21 1715 02/21/21 1720  BP: 124/77 (!) 132/95 (!) 132/102 135/79  Pulse: 87 64 61 68  Resp: 16 12 15 16   Temp:      SpO2: 96% 98% 97% 98%  Weight:      Height:        GU:RKYHCWC is gathered by patient and emr. Previous records obtained and reviewed. DDX:The patient's complaint of sob involves an extensive number of diagnostic and treatment options, and is a complaint that carries with it a high risk of complications, morbidity, and potential mortality. Given the large differential diagnosis, medical decision making is of high complexity. The emergent differential diagnosis for shortness of breath includes, but is not limited to, Pulmonary edema, bronchoconstriction, Pneumonia, Pulmonary embolism, Pneumotherax/ Hemothorax, Dysrythmia, ACS.   Labs: I ordered reviewed and interpreted labs which include CBC with no abnormalities, BMP shows elevated blood glucose of with mild reflexive pseudo  hyponatremia BNP at 487, troponin within normal limits, respiratory panel negative for COVID or influenza, magnesium within normal limits and TSH within normal limits. Imaging: I ordered and reviewed images which included 2 view chest x-ray. I independently visualized and interpreted all imaging. There are no acute, significant findings on today's images. EKG: Shows A. fib with RVR at a rate of 102 Consults: Dr. Terri Skains of [facility] cardiovascular services.  He feels since the patient has had 2 recent ER visits for the same problem he likely needs admission. MDM: Patient here with A. fib with RVR.  He was given diltiazem but had recurrence of rapid rate.  Patient placed on amiodarone infusion and will need transfer to [facility].  Case discussed with Dr. Terri Skains.  I have placed bed request and temporary admission orders for [facility] cardiac telemetry.  Patient aware of plans and in agreement. Patient  disposition:The patient appears reasonably stabilized for admission considering the current resources, flow, and capabilities available in the ED at this time, and I doubt any other [facility] requiring further screening and/or treatment in the ED prior to admission.       Final Clinical Impression(s) / ED Diagnoses Final diagnoses:  Atrial fibrillation with rapid ventricular response [facility])    Rx / DC Orders ED Discharge Orders          Ordered    Amb referral to AFIB Clinic        02/21/21 1509             Margarita Mail, PA-C 02/21/21 1848    Truddie Hidden, MD 02/22/21 [id number]

## 2021-02-21 NOTE — ED Triage Notes (Signed)
Pt to the ED with chest pain described as heaviness and non radiating. Pt also complains of shortness of breath for the past month and has a cardiac cath scheduled for Tuesday of next week.

## 2021-02-21 NOTE — Progress Notes (Signed)
ON-CALL CARDIOLOGY 02/21/21  Patient's name: Derek Bond.   MRN: [id number].    DOB: Apr 07, 1946 Primary care provider: Glenda Chroman, MD. Primary cardiologist: Rex Kras, DO, [facility]  Interaction regarding this patient's care today: Transfer from [facility] pain, shortness of breath, and decreased energy.  Patient has recently establish care with our practice and was diagnosed with new onset of atrial fibrillation and heart failure with reduced EF.  Patient is scheduled for an elective heart catheterization on 02/24/2021.  However, he presented to me [facility] earlier today with symptoms of chest pain and shortness of breath.  Patient states that this morning he was having some chest discomfort which he describes as intermittent indigestion/tightness, substernally located, initially 6 out of 10 in intensity, nonradiating, more noticeable with effort related activities, and did not resolve with rest.  Because his symptoms were not improving he went to [facility] for further evaluation and management.  Including was noted to show atrial fibrillation without underlying injury pattern.  High sensitive troponins essentially flat and negative and BNP elevated.  Initial plan was to discharge him from [facility]; however, spoke to Margarita Mail who requested that he be transferred to [facility] for more expedited evaluation.  This is his second ER for similar symptoms.   Patient was transferred to [facility] later tonight spoke to him over the phone.  He states that he is not having any chest pain and shortness of breath is relatively stable.  His RN also reports that he is hemodynamically stable, not in florid heart failure, and no angina.   Impression: Unstable angina Symptomatic persistent atrial fibrillation Acute HFrEF  Recommendations: Start IV Heparin drip for UA.  Continue IV Amiodarone drip  Restart home medications.  Telemetry  No active  chest pain; however, patient is informed to notify RN if his CP resurfaces. If this occurs will consider IV nitro drip if BP allows, will need to re-trend troponin, and serial EKGs.  Formal H&P to follow in morning.  Spoke to the patient and his RN over the phone.    Telephone encounter total time: 40 minutes   Mechele Claude [facility]  Pager: [phone] Office: [phone]

## 2021-02-22 DIAGNOSIS — I447 Left bundle-branch block, unspecified: Secondary | ICD-10-CM

## 2021-02-22 DIAGNOSIS — Z794 Long term (current) use of insulin: Secondary | ICD-10-CM

## 2021-02-22 DIAGNOSIS — E7800 Pure hypercholesterolemia, unspecified: Secondary | ICD-10-CM

## 2021-02-22 DIAGNOSIS — E118 Type 2 diabetes mellitus with unspecified complications: Secondary | ICD-10-CM

## 2021-02-22 DIAGNOSIS — I10 Essential (primary) hypertension: Secondary | ICD-10-CM

## 2021-02-22 DIAGNOSIS — I5023 Acute on chronic systolic (congestive) heart failure: Secondary | ICD-10-CM

## 2021-02-22 LAB — BASIC METABOLIC PANEL
Anion gap: 9 (ref 5–15)
BUN: 14 mg/dL (ref 8–23)
CO2: 23 mmol/L (ref 22–32)
Calcium: 9.9 mg/dL (ref 8.9–10.3)
Chloride: 103 mmol/L (ref 98–111)
Creatinine, Ser: 1.19 mg/dL (ref 0.61–1.24)
GFR, Estimated: >60 mL/min (ref >60)
Glucose, Bld: 133 mg/dL — ABNORMAL HIGH (ref 70–99)
Potassium: 4 mmol/L (ref 3.5–5.1)
Sodium: 135 mmol/L (ref 135–145)

## 2021-02-22 LAB — CBC
HCT: 47 % (ref 39.0–52.0)
Hemoglobin: 15.9 g/dL (ref 13.0–17.0)
MCH: 30.5 pg (ref 26.0–34.0)
MCHC: 33.8 g/dL (ref 30.0–36.0)
MCV: 90.2 fL (ref 80.0–100.0)
Platelets: 299 10*3/uL (ref 150–400)
RBC: 5.21 MIL/uL (ref 4.22–5.81)
RDW: 15 % (ref 11.5–15.5)
WBC: 7.7 10*3/uL (ref 4.0–10.5)
nRBC: 0 % (ref 0.0–0.2)

## 2021-02-22 LAB — LIPID PANEL
Cholesterol: 99 mg/dL (ref 0–200)
HDL: 44 mg/dL (ref >40)
LDL Cholesterol: 44 mg/dL (ref 0–99)
Total CHOL/HDL Ratio: 2.3 RATIO
Triglycerides: 56 mg/dL (ref <150)
VLDL: 11 mg/dL (ref 0–40)

## 2021-02-22 LAB — APTT
aPTT: 57 seconds — ABNORMAL HIGH (ref 24–36)
aPTT: 110 seconds — ABNORMAL HIGH (ref 24–36)

## 2021-02-22 LAB — GLUCOSE, CAPILLARY
Glucose-Capillary: 163 mg/dL — ABNORMAL HIGH (ref 70–99)
Glucose-Capillary: 168 mg/dL — ABNORMAL HIGH (ref 70–99)
Glucose-Capillary: 173 mg/dL — ABNORMAL HIGH (ref 70–99)
Glucose-Capillary: 276 mg/dL — ABNORMAL HIGH (ref 70–99)

## 2021-02-22 LAB — TSH: TSH: 2.348 u[IU]/mL (ref 0.350–4.500)

## 2021-02-22 LAB — HEMOGLOBIN A1C
Hgb A1c MFr Bld: 8.1 % — ABNORMAL HIGH (ref 4.8–5.6)
Mean Plasma Glucose: 185.77 mg/dL

## 2021-02-22 LAB — HEPARIN LEVEL (UNFRACTIONATED)
Heparin Unfractionated: 0.76 IU/mL — ABNORMAL HIGH (ref 0.30–0.70)
Heparin Unfractionated: 1.02 IU/mL — ABNORMAL HIGH (ref 0.30–0.70)

## 2021-02-22 MED ORDER — SODIUM CHLORIDE 0.9% FLUSH: 3.0000 mL | INTRAVENOUS | Status: DC | PRN

## 2021-02-22 MED ORDER — SODIUM CHLORIDE 0.9 % IV SOLN: INTRAVENOUS | Status: DC

## 2021-02-22 MED ORDER — HEPARIN (PORCINE) 25000 UT/250ML-% IV SOLN
1500.0000 [IU]/h | INTRAVENOUS | Status: DC
  Administered 2021-02-22: 1400 [IU]/h via INTRAVENOUS
  Administered 2021-02-22: 1600 [IU]/h via INTRAVENOUS
  Filled 2021-02-22 (×2): qty 250

## 2021-02-22 MED ORDER — SODIUM CHLORIDE 0.9 % IV SOLN: 250.0000 mL | INTRAVENOUS | Status: DC | PRN

## 2021-02-22 MED ORDER — SODIUM CHLORIDE 0.9% FLUSH
3.0000 mL | Freq: Two times a day (BID) | INTRAVENOUS | Status: DC
  Administered 2021-02-23 (×2): 3 mL via INTRAVENOUS

## 2021-02-22 MED ORDER — NITROGLYCERIN IN D5W 200-5 MCG/ML-% IV SOLN: 0.0000 ug/min | INTRAVENOUS | Status: DC

## 2021-02-22 NOTE — Progress Notes (Signed)
   02/21/21 2300  Clinical Encounter Type  Visited With Patient  Visit Type Initial;Psychological support;Spiritual support;Social support  Referral From Nurse;Patient  Stress Factors  Patient Stress Factors Loss;Major life changes;Health changes    [place] visited pt. per [facility] consult for prayer; pt. awake and recently arrived from transfer from [facility].  Pt. says he has been experiencing chest pain and weakness and was already scheduled for cardiac cath this week but then went to the ED per recommendation of his doctor.  Pt. coping well, anticipating cath procedure being moved slightly earlier.  Pt. shared that he lost his wife of 50+ years two years ago; wife had received double lung transplant and was only expected to live 1-60yrs but lived another 29yrs, finally succumbing to what pt. believes might have been an early case of COVID.  Pt. has good support from his children and grandchildren and he says that he was very active until the last few weeks when he began experiencing unexplained weakness.  [facility] offered supportive listening and prayer.  Chaplains remain available as needed.  Lindaann Pascal, Chaplain Pager: [phone]

## 2021-02-22 NOTE — Progress Notes (Signed)
ANTICOAGULATION CONSULT NOTE - Follow Up Consult  Pharmacy Consult for heparin (Apixaban on holding pending heart cath on 02/24/21) Indication: chest pain/ACS and atrial fibrillation  No Known Allergies  Patient Measurements: Height: 6\' 6"  (198.1 cm) Weight: 99.3 kg (219 lb) IBW/kg (Calculated) : 91.4 Heparin Dosing Weight: 99.3 kg   Vital Signs: Temp: 97.6 F (36.4 C) (10/09 0746) Temp Source: Oral (10/09 0746) BP: 138/98 (10/09 0746) Pulse Rate: 92 (10/09 0746)  Labs: Recent Labs    02/21/21 1347 02/21/21 1545 02/22/21 0131 02/22/21 0847  HGB 16.5  --  15.9  --   HCT 49.6  --  47.0  --   PLT 312  --  299  --   HEPARINUNFRC  --   --   --  0.76*  CREATININE 0.93  --  1.19  --   TROPONINIHS 11 11  --   --     Estimated Creatinine Clearance: 69.3 mL/min (by C-G formula based on SCr of 1.19 mg/dL).   Assessment: 29 YOM on apixaban PTA for afib (started ~2 weeks ago for new onset a fib). Presented to the ED with chest pain and shortness of breath, holding apixaban and starting heparin. Anticipate using aPTT to dose for now.  aPTT is below goal at 57 on current rate (1400 units/hr). CBC stable. Per conversation with RN, no s/sx of bleeding or issues with infusion. CBC stable.   Goal of Therapy:  Heparin level 0.3-0.7 units/ml aPTT 66-102 seconds Monitor platelets by anticoagulation protocol: Yes   Plan:  Increase heparin gtt to 1600 units/hr Check aPTT in 8 hours and daily until heparin level and aPTT correlate Monitor CBC and s/sx of bleeding  Pauletta Browns, Pharm.D. PGY-1 Pharmacy Resident [phone] 02/22/2021 10:07 AM

## 2021-02-22 NOTE — Progress Notes (Signed)
ANTICOAGULATION CONSULT NOTE - Initial Consult  Pharmacy Consult for Heparin (Apixaban on hold) Indication: chest pain/ACS and atrial fibrillation  No Known Allergies  Patient Measurements: Height: 6\' 6"  (198.1 cm) Weight: 99.3 kg (219 lb) IBW/kg (Calculated) : 91.4   Vital Signs: Temp: 97.8 F (36.6 C) (10/08 2337) Temp Source: Oral (10/08 2337) BP: 157/102 (10/08 2337) Pulse Rate: 94 (10/08 2337)  Labs: Recent Labs    02/21/21 1347 02/21/21 1545  HGB 16.5  --   HCT 49.6  --   PLT 312  --   CREATININE 0.93  --   TROPONINIHS 11 11    Estimated Creatinine Clearance: 88.7 mL/min (by C-G formula based on SCr of 0.93 mg/dL).   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 Anticipate using aPTT to dose for now.   Goal of Therapy:  Heparin level 0.3-0.7 units/ml aPTT 66-102 seconds Monitor platelets by anticoagulation protocol: Yes   Plan:  Start heparin drip at 1400 units/hr 0900 Heparin level and aPTT Daily CBC/Heparin level/aPTT Monitor for bleeding  Narda Bonds, PharmD, BCPS Clinical Pharmacist Phone: [phone]

## 2021-02-22 NOTE — Progress Notes (Signed)
ANTICOAGULATION CONSULT NOTE - Follow Up Consult  Pharmacy Consult for heparin (Apixaban on holding pending heart cath on 02/24/21) Indication: chest pain/ACS and atrial fibrillation  No Known Allergies  Patient Measurements: Height: 6\' 6"  (198.1 cm) Weight: 99.3 kg (219 lb) IBW/kg (Calculated) : 91.4 Heparin Dosing Weight: 99.3 kg   Vital Signs: Temp: 97.9 F (36.6 C) (10/09 1617) Temp Source: Oral (10/09 1617) BP: 118/87 (10/09 1617) Pulse Rate: 85 (10/09 1617)  Labs: Recent Labs    02/21/21 1347 02/21/21 1545 02/22/21 0131 02/22/21 0847 02/22/21 1807  HGB 16.5  --  15.9  --   --   HCT 49.6  --  47.0  --   --   PLT 312  --  299  --   --   APTT  --   --   --  57* 110*  HEPARINUNFRC  --   --   --  0.76* 1.02*  CREATININE 0.93  --  1.19  --   --   TROPONINIHS 11 11  --   --   --      Estimated Creatinine Clearance: 69.3 mL/min (by C-G formula based on SCr of 1.19 mg/dL).   Assessment: 63 YOM on apixaban PTA for afib (started ~2 weeks ago for new onset a fib). Presented to the ED with chest pain and shortness of breath, holding apixaban and on heparin.  -aPTT just above goal on 1600 units/hr  Goal of Therapy:  Heparin level 0.3-0.7 units/ml aPTT 66-102 seconds Monitor platelets by anticoagulation protocol: Yes   Plan:  -decrease heparin to 1500 units/hr -Daily heparin level and CBC  [name], PharmD Clinical Pharmacist **Pharmacist phone directory can now be found on [email or web address] ([email or web address]).  Listed under [place].

## 2021-02-22 NOTE — H&P (Addendum)
CARDIOLOGY CONSULT NOTE  Patient ID: Derek Bond MRN: [id number] DOB/AGE: 1945/06/30 75 y.o.  Admit date: 02/21/2021 Primary Physician:  Glenda Chroman, MD Chief complaint: Chest pain  Patient ID: Derek Bond, male    DOB: 1946-03-17, 75 y.o.   MRN: [id number]  Chief Complaint  Patient presents with   Chest Pain   HPI:    Derek Bond  is a 75 y.o. Caucasian male with history of cardiomyopathy, chronic HFrEF, hypertension, diabetes mellitus, hyperlipidemia, former smoker, sleep apnea (not on CPAP), persistent atrial fibrillation, and erectile dysfunction.  Patient was initially seen in our office for shortness of breath, EKG 01/26/2021 revealed new diagnosis of atrial fibrillation and subsequent echocardiogram noted severely reduced LVEF of 25-30% with global hypokinesis.  Patient was scheduled for elective cardiac catheterization this Tuesday 02/24/2021.  However he presented to [facility] emergency department yesterday with chest pain, shortness of breath, and fatigue.  Work-up in the emergency department noted EKG showing atrial fibrillation without underlying injury pattern, serial troponins negative and flat, BNP elevated at 487.  Given patient's multiple cardiovascular risk factors, multiple ER visit for similar concerns, and ongoing symptoms decision was to admit patient for further evaluation and management, therefore patient was transferred to [facility].  Patient is seen and examined this morning.  He is presently on amiodarone and heparin infusions.  Patient reports chest pain has significantly improved since presentation to [facility] yesterday, reporting a single brief episode this morning of central chest pain.  Also reports improvement of shortness of breath.  Denies orthopnea, PND, leg swelling, dizziness, syncope, near syncope.  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    Due to laceration at age 41    Hiatal hernia    History of melanoma    Obstructive sleep apnea    Type 2 diabetes mellitus ([place])    Past Surgical History:  Procedure Laterality Date   BIOPSY  12/21/2019   Procedure: BIOPSY;  Surgeon: Harvel Quale, MD;  Location: [facility] ENDO SU[facility];  Service: Gastroenterology;;  random colon   COLONOSCOPY WITH PROPOFOL N/A 12/21/2019   Procedure: COLONOSCOPY WITH PROPOFOL;  Surgeon: Harvel Quale, MD;  Location: [facility];  Service: Gastroenterology;  Laterality: N/A;  915   MELANOMA EXCISION     Left facial region   REPLACEMENT TOTAL KNEE Left    UMBILICAL HERNIA REPAIR     Family History  Problem Relation Age of Onset   Diabetes Mother        Died in a house fire   Lung cancer Father    Hypertension Sister    Hypertension Sister    Hypertension Brother    Diabetes Brother    Hypertension Brother    Hypertension Other    Diabetes Other    Rheum arthritis Other    Stroke Other    Social History   Tobacco Use   Smoking status: Former    Packs/day: 2.00    Years: 30.00    Pack years: 60.00    Types: Cigarettes    Quit date: 05/17/1989    Years since quitting: 31.7   Smokeless tobacco: Never  Substance Use Topics   Alcohol use: Not Currently    Marital Sttus: Widowed  ROS  Review of Systems  Constitutional: Negative for malaise/fatigue and weight gain.  Cardiovascular:  Positive for chest pain. Negative for claudication, leg swelling, near-syncope, orthopnea, palpitations, paroxysmal nocturnal dyspnea and syncope.  Respiratory:  Positive for shortness of breath (improved since yesterday).   Neurological:  Negative for dizziness.  All other systems reviewed and are negative. Objective   Vitals with BMI 02/22/2021 02/22/2021 02/21/2021  Height - - -  Weight - - -  BMI - - -  Systolic 893 810 175  Diastolic 87 98 102  Pulse 96 92 94     Blood pressure 113/87, pulse 96, temperature 97.8 F (36.6 C), temperature source Oral, resp. rate 17, height 6\' 6"  (1.981 m), weight 99.3 kg, SpO2 95 %.    Physical Exam Vitals and nursing note reviewed.  Constitutional:      General: He is not in acute distress. HENT:     Head: Normocephalic and atraumatic.     Right Ear: External ear normal.     Left Ear: External ear normal.     Nose: Nose normal.     Mouth/Throat:     Mouth: Mucous membranes are moist.  Eyes:     Conjunctiva/sclera: Conjunctivae normal.  Neck:     Vascular: No carotid bruit.  Cardiovascular:     Rate and Rhythm: Normal rate. Rhythm irregularly irregular.     Pulses: Intact distal pulses.     Heart sounds: S1 normal and S2 normal. No murmur heard.   No gallop.     Comments: Variable S1-S2 Pulmonary:     Effort: Pulmonary effort is normal. No respiratory distress.     Breath sounds: No wheezing, rhonchi or rales.  Abdominal:     General: Bowel sounds are normal. There is no distension.     Palpations: Abdomen is soft.  Musculoskeletal:     Right lower leg: No edema.     Left lower leg: No edema.  Skin:    General: Skin is warm and dry.  Neurological:     General: No focal deficit present.     Mental Status: He is alert.  Psychiatric:        Mood and Affect: Mood normal.        Behavior: Behavior normal.   Laboratory examination:   Recent Labs    02/18/21 1534 02/21/21 1347 02/22/21 0131  NA 139 134* 135  K 4.6 4.6 4.0  CL 101 103 103  CO2 23 23 23   GLUCOSE 149* 176* 133*  BUN 14 16 14   CREATININE 0.98 0.93 1.19  CALCIUM 9.9 9.7 9.9  GFRNONAA  --  >60 >60   estimated creatinine clearance is 69.3 mL/min (by C-G formula based on SCr of 1.19 mg/dL).  CMP Latest Ref Rng & Units 02/22/2021 02/21/2021 02/18/2021  Glucose 70 - 99 mg/dL 133(H) 176(H) 149(H)  BUN 8 - 23 mg/dL 14 16 14   Creatinine 0.61 - 1.24 mg/dL 1.19 0.93 0.98  Sodium 135 - 145 mmol/L 135 134(L) 139  Potassium 3.5 - 5.1  mmol/L 4.0 4.6 4.6  Chloride 98 - 111 mmol/L 103 103 101  CO2 22 - 32 mmol/L 23 23 23   Calcium 8.9 - 10.3 mg/dL 9.9 9.7 9.9   CBC Latest Ref Rng & Units 02/22/2021 02/21/2021 02/18/2021  WBC 4.0 - 10.5 K/uL 7.7 7.0 8.1  Hemoglobin 13.0 - 17.0 g/dL 15.9 16.5 16.0  Hematocrit 39.0 - 52.0 % 47.0 49.6 46.6  Platelets 150 - 400 K/uL 299 312 293   Lipid Panel Recent Labs  02/22/21 0131  CHOL 99  TRIG 56  LDLCALC 44  VLDL 11  HDL 44  CHOLHDL 2.3    HEMOGLOBIN A1C Lab Results  Component Value Date   HGBA1C 8.1 (H) 02/22/2021   MPG 185.77 02/22/2021   TSH Recent Labs    02/21/21 1347 02/22/21 0131  TSH 1.615 2.348   BNP (last 3 results) Recent Labs    01/26/21 1443 02/21/21 1347  BNP 542.0* 487.0*   Results for orders placed or performed during the hospital encounter of 02/21/21 (from the past 48 hour(s))  CBG monitoring, ED     Status: Abnormal   Collection Time: 02/21/21  1:45 PM  Result Value Ref Range   Glucose-Capillary 173 (H) 70 - 99 mg/dL    Comment: Glucose reference range applies only to samples taken after fasting for at least 8 hours.  Basic metabolic panel     Status: Abnormal   Collection Time: 02/21/21  1:47 PM  Result Value Ref Range   Sodium 134 (L) 135 - 145 mmol/L   Potassium 4.6 3.5 - 5.1 mmol/L   Chloride 103 98 - 111 mmol/L   CO2 23 22 - 32 mmol/L   Glucose, Bld 176 (H) 70 - 99 mg/dL   BUN 16 8 - 23 mg/dL   Creatinine, Ser 0.93 0.61 - 1.24 mg/dL   Calcium 9.7 8.9 - 10.3 mg/dL   GFR, Estimated >60 >60 mL/min    Comment: (NOTE) Calculated using the CKD-EPI Creatinine Equation (2021)    Anion gap 8 5 - 15    Comment: Performed at [facility], [address] [place], [place] [address]  CBC     Status: None   Collection Time: 02/21/21  1:47 PM  Result Value Ref Range   WBC 7.0 4.0 - 10.5 K/uL   RBC 5.32 4.22 - 5.81 MIL/uL   Hemoglobin 16.5 13.0 - 17.0 g/dL   HCT 49.6 39.0 - 52.0 %   MCV 93.2 80.0 - 100.0 fL   MCH 31.0 26.0 - 34.0 pg    MCHC 33.3 30.0 - 36.0 g/dL   RDW 15.1 11.5 - 15.5 %   Platelets 312 150 - 400 K/uL   nRBC 0.0 0.0 - 0.2 %    Comment: Performed at [facility], [address], [place], [place] [address]  Troponin I (High Sensitivity)     Status: None   Collection Time: 02/21/21  1:47 PM  Result Value Ref Range   Troponin I (High Sensitivity) 11 <18 ng/L    Comment: (NOTE) Elevated high sensitivity troponin I (hsTnI) values and significant  changes across serial measurements may suggest ACS but many other  chronic and acute conditions are known to elevate hsTnI results.  Refer to the Links section for chest pain algorithms and additional  guidance. Performed at [facility], [address], [place], [place] [address]   Brain natriuretic peptide     Status: Abnormal   Collection Time: 02/21/21  1:47 PM  Result Value Ref Range   B Natriuretic Peptide 487.0 (H) 0.0 - 100.0 pg/mL    Comment: Performed at [facility], [address] [place], [place] [address]  TSH     Status: None   Collection Time: 02/21/21  1:47 PM  Result Value Ref Range   TSH 1.615 0.350 - 4.500 uIU/mL    Comment: Performed by a 3rd Generation assay with a functional sensitivity of <=0.01 uIU/mL. Performed at [facility], [address] [place],  [address]  Magnesium     Status: None   Collection Time: 02/21/21  1:47 PM  Result Value Ref Range   Magnesium 1.9 1.7 - 2.4 mg/dL    Comment: Performed at [facility], [address], [place], [place] [address]  Troponin I (High Sensitivity)     Status: None   Collection Time: 02/21/21  3:45 PM  Result Value Ref Range   Troponin I (High Sensitivity) 11 <18 ng/L    Comment: (NOTE) Elevated high sensitivity troponin I (hsTnI) values and significant  changes across serial measurements may suggest ACS but many other  chronic and acute conditions are known to elevate hsTnI results.  Refer to the "Links" section for chest pain algorithms and additional  guidance. Performed at  [facility], [address], [place], [place] [address]   Resp Panel by RT-PCR (Flu A&B, Covid) Nasopharyngeal Swab     Status: None   Collection Time: 02/21/21  4:02 PM   Specimen: Nasopharyngeal Swab; Nasopharyngeal(NP) swabs in vial transport medium  Result Value Ref Range   SARS Coronavirus 2 by RT PCR NEGATIVE NEGATIVE    Comment: (NOTE) SARS-CoV-2 target nucleic acids are NOT DETECTED.  The SARS-CoV-2 RNA is generally detectable in upper respiratory specimens during the acute phase of infection. The lowest concentration of SARS-CoV-2 viral copies this assay can detect is 138 copies/mL. A negative result does not preclude SARS-Cov-2 infection and should not be used as the sole basis for treatment or other patient management decisions. A negative result may occur with  improper specimen collection/handling, submission of specimen other than nasopharyngeal swab, presence of viral mutation(s) within the areas targeted by this assay, and inadequate number of viral copies(<138 copies/mL). A negative result must be combined with clinical observations, patient history, and epidemiological information. The expected result is Negative.  Fact Sheet for Patients:  EntrepreneurPulse.com.au  Fact Sheet for Healthcare Providers:  IncredibleEmployment.be  This test is no t yet approved or cleared by the [place] FDA and  has been authorized for detection and/or diagnosis of SARS-CoV-2 by FDA under an Emergency Use Authorization (EUA). This EUA will remain  in effect (meaning this test can be used) for the duration of the COVID-19 declaration under Section 564(b)(1) of the Act, 21 U.S.C.section 360bbb-3(b)(1), unless the authorization is terminated  or revoked sooner.       Influenza A by PCR NEGATIVE NEGATIVE   Influenza B by PCR NEGATIVE NEGATIVE    Comment: (NOTE) The Xpert Xpress SARS-CoV-2/FLU/RSV plus assay is intended as an aid in the  diagnosis of influenza from Nasopharyngeal swab specimens and should not be used as a sole basis for treatment. Nasal washings and aspirates are unacceptable for Xpert Xpress SARS-CoV-2/FLU/RSV testing.  Fact Sheet for Patients: EntrepreneurPulse.com.au  Fact Sheet for Healthcare Providers: IncredibleEmployment.be  This test is not yet approved or cleared by the [place] FDA and has been authorized for detection and/or diagnosis of SARS-CoV-2 by FDA under an Emergency Use Authorization (EUA). This EUA will remain in effect (meaning this test can be used) for the duration of the COVID-19 declaration under Section 564(b)(1) of the Act, 21 U.S.C. section 360bbb-3(b)(1), unless the authorization is terminated or revoked.  Performed at [facility], [address] [place], [place] [address]   Glucose, capillary     Status: Abnormal   Collection Time: 02/21/21 11:54 PM  Result Value Ref Range   Glucose-Capillary 128 (H) 70 - 99 mg/dL    Comment: Glucose reference range applies only to samples  taken after fasting for at least 8 hours.  Lipid panel     Status: None   Collection Time: 02/22/21  1:31 AM  Result Value Ref Range   Cholesterol 99 0 - 200 mg/dL   Triglycerides 56 <150 mg/dL   HDL 44 >40 mg/dL   Total CHOL/HDL Ratio 2.3 RATIO   VLDL 11 0 - 40 mg/dL   LDL Cholesterol 44 0 - 99 mg/dL    Comment:        Total Cholesterol/HDL:CHD Risk Coronary Heart Disease Risk Table                     Men   Women  1/2 Average Risk   3.4   3.3  Average Risk       5.0   4.4  2 X Average Risk   9.6   7.1  3 X Average Risk  23.4   11.0        Use the calculated Patient Ratio above and the CHD Risk Table to determine the patient's CHD Risk.        ATP III CLASSIFICATION (LDL):  <100     mg/dL   Optimal  100-129  mg/dL   Near or Above                    Optimal  130-159  mg/dL   Borderline  160-189  mg/dL   High  >190     mg/dL   Very  High Performed at [facility] [address], [place], [place] [address]   Basic metabolic panel     Status: Abnormal   Collection Time: 02/22/21  1:31 AM  Result Value Ref Range   Sodium 135 135 - 145 mmol/L   Potassium 4.0 3.5 - 5.1 mmol/L   Chloride 103 98 - 111 mmol/L   CO2 23 22 - 32 mmol/L   Glucose, Bld 133 (H) 70 - 99 mg/dL    Comment: Glucose reference range applies only to samples taken after fasting for at least 8 hours.   BUN 14 8 - 23 mg/dL   Creatinine, Ser 1.19 0.61 - 1.24 mg/dL   Calcium 9.9 8.9 - 10.3 mg/dL   GFR, Estimated >60 >60 mL/min    Comment: (NOTE) Calculated using the CKD-EPI Creatinine Equation (2021)    Anion gap 9 5 - 15    Comment: Performed at [facility] [address], [place], [place] [address]  CBC     Status: None   Collection Time: 02/22/21  1:31 AM  Result Value Ref Range   WBC 7.7 4.0 - 10.5 K/uL   RBC 5.21 4.22 - 5.81 MIL/uL   Hemoglobin 15.9 13.0 - 17.0 g/dL   HCT 47.0 39.0 - 52.0 %   MCV 90.2 80.0 - 100.0 fL   MCH 30.5 26.0 - 34.0 pg   MCHC 33.8 30.0 - 36.0 g/dL   RDW 15.0 11.5 - 15.5 %   Platelets 299 150 - 400 K/uL   nRBC 0.0 0.0 - 0.2 %    Comment: Performed at [facility], [place] [address], [place], [place] [address]  Hemoglobin A1c     Status: Abnormal   Collection Time: 02/22/21  1:31 AM  Result Value Ref Range   Hgb A1c MFr Bld 8.1 (H) 4.8 - 5.6 %    Comment: (NOTE) Pre diabetes:          5.7%-6.4%  Diabetes:              >  6.4%  Glycemic control for   <7.0% adults with diabetes    Mean Plasma Glucose 185.77 mg/dL    Comment: Performed at [facility] [address] [place], [place] [address]  TSH     Status: None   Collection Time: 02/22/21  1:31 AM  Result Value Ref Range   TSH 2.348 0.350 - 4.500 uIU/mL    Comment: Performed by a 3rd Generation assay with a functional sensitivity of <=0.01 uIU/mL. Performed at [facility], [place] [address], [place], [place] [address]    Glucose, capillary     Status: Abnormal   Collection Time: 02/22/21  8:05 AM  Result Value Ref Range   Glucose-Capillary 163 (H) 70 - 99 mg/dL    Comment: Glucose reference range applies only to samples taken after fasting for at least 8 hours.  Heparin level (unfractionated)     Status: Abnormal   Collection Time: 02/22/21  8:47 AM  Result Value Ref Range   Heparin Unfractionated 0.76 (H) 0.30 - 0.70 IU/mL    Comment: (NOTE) The clinical reportable range upper limit is being lowered to >1.10 to align with the FDA approved guidance for the current laboratory assay.  If heparin results are below expected values, and patient dosage has  been confirmed, suggest follow up testing of antithrombin III levels. Performed at [facility], [place] [address], [place], [place] [address]   APTT     Status: Abnormal   Collection Time: 02/22/21  8:47 AM  Result Value Ref Range   aPTT 57 (H) 24 - 36 seconds    Comment:        IF BASELINE aPTT IS ELEVATED, SUGGEST PATIENT RISK ASSESSMENT BE USED TO DETERMINE APPROPRIATE ANTICOAGULANT THERAPY. Performed at [facility], [place] [address] [place], [place] [address]   Glucose, capillary     Status: Abnormal   Collection Time: 02/22/21 11:13 AM  Result Value Ref Range   Glucose-Capillary 168 (H) 70 - 99 mg/dL    Comment: Glucose reference range applies only to samples taken after fasting for at least 8 hours.    Medications and allergies  No Known Allergies   Current Meds  Medication Sig   acetaminophen (TYLENOL) 500 MG tablet Take 1,000 mg by mouth every 6 (six) hours as needed for moderate pain or mild pain.   apixaban (ELIQUIS) 5 MG TABS tablet Take 1 tablet (5 mg total) by mouth 2 (two) times daily.   aspirin EC 81 MG tablet Take 81 mg by mouth daily.   dapagliflozin propanediol (FARXIGA) 10 MG TABS tablet Take 1 tablet (10 mg total) by mouth daily before breakfast.   glipiZIDE (GLUCOTROL XL) 10 MG 24 hr tablet Take 10 mg by  mouth 2 (two) times daily.   Insulin Degludec (TRESIBA FLEXTOUCH [place]) Inject 48 Units into the skin daily.   Magnesium Oxide 400 MG CAPS Take 1 capsule (400 mg total) by mouth in the morning and at bedtime. (Patient taking differently: Take 400 mg by mouth 3 (three) times daily.)   metFORMIN (GLUCOPHAGE) 1000 MG tablet Take 1,000 mg by mouth 2 (two) times daily with a meal.   metoprolol tartrate (LOPRESSOR) 25 MG tablet Take 1 tablet (25 mg total) by mouth 2 (two) times daily.   nitroGLYCERIN (NITROSTAT) 0.4 MG SL tablet Place 0.4 mg under the tongue every 5 (five) minutes as needed for chest pain.   pantoprazole (PROTONIX) 40 MG tablet Take 40 mg by mouth  daily.   rosuvastatin (CRESTOR) 5 MG tablet Take 5 mg by mouth at bedtime.   sacubitril-valsartan (ENTRESTO) 49-51 MG Take 1 tablet by mouth 2 (two) times daily.    Scheduled Meds:  aspirin EC  81 mg Oral Daily   dapagliflozin propanediol  10 mg Oral QAC breakfast   famotidine  40 mg Oral Daily   feeding supplement  237 mL Oral BID BM   [START ON 02/24/2021] influenza vaccine adjuvanted  0.5 mL Intramuscular Tomorrow-1000   insulin aspart  0-5 Units Subcutaneous QHS   insulin aspart  0-9 Units Subcutaneous TID WC   isosorbide mononitrate  20 mg Oral Daily   magnesium oxide  400 mg Oral TID   metoprolol tartrate  25 mg Oral BID   pantoprazole  40 mg Oral Daily   rosuvastatin  5 mg Oral QHS   sacubitril-valsartan  1 tablet Oral BID   Continuous Infusions:  amiodarone 30 mg/hr (02/22/21 1100)   heparin 1,600 Units/hr (02/22/21 1100)   nitroGLYCERIN     PRN Meds:.acetaminophen, alum & mag hydroxide-simeth, nitroGLYCERIN, ondansetron (ZOFRAN) IV   I/O last 3 completed shifts: In: 259.6 [I.V.:259.6] Out: 500 [Urine:500] Total I/O In: 890.4 [P.O.:477; I.V.:413.4] Out: 400 [Urine:400]    Radiology:   DG Chest 2 View  Result Date: 02/21/2021 CLINICAL DATA:  Chest pain and heaviness, nonradiating. Shortness of breath. Symptoms for  1 month. History coronary artery disease, former smoker, hypertension, melanoma, type II diabetes mellitus EXAM: CHEST - 2 VIEW COMPARISON:  03/14/2021 FINDINGS: Enlargement of cardiac silhouette. Mediastinal contours and pulmonary vascularity normal. Lungs clear. No infiltrate, pleural effusion, or pneumothorax. Osseous structures unremarkable. IMPRESSION: Enlargement of cardiac silhouette. No acute abnormalities. Electronically Signed   By: Lavonia Dana M.D.   On: 02/21/2021 14:25    Cardiac Studies:  EKG: 01/26/2021: Atrial fibrillation with rapid ventricular rate 126 bpm, left axis deviation, left anterior fascicular block, left bundle branch block, consider old inferior infarct. 02/11/2021: Atrial fibrillation, 105 bpm, left axis, left bundle branch block.  02/22/2021: Atrial fibrillation with controlled ventricular response at a rate of 85 bpm.  Left axis.  Left bundle branch block.     Echocardiogram: 02/05/2021:  Severely depressed LV systolic function with visual EF 25-30%. Hypokinetic global wall motion with regionality involving septal wall. Left ventricle cavity is mildly dilated. Moderate left ventricular hypertrophy. Unable to evaluate diastolic function due to atrial fibrillation. Elevated LAP.  Moderate (Grade II) mitral regurgitation.  Moderate tricuspid regurgitation. Mild pulmonary hypertension, RVSP 74mmHg.  IVC is dilated with a respiratory response of <50%.  No prior study for comparison.   Stress Testing: MPI 02/12/2016: No diagnostic ST segment changes to indicate ischemia. No significant arrhythmias. Small, mild intensity, partially reversible mid to basal inferior defect suggestive of variable soft tissue attenuation. No large ischemic zones are noted. This is a low risk study. Nuclear stress EF: 55%.   Heart Catheterization: 2009: Mild CAD (see progress note 02/10/2016)  Assessment   Derek Bond  is a 75 y.o. Caucasian male with history of cardiomyopathy, chronic  HFrEF, hypertension, diabetes mellitus, hyperlipidemia, former smoker, sleep apnea (not on CPAP), persistent atrial fibrillation, and erectile dysfunction.   1.  Unstable angina 2.  Acute on chronic heart failure with reduced ejection fraction 3.  Persistent atrial fibrillation 4.  Essential hypertension 5.  Diabetes mellitus 6.  Hyperlipidemia 7.  Sleep apnea 8.  Left bundle branch block   Recommendations:   1.  Unstable angina Troponins negative and flat and  EKG without acute ischemic changes compared to previous. No active chest pain at the time of exam this morning.   However did have an episode of chest pain earlier this morning, required 2 nitro tabs.  Will add IV nitroglycerin drip to be titrated for chest pain Continue IV heparin Continue Imdur Continue aspirin  The left heart catheterization procedure was explained to the patient in detail. The indication, alternatives, risks and benefits were reviewed. Complications including but not limited to bleeding, infection, acute kidney injury, blood transfusion, heart rhythm disturbances, contrast (dye) reaction, damage to the arteries or nerves in the legs or hands, cerebrovascular accident, myocardial infarction, need for emergent bypass surgery, blood clots in the legs, possible need for emergent blood transfusion, and rarely death were reviewed and discussed with the patient. The patient voices understanding and wishes to proceed.  Although patient is scheduled for elective cardiac catheterization on 02/24/2021, would prefer to have this done as soon as able.  We will tentatively plan for cardiac catheterization tomorrow. N.p.o. after midnight  2.  Acute on chronic heart failure with reduced ejection fraction Echocardiogram 02/05/2021: LVEF 25-30%, global hypokinesis BNP mildly elevated Continue Entresto,  Farxiga, Metoprolol, Imdur.  Patient is presently euvolemic on exam.  We will continue to monitor.  Heart cath tomorrow.    3.  Persistent atrial fibrillation Rate control: Lopressor Rhythm control: IV amiodarone Thromboembolic prophylaxis: Patient is on Eliquis outpatient, however presently on heparin infusion since admission.  Holding Eliquis This patients CHA2DS2-VASc Score is 5 which correlates to 7.2 risk of stroke per year (age, CHF, hypertension, diabetes). Patient is presently relatively well rate controlled at rest, with heart rate elevated up to 120s beats per minute with minimal exertion. Will proceed with cardiac catheterization for further ischemic evaluation.  However if patient is without obstructive CAD could consider TEE cardioversion. Continue telemetry  4.  Essential hypertension Presently well controlled. Continue Imdur, Lopressor, Entresto We will also start nitroglycerin infusion for chest pain control. Continue to monitor blood pressure closely particularly with initiation of nitroglycerin and risk of hypotension.  5.  Diabetes mellitus Continue glycemic control protocol with sliding scale insulin.  6.  Hyperlipidemia Continue statin therapy  7.  Sleep apnea Presently not CPAP Recommend further outpatient management and follow-up with sleep provider   8.  Left bundle branch block Known history Continue to monitor   Patient was seen in collaboration with Dr. Terri Skains. He also reviewed patient's chart and examined the patient. Dr. Terri Skains is in agreement of the plan.    Alethia Berthold, PA-C 02/22/2021, 1:40 PM Office: [phone]  ADDENDUM: Patient seen and examined independently. Note reviewed with the Lawerance Cruel, PA and relevant changes made  I personally reviewed laboratory data, imaging studies and relevant notes.  I independently examined the patient and formulated the important aspects of the plan.  I have personally discussed the plan with the patient  Comments or changes to the note/plan are indicated below.  My Exam:  Vitals with BMI 02/22/2021 02/22/2021  02/22/2021  Height - - -  Weight - - -  BMI - - -  Systolic [phone]  Diastolic 87 87 98  Pulse 85 96 92    CONSTITUTIONAL: Well-developed and well-nourished. No acute distress.  SKIN: Skin is warm and dry. No rash noted. No cyanosis. No pallor. No jaundice HEAD: Normocephalic and atraumatic.  EYES: No scleral icterus MOUTH/THROAT: Moist oral membranes.  NECK: No JVD present. No thyromegaly noted. No carotid bruits  LYMPHATIC: No  visible cervical adenopathy.  CHEST Normal respiratory effort. No intercostal retractions  LUNGS: Clear to auscultation bilaterally no stridor. No wheezes. No rales.  CARDIOVASCULAR: Irregularly irregular, variable S1-S2, no murmurs rubs or gallops appreciated. ABDOMINAL: No apparent ascites.  EXTREMITIES: No peripheral edema  HEMATOLOGIC: No significant bruising NEUROLOGIC: Oriented to person, place, and time. Nonfocal. Normal muscle tone.  PSYCHIATRIC: Normal mood and affect. Normal behavior. Cooperative   Assessment & Plan:  Unstable angina: This is his second ER visit for chest pain since last office encounter. Even though the troponins are essentially flat, EKG does not illustrate injury pattern the decision was to transfer him from [facility] to [facility], ED given his unstable angina, severely reduced LVEF, and atrial fibrillation. Tentatively was scheduled for heart catheterization in 2 days on Tuesday, 02/24/2021 as an outpatient.  This will be performed tomorrow and patient is agreeable with the plan of care. Risks, benefits, and alternatives of left heart catheterization with possible intervention discussed with the patient.  He voices understanding and would like to proceed forward. Currently on IV heparin drip Will order the nitro drip to be on at bedside if needed for chest pain titration Sublingual nitroglycerin tablets for as needed basis.  Atrial fibrillation Persistent. Rate control: Metoprolol.   Rhythm control:  Amiodarone Thromboembolic prophylaxis: Heparin If patient does not have any obstructive CAD plan to proceed with cardioversion during his hospitalization.  Acute on chronic heart failure with reduced EF: As outpatient and I have uptitrated Entresto, Lopressor, Imdur, and [place]. As hemodynamics allow would like to [employer]. Transition from Lopressor to Toprol-XL prior to discharge Echocardiogram recently performed.  This note was created using a voice recognition software as a result there may be grammatical errors inadvertently enclosed that do not reflect the nature of this encounter. Every attempt is made to correct such errors.  CRITICAL CARE Performed by: Rex Kras   Total critical care time: 38 minutes   Critical care time was exclusive of separately billable procedures and treating other patients.   Critical care was necessary to treat or prevent imminent or life-threatening deterioration.   Critical care was time spent personally by me on the following activities: development of treatment plan with patient  as well as nursing, discussed plan of care with regards to unstable angina, persistent atrial fibrillation, HFrEF, evaluation of patient's response to treatment, examination of patient, obtaining history from patient, ordering and performing treatments and interventions, ordering and review of laboratory studies, ordering and review of radiographic studies, pulse oximetry and re-evaluation of patient's condition.  Signed, Mechele Claude [facility]  Pager: [phone] Office: [phone] 02/22/2021 4:35 PM

## 2021-02-23 ENCOUNTER — Encounter (HOSPITAL_COMMUNITY)
Admission: EM | Disposition: A | Discharge status: Home Health | Payer: Self-pay | Source: Home / Self Care | Attending: Thoracic Surgery (Cardiothoracic Vascular Surgery)

## 2021-02-23 ENCOUNTER — Encounter (HOSPITAL_COMMUNITY): Payer: Self-pay | Admitting: Cardiology

## 2021-02-23 DIAGNOSIS — I340 Nonrheumatic mitral (valve) insufficiency: Secondary | ICD-10-CM | POA: Diagnosis not present

## 2021-02-23 DIAGNOSIS — I5021 Acute systolic (congestive) heart failure: Secondary | ICD-10-CM

## 2021-02-23 DIAGNOSIS — I2510 Atherosclerotic heart disease of native coronary artery without angina pectoris: Secondary | ICD-10-CM

## 2021-02-23 DIAGNOSIS — Z01810 Encounter for preprocedural cardiovascular examination: Secondary | ICD-10-CM

## 2021-02-23 DIAGNOSIS — I4819 Other persistent atrial fibrillation: Secondary | ICD-10-CM

## 2021-02-23 DIAGNOSIS — I5020 Unspecified systolic (congestive) heart failure: Secondary | ICD-10-CM

## 2021-02-23 DIAGNOSIS — I4821 Permanent atrial fibrillation: Secondary | ICD-10-CM | POA: Diagnosis not present

## 2021-02-23 HISTORY — PX: RIGHT/LEFT HEART CATH AND CORONARY ANGIOGRAPHY: CATH118266

## 2021-02-23 LAB — POCT I-STAT EG7
Acid-base deficit: 1 mmol/L (ref 0.0–2.0)
Bicarbonate: 25.7 mmol/L (ref 20.0–28.0)
Calcium, Ion: 1.34 mmol/L (ref 1.15–1.40)
HCT: 50 % (ref 39.0–52.0)
Hemoglobin: 17 g/dL (ref 13.0–17.0)
O2 Saturation: 75 %
Potassium: 4 mmol/L (ref 3.5–5.1)
Sodium: 139 mmol/L (ref 135–145)
TCO2: 27 mmol/L (ref 22–32)
pCO2, Ven: 47.8 mmHg (ref 44.0–60.0)
pH, Ven: 7.339 (ref 7.250–7.430)
pO2, Ven: 43 mmHg (ref 32.0–45.0)

## 2021-02-23 LAB — BASIC METABOLIC PANEL
Anion gap: 9 (ref 5–15)
BUN: 14 mg/dL (ref 8–23)
CO2: 23 mmol/L (ref 22–32)
Calcium: 9.7 mg/dL (ref 8.9–10.3)
Chloride: 104 mmol/L (ref 98–111)
Creatinine, Ser: 0.76 mg/dL (ref 0.61–1.24)
GFR, Estimated: >60 mL/min (ref >60)
Glucose, Bld: 127 mg/dL — ABNORMAL HIGH (ref 70–99)
Potassium: 3.9 mmol/L (ref 3.5–5.1)
Sodium: 136 mmol/L (ref 135–145)

## 2021-02-23 LAB — POCT I-STAT 7, (LYTES, BLD GAS, ICA,H+H)
Acid-base deficit: 1 mmol/L (ref 0.0–2.0)
Bicarbonate: 24.6 mmol/L (ref 20.0–28.0)
Calcium, Ion: 1.29 mmol/L (ref 1.15–1.40)
HCT: 49 % (ref 39.0–52.0)
Hemoglobin: 16.7 g/dL (ref 13.0–17.0)
O2 Saturation: 99 %
Potassium: 3.9 mmol/L (ref 3.5–5.1)
Sodium: 139 mmol/L (ref 135–145)
TCO2: 26 mmol/L (ref 22–32)
pCO2 arterial: 42.4 mmHg (ref 32.0–48.0)
pH, Arterial: 7.372 (ref 7.350–7.450)
pO2, Arterial: 144 mmHg — ABNORMAL HIGH (ref 83.0–108.0)

## 2021-02-23 LAB — CBC
HCT: 48.4 % (ref 39.0–52.0)
Hemoglobin: 16.4 g/dL (ref 13.0–17.0)
MCH: 30.4 pg (ref 26.0–34.0)
MCHC: 33.9 g/dL (ref 30.0–36.0)
MCV: 89.8 fL (ref 80.0–100.0)
Platelets: 257 10*3/uL (ref 150–400)
RBC: 5.39 MIL/uL (ref 4.22–5.81)
RDW: 15.1 % (ref 11.5–15.5)
WBC: 7 10*3/uL (ref 4.0–10.5)
nRBC: 0.3 % — ABNORMAL HIGH (ref 0.0–0.2)

## 2021-02-23 LAB — POCT ACTIVATED CLOTTING TIME: Activated Clotting Time: 132 seconds

## 2021-02-23 LAB — GLUCOSE, CAPILLARY
Glucose-Capillary: 106 mg/dL — ABNORMAL HIGH (ref 70–99)
Glucose-Capillary: 224 mg/dL — ABNORMAL HIGH (ref 70–99)
Glucose-Capillary: 245 mg/dL — ABNORMAL HIGH (ref 70–99)
Glucose-Capillary: 212 mg/dL — ABNORMAL HIGH (ref 70–99)

## 2021-02-23 LAB — HEPARIN LEVEL (UNFRACTIONATED): Heparin Unfractionated: 0.75 IU/mL — ABNORMAL HIGH (ref 0.30–0.70)

## 2021-02-23 SURGERY — RIGHT/LEFT HEART CATH AND CORONARY ANGIOGRAPHY
Anesthesia: LOCAL

## 2021-02-23 MED ORDER — SODIUM CHLORIDE 0.9 % IV SOLN: 250.0000 mL | INTRAVENOUS | Status: DC | PRN

## 2021-02-23 MED ORDER — HEPARIN (PORCINE) IN NACL 1000-0.9 UT/500ML-% IV SOLN
INTRAVENOUS | Status: DC | PRN
  Administered 2021-02-23 (×2): 500 mL

## 2021-02-23 MED ORDER — IOHEXOL 350 MG/ML SOLN
INTRAVENOUS | Status: DC | PRN
  Administered 2021-02-23: 45 mL

## 2021-02-23 MED ORDER — VERAPAMIL HCL 2.5 MG/ML IV SOLN
INTRAVENOUS | Status: DC | PRN
  Administered 2021-02-23: 10 mL via INTRA_ARTERIAL

## 2021-02-23 MED ORDER — SODIUM CHLORIDE 0.9 % IV SOLN: INTRAVENOUS | Status: AC

## 2021-02-23 MED ORDER — MIDAZOLAM HCL 2 MG/2ML IJ SOLN
INTRAMUSCULAR | Status: DC | PRN
  Administered 2021-02-23: 1 mg via INTRAVENOUS

## 2021-02-23 MED ORDER — SODIUM CHLORIDE 0.9% FLUSH
3.0000 mL | Freq: Two times a day (BID) | INTRAVENOUS | Status: DC
  Administered 2021-02-23: 3 mL via INTRAVENOUS

## 2021-02-23 MED ORDER — LIDOCAINE HCL (PF) 1 % IJ SOLN
INTRAMUSCULAR | Status: DC | PRN
  Administered 2021-02-23: 15 mL
  Administered 2021-02-23 (×2): 3 mL

## 2021-02-23 MED ORDER — HEPARIN (PORCINE) 25000 UT/250ML-% IV SOLN
1450.0000 [IU]/h | INTRAVENOUS | Status: DC
  Administered 2021-02-23: 1500 [IU]/h via INTRAVENOUS
  Administered 2021-02-24 (×2): 1600 [IU]/h via INTRAVENOUS
  Administered 2021-02-25: 1450 [IU]/h via INTRAVENOUS
  Filled 2021-02-23 (×4): qty 250

## 2021-02-23 MED ORDER — LABETALOL HCL 5 MG/ML IV SOLN: 10.0000 mg | INTRAVENOUS | Status: AC | PRN

## 2021-02-23 MED ORDER — FENTANYL CITRATE (PF) 100 MCG/2ML IJ SOLN
INTRAMUSCULAR | Status: DC | PRN
  Administered 2021-02-23: 25 ug via INTRAVENOUS

## 2021-02-23 MED ORDER — ONDANSETRON HCL 4 MG/2ML IJ SOLN: 4.0000 mg | Freq: Four times a day (QID) | INTRAMUSCULAR | Status: DC | PRN

## 2021-02-23 MED ORDER — SODIUM CHLORIDE 0.9% FLUSH: 3.0000 mL | INTRAVENOUS | Status: DC | PRN

## 2021-02-23 MED ORDER — HYDRALAZINE HCL 20 MG/ML IJ SOLN: 10.0000 mg | INTRAMUSCULAR | Status: AC | PRN

## 2021-02-23 MED ORDER — ACETAMINOPHEN 325 MG PO TABS: 650.0000 mg | ORAL_TABLET | ORAL | Status: DC | PRN

## 2021-02-23 SURGICAL SUPPLY — 19 items
CATH BALLN WEDGE 5F 110CM (CATHETERS) ×1 IMPLANT
CATH INFINITI 5FR JL5 (CATHETERS) ×1 IMPLANT
CATH INFINITI 5FR MULTPACK ANG (CATHETERS) ×1 IMPLANT
CATH OPTITORQUE TIG 4.0 5F (CATHETERS) ×1 IMPLANT
DEVICE RAD COMP TR BAND LRG (VASCULAR PRODUCTS) ×1 IMPLANT
GLIDESHEATH SLEND A-KIT 6F 22G (SHEATH) ×1 IMPLANT
GUIDEWIRE .025 260CM (WIRE) ×1 IMPLANT
GUIDEWIRE INQWIRE 1.5J.035X260 (WIRE) IMPLANT
INQWIRE 1.5J .035X260CM (WIRE) ×2
KIT HEART LEFT (KITS) ×2 IMPLANT
PACK CARDIAC CATHETERIZATION (CUSTOM PROCEDURE TRAY) ×2 IMPLANT
SHEATH GLIDE SLENDER 4/5FR (SHEATH) ×1 IMPLANT
SHEATH PINNACLE 5F 10CM (SHEATH) ×1 IMPLANT
SHEATH PROBE COVER 6X72 (BAG) ×1 IMPLANT
TRANSDUCER W/STOPCOCK (MISCELLANEOUS) ×2 IMPLANT
TUBING CIL FLEX 10 FLL-RA (TUBING) ×2 IMPLANT
WIRE EMERALD 3MM-J .035X150CM (WIRE) ×1 IMPLANT
WIRE HI TORQ VERSACORE J 260CM (WIRE) ×1 IMPLANT
WIRE MICRO SET SILHO 5FR 7 (SHEATH) ×1 IMPLANT

## 2021-02-23 NOTE — Progress Notes (Signed)
Site area: rt groin fa sheath Site Prior to Removal:  Level 0 Pressure Applied For: 20 minutes Manual:   yes Patient Status During Pull:  stable Post Pull Site:  Level 0 Post Pull Instructions Given:  yes Post Pull Pulses Present: rt pt palpable Dressing Applied:  gauze and tegaderm Bedrest begins @ 0920 Comments:

## 2021-02-23 NOTE — Interval H&P Note (Signed)
History and Physical Interval Note:  02/23/2021 7:51 AM  Derek Bond  has presented today for surgery, with the diagnosis of unstable angina.  The various methods of treatment have been discussed with the patient and family. After consideration of risks, benefits and other options for treatment, the patient has consented to  Procedure(s): RIGHT/LEFT HEART CATH AND CORONARY ANGIOGRAPHY (N/A) as a surgical intervention.  The patient's history has been reviewed, patient examined, no change in status, stable for surgery.  I have reviewed the patient's chart and labs.  Questions were answered to the patient's satisfaction.    2012 Appropriate Use Criteria for Diagnostic Catheterization Cardiomyopathies (Right and Left Heart Catheterization OR Right Heart Catheterization Alone With/Without Left Ventriculography and Coronary Angiography) Indication:  Known or suspected cardiomyopathy with or without heart failure A (7) Indication: 93; Score 7 291  Alexiah Koroma J Markel Kurtenbach

## 2021-02-23 NOTE — Consult Note (Addendum)
Advanced Heart Failure Team Consult Note   Primary Physician: Glenda Chroman, MD PCP-Cardiologist:  Dr.Tolia   Reason for Consultation: Pre-operative Evaluation/ Heart Failure Optimization prior to Potential CABG   HPI:    Derek Bond is seen today for pre-operative evaluation/ heart failure optimization, prior to potential CABG, at the request of Dr. Kipp Brood, [place] surgery.   75 y/o male, former smoker, w/ T2DM, HTN, OSA not on CPAP, w/ newly diagnosed systolic heart failure, in the setting of newly recognized atrial fibrillation and LBBB. Echo 9/22 showed reduced LVEF 30-35% w/ normal RV, Mod MR and Mod TR. He was subsequently referred by PCP to Dr. Terri Skains for further evaluation and management. He was placed on medical therapy and anticoagulation w/ Eliquis w/ plans to set up for outpatient LHC.  However pt presented to [facility] on 10/8 w/ CP concerning for unstable angina. HS trop negative x 2, 11>>11. Given recent history and CP, he was transferred to [facility] for [place].   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 CT surgery consulted for potential CABG + possible MVR (at least mod MR) and MAZE w/ LAA clipping.   CBC and BMP normal. SCr 0.76. K 3.9. Current meds include Delene Loll, Farxiga, Imdur, Lopressor, ASA and Crestor. Also on amio gtt. Heparin gtt on pause post cath.    Remains in Afib w/ CVR 80s. Currently CP free. No resting dyspnea.    Cardiac Studies  2D Echo 02/05/21 1. Severely depressed LV systolic function with visual EF 25-30%. Hypokinetic global wall motion with regionality involving septal wall. Left ventricle cavity is mildly dilated. Moderate left ventricular hypertrophy. Unable to evaluate diastolic function due to atrial fibrillation. Elevated LAP. 2. Moderate (Grade II) mitral regurgitation. 3. Moderate tricuspid regurgitation. Mild pulmonary hypertension, RVSP 49mmHg. 4. IVC  is dilated with a respiratory response of <50%. 5. No prior study for comparison. RV normal   R/LHC 02/23/21  LM: Normal LAD: Prox 80% stenosius at bifurcation with small to medium caliber D1 with 75% long prox disease. 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%, diabetic patient CVTS consult for CABG     Review of Systems: [y] = yes, [ ]  = no   General: Weight gain [ ] ; Weight loss [ ] ; Anorexia [ ] ; Fatigue [ ] ; Fever [ ] ; Chills [ ] ; Weakness [ ]   Cardiac: Chest pain/pressure [ Y]; Resting SOB [ ] ; Exertional SOB [Y ]; Orthopnea [ ] ; Pedal Edema [ ] ; Palpitations [ Y]; Syncope [ ] ; Presyncope [ ] ; Paroxysmal nocturnal dyspnea[ ]   Pulmonary: Cough [ ] ; Wheezing[ ] ; Hemoptysis[ ] ; Sputum [ ] ; Snoring [ ]   GI: Vomiting[ ] ; Dysphagia[ ] ; Melena[ ] ; Hematochezia [ ] ; Heartburn[ ] ; Abdominal pain [ ] ; Constipation [ ] ; Diarrhea [ ] ; BRBPR [ ]   GU: Hematuria[ ] ; Dysuria [ ] ; Nocturia[ ]   Vascular: Pain in legs with walking [ ] ; Pain in feet with lying flat [ ] ; Non-healing sores [ ] ; Stroke [ ] ; TIA [ ] ; Slurred speech [ ] ;  Neuro: Headaches[ ] ; Vertigo[ ] ; Seizures[ ] ; Paresthesias[ ] ;Blurred vision [ ] ; Diplopia [ ] ; Vision  changes [ ]   Ortho/Skin: Arthritis [ ] ; Joint pain [ ] ; Muscle pain [ ] ; Joint swelling [ ] ; Back Pain [ ] ; Rash [ ]   Psych: Depression[ ] ; Anxiety[ ]   Heme: Bleeding problems [ ] ; Clotting disorders [ ] ; Anemia [ ]   Endocrine: Diabetes [ ] ; Thyroid dysfunction[ ]   Home Medications Prior to Admission medications   Medication Sig Start Date End Date Taking? Authorizing Provider  acetaminophen (TYLENOL) 500 MG tablet Take 1,000 mg by mouth every 6 (six) hours as needed for moderate pain or mild pain.   Yes [provider]  apixaban (ELIQUIS) 5 MG TABS tablet Take 1 tablet (5 mg total) by mouth 2 (two) times daily. 02/02/21 05/03/21 Yes Tolia, Sunit, DO  aspirin EC 81 MG tablet Take 81 mg by mouth daily.   Yes [provider]  dapagliflozin propanediol (FARXIGA) 10 MG TABS tablet Take 1 tablet (10 mg total) by mouth daily before breakfast. 02/11/21 05/12/21 Yes Tolia, Sunit, DO  glipiZIDE (GLUCOTROL XL) 10 MG 24 hr tablet Take 10 mg by mouth 2 (two) times daily.   Yes [provider]  Insulin Degludec (TRESIBA FLEXTOUCH [place]) Inject 48 Units into the skin daily.   Yes [provider]  Magnesium Oxide 400 MG CAPS Take 1 capsule (400 mg total) by mouth in the morning and at bedtime. Patient taking differently: Take 400 mg by mouth 3 (three) times daily. 01/30/21 04/30/21 Yes Tolia, Sunit, DO  metFORMIN (GLUCOPHAGE) 1000 MG tablet Take 1,000 mg by mouth 2 (two) times daily with a meal.   Yes [provider]  metoprolol tartrate (LOPRESSOR) 25 MG tablet Take 1 tablet (25 mg total) by mouth 2 (two) times daily. 01/26/21 04/26/21 Yes Tolia, Sunit, DO  nitroGLYCERIN (NITROSTAT) 0.4 MG SL tablet Place 0.4 mg under the tongue every 5 (five) minutes as needed for chest pain.   Yes [provider]  pantoprazole (PROTONIX) 40 MG tablet Take 40 mg by mouth daily.   Yes [provider]  rosuvastatin (CRESTOR) 5 MG tablet Take 5 mg by mouth at bedtime.   Yes [provider]  sacubitril-valsartan (ENTRESTO) 49-51 MG Take 1 tablet by mouth 2 (two) times daily. 01/30/21 04/30/21 Yes Tolia, Sunit, DO  furosemide (LASIX) 20 MG tablet Take 1 tablet (20 mg total) by mouth daily. 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    Due to  laceration at age 61    Hiatal hernia    History of melanoma    Obstructive sleep apnea    Type 2 diabetes mellitus ([place])     Past Surgical History: Past Surgical History:  Procedure Laterality Date   BIOPSY  12/21/2019   Procedure: BIOPSY;  Surgeon: Harvel Quale, MD;  Location: [facility];  Service: Gastroenterology;;  random colon   COLONOSCOPY WITH PROPOFOL N/A 12/21/2019   Procedure: COLONOSCOPY WITH PROPOFOL;  Surgeon: Harvel Quale, MD;  Location: [facility];  Service: Gastroenterology;  Laterality: N/A;  915   MELANOMA EXCISION     Left facial region   REPLACEMENT TOTAL KNEE Left    RIGHT/LEFT HEART CATH AND CORONARY ANGIOGRAPHY N/A 02/23/2021   Procedure: RIGHT/LEFT HEART CATH AND CORONARY ANGIOGRAPHY;  Surgeon:  Patwardhan, Reynold Bowen, MD;  Location: [place] CV LAB;  Service: Cardiovascular;  Laterality: N/A;   UMBILICAL HERNIA REPAIR      Family History: Family History  Problem Relation Age of Onset   Diabetes Mother        Died in a house fire   Lung cancer Father    Hypertension Sister    Hypertension Sister    Hypertension Brother    Diabetes Brother    Hypertension Brother    Hypertension Other    Diabetes Other    Rheum arthritis Other    Stroke Other     Social History: Social History   Socioeconomic History   Marital status: Widowed    Spouse name: Not on file   Number of children: 2   Years of education: Not on file   Highest education level: Not on file  Occupational History   Not on file  Tobacco Use   Smoking status: Former    Packs/day: 2.00    Years: 30.00    Pack years: 60.00    Types: Cigarettes    Quit date: 05/17/1989    Years since quitting: 31.7   Smokeless tobacco: Never  Vaping Use   Vaping Use: Never used  Substance and Sexual Activity   Alcohol use: Not Currently   Drug use: No   Sexual activity: Not on file  Other Topics Concern   Not on file  Social History Narrative   Not on file    Social Determinants of Health   Financial Resource Strain: Not on file  Food Insecurity: Not on file  Transportation Needs: Not on file  Physical Activity: Not on file  Stress: Not on file  Social Connections: Not on file    Allergies:  No Known Allergies  Objective:    Vital Signs:   Temp:  [97.7 F (36.5 C)-97.9 F (36.6 C)] 97.8 F (36.6 C) (10/10 0535) Pulse Rate:  [37-127] 81 (10/10 1440) Resp:  [6-22] 21 (10/10 1440) BP: (109-164)/(62-133) 109/62 (10/10 1440) SpO2:  [94 %-99 %] 96 % (10/10 1440) Weight:  [93.8 kg] 93.8 kg (10/10 0535) Last BM Date: 02/20/21  Weight change: Filed Weights   02/21/21 1341 02/23/21 0535  Weight: 99.3 kg 93.8 kg    Intake/Output:   Intake/Output Summary (Last 24 hours) at 02/23/2021 1537 Last data filed at 02/23/2021 1500 Gross per 24 hour  Intake 1118.9 ml  Output 1100 ml  Net 18.9 ml      Physical Exam    General:  Well appearing. No resp difficulty HEENT: normal Neck: supple. No JVP . Carotids 2+ bilat; no bruits. No lymphadenopathy or thyromegaly appreciated. Cor: PMI nondisplaced. Irregularly irregular rhythm. No rubs, gallops or murmurs. Lungs: clear Abdomen: soft, nontender, nondistended. No hepatosplenomegaly. No bruits or masses. Good bowel sounds. Extremities: no cyanosis, clubbing, rash, edema Neuro: alert & orientedx3, cranial nerves grossly intact. moves all 4 extremities w/o difficulty. Affect pleasant   Telemetry   Afib 80s   EKG    Afib 102 bpm, w/ LBBB  Labs   Basic Metabolic Panel: Recent Labs  Lab 02/18/21 1534 02/21/21 1347 02/22/21 0131 02/23/21 0300 02/23/21 0811 02/23/21 0812  NA 139 134* 135 136 139 139  K 4.6 4.6 4.0 3.9 4.0 3.9  CL 101 103 103 104  --   --   CO2 23 23 23 23   --   --   GLUCOSE 149* 176* 133* 127*  --   --   BUN  14 16 14 14   --   --   CREATININE 0.98 0.93 1.19 0.76  --   --   CALCIUM 9.9 9.7 9.9 9.7  --   --   MG 1.7 1.9  --   --   --   --     Liver  Function Tests: No results for input(s): AST, ALT, ALKPHOS, BILITOT, PROT, ALBUMIN in the last 168 hours. No results for input(s): LIPASE, AMYLASE in the last 168 hours. No results for input(s): AMMONIA in the last 168 hours.  CBC: Recent Labs  Lab 02/18/21 1534 02/21/21 1347 02/22/21 0131 02/23/21 0300 02/23/21 0811 02/23/21 0812  WBC 8.1 7.0 7.7 7.0  --   --   HGB 16.0 16.5 15.9 16.4 17.0 16.7  HCT 46.6 49.6 47.0 48.4 50.0 49.0  MCV 87 93.2 90.2 89.8  --   --   PLT 293 312 299 257  --   --     Cardiac Enzymes: No results for input(s): CKTOTAL, CKMB, CKMBINDEX, TROPONINI in the last 168 hours.  BNP: BNP (last 3 results) Recent Labs    01/26/21 1443 02/21/21 1347  BNP 542.0* 487.0*    ProBNP (last 3 results) Recent Labs    02/05/21 1441 02/18/21 1534  PROBNP 1,567* 2,355*     CBG: Recent Labs  Lab 02/22/21 1113 02/22/21 1615 02/22/21 2136 02/23/21 0903 02/23/21 1116  GLUCAP 168* 173* 276* 106* 224*    Coagulation Studies: No results for input(s): LABPROT, INR in the last 72 hours.   Imaging   CARDIAC CATHETERIZATION  Addendum Date: 02/23/2021   LM: Normal LAD: Prox 80% stenosius at bifurcation with small to medium caliber D1 with 75% long prox disease. 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%, diabetic patient CVTS consult for CABG  Result Date: 02/23/2021 LM: Normal LAD: Prox 80% stenosius at bifurcation with small to medium caliber D1 with 75& long prox disease Ramus: Minimal luminal irregularities Lcx: OM1 with prox 80% stenosis RCA: 60-70% disease in PL branches RA: 2 mmHg RV: 22/0 mmHg PA: 21/5 mmHg, mPAP 13 mmHg PCW: 8 mmHg CO: 5.6 L/min CI: 2.5 L/min/m2 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%, diabetic patient CVTS  consult for CABG     Medications:     Current Medications:  aspirin EC  81 mg Oral Daily   dapagliflozin propanediol  10 mg Oral QAC breakfast   famotidine  40 mg Oral Daily   feeding supplement  237 mL Oral BID BM   [START ON 02/24/2021] influenza vaccine adjuvanted  0.5 mL Intramuscular Tomorrow-1000   insulin aspart  0-5 Units Subcutaneous QHS   insulin aspart  0-9 Units Subcutaneous TID WC   isosorbide mononitrate  20 mg Oral Daily   magnesium oxide  400 mg Oral TID   metoprolol tartrate  25 mg Oral BID   pantoprazole  40 mg Oral Daily   rosuvastatin  5 mg Oral QHS   sacubitril-valsartan  1 tablet Oral BID   sodium chloride flush  3 mL Intravenous Q12H   sodium chloride flush  3 mL Intravenous Q12H    Infusions:  sodium chloride     amiodarone 30 mg/hr (02/23/21 1500)   heparin     nitroGLYCERIN        Assessment/Plan  1. Acute Systolic Heart Failure  - Echo 9/22 EF 25-30%, RV normal  - ICM. 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 CI at 2.5  [facility] Class III. Optimized from fluid standpoint - Pending potential CABG vs PCI   - Continue Entresto 49-51 mg bid - Continue Farxiga 10 - Continue Imdur 30  - Switch from Lopressor to Toprol XL  - no need for loop diuretic currently   2. 2V CAD  - HS trop 11>>11 - LHC w/ 80% pLAD, 70-75% pD1, 80% pOM, 60-70% PL brand disease - currently pain free - Pending potential CABG but may be better suited for PCI  - continue ASA, statin + ? blocker  3. Persistent Atrial Fibrillation  - rate controlled on amio gtt - plan MAZE + LAAC if he goes for CABG - Plan TEE/DCCV if no CABG - Eliquis on hold for now - resume heparin gtt post cath   4. MR/TR - mod MR + TR on echo, likely functional  - potential MVR if CABG   5. Type 2DM  - Hgb A1c 8.1  - on Farxiga - management per primary    Length of Stay: 2  Lyda Jester, PA-C  02/23/2021, 3:37 PM  Advanced Heart  Failure Team Pager [phone] (M-F; 7a - 5p)  Please contact [facility] for night-coverage after hours (4p -7a ) and weekends on [email or web address]  Patient seen and examined with the above-signed Advanced Practice Provider and/or Housestaff. I personally reviewed laboratory data, imaging studies and relevant notes. I independently examined the patient and formulated the important aspects of the plan. I have edited the note to reflect any of my changes or salient points. I have personally discussed the plan with the patient and/or family.  75 y/o male with DM2, AF and new onset systolic HF with EF 59-45%. Found to have 2v CAD. We are asked by Dr. Kipp Brood to see prior to CABG to help with pre-op optimization.   Cath today by Dr. Virgina Jock reviewed. Proximal LAD and LCX focal disease. Distal PL disease. RHC numbers well compensated with low filling pressures and normal output.   At baseline very active with yardwork until he was slowed down with angina and HF. Remote h/o tobacco use.   General:  Well appearing. No resp difficulty HEENT: normal Neck: supple. no JVD. Carotids 2+ bilat; no bruits. No lymphadenopathy or thryomegaly appreciated. Cor: PMI nondisplaced. Irregular rate & rhythm. Soft MR Lungs: clear Abdomen: soft, nontender, nondistended. No hepatosplenomegaly. No bruits or masses. Good bowel sounds. Extremities: no cyanosis, clubbing, rash, edema Neuro: alert & orientedx3, cranial nerves grossly intact. moves all 4 extremities w/o difficulty. Affect pleasant  Agree with plan for CABG/Maze +/- MVR. Should be reasonable candidate for surgery. Based on functional status and RHC doubt there is much else we can do to further optimize except to keep him active on the floor.   We will follow at a distance as Dr. Brennan Bailey team is expertly managing his condition as primary team.   Glori Bickers, MD  5:00 PM

## 2021-02-23 NOTE — Progress Notes (Signed)
ANTICOAGULATION CONSULT NOTE - Follow Up Consult  Pharmacy Consult for heparin  Indication: chest pain/ACS and atrial fibrillation  No Known Allergies  Patient Measurements: Height: 6\' 6"  (198.1 cm) Weight: 93.8 kg (206 lb 14.4 oz) IBW/kg (Calculated) : 91.4 Heparin Dosing Weight: 99.3 kg   Vital Signs: Temp: 97.8 F (36.6 C) (10/10 0535) Temp Source: Oral (10/10 0535) BP: 134/66 (10/10 0920) Pulse Rate: 58 (10/10 0920)  Labs: Recent Labs    02/21/21 1347 02/21/21 1545 02/22/21 0131 02/22/21 0847 02/22/21 1807 02/23/21 0300  HGB 16.5  --  15.9  --   --  16.4  HCT 49.6  --  47.0  --   --  48.4  PLT 312  --  299  --   --  257  APTT  --   --   --  57* 110*  --   HEPARINUNFRC  --   --   --  0.76* 1.02* 0.75*  CREATININE 0.93  --  1.19  --   --  0.76  TROPONINIHS 11 11  --   --   --   --      Estimated Creatinine Clearance: 103.1 mL/min (by C-G formula based on SCr of 0.76 mg/dL).   Assessment: 52 YOM on apixaban PTA for afib (started ~2 weeks ago for new onset a fib). Presented to the ED with chest pain and shortness of breath, holding apixaban and on heparin for ischemic workup.  Pt s/p LHC today with mvCAD planning for CVTS consult. Heparin to resume 8h after sheath removal. This morning, heparin level slightly above goal at 0.75 however no aPTT drawn to correlate.   Goal of Therapy:  Heparin level 0.3-0.7 units/ml aPTT 66-102 seconds Monitor platelets by anticoagulation protocol: Yes   Plan:  -Restart heparin 1500 units/h no bolus at 1730 -Check heparin level and aPTT 8h  Arrie Senate, PharmD, [place], [facility] Clinical Pharmacist [phone] Please check AMION for all [place] numbers 02/23/2021

## 2021-02-23 NOTE — Consult Note (Addendum)
[address]       [place] [address]             [phone]        Derek Bond [place] Medical Record #[id number] Date of Birth: June 19, 1945  Referring: Rex Kras, DO Primary Care: Glenda Chroman, MD Primary Cardiologist: Rex Kras, DO  Reason for consult: Evaluation for surgical management of three-vessel artery disease  History of Present Illness:     Derek Bond is a 75 year old male with a past history of type 2 diabetes, dyslipidemia, and obstructive sleep apnea.  He is a former 60-pack-year smoker having quit in 1991.  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  He recently established care with Dr. Rex Kras for management of heart failure mid September being referred by PCP.  On initial presentation, patient reported an episode of chest discomfort back in August that was preceded by exertion.  He had associated shortness of breath, lower extremity edema, and 3 pillow orthopnea.  Work-up in the office included an EKG that showed atrial fibrillation which was a new diagnosis for Derek Bond.  An echocardiogram was obtained that demonstrated global hypokinesis with an ejection fraction estimated 25 to 30%.  There was moderate mitral insufficiency.  EKG showed atrial fibrillation with left bundle branch block.  The patient was started on Entresto and Lasix at the initial visit in mid September and by the time of his most recent follow-up visit on 9/28, his lower extremity edema had resolved and shortness of breath had improved considerably..  Further evaluation with left heart catheterization was recommended and had already been scheduled for tomorrow.  However, Derek Bond presented to the emergency room at [facility] this weekend with worsening shortness of breath and decreased energy.  He reported having intermittent chest tightness.  Work-up in the emergency room yesterday included  serial troponins which were flat.  There were no ischemic changes on EKG.  He was transferred to [facility] for further evaluation and management.  He was started on a heparin infusion along with IV amiodarone.  His chest pain resolved quickly.  Left heart catheterization was carried out earlier today and demonstrated three-vessel coronary artery disease.  Please see the cath report below. CT surgery has been asked to evaluate Derek Bond for consideration of coronary artery bypass grafting.  Currently, Derek Bond is resting in bed and says he is comfortable.  Denies any chest pain or shortness of breath.  He has just returned from left heart catheterization earlier this morning.  He has amiodarone infusing and will have the heparin restarted shortly.  He lives alone having lost his wife about 2 years ago.  She had a double lung transplant at [facility] 10 years previous.  He lives alone but has family close by.  He retired after working 40 years with [employer] at [employer].  He sees a dentist twice a year and currently has no dental issues that he is concerned about.  Current Activity/ Functional Status:    Zubrod Score: At the time of surgery this patient's most appropriate activity status/level should be described as: []     0    Normal activity, no symptoms []     1    Restricted in physical strenuous activity but ambulatory, able to do out light work []   2    Ambulatory and capable of self care, unable to do work activities, up and about                 more than 50%  Of the time                            [x]     3    Only limited self care, in bed greater than 50% of waking hours []     4    Completely disabled, no self care, confined to bed or chair []     5    Moribund  Past Medical History:  Diagnosis Date   CAD (coronary artery disease)    Mild nonobstructive disease at cardiac catheterization 2009   Cancer [facility])    Melanoma    Essential hypertension    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    Due to laceration at age 19    Hiatal hernia    History of melanoma    Obstructive sleep apnea    Type 2 diabetes mellitus ([place])     Past Surgical History:  Procedure Laterality Date   BIOPSY  12/21/2019   Procedure: BIOPSY;  Surgeon: Harvel Quale, MD;  Location: [facility];  Service: Gastroenterology;;  random colon   COLONOSCOPY WITH PROPOFOL N/A 12/21/2019   Procedure: COLONOSCOPY WITH PROPOFOL;  Surgeon: Harvel Quale, MD;  Location: [facility];  Service: Gastroenterology;  Laterality: N/A;  915   MELANOMA EXCISION     Left facial region   REPLACEMENT TOTAL KNEE Left    UMBILICAL HERNIA REPAIR      Social History   Tobacco Use  Smoking Status Former   Packs/day: 2.00   Years: 30.00   Pack years: 60.00   Types: Cigarettes   Quit date: 05/17/1989   Years since quitting: 31.7  Smokeless Tobacco Never    Social History   Substance and Sexual Activity  Alcohol Use Not Currently     No Known Allergies  Current Facility-Administered Medications  Medication Dose Route Frequency Provider Last Rate Last Admin   0.9 %  sodium chloride infusion   Intravenous Continuous Patwardhan, Manish J, MD 75 mL/hr at 02/23/21 0858 Rate Change at 02/23/21 0858   0.9 %  sodium chloride infusion  250 mL Intravenous PRN Patwardhan, Manish J, MD       acetaminophen (TYLENOL) tablet 1,000 mg  1,000 mg Oral Q6H PRN Tolia, Sunit, DO       acetaminophen (TYLENOL) tablet 650 mg  650 mg Oral Q4H PRN Patwardhan, Manish J, MD       alum & mag hydroxide-simeth (MAALOX/MYLANTA) 200-200-20 MG/5ML suspension 30 mL  30 mL Oral Q2H PRN Tolia, Sunit, DO   30 mL at 02/22/21 0005   amiodarone (NEXTERONE PREMIX) 360-4.14 MG/200ML-% (1.8 mg/mL) IV infusion  30 mg/hr Intravenous Continuous Margarita Mail, PA-C 16.67 mL/hr at 02/23/21 1001 30 mg/hr at 02/23/21 1001   aspirin EC tablet 81 mg  81 mg Oral Daily Tolia, Sunit, DO    81 mg at 02/23/21 0545   dapagliflozin propanediol (FARXIGA) tablet 10 mg  10 mg Oral QAC breakfast Tolia, Sunit, DO   10 mg at 02/23/21 0628   famotidine (PEPCID) tablet 40 mg  40 mg Oral Daily Tolia, Sunit, DO   40 mg at 02/23/21 0957   feeding supplement (ENSURE ENLIVE / ENSURE PLUS) liquid 237 mL  237 mL Oral BID  BM Tolia, Sunit, DO   237 mL at 02/23/21 1003   heparin ADULT infusion 100 units/mL (25000 units/26mL)  1,500 Units/hr Intravenous Continuous Kris Mouton, [place]   Stopped at 02/23/21 9702   hydrALAZINE (APRESOLINE) injection 10 mg  10 mg Intravenous Q20 Min PRN Patwardhan, Reynold Bowen, MD       [START ON 02/24/2021] influenza vaccine adjuvanted (FLUAD) injection 0.5 mL  0.5 mL Intramuscular Tomorrow-1000 Tolia, Sunit, DO       insulin aspart (novoLOG) injection 0-5 Units  0-5 Units Subcutaneous QHS Tolia, Sunit, DO   3 Units at 02/22/21 2210   insulin aspart (novoLOG) injection 0-9 Units  0-9 Units Subcutaneous TID WC Tolia, Sunit, DO   2 Units at 02/22/21 1703   isosorbide mononitrate (ISMO) tablet 20 mg  20 mg Oral Daily Tolia, Sunit, DO   20 mg at 02/23/21 0958   labetalol (NORMODYNE) injection 10 mg  10 mg Intravenous Q10 min PRN Patwardhan, Manish J, MD       magnesium oxide (MAG-OX) tablet 400 mg  400 mg Oral TID Tolia, Sunit, DO   400 mg at 02/23/21 0957   metoprolol tartrate (LOPRESSOR) tablet 25 mg  25 mg Oral BID Tolia, Sunit, DO   25 mg at 02/23/21 0958   nitroGLYCERIN (NITROSTAT) SL tablet 0.4 mg  0.4 mg Sublingual Q5 min PRN Tolia, Sunit, DO   0.4 mg at 02/22/21 0353   nitroGLYCERIN 50 mg in dextrose 5 % 250 mL (0.2 mg/mL) infusion  0-200 mcg/min Intravenous Titrated Cantwell, Celeste C, PA-C       ondansetron (ZOFRAN) injection 4 mg  4 mg Intravenous Q6H PRN Tolia, Sunit, DO       ondansetron (ZOFRAN) injection 4 mg  4 mg Intravenous Q6H PRN Patwardhan, Manish J, MD       pantoprazole (PROTONIX) EC tablet 40 mg  40 mg Oral Daily Tolia, Sunit, DO   40 mg at 02/23/21 0957    rosuvastatin (CRESTOR) tablet 5 mg  5 mg Oral QHS Tolia, Sunit, DO   5 mg at 02/22/21 2205   sacubitril-valsartan (ENTRESTO) 49-51 mg per tablet  1 tablet Oral BID Tolia, Sunit, DO   1 tablet at 02/23/21 0958   sodium chloride flush (NS) 0.9 % injection 3 mL  3 mL Intravenous Q12H Cantwell, Celeste C, PA-C   3 mL at 02/23/21 1002   sodium chloride flush (NS) 0.9 % injection 3 mL  3 mL Intravenous Q12H Patwardhan, Manish J, MD       sodium chloride flush (NS) 0.9 % injection 3 mL  3 mL Intravenous PRN Patwardhan, Manish J, MD        Medications Prior to Admission  Medication Sig Dispense Refill Last Dose   acetaminophen (TYLENOL) 500 MG tablet Take 1,000 mg by mouth every 6 (six) hours as needed for moderate pain or mild pain.   unknown   apixaban (ELIQUIS) 5 MG TABS tablet Take 1 tablet (5 mg total) by mouth 2 (two) times daily. 180 tablet 0 02/21/2021 at 0800   aspirin EC 81 MG tablet Take 81 mg by mouth daily.   02/21/2021   dapagliflozin propanediol (FARXIGA) 10 MG TABS tablet Take 1 tablet (10 mg total) by mouth daily before breakfast. 90 tablet 0 02/21/2021   glipiZIDE (GLUCOTROL XL) 10 MG 24 hr tablet Take 10 mg by mouth 2 (two) times daily.   02/21/2021   Insulin Degludec (TRESIBA FLEXTOUCH [place]) Inject 48 Units into the skin daily.  02/21/2021   Magnesium Oxide 400 MG CAPS Take 1 capsule (400 mg total) by mouth in the morning and at bedtime. (Patient taking differently: Take 400 mg by mouth 3 (three) times daily.) 180 capsule 0 02/21/2021   metFORMIN (GLUCOPHAGE) 1000 MG tablet Take 1,000 mg by mouth 2 (two) times daily with a meal.   02/21/2021   metoprolol tartrate (LOPRESSOR) 25 MG tablet Take 1 tablet (25 mg total) by mouth 2 (two) times daily. 60 tablet 2 02/21/2021 at 0800   nitroGLYCERIN (NITROSTAT) 0.4 MG SL tablet Place 0.4 mg under the tongue every 5 (five) minutes as needed for chest pain.   unknown   pantoprazole (PROTONIX) 40 MG tablet Take 40 mg by mouth daily.   02/21/2021    rosuvastatin (CRESTOR) 5 MG tablet Take 5 mg by mouth at bedtime.   02/20/2021   sacubitril-valsartan (ENTRESTO) 49-51 MG Take 1 tablet by mouth 2 (two) times daily. 180 tablet 0 02/21/2021   furosemide (LASIX) 20 MG tablet Take 1 tablet (20 mg total) by mouth daily. 90 tablet 3     Family History  Problem Relation Age of Onset   Diabetes Mother        Died in a house fire   Lung cancer Father    Hypertension Sister    Hypertension Sister    Hypertension Brother    Diabetes Brother    Hypertension Brother    Hypertension Other    Diabetes Other    Rheum arthritis Other    Stroke Other      Review of Systems:   ROS     Cardiac Review of Systems: Y or  [    ]= no  Chest Pain [   x ]  Resting SOB [  x ] Exertional SOB  [ x ]  Orthopnea [ x ]   Pedal Edema [ x  ]    Palpitations [  ] Syncope  [  ]   Presyncope [   ]  General Review of Systems: [Y] = yes [  ]=no Constitional: recent weight change [  ]; anorexia [  ]; fatigue [  ]; nausea [  ]; night sweats [  ]; fever [  ]; or chills [  ]                                                               Dental: Last Dentist visit: Within the past 6 months  Eye : blurred vision [  ]; diplopia [   ]; vision changes [  ];  Amaurosis fugax[  ]; Resp: cough [  ];  wheezing[  ];  hemoptysis[  ]; shortness of breath[  ]; paroxysmal nocturnal dyspnea[  ]; dyspnea on exertion[  ]; or orthopnea[  ];  GI:  gallstones[  ], vomiting[  ];  dysphagia[  ]; melena[  ];  hematochezia [  ]; heartburn[  ];   Hx of  Colonoscopy[  ]; GU: kidney stones [  ]; hematuria[  ];   dysuria [  ];  nocturia[  ];  history of     obstruction [  ]; urinary frequency [  ]             Skin: rash, swelling[  ];, hair loss[  ];  peripheral edema[  ];  or itching[  ]; Musculosketetal: myalgias[  ];  joint swelling[  ];  joint erythema[  ];  joint pain[  ];  back pain[  ];  Heme/Lymph: bruising[  ];  bleeding[  ];  anemia[  ];  Neuro: TIA[  ];  headaches[  ];  stroke[  ];   vertigo[  ];  seizures[  ];   paresthesias[  ];  difficulty walking[  ];  Psych:depression[  ]; anxiety[  ];  Endocrine: diabetes[ x ];  thyroid dysfunction[  ];                Physical Exam: BP 134/66   Pulse (!) 58   Temp 97.8 F (36.6 C) (Oral)   Resp 18   Ht 6\' 6"  (1.981 m)   Wt 93.8 kg   SpO2 97%   BMI 23.91 kg/m    General appearance: alert, cooperative, and no distress Head: Normocephalic, without obvious abnormality, atraumatic Neck: no adenopathy, no carotid bruit, no JVD, and supple, symmetrical, trachea midline Lymph nodes: There are no palpable cervical or clavicular lymph nodes Resp: clear to auscultation bilaterally Cardio: irregularly irregular rhythm and heart rate is around 100.  There are no murmurs. GI: soft, non-tender; bowel sounds normal; no masses,  no organomegaly Extremities: No obvious deformity.  The right radial artery is compressed with a TR band.  The hand is warm and well-perfused.  On the left, the ulnar pulse is much more prominent than the radial.  He has palpable dorsalis pedis pulses and posterior tibial pulses bilaterally.  Lower extremity showed no evidence of venous stasis or significant varicosities. Neurologic: Grossly normal  Diagnostic Studies & Laboratory data:     Recent Radiology Findings:   DG Chest 2 View  Result Date: 02/21/2021 CLINICAL DATA:  Chest pain and heaviness, nonradiating. Shortness of breath. Symptoms for 1 month. History coronary artery disease, former smoker, hypertension, melanoma, type II diabetes mellitus EXAM: CHEST - 2 VIEW COMPARISON:  03/14/2021 FINDINGS: Enlargement of cardiac silhouette. Mediastinal contours and pulmonary vascularity normal. Lungs clear. No infiltrate, pleural effusion, or pneumothorax. Osseous structures unremarkable. IMPRESSION: Enlargement of cardiac silhouette. No acute abnormalities. Electronically Signed   By: Lavonia Dana M.D.   On: 02/21/2021 14:25   RIGHT/LEFT HEART CATH AND CORONARY  ANGIOGRAPHY   Conclusion  LM: Normal LAD: Prox 80% stenosius at bifurcation with small to medium caliber D1 with 75% long prox disease. 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 Hypokinetic  global wall motion with regionality involving septal wall. Left ventricle  cavity is mildly dilated. Moderate left ventricular hypertrophy. Unable to  evaluate diastolic function due to atrial fibrillation. Elevated LAP.  Moderate (Grade II) mitral regurgitation.  Moderate tricuspid regurgitation. Mild pulmonary hypertension, RVSP  36mmHg.  IVC is dilated with a respiratory response of <50%.  No prior study for comparison.   I have independently reviewed the above radiologic studies and discussed with the patient   Recent Lab Findings: Lab Results  Component Value Date   WBC 7.0 02/23/2021   HGB 16.4 02/23/2021   HCT 48.4 02/23/2021  PLT 257 02/23/2021   GLUCOSE 127 (H) 02/23/2021   CHOL 99 02/22/2021   TRIG 56 02/22/2021   HDL 44 02/22/2021   LDLCALC 44 02/22/2021   NA 136 02/23/2021   K 3.9 02/23/2021   CL 104 02/23/2021   CREATININE 0.76 02/23/2021   BUN 14 02/23/2021   CO2 23 02/23/2021   TSH 2.348 02/22/2021   HGBA1C 8.1 (H) 02/22/2021      Assessment / Plan:      -Pleasant 75 year old male with three-vessel coronary artery disease with reduced LV function and at least moderate mitral insufficiency. The echo report does not mention any further details about the mitral valve and there is no mention of the aortic valve structure or function.  He is most certainly symptomatic in regards to the coronary artery  disease and would benefit from coronary revascularization.  Application of a clip to the left atrial appendage and possibly a MAZE procedure will also be considered.  He is currently comfortable and pain-free.  The cardiology team plans to resume the heparin infusion once the TR band is removed.  Dr. Kipp Brood will review clinical data and more detailed recommendations regarding surgery will follow.  Derek Bond is tentatively scheduled for coronary artery bypass grafting, left atrial clip, and possible MAZE on Thursday, 02/26/2021.   -Recent onset atrial fibrillation-on amiodarone infusion, rate is controlled  -Dyslipidemia  -History of melanoma of the left face-treated in 1996-1997 with lymph node dissection and irradiated melanoma cell vaccines. He has had no evidence of recurrence on subsequent surveillance  -Type 2 diabetes mellitus-treated with metformin, insulin, glipizide, and Farxiga.  Admission hemoglobin A1c was 8.1  -Hypertension-well-controlled   I  spent 30 minutes counseling the patient face to face.   Antony Odea, PA-C  02/23/2021 10:25 AM   Agree with above. This is a 75 year old gentleman is admitted with newly diagnosed heart failure symptoms.  He underwent a left heart cath which showed severe three-vessel coronary disease.  He also has a history of atrial fibrillation.  On echocardiogram he has and LV function of 30 to 35%, moderate mitral valve regurgitation, and moderate tricuspid valve regurgitation.  His right heart function is normal.  I have been unable to view his echocardiogram, thus we have ordered a new 1.  If his mitral valve regurgitation is mostly central than it likely will respond to revascularization without valve intervention.  We will also need to assess his size of his left atrium to determine whether or not a maze would be beneficial or not.  In regards to his coronary anatomy he does have good targets for revascularization.  We will continue  medical optimization for now, and obtain appropriate studies.  Will determine surgical planning based off of the echocardiogram.  Lajuana Matte

## 2021-02-24 ENCOUNTER — Inpatient Hospital Stay (HOSPITAL_COMMUNITY): Payer: Medicare PPO

## 2021-02-24 ENCOUNTER — Ambulatory Visit (HOSPITAL_COMMUNITY): Admission: RE | Admit: 2021-02-24 | Payer: Medicare PPO | Source: Home / Self Care | Admitting: Cardiology

## 2021-02-24 ENCOUNTER — Encounter (HOSPITAL_COMMUNITY): Admission: RE | Payer: Self-pay | Source: Home / Self Care

## 2021-02-24 DIAGNOSIS — Z01810 Encounter for preprocedural cardiovascular examination: Secondary | ICD-10-CM | POA: Diagnosis not present

## 2021-02-24 LAB — URINALYSIS, ROUTINE W REFLEX MICROSCOPIC
Bacteria, UA: NONE SEEN
Bilirubin Urine: NEGATIVE
Glucose, UA: >=500 mg/dL — AB
Hgb urine dipstick: NEGATIVE
Ketones, ur: NEGATIVE mg/dL
Leukocytes,Ua: NEGATIVE
Nitrite: NEGATIVE
Protein, ur: NEGATIVE mg/dL
Specific Gravity, Urine: 1.036 — ABNORMAL HIGH (ref 1.005–1.030)
pH: 5 (ref 5.0–8.0)

## 2021-02-24 LAB — CBC
HCT: 47.5 % (ref 39.0–52.0)
Hemoglobin: 16.1 g/dL (ref 13.0–17.0)
MCH: 30.3 pg (ref 26.0–34.0)
MCHC: 33.9 g/dL (ref 30.0–36.0)
MCV: 89.3 fL (ref 80.0–100.0)
Platelets: 299 10*3/uL (ref 150–400)
RBC: 5.32 MIL/uL (ref 4.22–5.81)
RDW: 15.3 % (ref 11.5–15.5)
WBC: 7.4 10*3/uL (ref 4.0–10.5)
nRBC: 0 % (ref 0.0–0.2)

## 2021-02-24 LAB — BASIC METABOLIC PANEL
Anion gap: 10 (ref 5–15)
BUN: 13 mg/dL (ref 8–23)
CO2: 24 mmol/L (ref 22–32)
Calcium: 9.8 mg/dL (ref 8.9–10.3)
Chloride: 104 mmol/L (ref 98–111)
Creatinine, Ser: 0.88 mg/dL (ref 0.61–1.24)
GFR, Estimated: >60 mL/min (ref >60)
Glucose, Bld: 124 mg/dL — ABNORMAL HIGH (ref 70–99)
Potassium: 4.1 mmol/L (ref 3.5–5.1)
Sodium: 138 mmol/L (ref 135–145)

## 2021-02-24 LAB — HEPARIN LEVEL (UNFRACTIONATED)
Heparin Unfractionated: 0.39 IU/mL (ref 0.30–0.70)
Heparin Unfractionated: 0.49 IU/mL (ref 0.30–0.70)

## 2021-02-24 LAB — GLUCOSE, CAPILLARY
Glucose-Capillary: 164 mg/dL — ABNORMAL HIGH (ref 70–99)
Glucose-Capillary: 299 mg/dL — ABNORMAL HIGH (ref 70–99)
Glucose-Capillary: 225 mg/dL — ABNORMAL HIGH (ref 70–99)
Glucose-Capillary: 265 mg/dL — ABNORMAL HIGH (ref 70–99)

## 2021-02-24 LAB — SURGICAL PCR SCREEN
MRSA, PCR: NEGATIVE
Staphylococcus aureus: POSITIVE — AB

## 2021-02-24 LAB — APTT
aPTT: 62 seconds — ABNORMAL HIGH (ref 24–36)
aPTT: 77 seconds — ABNORMAL HIGH (ref 24–36)

## 2021-02-24 SURGERY — RIGHT/LEFT HEART CATH AND CORONARY ANGIOGRAPHY
Anesthesia: LOCAL

## 2021-02-24 MED ORDER — LOPERAMIDE HCL 2 MG PO CAPS
2.0000 mg | ORAL_CAPSULE | ORAL | Status: DC | PRN
  Administered 2021-02-24 (×2): 2 mg via ORAL
  Filled 2021-02-24 (×3): qty 1

## 2021-02-24 NOTE — Progress Notes (Signed)
Pre-CABG Dopplers completed. Refer to "CV Proc" under chart review to view preliminary results.  02/24/2021 3:33 PM Kelby Aline., MHA, RVT, RDCS, RDMS

## 2021-02-24 NOTE — Progress Notes (Signed)
Initial Nutrition Assessment  DOCUMENTATION CODES:   Non-severe (moderate) malnutrition in context of chronic illness  INTERVENTION:  Encourage PO intake  Continue Ensure Enlive po BID, each supplement provides 350 kcal and 20 grams of protein  NUTRITION DIAGNOSIS:   Moderate Malnutrition related to chronic illness as evidenced by mild fat depletion, moderate muscle depletion.   GOAL:   Patient will meet greater than or equal to 90% of their needs   MONITOR:   PO intake, Diet advancement, Weight trends  REASON FOR ASSESSMENT:   Malnutrition Screening Tool    ASSESSMENT:   Pt admitted to hospital with SOB, chest pain, and fatigue and determined secondary to afib. He was previously scheduled for cardiac catheterization for 02/24/21. Plans for possible CABG. PMH includes cardiomyopathy, chronic HFrEF, HTN, T2DM, hyperlipidemia, former smoker, sleep apnea and persistent afib.  10/10: L heart cath- three-vessel CAD 10/13: CABG planned  Pt's daughter at bedside during visit. Pt reports having a good appetite PTA. He lives at home alone and cooks for himself and sometimes eats out. A typical day meal includes: eggs and toast for breakfast, a sandwich for lunch and a cheeseburger with a side for dinner. PTA he was having some hypoglycemia and states that his MD was changing his medications. D/t this his daughter was asking for a referral after d/c to a dietitian.   Meal Completion:  10/9-10/10: 100% x 5 recorded meals  He states that about 2-3 weeks ago his weight was 225-230 lbs. His MD started him on a diuretic and now reports a current weight of now 204 lbs. This is a 6% weight loss in 2 weeks.  Medications: farxiga, pepcid, ensure enlive, SSI, protonix  Labs: CBG 106-245 x24 hours  NUTRITION - FOCUSED PHYSICAL EXAM:  Flowsheet Row Most Recent Value  Orbital Region Mild depletion  Upper Arm Region Moderate depletion  Thoracic and Lumbar Region No depletion  Buccal  Region Mild depletion  Temple Region Mild depletion  Clavicle Bone Region No depletion  Clavicle and Acromion Bone Region No depletion  Scapular Bone Region No depletion  Dorsal Hand Mild depletion  Patellar Region Moderate depletion  Anterior Thigh Region Moderate depletion  Posterior Calf Region Mild depletion  Edema (RD Assessment) None  Hair Reviewed  Eyes Reviewed  Mouth Reviewed  Skin Reviewed  Nails Reviewed       Diet Order:   Diet Order             Diet Heart Room service appropriate? Yes; Fluid consistency: Thin  Diet effective now                   EDUCATION NEEDS:   Education needs have been addressed  Skin:  Skin Assessment: Reviewed RN Assessment  Last BM:  02/20/21  Height:   Ht Readings from Last 1 Encounters:  02/21/21 6\' 6"  (1.981 m)    Weight:   Wt Readings from Last 1 Encounters:  02/24/21 92.8 kg    BMI:  Body mass index is 23.64 kg/m.  Estimated Nutritional Needs:   Kcal:  2952-8413  Protein:  115-130g  Fluid:  >2.3L   Clayborne Dana, RDN, LDN Clinical Nutrition

## 2021-02-24 NOTE — Progress Notes (Signed)
Inpatient Diabetes Program Recommendations  AACE/ADA: New Consensus Statement on Inpatient Glycemic Control   Target Ranges:  Prepandial:   less than 140 mg/dL      Peak postprandial:   less than 180 mg/dL (1-2 hours)      Critically ill patients:  140 - 180 mg/dL   Results for KEAN, GAUTREAU (MRN [id number]) as of 02/24/2021 13:34  Ref. Range 02/23/2021 09:03 02/23/2021 11:16 02/23/2021 16:55 02/23/2021 21:50 02/24/2021 07:47 02/24/2021 11:30  Glucose-Capillary Latest Ref Range: 70 - 99 mg/dL 106 (H) 224 (H) 245 (H) 212 (H) 164 (H) 299 (H)    Review of Glycemic Control  Diabetes history: DM2 Outpatient Diabetes medications: Tresiba 48 units daily, Farxiga 5 mg daily, Glipizide XL 10 mg BID, Metformin 1000 mg BID Current orders for Inpatient glycemic control: Novolog 0-9 units TID with meals, Novolog 0-5 units QHS, Farxiga 10 mg QAM  Inpatient Diabetes Program Recommendations:    Insulin: Please consider ordering Novolog 4 units TID with meals for meal coverage if patient eats at least 50% of meals.  Thanks, Barnie Alderman, RN, MSN, CDE Diabetes Coordinator Inpatient Diabetes Program [phone] (Team Pager from 8am to 5pm)

## 2021-02-24 NOTE — Progress Notes (Signed)
[place] for Heparin (Apixaban on hold) Indication: chest pain/ACS and atrial fibrillation  No Known Allergies  Patient Measurements: Height: 6\' 6"  (198.1 cm) Weight: 93.8 kg (206 lb 14.4 oz) IBW/kg (Calculated) : 91.4   Vital Signs: Temp: 97.7 F (36.5 C) (10/10 2009) Temp Source: Oral (10/10 2009) BP: 120/68 (10/10 2009) Pulse Rate: 87 (10/10 2009)  Labs: Recent Labs    02/21/21 1347 02/21/21 1545 02/22/21 0131 02/22/21 0847 02/22/21 0847 02/22/21 1807 02/23/21 0300 02/23/21 0811 02/23/21 0812 02/24/21 0057  HGB 16.5  --  15.9  --   --   --  16.4 17.0 16.7 16.1  HCT 49.6  --  47.0  --   --   --  48.4 50.0 49.0 47.5  PLT 312  --  299  --   --   --  257  --   --  299  APTT  --   --   --  57*  --  110*  --   --   --  62*  HEPARINUNFRC  --   --   --  0.76*   < > 1.02* 0.75*  --   --  0.39  CREATININE 0.93  --  1.19  --   --   --  0.76  --   --   --   TROPONINIHS 11 11  --   --   --   --   --   --   --   --    < > = values in this interval not displayed.     Estimated Creatinine Clearance: 103.1 mL/min (by C-G formula based on SCr of 0.76 mg/dL).   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 Anticipate using aPTT to dose for now.   10/11 AM update:  aPTT low after re-start s/p cath Likely CABG on 10/13  Goal of Therapy:  Heparin level 0.3-0.7 units/ml aPTT  66-102 seconds Monitor platelets by anticoagulation protocol: Yes   Plan:  Inc heparin to 1600 units/hr 1100 aPTT and heparin level Possible CABG 10/13  Narda Bonds, PharmD, [place] Clinical Pharmacist Phone: [phone]

## 2021-02-24 NOTE — Progress Notes (Signed)
CARDIAC REHAB PHASE I   PRE:  Rate/Rhythm: 90 afib    BP: lying 105/82    SaO2: 98 RA  MODE:  Ambulation: 370 ft   POST:  Rate/Rhythm: 105 afib    BP: sitting 105/82     SaO2: 96 RA  Pt tall but able to stand without arms after instruction with moderate effort. Slightly unsteady in hall, held to IV pole. Some SOB, pt sts not significant. VSS. To recliner.  Discussed with pt and daughter IS (2200 ml), sternal precautions, mobility post op, and d/c planning. Receptive. Sts he will have someone with him at d/c. [place], ACSM 02/24/2021 2:53 PM

## 2021-02-24 NOTE — Progress Notes (Signed)
Progress Note  Patient Name: Derek Bond Date of Encounter: 02/24/2021  Attending physician: Rex Kras, DO Primary care provider: Glenda Chroman, MD Primary Cardiologist: Rex Kras, DO, [facility]  Subjective: Derek Bond is a 75 y.o. male who was seen and examined at bedside. No family present at bedside. No events overnight. Denies chest pain at rest or with effort related activities around the room Overall euvolemic and not in overt congestive heart failure  Objective: Vital Signs in the last 24 hours: Temp:  [97.7 F (36.5 C)] 97.7 F (36.5 C) (10/11 1406) Pulse Rate:  [84-99] 95 (10/11 0826) Resp:  [13-18] 16 (10/11 1406) BP: (105-148)/(68-92) 105/82 (10/11 1406) SpO2:  [95 %-97 %] 96 % (10/11 1406) Weight:  [92.8 kg] 92.8 kg (10/11 0450)  Intake/Output:  Intake/Output Summary (Last 24 hours) at 02/24/2021 1821 Last data filed at 02/24/2021 1500 Gross per 24 hour  Intake 1336.3 ml  Output 1100 ml  Net 236.3 ml    Net IO Since Admission: 862.16 mL [02/24/21 1821]  Weights:  Filed Weights   02/21/21 1341 02/23/21 0535 02/24/21 0450  Weight: 99.3 kg 93.8 kg 92.8 kg    Telemetry: Personally reviewed.  Atrial fibrillation  Physical examination: PHYSICAL EXAM: Vitals with BMI 02/24/2021 02/24/2021 02/24/2021  Height - - -  Weight - - -  BMI - - -  Systolic 793 903 009  Diastolic 82 92 92  Pulse - 95 99    CONSTITUTIONAL: Well-developed and well-nourished. No acute distress.  SKIN: Skin is warm and dry. No rash noted. No cyanosis. No pallor. No jaundice HEAD: Normocephalic and atraumatic.  EYES: No scleral icterus MOUTH/THROAT: Moist oral membranes.  NECK: No JVD present. No thyromegaly noted. No carotid bruits  LYMPHATIC: No visible cervical adenopathy.  CHEST Normal respiratory effort. No intercostal retractions  LUNGS: Clear to auscultation bilaterally.  No stridor. No wheezes. No rales.  CARDIOVASCULAR: Irregularly irregular, variable S1-S2,  no murmurs rubs or gallops appreciated. ABDOMINAL: Soft, nontender, nondistended, positive bowel sounds in all 4 quadrants no apparent ascites.  EXTREMITIES: No peripheral edema, warm to touch, 2+ DP and PT pulses bilaterally. HEMATOLOGIC: No significant bruising NEUROLOGIC: Oriented to person, place, and time. Nonfocal. Normal muscle tone.  PSYCHIATRIC: Normal mood and affect. Normal behavior. Cooperative  Lab Results: Hematology Recent Labs  Lab 02/22/21 0131 02/23/21 0300 02/23/21 0811 02/23/21 0812 02/24/21 0057  WBC 7.7 7.0  --   --  7.4  RBC 5.21 5.39  --   --  5.32  HGB 15.9 16.4 17.0 16.7 16.1  HCT 47.0 48.4 50.0 49.0 47.5  MCV 90.2 89.8  --   --  89.3  MCH 30.5 30.4  --   --  30.3  MCHC 33.8 33.9  --   --  33.9  RDW 15.0 15.1  --   --  15.3  PLT 299 257  --   --  299    Chemistry Recent Labs  Lab 02/22/21 0131 02/23/21 0300 02/23/21 0811 02/23/21 0812 02/24/21 0057  NA 135 136 139 139 138  K 4.0 3.9 4.0 3.9 4.1  CL 103 104  --   --  104  CO2 23 23  --   --  24  GLUCOSE 133* 127*  --   --  124*  BUN 14 14  --   --  13  CREATININE 1.19 0.76  --   --  0.88  CALCIUM 9.9 9.7  --   --  9.8  GFRNONAA >  60 >60  --   --  >60  ANIONGAP 9 9  --   --  10     Cardiac Enzymes: Cardiac Panel (last 3 results) No results for input(s): CKTOTAL, CKMB, TROPONINIHS, RELINDX in the last 72 hours.  BNP (last 3 results) Recent Labs    01/26/21 1443 02/21/21 1347  BNP 542.0* 487.0*    ProBNP (last 3 results) Recent Labs    02/05/21 1441 02/18/21 1534  PROBNP 1,567* 2,355*     DDimer No results for input(s): DDIMER in the last 168 hours.   Hemoglobin A1c:  Lab Results  Component Value Date   HGBA1C 8.1 (H) 02/22/2021   MPG 185.77 02/22/2021    TSH  Recent Labs    02/21/21 1347 02/22/21 0131  TSH 1.615 2.348    Lipid Panel     Component Value Date/Time   CHOL 99 02/22/2021 0131   TRIG 56 02/22/2021 0131   HDL 44 02/22/2021 0131   CHOLHDL 2.3  02/22/2021 0131   VLDL 11 02/22/2021 0131   LDLCALC 44 02/22/2021 0131    Imaging: CARDIAC CATHETERIZATION  Addendum Date: 02/23/2021   LM: Normal LAD: Prox 80% stenosius at bifurcation with small to medium caliber D1 with 75% long prox disease. 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%, diabetic patient CVTS consult for CABG  Result Date: 02/23/2021 LM: Normal LAD: Prox 80% stenosius at bifurcation with small to medium caliber D1 with 75& long prox disease Ramus: Minimal luminal irregularities Lcx: OM1 with prox 80% stenosis RCA: 60-70% disease in PL branches RA: 2 mmHg RV: 22/0 mmHg PA: 21/5 mmHg, mPAP 13 mmHg PCW: 8 mmHg CO: 5.6 L/min CI: 2.5 L/min/m2 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%, diabetic patient CVTS consult for CABG   VAS US DOPPLER PRE CABG  Result Date: 02/24/2021 PREOPERATIVE VASCULAR EVALUATION Patient Name:  Derek Bond  Date of Exam:   02/24/2021 Medical Rec #: [id number]        Accession #:    [id number] Date of Birth: Nov 16, 1945         Patient Gender: M Patient Age:   40 years Exam Location:  [facility] Procedure:      VAS US DOPPLER PRE CABG Referring Phys: HARRELL LIGHTFOOT --------------------------------------------------------------------------------  Indications:      Pre-CABG. Risk Factors:     Hypertension, Diabetes, coronary artery disease. Comparison Study: No prior study Performing Technologist: Maudry Mayhew MHA, RVT, RDCS, RDMS  Examination Guidelines: A complete evaluation includes B-mode imaging, spectral Doppler, color Doppler, and power Doppler as needed of all accessible portions of each vessel. Bilateral testing is considered an integral part of a complete examination. Limited examinations for reoccurring indications  may be performed as noted.  Right Carotid Findings: +----------+--------+--------+--------+-----------------------+--------+           PSV cm/sEDV cm/sStenosisDescribe               Comments +----------+--------+--------+--------+-----------------------+--------+ CCA Prox  52      11              smooth and heterogenous         +----------+--------+--------+--------+-----------------------+--------+ CCA Distal74      10              heterogenous and smooth         +----------+--------+--------+--------+-----------------------+--------+  ICA Prox  57      13              irregular and calcific          +----------+--------+--------+--------+-----------------------+--------+ ICA Distal83      14                                              +----------+--------+--------+--------+-----------------------+--------+ ECA       80                                                      +----------+--------+--------+--------+-----------------------+--------+ +----------+--------+-------+----------------+------------+           PSV cm/sEDV cmsDescribe        Arm Pressure +----------+--------+-------+----------------+------------+ [id number]             Multiphasic, WNL             +----------+--------+-------+----------------+------------+ +---------+--------+--+--------+-+---------+ VertebralPSV cm/s31EDV cm/s6Antegrade +---------+--------+--+--------+-+---------+ Left Carotid Findings: +----------+--------+--------+--------+-------------------------+--------+           PSV cm/sEDV cm/sStenosisDescribe                 Comments +----------+--------+--------+--------+-------------------------+--------+ CCA Prox  86      8                                                 +----------+--------+--------+--------+-------------------------+--------+ CCA Distal54      15              smooth and heterogenous            +----------+--------+--------+--------+-------------------------+--------+ ICA Prox  79      11              heterogenous and calcific         +----------+--------+--------+--------+-------------------------+--------+ ICA Distal65      25                                                +----------+--------+--------+--------+-------------------------+--------+ ECA       116                     smooth and heterogenous           +----------+--------+--------+--------+-------------------------+--------+  +----------+--------+--------+----------------+------------+ SubclavianPSV cm/sEDV cm/sDescribe        Arm Pressure +----------+--------+--------+----------------+------------+           132             Multiphasic, WNL             +----------+--------+--------+----------------+------------+ +---------+--------+--+--------+--+---------+ VertebralPSV cm/s42EDV cm/s13Antegrade +---------+--------+--+--------+--+---------+  ABI Findings: +---------+------------------+-----+----------+--------+ Right    Rt Pressure (mmHg)IndexWaveform  Comment  +---------+------------------+-----+----------+--------+ Brachial 130                    triphasic          +---------+------------------+-----+----------+--------+ PTA      131               1.01 biphasic           +---------+------------------+-----+----------+--------+  DP       104               0.80 monophasic         +---------+------------------+-----+----------+--------+ Great Toe146               1.12                    +---------+------------------+-----+----------+--------+ +---------+------------------+-----+---------+-------+ Left     Lt Pressure (mmHg)IndexWaveform Comment +---------+------------------+-----+---------+-------+ Brachial 124                    triphasic        +---------+------------------+-----+---------+-------+ PTA      118               0.91 biphasic          +---------+------------------+-----+---------+-------+ DP       112               0.86 biphasic         +---------+------------------+-----+---------+-------+ Great Toe61                0.47                  +---------+------------------+-----+---------+-------+ +-------+---------------+----------------+ ABI/TBIToday's ABI/TBIPrevious ABI/TBI +-------+---------------+----------------+ Right  1.01/1.12                       +-------+---------------+----------------+ Left   0.91/0.47                       +-------+---------------+----------------+  Right Doppler Findings: +-----------+--------+-----+---------+----------------------------------------+ Site       PressureIndexDoppler  Comments                                 +-----------+--------+-----+---------+----------------------------------------+ Brachial   130          triphasic                                         +-----------+--------+-----+---------+----------------------------------------+ Radial                  triphasic                                         +-----------+--------+-----+---------+----------------------------------------+ Ulnar                   triphasic                                         +-----------+--------+-----+---------+----------------------------------------+ Palmar Arch                      Unable to adequately assess due to                                        arrhythmia                               +-----------+--------+-----+---------+----------------------------------------+  Left Doppler Findings: +--------+--------+-----+---------+-------------------------------------------+  Site    PressureIndexDoppler  Comments                                    +--------+--------+-----+---------+-------------------------------------------+ [id number]          triphasic                                             +--------+--------+-----+---------+-------------------------------------------+ Radial               triphasic                                            +--------+--------+-----+---------+-------------------------------------------+ Ulnar                triphasic                                            +--------+--------+-----+---------+-------------------------------------------+ Digit                         Unable to adequately assess due to                                        arrhythmia                                  +--------+--------+-----+---------+-------------------------------------------+  Summary: Right Carotid: Velocities in the right ICA are consistent with a 1-39% stenosis. Left Carotid: Velocities in the left ICA are consistent with a 1-39% stenosis. Vertebrals:  Bilateral vertebral arteries demonstrate antegrade flow. Subclavians: Normal flow hemodynamics were seen in bilateral subclavian              arteries. Right ABI: Resting right ankle-brachial index is within normal range. No evidence of significant right lower extremity arterial disease. The right toe-brachial index is normal. 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 The left toe-brachial index is abnormal.    Preliminary     Cardiac database: EKG: 01/26/2021: Atrial fibrillation with rapid ventricular rate 126 bpm, left axis deviation, left anterior fascicular block, left bundle branch block, consider old inferior infarct. 02/11/2021: Atrial fibrillation, 105 bpm, left axis, left bundle branch block.  02/22/2021: Atrial fibrillation with controlled ventricular response at a rate of 85 bpm.  Left axis.  Left bundle branch block.  Echocardiogram: 02/05/2021:  Severely depressed LV systolic function with visual EF 25-30%. Hypokinetic global wall motion with regionality involving septal wall. Left ventricle cavity is mildly dilated. Moderate left  ventricular hypertrophy. Unable to evaluate diastolic function due to atrial fibrillation. Elevated LAP.  Moderate (Grade II) mitral regurgitation.  Moderate tricuspid regurgitation. Mild pulmonary hypertension, RVSP 48mmHg.  IVC is dilated with a respiratory response of <50%.  No prior study for comparison.  Stress test: MPI 02/12/2016: No diagnostic ST segment changes to indicate ischemia. No significant arrhythmias. Small, mild intensity, partially reversible mid to basal inferior defect suggestive of variable  soft tissue attenuation. No large ischemic zones are noted. This is a low risk study. Nuclear stress EF: 55%.  Heart catheterization: Left and right heart catheterization 02/23/2021 LM: Normal LAD: Prox 80% stenosius at bifurcation with small to medium caliber D1 with 75% long prox disease. Mid 70% disease Ramus: Minimal luminal irregularities Lcx: OM1 with prox 80% stenosis RCA: 60-70% disease in PL branches   RA: 2 mmHg RV: 22/0 mmHg PA: 21/5 mmHg, mPAP 13 mmHg PCW: 8 mmHg   CO: 5.6 L/min CI: 2.5 L/min/m2     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%, diabetic patient CVTS consult for CABG  Scheduled Meds:  aspirin EC  81 mg Oral Daily   dapagliflozin propanediol  10 mg Oral QAC breakfast   famotidine  40 mg Oral Daily   feeding supplement  237 mL Oral BID BM   influenza vaccine adjuvanted  0.5 mL Intramuscular Tomorrow-1000   insulin aspart  0-5 Units Subcutaneous QHS   insulin aspart  0-9 Units Subcutaneous TID WC   isosorbide mononitrate  20 mg Oral Daily   magnesium oxide  400 mg Oral TID   metoprolol tartrate  25 mg Oral BID   pantoprazole  40 mg Oral Daily   rosuvastatin  5 mg Oral QHS   sacubitril-valsartan  1 tablet Oral BID   sodium chloride flush  3 mL Intravenous Q12H   sodium chloride flush  3 mL Intravenous Q12H    Continuous Infusions:  sodium chloride     amiodarone 30 mg/hr (02/24/21 1126)   heparin 1,600 Units/hr (02/24/21 1010)    nitroGLYCERIN      PRN Meds: sodium chloride, acetaminophen, acetaminophen, alum & mag hydroxide-simeth, loperamide, nitroGLYCERIN, ondansetron (ZOFRAN) IV, ondansetron (ZOFRAN) IV, sodium chloride flush   IMPRESSION & RECOMMENDATIONS: Derek Bond is a 75 y.o. male whose past medical history and cardiac risk factors include: history of ischemic cardiomyopathy, chronic HFrEF, hypertension, diabetes mellitus, hyperlipidemia, former smoker, sleep apnea (not on CPAP), persistent atrial fibrillation, and erectile dysfunction  Multivessel CAD with unstable angina on presentation: Underwent left heart catheterization on 02/23/2021 and noted to have 2v CAD in the setting of ischemic cardiomyopathy, atrial fibrillation, & diabetes mellitus CT surgery was consulted for surgical revascularization. Patient evaluated by Dr. Kipp Brood earlier this morning -final recommendations pending.  Requesting echocardiogram to reevaluate left atrial size to see if Maze procedure would be indicated in the severity of mitral regurgitation. Will order echocardiogram for further evaluation. Patient has also been evaluated by advanced heart failure specialist Dr. Pierre Bali given the complexity of the case and to optimize the patient before surgery.  Their recommendations greatly appreciated and reviewed. Current medications reconciled.  Chronic heart failure with reduced EF: Compensated Appears to be euvolemic on physical examination. BNP is elevated however, patient is also on Entresto. Currently on Entresto, Farxiga, metoprolol, Imdur. Continue telemetry Monitor closely  Persistent atrial fibrillation: Rate control: Metoprolol. Rhythm control: IV amiodarone. Thromboembolic prophylaxis: IV heparin (Eliquis as outpatient) CHA2DS2-VASc SCORE is 5 which correlates to 7.2 % risk of stroke per year (age, CHF, hypertension, diabetes).  Mitral regurgitation/tricuspid regurgitation: Most likely functional. CT  surgery requesting an echocardiogram to reevaluate valve disease prior to upcoming surgery.  Type 2 diabetes mellitus: Hemoglobin A1c 8.1. Currently on Arni, statin therapy, Wilder Glade Currently on sliding scale insulin  Patient's questions and concerns were addressed to his satisfaction. He voices understanding of the instructions provided during this encounter.   This note was created using a voice recognition software as a  result there may be grammatical errors inadvertently enclosed that do not reflect the nature of this encounter. Every attempt is made to correct such errors.  Rex Kras, DO, [facility]. [place] Office: [phone] 02/24/2021, 6:21 PM

## 2021-02-24 NOTE — Progress Notes (Signed)
[place] for Heparin (Apixaban on hold) Indication: chest pain/ACS and atrial fibrillation  No Known Allergies  Patient Measurements: Height: 6\' 6"  (198.1 cm) Weight: 92.8 kg (204 lb 9.6 oz) IBW/kg (Calculated) : 91.4   Vital Signs: Temp: 97.7 F (36.5 C) (10/11 0450) Temp Source: Oral (10/11 0450) BP: 148/92 (10/11 0826) Pulse Rate: 95 (10/11 0826)  Labs: Recent Labs    02/21/21 1347 02/21/21 1545 02/22/21 0131 02/22/21 0847 02/22/21 1807 02/23/21 0300 02/23/21 0811 02/23/21 0812 02/24/21 0057 02/24/21 1053  HGB 16.5  --  15.9  --   --  16.4 17.0 16.7 16.1  --   HCT 49.6  --  47.0  --   --  48.4 50.0 49.0 47.5  --   PLT 312  --  299  --   --  257  --   --  299  --   APTT  --   --   --    < > 110*  --   --   --  62* 77*  HEPARINUNFRC  --   --   --    < > 1.02* 0.75*  --   --  0.39 0.49  CREATININE 0.93  --  1.19  --   --  0.76  --   --  0.88  --   TROPONINIHS 11 11  --   --   --   --   --   --   --   --    < > = values in this interval not displayed.     Estimated Creatinine Clearance: 93.8 mL/min (by C-G formula based on SCr of 0.88 mg/dL).   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 Anticipate using aPTT to dose for now.  Likely CABG on 10/13 -aPTT at goal on 1600 units/hr  Goal of Therapy:  Heparin level 0.3-0.7 units/ml aPTT 66-102 seconds Monitor  platelets by anticoagulation protocol: Yes   Plan:  Continue heparin at 1600 units/ Daily heparin level and CBC  [name], PharmD Clinical Pharmacist **Pharmacist phone directory can now be found on [email or web address] ([email or web address]).  Listed under [place].

## 2021-02-25 ENCOUNTER — Inpatient Hospital Stay (HOSPITAL_COMMUNITY): Payer: Medicare PPO

## 2021-02-25 DIAGNOSIS — I255 Ischemic cardiomyopathy: Secondary | ICD-10-CM

## 2021-02-25 LAB — BASIC METABOLIC PANEL
Anion gap: 8 (ref 5–15)
BUN: 15 mg/dL (ref 8–23)
CO2: 23 mmol/L (ref 22–32)
Calcium: 9.7 mg/dL (ref 8.9–10.3)
Chloride: 102 mmol/L (ref 98–111)
Creatinine, Ser: 0.95 mg/dL (ref 0.61–1.24)
GFR, Estimated: >60 mL/min (ref >60)
Glucose, Bld: 197 mg/dL — ABNORMAL HIGH (ref 70–99)
Potassium: 4.2 mmol/L (ref 3.5–5.1)
Sodium: 133 mmol/L — ABNORMAL LOW (ref 135–145)

## 2021-02-25 LAB — ECHOCARDIOGRAM COMPLETE
AR max vel: 1.8 cm2
AV Area VTI: 1.67 cm2
AV Area mean vel: 1.76 cm2
AV Mean grad: 7 mmHg
AV Peak grad: 11.7 mmHg
Ao pk vel: 1.71 m/s
Area-P 1/2: 2.63 cm2
Height: 78 in
MV VTI: 2.68 cm2
S' Lateral: 3.6 cm
Single Plane A4C EF: 30.8 %
Weight: 3297.6 oz

## 2021-02-25 LAB — CBC
HCT: 48.2 % (ref 39.0–52.0)
Hemoglobin: 16.1 g/dL (ref 13.0–17.0)
MCH: 30.1 pg (ref 26.0–34.0)
MCHC: 33.4 g/dL (ref 30.0–36.0)
MCV: 90.3 fL (ref 80.0–100.0)
Platelets: 228 10*3/uL (ref 150–400)
RBC: 5.34 MIL/uL (ref 4.22–5.81)
RDW: 15.3 % (ref 11.5–15.5)
WBC: 7.7 10*3/uL (ref 4.0–10.5)
nRBC: 0 % (ref 0.0–0.2)

## 2021-02-25 LAB — PREPARE RBC (CROSSMATCH)

## 2021-02-25 LAB — ABO/RH: ABO/RH(D): O POS

## 2021-02-25 LAB — GLUCOSE, CAPILLARY
Glucose-Capillary: 183 mg/dL — ABNORMAL HIGH (ref 70–99)
Glucose-Capillary: 274 mg/dL — ABNORMAL HIGH (ref 70–99)
Glucose-Capillary: 241 mg/dL — ABNORMAL HIGH (ref 70–99)
Glucose-Capillary: 226 mg/dL — ABNORMAL HIGH (ref 70–99)

## 2021-02-25 LAB — APTT: aPTT: 116 seconds — ABNORMAL HIGH (ref 24–36)

## 2021-02-25 LAB — MAGNESIUM: Magnesium: 2.2 mg/dL (ref 1.7–2.4)

## 2021-02-25 LAB — HEPARIN LEVEL (UNFRACTIONATED): Heparin Unfractionated: 0.85 IU/mL — ABNORMAL HIGH (ref 0.30–0.70)

## 2021-02-25 MED ORDER — VANCOMYCIN HCL 1500 MG/300ML IV SOLN
1500.0000 mg | INTRAVENOUS | Status: AC
  Administered 2021-02-26: 1500 mg via INTRAVENOUS
  Filled 2021-02-25: qty 300

## 2021-02-25 MED ORDER — INSULIN REGULAR(HUMAN) IN NACL 100-0.9 UT/100ML-% IV SOLN
INTRAVENOUS | Status: AC
  Administered 2021-02-26: 5 [IU]/h via INTRAVENOUS
  Filled 2021-02-25: qty 100

## 2021-02-25 MED ORDER — EPINEPHRINE HCL 5 MG/250ML IV SOLN IN NS
0.0000 ug/min | INTRAVENOUS | Status: DC
  Filled 2021-02-25: qty 250

## 2021-02-25 MED ORDER — NOREPINEPHRINE 4 MG/250ML-% IV SOLN
0.0000 ug/min | INTRAVENOUS | Status: DC
  Filled 2021-02-25: qty 250

## 2021-02-25 MED ORDER — CHLORHEXIDINE GLUCONATE CLOTH 2 % EX PADS
6.0000 | MEDICATED_PAD | Freq: Every day | CUTANEOUS | Status: DC
  Administered 2021-02-25: 6 via TOPICAL

## 2021-02-25 MED ORDER — TEMAZEPAM 15 MG PO CAPS
15.0000 mg | ORAL_CAPSULE | Freq: Once | ORAL | Status: AC | PRN
  Administered 2021-02-25: 15 mg via ORAL
  Filled 2021-02-25: qty 1

## 2021-02-25 MED ORDER — CEFAZOLIN SODIUM-DEXTROSE 2-4 GM/100ML-% IV SOLN
2.0000 g | INTRAVENOUS | Status: AC
  Administered 2021-02-26 (×2): 2 g via INTRAVENOUS
  Filled 2021-02-25: qty 100

## 2021-02-25 MED ORDER — BISACODYL 5 MG PO TBEC
5.0000 mg | DELAYED_RELEASE_TABLET | Freq: Once | ORAL | Status: AC
  Administered 2021-02-25: 5 mg via ORAL
  Filled 2021-02-25: qty 1

## 2021-02-25 MED ORDER — CHLORHEXIDINE GLUCONATE CLOTH 2 % EX PADS: 6.0000 | MEDICATED_PAD | Freq: Once | CUTANEOUS | Status: DC

## 2021-02-25 MED ORDER — DEXMEDETOMIDINE HCL IN NACL 400 MCG/100ML IV SOLN
0.1000 ug/kg/h | INTRAVENOUS | Status: AC
  Administered 2021-02-26: .4 ug/kg/h via INTRAVENOUS
  Filled 2021-02-25: qty 100

## 2021-02-25 MED ORDER — MUPIROCIN 2 % EX OINT
1.0000 "application " | TOPICAL_OINTMENT | Freq: Two times a day (BID) | CUTANEOUS | Status: AC
  Administered 2021-02-25 – 2021-03-01 (×9): 1 via NASAL
  Filled 2021-02-25 (×4): qty 22

## 2021-02-25 MED ORDER — POTASSIUM CHLORIDE 2 MEQ/ML IV SOLN
80.0000 meq | INTRAVENOUS | Status: DC
  Filled 2021-02-25: qty 40

## 2021-02-25 MED ORDER — HEPARIN 30,000 UNITS/1000 ML (OHS) CELLSAVER SOLUTION
Status: DC
  Filled 2021-02-25: qty 1000

## 2021-02-25 MED ORDER — MAGNESIUM SULFATE 50 % IJ SOLN
40.0000 meq | INTRAMUSCULAR | Status: DC
  Filled 2021-02-25: qty 9.85

## 2021-02-25 MED ORDER — PHENYLEPHRINE HCL-NACL 20-0.9 MG/250ML-% IV SOLN
30.0000 ug/min | INTRAVENOUS | Status: AC
  Administered 2021-02-26: 20 ug/min via INTRAVENOUS
  Filled 2021-02-25: qty 250

## 2021-02-25 MED ORDER — NITROGLYCERIN IN D5W 200-5 MCG/ML-% IV SOLN
2.0000 ug/min | INTRAVENOUS | Status: DC
  Filled 2021-02-25: qty 250

## 2021-02-25 MED ORDER — CEFAZOLIN SODIUM-DEXTROSE 2-4 GM/100ML-% IV SOLN
2.0000 g | INTRAVENOUS | Status: DC
  Filled 2021-02-25: qty 100

## 2021-02-25 MED ORDER — MILRINONE LACTATE IN DEXTROSE 20-5 MG/100ML-% IV SOLN
0.3000 ug/kg/min | INTRAVENOUS | Status: DC
  Filled 2021-02-25: qty 100

## 2021-02-25 MED ORDER — CHLORHEXIDINE GLUCONATE CLOTH 2 % EX PADS
6.0000 | MEDICATED_PAD | Freq: Once | CUTANEOUS | Status: AC
  Administered 2021-02-25: 6 via TOPICAL

## 2021-02-25 MED ORDER — CHLORHEXIDINE GLUCONATE 0.12 % MT SOLN
15.0000 mL | Freq: Once | OROMUCOSAL | Status: AC
  Administered 2021-02-26: 15 mL via OROMUCOSAL
  Filled 2021-02-25: qty 15

## 2021-02-25 MED ORDER — MANNITOL 20 % IV SOLN
INTRAVENOUS | Status: DC
  Filled 2021-02-25: qty 13

## 2021-02-25 MED ORDER — METOPROLOL TARTRATE 12.5 MG HALF TABLET
12.5000 mg | ORAL_TABLET | Freq: Once | ORAL | Status: AC
  Administered 2021-02-26: 12.5 mg via ORAL
  Filled 2021-02-25: qty 1

## 2021-02-25 MED ORDER — TRANEXAMIC ACID (OHS) BOLUS VIA INFUSION
15.0000 mg/kg | INTRAVENOUS | Status: AC
  Administered 2021-02-26: 1402.5 mg via INTRAVENOUS
  Filled 2021-02-25: qty 1403

## 2021-02-25 MED ORDER — PLASMA-LYTE A IV SOLN
INTRAVENOUS | Status: DC
  Filled 2021-02-25: qty 5

## 2021-02-25 MED ORDER — TRANEXAMIC ACID 1000 MG/10ML IV SOLN
1.5000 mg/kg/h | INTRAVENOUS | Status: AC
  Administered 2021-02-26: 1.5 mg/kg/h via INTRAVENOUS
  Filled 2021-02-25: qty 25

## 2021-02-25 MED ORDER — TRANEXAMIC ACID (OHS) PUMP PRIME SOLUTION
2.0000 mg/kg | INTRAVENOUS | Status: DC
  Filled 2021-02-25: qty 1.87

## 2021-02-25 NOTE — Progress Notes (Signed)
Inpatient Diabetes Program Recommendations  AACE/ADA: New Consensus Statement on Inpatient Glycemic Control (2015)  Target Ranges:  Prepandial:   less than 140 mg/dL      Peak postprandial:   less than 180 mg/dL (1-2 hours)      Critically ill patients:  140 - 180 mg/dL   Lab Results  Component Value Date   GLUCAP 183 (H) 02/25/2021   HGBA1C 8.1 (H) 02/22/2021    Review of Glycemic Control Results for Derek Bond, Derek Bond (MRN [id number]) as of 02/25/2021 09:47  Ref. Range 02/24/2021 07:47 02/24/2021 11:30 02/24/2021 16:59 02/24/2021 20:47 02/25/2021 07:54  Glucose-Capillary Latest Ref Range: 70 - 99 mg/dL 164 (H) 299 (H) 225 (H) 265 (H) 183 (H)   Diabetes history: DM2 Outpatient Diabetes medications: Tresiba 48 units daily, Farxiga 5 mg daily, Glipizide XL 10 mg BID, Metformin 1000 mg BID Current orders for Inpatient glycemic control: Novolog 0-9 units TID with meals, Novolog 0-5 units QHS, Farxiga 10 mg QAM (IV insulin to start in am)   Inpatient Diabetes Program Recommendations:    Postprandial CBGs continue to be elevated. Noted IV insulin to start in am for preop surgery.  Insulin: Please consider ordering Novolog 4 units TID with meals for meal coverage if patient eats at least 50% of meals.  Thank you, Nani Gasser. Arriyana Rodell, RN, MSN, CDE  Diabetes Coordinator Inpatient Glycemic Control Team Team Pager [phone] (8am-5pm) 02/25/2021 9:48 AM

## 2021-02-25 NOTE — Progress Notes (Signed)
Heart Failure Navigator Progress Note  Assessed for Heart & Vascular TOC clinic readiness. Plan for potential CABG, pt has quick follow up appt with [facility] 10/28.   Navigator available for reassessment of patient.   Pricilla Holm, MSN, RN Heart Failure Nurse Navigator [phone]

## 2021-02-25 NOTE — Progress Notes (Signed)
Progress Note  Patient Name: Derek Bond Date of Encounter: 02/25/2021  Attending physician: Rex Kras, DO Primary care provider: Glenda Chroman, MD Primary Cardiologist: Rex Kras, DO, [facility]  Subjective: Derek Bond is a 75 y.o. male who was seen and examined at bedside. Daughter present at bedside. Denies chest pain. No shortness of breath, orthopnea, paroxysmal nocturnal dyspnea or lower extremity swelling. Rate controlled A. fib.  Objective: Vital Signs in the last 24 hours: Temp:  [97.7 F (36.5 C)-98.7 F (37.1 C)] 98.4 F (36.9 C) (10/12 1506) Pulse Rate:  [87-96] 88 (10/12 1506) Resp:  [17-21] 17 (10/12 1506) BP: (105-142)/(68-90) 105/68 (10/12 1506) SpO2:  [95 %-96 %] 95 % (10/12 1506) Weight:  [93.5 kg] 93.5 kg (10/12 0602)  Intake/Output:  Intake/Output Summary (Last 24 hours) at 02/25/2021 1530 Last data filed at 02/25/2021 1509 Gross per 24 hour  Intake --  Output 1000 ml  Net -1000 ml    Net IO Since Admission: -137.84 mL [02/25/21 1530]  Weights:  Filed Weights   02/23/21 0535 02/24/21 0450 02/25/21 0602  Weight: 93.8 kg 92.8 kg 93.5 kg    Telemetry: Personally reviewed.  Atrial fibrillation, rate controlled  Physical examination: PHYSICAL EXAM: Vitals with BMI 02/25/2021 02/25/2021 02/24/2021  Height - - -  Weight - 206 lbs 2 oz -  BMI - 43.15 -  Systolic 400 867 -  Diastolic 68 78 -  Pulse 88 96 87    CONSTITUTIONAL: Well-developed and well-nourished. No acute distress.  SKIN: Skin is warm and dry. No rash noted. No cyanosis. No pallor. No jaundice HEAD: Normocephalic and atraumatic.  EYES: No scleral icterus MOUTH/THROAT: Moist oral membranes.  NECK: No JVD present. No thyromegaly noted. No carotid bruits  LYMPHATIC: No visible cervical adenopathy.  CHEST Normal respiratory effort. No intercostal retractions  LUNGS: Clear to auscultation bilaterally.  No stridor. No wheezes. No rales.  CARDIOVASCULAR: Irregularly  irregular, variable S1-S2, no murmurs rubs or gallops appreciated. ABDOMINAL: Soft, nontender, nondistended, positive bowel sounds in all 4 quadrants no apparent ascites.  EXTREMITIES: No peripheral edema, warm to touch, 2+ DP and PT pulses bilaterally. HEMATOLOGIC: No significant bruising NEUROLOGIC: Oriented to person, place, and time. Nonfocal. Normal muscle tone.  PSYCHIATRIC: Normal mood and affect. Normal behavior. Cooperative  Lab Results: Hematology Recent Labs  Lab 02/23/21 0300 02/23/21 0811 02/23/21 0812 02/24/21 0057 02/25/21 0237  WBC 7.0  --   --  7.4 7.7  RBC 5.39  --   --  5.32 5.34  HGB 16.4   < > 16.7 16.1 16.1  HCT 48.4   < > 49.0 47.5 48.2  MCV 89.8  --   --  89.3 90.3  MCH 30.4  --   --  30.3 30.1  MCHC 33.9  --   --  33.9 33.4  RDW 15.1  --   --  15.3 15.3  PLT 257  --   --  299 228   < > = values in this interval not displayed.    Chemistry Recent Labs  Lab 02/23/21 0300 02/23/21 0811 02/23/21 0812 02/24/21 0057 02/25/21 0237  NA 136   < > 139 138 133*  K 3.9   < > 3.9 4.1 4.2  CL 104  --   --  104 102  CO2 23  --   --  24 23  GLUCOSE 127*  --   --  124* 197*  BUN 14  --   --  13 15  CREATININE 0.76  --   --  0.88 0.95  CALCIUM 9.7  --   --  9.8 9.7  GFRNONAA >60  --   --  >60 >60  ANIONGAP 9  --   --  10 8   < > = values in this interval not displayed.     Cardiac Enzymes: Cardiac Panel (last 3 results) No results for input(s): CKTOTAL, CKMB, TROPONINIHS, RELINDX in the last 72 hours.  BNP (last 3 results) Recent Labs    01/26/21 1443 02/21/21 1347  BNP 542.0* 487.0*    ProBNP (last 3 results) Recent Labs    02/05/21 1441 02/18/21 1534  PROBNP 1,567* 2,355*     DDimer No results for input(s): DDIMER in the last 168 hours.   Hemoglobin A1c:  Lab Results  Component Value Date   HGBA1C 8.1 (H) 02/22/2021   MPG 185.77 02/22/2021    TSH  Recent Labs    02/21/21 1347 02/22/21 0131  TSH 1.615 2.348    Lipid Panel      Component Value Date/Time   CHOL 99 02/22/2021 0131   TRIG 56 02/22/2021 0131   HDL 44 02/22/2021 0131   CHOLHDL 2.3 02/22/2021 0131   VLDL 11 02/22/2021 0131   LDLCALC 44 02/22/2021 0131    Imaging: ECHOCARDIOGRAM COMPLETE  Result Date: 02/25/2021    ECHOCARDIOGRAM REPORT   Patient Name:   Derek Bond Date of Exam: 02/25/2021 Medical Rec #:  [id number]       Height:       78.0 in Accession #:    [id number]      Weight:       206.1 lb Date of Birth:  [birth year]        BSA:          2.287 m Patient Age:    16 years        BP:           125/78 mmHg Patient Gender: M               HR:           99 bpm. Exam Location:  Inpatient Procedure: 2D Echo, Cardiac Doppler and Color Doppler Indications:    CAD  History:        Patient has prior history of Echocardiogram examinations, most                 recent 02/02/2016. CHF, Arrythmias:Atrial Fibrillation; Risk                 Factors:Hypertension and Dyslipidemia.  Sonographer:    Wenda Low Referring Phys: [id number] Jilene Spohr IMPRESSIONS  1. Left ventricular ejection fraction, by estimation, is 30 to 35%. The left ventricle has moderately decreased function. The left ventricle demonstrates global hypokinesis. There is moderate left ventricular hypertrophy. Left ventricular diastolic function could not be evaluated.  2. Right ventricular systolic function is normal. The right ventricular size is mildly enlarged. There is normal pulmonary artery systolic pressure.  3. Left atrial size was severely dilated.  4. Right atrial size was dilated.  5. The mitral valve is normal in structure. Mild mitral valve regurgitation. No evidence of mitral stenosis.  6. The aortic valve is tricuspid. Aortic valve regurgitation is not visualized.  7. There is mild dilatation of the ascending aorta, measuring 40 mm. FINDINGS  Left Ventricle: Left ventricular ejection fraction, by estimation, is 30 to 35%. The left ventricle has moderately decreased function. The left  ventricle demonstrates global hypokinesis. Definity contrast agent was given IV to delineate the left ventricular endocardial borders. The left ventricular internal cavity size was normal in size. There is moderate left ventricular hypertrophy. Left ventricular diastolic function could not be evaluated due to atrial fibrillation. Left ventricular diastolic function could not be evaluated. Right Ventricle: The right ventricular size is mildly enlarged. No increase in right ventricular wall thickness. Right ventricular systolic function is normal. There is normal pulmonary artery systolic pressure. The tricuspid regurgitant velocity is 2.55  m/s, and with an assumed right atrial pressure of 8 mmHg, the estimated right ventricular systolic pressure is 38.9 mmHg. Left Atrium: Left atrial size was severely dilated. Right Atrium: Right atrial size was dilated. Pericardium: There is no evidence of pericardial effusion. Mitral Valve: The mitral valve is normal in structure. Mild mitral valve regurgitation, with centrally-directed jet. No evidence of mitral valve stenosis. MV peak gradient, 3.2 mmHg. The mean mitral valve gradient is 1.0 mmHg. Tricuspid Valve: The tricuspid valve is normal in structure. Tricuspid valve regurgitation is mild . No evidence of tricuspid stenosis. Aortic Valve: The aortic valve is tricuspid. Aortic valve regurgitation is not visualized. Aortic valve mean gradient measures 7.0 mmHg. Aortic valve peak gradient measures 11.7 mmHg. Aortic valve area, by VTI measures 1.67 cm. Pulmonic Valve: The pulmonic valve was normal in structure. Pulmonic valve regurgitation is trivial. No evidence of pulmonic stenosis. Aorta: The aortic root is normal in size and structure. There is mild dilatation of the ascending aorta, measuring 40 mm. Venous: The inferior vena cava was not well visualized. IAS/Shunts: The atrial septum is grossly normal.  LEFT VENTRICLE PLAX 2D LVIDd:         4.60 cm      Diastology LVIDs:          3.60 cm      LV e' medial:    4.33 cm/s LV PW:         1.50 cm      LV E/e' medial:  15.3 LV IVS:        1.60 cm      LV e' lateral:   7.49 cm/s LVOT diam:     2.20 cm      LV E/e' lateral: 8.9 LV SV:         57 LV SV Index:   25 LVOT Area:     3.80 cm  LV Volumes (MOD) LV vol d, MOD A4C: 146.0 ml LV vol s, MOD A4C: 101.0 ml LV SV MOD A4C:     146.0 ml RIGHT VENTRICLE RV Basal diam:  4.90 cm RV Mid diam:    4.80 cm RV S prime:     10.10 cm/s TAPSE (M-mode): 2.6 cm LEFT ATRIUM              Index        RIGHT ATRIUM           Index LA diam:        4.20 cm  1.84 cm/m   RA Area:     31.20 cm LA Vol (A2C):   123.0 ml 53.79 ml/m  RA Volume:   122.00 ml 53.36 ml/m LA Vol (A4C):   105.0 ml 45.92 ml/m LA Biplane Vol: 119.0 ml 52.04 ml/m  AORTIC VALVE                     PULMONIC VALVE AV Area (Vmax):    1.80 cm  PV Vmax:       0.66 m/s AV Area (Vmean):   1.76 cm      PV Peak grad:  1.7 mmHg AV Area (VTI):     1.67 cm AV Vmax:           171.00 cm/s AV Vmean:          124.000 cm/s AV VTI:            0.344 m AV Peak Grad:      11.7 mmHg AV Mean Grad:      7.0 mmHg LVOT Vmax:         81.10 cm/s LVOT Vmean:        57.300 cm/s LVOT VTI:          0.151 m LVOT/AV VTI ratio: 0.44  AORTA Ao Root diam: 3.70 cm Ao Asc diam:  4.00 cm MITRAL VALVE               TRICUSPID VALVE MV Area (PHT): 2.63 cm    TR Peak grad:   26.0 mmHg MV Area VTI:   2.68 cm    TR Vmax:        255.00 cm/s MV Peak grad:  3.2 mmHg MV Mean grad:  1.0 mmHg    SHUNTS MV Vmax:       0.89 m/s    Systemic VTI:  0.15 m MV Vmean:      50.1 cm/s   Systemic Diam: 2.20 cm MV Decel Time: 288 msec MV E velocity: 66.40 cm/s Christopherjame Carnell DO Electronically signed by Rex Kras DO Signature Date/Time: 02/25/2021/3:07:07 PM    Final    VAS US DOPPLER PRE CABG  Result Date: 02/24/2021 PREOPERATIVE VASCULAR EVALUATION Patient Name:  Derek Bond  Date of Exam:   02/24/2021 Medical Rec #: [id number]        Accession #:    [id number] Date of Birth:  Jul 01, 1945         Patient Gender: M Patient Age:   24 years Exam Location:  [facility] Procedure:      VAS US DOPPLER PRE CABG Referring Phys: HARRELL LIGHTFOOT --------------------------------------------------------------------------------  Indications:      Pre-CABG. Risk Factors:     Hypertension, Diabetes, coronary artery disease. Comparison Study: No prior study Performing Technologist: Maudry Mayhew MHA, RVT, RDCS, RDMS  Examination Guidelines: A complete evaluation includes B-mode imaging, spectral Doppler, color Doppler, and power Doppler as needed of all accessible portions of each vessel. Bilateral testing is considered an integral part of a complete examination. Limited examinations for reoccurring indications may be performed as noted.  Right Carotid Findings: +----------+--------+--------+--------+-----------------------+--------+           PSV cm/sEDV cm/sStenosisDescribe               Comments +----------+--------+--------+--------+-----------------------+--------+ CCA Prox  52      11              smooth and heterogenous         +----------+--------+--------+--------+-----------------------+--------+ CCA Distal74      10              heterogenous and smooth         +----------+--------+--------+--------+-----------------------+--------+ ICA Prox  57      13              irregular and calcific          +----------+--------+--------+--------+-----------------------+--------+ ICA Distal83      14                                              +----------+--------+--------+--------+-----------------------+--------+  ECA       80                                                      +----------+--------+--------+--------+-----------------------+--------+ +----------+--------+-------+----------------+------------+           PSV cm/sEDV cmsDescribe        Arm Pressure +----------+--------+-------+----------------+------------+ [id number]              Multiphasic, WNL             +----------+--------+-------+----------------+------------+ +---------+--------+--+--------+-+---------+ VertebralPSV cm/s31EDV cm/s6Antegrade +---------+--------+--+--------+-+---------+ Left Carotid Findings: +----------+--------+--------+--------+-------------------------+--------+           PSV cm/sEDV cm/sStenosisDescribe                 Comments +----------+--------+--------+--------+-------------------------+--------+ CCA Prox  86      8                                                 +----------+--------+--------+--------+-------------------------+--------+ CCA Distal54      15              smooth and heterogenous           +----------+--------+--------+--------+-------------------------+--------+ ICA Prox  79      11              heterogenous and calcific         +----------+--------+--------+--------+-------------------------+--------+ ICA Distal65      25                                                +----------+--------+--------+--------+-------------------------+--------+ ECA       116                     smooth and heterogenous           +----------+--------+--------+--------+-------------------------+--------+  +----------+--------+--------+----------------+------------+ SubclavianPSV cm/sEDV cm/sDescribe        Arm Pressure +----------+--------+--------+----------------+------------+           132             Multiphasic, WNL             +----------+--------+--------+----------------+------------+ +---------+--------+--+--------+--+---------+ VertebralPSV cm/s42EDV cm/s13Antegrade +---------+--------+--+--------+--+---------+  ABI Findings: +---------+------------------+-----+----------+--------+ Right    Rt Pressure (mmHg)IndexWaveform  Comment  +---------+------------------+-----+----------+--------+ Brachial 130                    triphasic           +---------+------------------+-----+----------+--------+ PTA      131               1.01 biphasic           +---------+------------------+-----+----------+--------+ DP       104               0.80 monophasic         +---------+------------------+-----+----------+--------+ Great Toe146               1.12                    +---------+------------------+-----+----------+--------+ +---------+------------------+-----+---------+-------+ Left     Lt Pressure (  mmHg)IndexWaveform Comment +---------+------------------+-----+---------+-------+ Brachial 124                    triphasic        +---------+------------------+-----+---------+-------+ PTA      118               0.91 biphasic         +---------+------------------+-----+---------+-------+ DP       112               0.86 biphasic         +---------+------------------+-----+---------+-------+ Great Toe61                0.47                  +---------+------------------+-----+---------+-------+ +-------+---------------+----------------+ ABI/TBIToday's ABI/TBIPrevious ABI/TBI +-------+---------------+----------------+ Right  1.01/1.12                       +-------+---------------+----------------+ Left   0.91/0.47                       +-------+---------------+----------------+  Right Doppler Findings: +-----------+--------+-----+---------+----------------------------------------+ Site       PressureIndexDoppler  Comments                                 +-----------+--------+-----+---------+----------------------------------------+ Brachial   130          triphasic                                         +-----------+--------+-----+---------+----------------------------------------+ Radial                  triphasic                                         +-----------+--------+-----+---------+----------------------------------------+ Ulnar                   triphasic                                          +-----------+--------+-----+---------+----------------------------------------+ Palmar Arch                      Unable to adequately assess due to                                        arrhythmia                               +-----------+--------+-----+---------+----------------------------------------+  Left Doppler Findings: +--------+--------+-----+---------+-------------------------------------------+ Site    PressureIndexDoppler  Comments                                    +--------+--------+-----+---------+-------------------------------------------+ [id number]          triphasic                                            +--------+--------+-----+---------+-------------------------------------------+  Radial               triphasic                                            +--------+--------+-----+---------+-------------------------------------------+ Ulnar                triphasic                                            +--------+--------+-----+---------+-------------------------------------------+ Digit                         Unable to adequately assess due to                                        arrhythmia                                  +--------+--------+-----+---------+-------------------------------------------+  Summary: Right Carotid: Velocities in the right ICA are consistent with a 1-39% stenosis. Left Carotid: Velocities in the left ICA are consistent with a 1-39% stenosis. Vertebrals:  Bilateral vertebral arteries demonstrate antegrade flow. Subclavians: Normal flow hemodynamics were seen in bilateral subclavian              arteries. Right ABI: Resting right ankle-brachial index is within normal range. No evidence of significant right lower extremity arterial disease. The right toe-brachial index is normal. 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 The left  toe-brachial index is abnormal.    Preliminary     Cardiac database: EKG: 01/26/2021: Atrial fibrillation with rapid ventricular rate 126 bpm, left axis deviation, left anterior fascicular block, left bundle branch block, consider old inferior infarct. 02/11/2021: Atrial fibrillation, 105 bpm, left axis, left bundle branch block.  02/22/2021: Atrial fibrillation with controlled ventricular response at a rate of 85 bpm.  Left axis.  Left bundle branch block.  Echocardiogram: 02/05/2021:  Severely depressed LV systolic function with visual EF 25-30%. Hypokinetic global wall motion with regionality involving septal wall. Left ventricle cavity is mildly dilated. Moderate left ventricular hypertrophy. Unable to evaluate diastolic function due to atrial fibrillation. Elevated LAP.  Moderate (Grade II) mitral regurgitation.  Moderate tricuspid regurgitation. Mild pulmonary hypertension, RVSP 4mmHg.  IVC is dilated with a respiratory response of <50%.  No prior study for comparison.  02/25/2021:  1. Left ventricular ejection fraction, by estimation, is 30 to 35%. The left ventricle has moderately decreased function. The left ventricle demonstrates global hypokinesis. There is moderate left ventricular hypertrophy. Left ventricular diastolic function could not be evaluated.   2. Right ventricular systolic function is normal. The right ventricular  size is mildly enlarged. There is normal pulmonary artery systolic pressure.   3. Left atrial size was severely dilated.   4. Right atrial size was dilated.   5. The mitral valve is normal in structure. Mild mitral valve regurgitation. No evidence of mitral stenosis.   6. The aortic valve is tricuspid. Aortic valve regurgitation is not visualized.   7. There is mild dilatation of the ascending aorta, measuring 40  mm.   Stress test: MPI 02/12/2016: No diagnostic ST segment changes to indicate ischemia. No significant arrhythmias. Small, mild intensity,  partially reversible mid to basal inferior defect suggestive of variable soft tissue attenuation. No large ischemic zones are noted. This is a low risk study. Nuclear stress EF: 55%.  Heart catheterization: Left and right heart catheterization 02/23/2021 LM: Normal LAD: Prox 80% stenosius at bifurcation with small to medium caliber D1 with 75% long prox disease. Mid 70% disease Ramus: Minimal luminal irregularities Lcx: OM1 with prox 80% stenosis RCA: 60-70% disease in PL branches   RA: 2 mmHg RV: 22/0 mmHg PA: 21/5 mmHg, mPAP 13 mmHg PCW: 8 mmHg   CO: 5.6 L/min CI: 2.5 L/min/m2     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%, diabetic patient CVTS consult for CABG  Scheduled Meds:  aspirin EC  81 mg Oral Daily   Chlorhexidine Gluconate Cloth  6 each Topical Daily   dapagliflozin propanediol  10 mg Oral QAC breakfast   [START ON 02/26/2021] epinephrine  0-10 mcg/min Intravenous To OR   famotidine  40 mg Oral Daily   feeding supplement  237 mL Oral BID BM   [START ON 02/26/2021] heparin-papaverine-plasmalyte irrigation   Irrigation To OR   influenza vaccine adjuvanted  0.5 mL Intramuscular Tomorrow-1000   insulin aspart  0-5 Units Subcutaneous QHS   insulin aspart  0-9 Units Subcutaneous TID WC   [START ON 02/26/2021] insulin   Intravenous To OR   isosorbide mononitrate  20 mg Oral Daily   [START ON 02/26/2021] [place] Blood Cardioplegia vial (lidocaine/magnesium/mannitol 0.26g-4g-6.4g)   Intracoronary To OR   magnesium oxide  400 mg Oral TID   metoprolol tartrate  25 mg Oral BID   mupirocin ointment  1 application Nasal BID   pantoprazole  40 mg Oral Daily   [START ON 02/26/2021] phenylephrine  30-200 mcg/min Intravenous To OR   [START ON 02/26/2021] potassium chloride  80 mEq Other To OR   rosuvastatin  5 mg Oral QHS   sacubitril-valsartan  1 tablet Oral BID   sodium chloride flush  3 mL Intravenous Q12H   sodium chloride flush  3 mL Intravenous Q12H   [START ON  02/26/2021] tranexamic acid  15 mg/kg Intravenous To OR   [START ON 02/26/2021] tranexamic acid  2 mg/kg Intracatheter To OR    Continuous Infusions:  sodium chloride     amiodarone 30 mg/hr (02/25/21 0900)   [START ON 02/26/2021]  ceFAZolin (ANCEF) IV     [START ON 02/26/2021]  ceFAZolin (ANCEF) IV     [START ON 02/26/2021] dexmedetomidine     [START ON 02/26/2021] heparin 30,000 units/NS 1000 mL solution for CELLSAVER     heparin 1,450 Units/hr (02/25/21 1510)   [START ON 02/26/2021] milrinone     nitroGLYCERIN     [START ON 02/26/2021] nitroGLYCERIN     [START ON 02/26/2021] norepinephrine     [START ON 02/26/2021] tranexamic acid (CYKLOKAPRON) infusion ([facility])     [START ON 02/26/2021] vancomycin      PRN Meds: sodium chloride, acetaminophen, acetaminophen, alum & mag hydroxide-simeth, loperamide, nitroGLYCERIN, ondansetron (ZOFRAN) IV, ondansetron (ZOFRAN) IV, sodium chloride flush   IMPRESSION & RECOMMENDATIONS: Derek Bond is a 75 y.o. 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 Noted to have  two-vessel CAD on recent angiography in the setting of ischemic cardiomyopathy, diabetes mellitus.  CT surgery was consulted for possible surgical revascularization. CT surgery following-appreciate recommendations. Echocardiogram today performed and reviewed findings noted above. Also evaluated by advanced heart failure specialist Dr. Pierre Bali given the complexity of the case and to optimize the patient before surgery.  Their recommendations greatly appreciated and reviewed. Current medications reconciled. Remains on IV heparin drip.  Currently not on nitro drip. Tentatively  scheduled for surgery tomorrow.  Chronic heart failure with reduced EF: Compensated Appears to be euvolemic on physical examination. BNP is elevated however, patient is also on Entresto. Currently on Entresto, Farxiga, metoprolol, Imdur. Continue telemetry Monitor closely  Persistent atrial fibrillation: Rate control: Metoprolol. Rhythm control: IV amiodarone. Thromboembolic prophylaxis: IV heparin (Eliquis as outpatient) CHA2DS2-VASc SCORE is 5 which correlates to 7.2 % risk of stroke per year (age, CHF, hypertension, diabetes).  Mitral regurgitation/tricuspid regurgitation: Severity of MR and TR have improved since up titration of GDMT. Monitor for now.   Type 2 diabetes mellitus: Hemoglobin A1c 8.1. Currently on Arni, statin therapy, Wilder Glade Currently on sliding scale insulin  Patient's questions and concerns were addressed to his satisfaction. He voices understanding of the instructions provided during this encounter.   This note was created using a voice recognition software as a result there may be grammatical errors inadvertently enclosed that do not reflect the nature of this encounter. Every attempt is made to correct such errors.  Rex Kras, DO, [facility] [place] Office: [phone] 02/25/2021, 3:30 PM

## 2021-02-25 NOTE — Hospital Course (Addendum)
HPI: This is a 75 year old with a past medical history of diabetes mellitus (type 2), hypertension, dyslipidemia, OSA, remote tobacco abuse, and malignant melanoma left side of face who was found to have heart failure in August. He was found to be in a fib on this admission. Work up revealed LVEF 25-30%, moderate MR and TR, and three vessel coronary artery disease. Further evaluation with left heart catheterization was recommended and had already been scheduled. However, Derek Bond presented to the emergency room at [facility] this past weekend with worsening shortness of breath and decreased energy. He reported having intermittent chest tightness.  Work-up in the emergency room yesterday included serial troponins which were flat.  There were no ischemic changes on EKG.  He was transferred to [facility] for further evaluation and management. Dr. Kipp Brood was consulted for the consideration of coronary artery bypass grafting surgery, LA clip, and MAZE. Potential risks, benefits, and complications of the surgery were discussed with the patient and he agreed to proceed with surgery. Pre operative carotid duplex US showed no significant internal carotid artery stenosis bilaterally.  Hospital Course: Patient underwent a CABG x 4, LA clip, MAZE on 02/26/2021. Patient was transferred from the OR to [facility] ICU in stable condition.  Postoperative hospital course:  The patient initially did require some inotropic support as well as Neo-Synephrine for blood pressure.  He was maintained on low-dose dobutamine and on postoperative day #1 was also started on oral midodrine.  Early postoperatively he maintained sinus rhythm on amiodarone drip with plans to transition to oral dosing on postop day 2.  Eliquis will also be resumed.  He is noted to have a minor expected acute blood loss anemia which is stable and is being clinically monitored.  He is not in the transfusion threshold.  Renal function  postoperatively has remained normal.  He has good urine output.  Blood sugars are being managed using standard postoperative cardiac surgical protocols with plans to transition to home medication regimen over time.  He is noted to have somewhat poor diabetic control with a hemoglobin A1c of 8.1 preoperatively.  This will need to be followed closely as an outpatient by primary care.  He is tolerating gradually increasing activities using standard post cardiac surgical protocols.  Incisions are noted to be healing well without evidence of infection.  He is tolerating diet.  Oxygen is being weaned and he is maintaining good saturations.  Dobutamine has been discontinued on postoperative day #4.  He has been started on diuretic for expected postoperative volume overload.

## 2021-02-25 NOTE — Progress Notes (Signed)
[place] for Heparin Indication: chest pain/ACS and atrial fibrillation  No Known Allergies  Patient Measurements: Height: 6\' 6"  (198.1 cm) Weight: 92.8 kg (204 lb 9.6 oz) IBW/kg (Calculated) : 91.4   Vital Signs: Temp: 97.7 F (36.5 C) (10/11 2050) Temp Source: Oral (10/11 2050) BP: 142/90 (10/11 2050) Pulse Rate: 87 (10/11 2100)  Labs: Recent Labs    02/23/21 0300 02/23/21 0811 02/23/21 0812 02/24/21 0057 02/24/21 1053 02/25/21 0237  HGB 16.4   < > 16.7 16.1  --  16.1  HCT 48.4   < > 49.0 47.5  --  48.2  PLT 257  --   --  299  --  228  APTT  --   --   --  62* 77* 116*  HEPARINUNFRC 0.75*  --   --  0.39 0.49 0.85*  CREATININE 0.76  --   --  0.88  --  0.95   < > = values in this interval not displayed.     Estimated Creatinine Clearance: 86.9 mL/min (by C-G formula based on SCr of 0.95 mg/dL).  Assessment: 75 y.o. male with h/o Afib, Eliquis on hold, for heparin.  aPTT supratherapeutic this morning  Goal of Therapy:  Heparin level 0.3-0.7 units/ml aPTT 66-102 seconds Monitor platelets by anticoagulation protocol: Yes   Plan:  Decrease Heparin 1450 units/hr  Phillis Knack, PharmD, BCPS

## 2021-02-25 NOTE — Progress Notes (Signed)
*  PRELIMINARY RESULTS* Echocardiogram 2D Echocardiogram has been performed.  Derek Bond 02/25/2021, 11:45 AM

## 2021-02-25 NOTE — Progress Notes (Signed)
CARDIAC REHAB PHASE I   PRE:  Rate/Rhythm: 90 afib    BP: sitting 128/61    SaO2: 95 RA  MODE:  Ambulation: 400 ft   POST:  Rate/Rhythm: 108 afib    BP: sitting 101/73     SaO2: 95 RA  Ambulated pushing IV pole, slow and steady, no c/o. HR controlled. Return to EOB, encouraged IS. No questions. [place], ACSM 02/25/2021 3:10 PM

## 2021-02-26 ENCOUNTER — Inpatient Hospital Stay (HOSPITAL_COMMUNITY): Payer: Medicare PPO

## 2021-02-26 ENCOUNTER — Encounter (HOSPITAL_COMMUNITY)
Admission: EM | Disposition: A | Discharge status: Home Health | Payer: Self-pay | Source: Home / Self Care | Attending: Thoracic Surgery (Cardiothoracic Vascular Surgery)

## 2021-02-26 DIAGNOSIS — I2510 Atherosclerotic heart disease of native coronary artery without angina pectoris: Secondary | ICD-10-CM

## 2021-02-26 DIAGNOSIS — I482 Chronic atrial fibrillation, unspecified: Secondary | ICD-10-CM

## 2021-02-26 DIAGNOSIS — Z951 Presence of aortocoronary bypass graft: Secondary | ICD-10-CM

## 2021-02-26 DIAGNOSIS — I509 Heart failure, unspecified: Secondary | ICD-10-CM

## 2021-02-26 DIAGNOSIS — E440 Moderate protein-calorie malnutrition: Secondary | ICD-10-CM | POA: Insufficient documentation

## 2021-02-26 HISTORY — PX: CLIPPING OF ATRIAL APPENDAGE: SHX5773

## 2021-02-26 HISTORY — PX: CORONARY ARTERY BYPASS GRAFT: SHX141

## 2021-02-26 HISTORY — PX: ENDOVEIN HARVEST OF GREATER SAPHENOUS VEIN: SHX5059

## 2021-02-26 HISTORY — PX: TEE WITHOUT CARDIOVERSION: SHX5443

## 2021-02-26 HISTORY — PX: MAZE: SHX5063

## 2021-02-26 LAB — POCT I-STAT EG7
Acid-Base Excess: 1 mmol/L (ref 0.0–2.0)
Bicarbonate: 26 mmol/L (ref 20.0–28.0)
Calcium, Ion: 1.13 mmol/L — ABNORMAL LOW (ref 1.15–1.40)
HCT: 35 % — ABNORMAL LOW (ref 39.0–52.0)
Hemoglobin: 11.9 g/dL — ABNORMAL LOW (ref 13.0–17.0)
O2 Saturation: 88 %
Potassium: 3.6 mmol/L (ref 3.5–5.1)
Sodium: 139 mmol/L (ref 135–145)
TCO2: 27 mmol/L (ref 22–32)
pCO2, Ven: 43.1 mmHg — ABNORMAL LOW (ref 44.0–60.0)
pH, Ven: 7.387 (ref 7.250–7.430)
pO2, Ven: 56 mmHg — ABNORMAL HIGH (ref 32.0–45.0)

## 2021-02-26 LAB — COMPREHENSIVE METABOLIC PANEL
ALT: 47 U/L — ABNORMAL HIGH (ref 0–44)
AST: 70 U/L — ABNORMAL HIGH (ref 15–41)
Albumin: 3.2 g/dL — ABNORMAL LOW (ref 3.5–5.0)
Alkaline Phosphatase: 71 U/L (ref 38–126)
Anion gap: 11 (ref 5–15)
BUN: 14 mg/dL (ref 8–23)
CO2: 24 mmol/L (ref 22–32)
Calcium: 9.7 mg/dL (ref 8.9–10.3)
Chloride: 103 mmol/L (ref 98–111)
Creatinine, Ser: 0.85 mg/dL (ref 0.61–1.24)
GFR, Estimated: >60 mL/min (ref >60)
Glucose, Bld: 163 mg/dL — ABNORMAL HIGH (ref 70–99)
Potassium: 3.8 mmol/L (ref 3.5–5.1)
Sodium: 138 mmol/L (ref 135–145)
Total Bilirubin: 1.6 mg/dL — ABNORMAL HIGH (ref 0.3–1.2)
Total Protein: 6.5 g/dL (ref 6.5–8.1)

## 2021-02-26 LAB — GLUCOSE, CAPILLARY
Glucose-Capillary: 168 mg/dL — ABNORMAL HIGH (ref 70–99)
Glucose-Capillary: 117 mg/dL — ABNORMAL HIGH (ref 70–99)
Glucose-Capillary: 83 mg/dL (ref 70–99)
Glucose-Capillary: 97 mg/dL (ref 70–99)
Glucose-Capillary: 91 mg/dL (ref 70–99)
Glucose-Capillary: 96 mg/dL (ref 70–99)
Glucose-Capillary: 98 mg/dL (ref 70–99)
Glucose-Capillary: 101 mg/dL — ABNORMAL HIGH (ref 70–99)
Glucose-Capillary: 126 mg/dL — ABNORMAL HIGH (ref 70–99)
Glucose-Capillary: 109 mg/dL — ABNORMAL HIGH (ref 70–99)
Glucose-Capillary: 124 mg/dL — ABNORMAL HIGH (ref 70–99)

## 2021-02-26 LAB — POCT I-STAT 7, (LYTES, BLD GAS, ICA,H+H)
Acid-Base Excess: 2 mmol/L (ref 0.0–2.0)
Acid-Base Excess: 3 mmol/L — ABNORMAL HIGH (ref 0.0–2.0)
Acid-Base Excess: 3 mmol/L — ABNORMAL HIGH (ref 0.0–2.0)
Acid-Base Excess: 2 mmol/L (ref 0.0–2.0)
Acid-Base Excess: 3 mmol/L — ABNORMAL HIGH (ref 0.0–2.0)
Acid-Base Excess: 2 mmol/L (ref 0.0–2.0)
Acid-Base Excess: 0 mmol/L (ref 0.0–2.0)
Acid-base deficit: 2 mmol/L (ref 0.0–2.0)
Bicarbonate: 24.7 mmol/L (ref 20.0–28.0)
Bicarbonate: 26.7 mmol/L (ref 20.0–28.0)
Bicarbonate: 26.7 mmol/L (ref 20.0–28.0)
Bicarbonate: 27.3 mmol/L (ref 20.0–28.0)
Bicarbonate: 26.2 mmol/L (ref 20.0–28.0)
Bicarbonate: 27.4 mmol/L (ref 20.0–28.0)
Bicarbonate: 26.7 mmol/L (ref 20.0–28.0)
Bicarbonate: 24.6 mmol/L (ref 20.0–28.0)
Calcium, Ion: 1.08 mmol/L — ABNORMAL LOW (ref 1.15–1.40)
Calcium, Ion: 1.29 mmol/L (ref 1.15–1.40)
Calcium, Ion: 1.05 mmol/L — ABNORMAL LOW (ref 1.15–1.40)
Calcium, Ion: 1.07 mmol/L — ABNORMAL LOW (ref 1.15–1.40)
Calcium, Ion: 1.09 mmol/L — ABNORMAL LOW (ref 1.15–1.40)
Calcium, Ion: 1.1 mmol/L — ABNORMAL LOW (ref 1.15–1.40)
Calcium, Ion: 1.3 mmol/L (ref 1.15–1.40)
Calcium, Ion: 1.27 mmol/L (ref 1.15–1.40)
HCT: 34 % — ABNORMAL LOW (ref 39.0–52.0)
HCT: 45 % (ref 39.0–52.0)
HCT: 31 % — ABNORMAL LOW (ref 39.0–52.0)
HCT: 34 % — ABNORMAL LOW (ref 39.0–52.0)
HCT: 31 % — ABNORMAL LOW (ref 39.0–52.0)
HCT: 33 % — ABNORMAL LOW (ref 39.0–52.0)
HCT: 35 % — ABNORMAL LOW (ref 39.0–52.0)
HCT: 40 % (ref 39.0–52.0)
Hemoglobin: 11.6 g/dL — ABNORMAL LOW (ref 13.0–17.0)
Hemoglobin: 15.3 g/dL (ref 13.0–17.0)
Hemoglobin: 10.5 g/dL — ABNORMAL LOW (ref 13.0–17.0)
Hemoglobin: 11.6 g/dL — ABNORMAL LOW (ref 13.0–17.0)
Hemoglobin: 10.5 g/dL — ABNORMAL LOW (ref 13.0–17.0)
Hemoglobin: 11.2 g/dL — ABNORMAL LOW (ref 13.0–17.0)
Hemoglobin: 11.9 g/dL — ABNORMAL LOW (ref 13.0–17.0)
Hemoglobin: 13.6 g/dL (ref 13.0–17.0)
O2 Saturation: 100 %
O2 Saturation: 99 %
O2 Saturation: 100 %
O2 Saturation: 100 %
O2 Saturation: 100 %
O2 Saturation: 99 %
O2 Saturation: 100 %
O2 Saturation: 99 %
Patient temperature: 37
Potassium: 3.7 mmol/L (ref 3.5–5.1)
Potassium: 3.9 mmol/L (ref 3.5–5.1)
Potassium: 4 mmol/L (ref 3.5–5.1)
Potassium: 4.4 mmol/L (ref 3.5–5.1)
Potassium: 4.3 mmol/L (ref 3.5–5.1)
Potassium: 4.6 mmol/L (ref 3.5–5.1)
Potassium: 4.1 mmol/L (ref 3.5–5.1)
Potassium: 4.2 mmol/L (ref 3.5–5.1)
Sodium: 139 mmol/L (ref 135–145)
Sodium: 139 mmol/L (ref 135–145)
Sodium: 138 mmol/L (ref 135–145)
Sodium: 138 mmol/L (ref 135–145)
Sodium: 139 mmol/L (ref 135–145)
Sodium: 140 mmol/L (ref 135–145)
Sodium: 139 mmol/L (ref 135–145)
Sodium: 138 mmol/L (ref 135–145)
TCO2: 26 mmol/L (ref 22–32)
TCO2: 28 mmol/L (ref 22–32)
TCO2: 28 mmol/L (ref 22–32)
TCO2: 28 mmol/L (ref 22–32)
TCO2: 27 mmol/L (ref 22–32)
TCO2: 29 mmol/L (ref 22–32)
TCO2: 28 mmol/L (ref 22–32)
TCO2: 26 mmol/L (ref 22–32)
pCO2 arterial: 43.1 mmHg (ref 32.0–48.0)
pCO2 arterial: 47.9 mmHg (ref 32.0–48.0)
pCO2 arterial: 37.3 mmHg (ref 32.0–48.0)
pCO2 arterial: 38.7 mmHg (ref 32.0–48.0)
pCO2 arterial: 38.9 mmHg (ref 32.0–48.0)
pCO2 arterial: 40.3 mmHg (ref 32.0–48.0)
pCO2 arterial: 39.7 mmHg (ref 32.0–48.0)
pCO2 arterial: 37.5 mmHg (ref 32.0–48.0)
pH, Arterial: 7.321 — ABNORMAL LOW (ref 7.350–7.450)
pH, Arterial: 7.4 (ref 7.350–7.450)
pH, Arterial: 7.457 — ABNORMAL HIGH (ref 7.350–7.450)
pH, Arterial: 7.464 — ABNORMAL HIGH (ref 7.350–7.450)
pH, Arterial: 7.436 (ref 7.350–7.450)
pH, Arterial: 7.441 (ref 7.350–7.450)
pH, Arterial: 7.436 (ref 7.350–7.450)
pH, Arterial: 7.425 (ref 7.350–7.450)
pO2, Arterial: 167 mmHg — ABNORMAL HIGH (ref 83.0–108.0)
pO2, Arterial: 348 mmHg — ABNORMAL HIGH (ref 83.0–108.0)
pO2, Arterial: 287 mmHg — ABNORMAL HIGH (ref 83.0–108.0)
pO2, Arterial: 310 mmHg — ABNORMAL HIGH (ref 83.0–108.0)
pO2, Arterial: 141 mmHg — ABNORMAL HIGH (ref 83.0–108.0)
pO2, Arterial: 303 mmHg — ABNORMAL HIGH (ref 83.0–108.0)
pO2, Arterial: 436 mmHg — ABNORMAL HIGH (ref 83.0–108.0)
pO2, Arterial: 122 mmHg — ABNORMAL HIGH (ref 83.0–108.0)

## 2021-02-26 LAB — APTT
aPTT: 85 seconds — ABNORMAL HIGH (ref 24–36)
aPTT: 31 seconds (ref 24–36)

## 2021-02-26 LAB — POCT I-STAT, CHEM 8
BUN: 13 mg/dL (ref 8–23)
BUN: 11 mg/dL (ref 8–23)
BUN: 12 mg/dL (ref 8–23)
BUN: 12 mg/dL (ref 8–23)
BUN: 12 mg/dL (ref 8–23)
Calcium, Ion: 1.31 mmol/L (ref 1.15–1.40)
Calcium, Ion: <0.3 mmol/L — CL (ref 1.15–1.40)
Calcium, Ion: 1.06 mmol/L — ABNORMAL LOW (ref 1.15–1.40)
Calcium, Ion: 1.09 mmol/L — ABNORMAL LOW (ref 1.15–1.40)
Calcium, Ion: 1.33 mmol/L (ref 1.15–1.40)
Chloride: 103 mmol/L (ref 98–111)
Chloride: 100 mmol/L (ref 98–111)
Chloride: 103 mmol/L (ref 98–111)
Chloride: 105 mmol/L (ref 98–111)
Chloride: 106 mmol/L (ref 98–111)
Creatinine, Ser: 0.6 mg/dL — ABNORMAL LOW (ref 0.61–1.24)
Creatinine, Ser: 0.5 mg/dL — ABNORMAL LOW (ref 0.61–1.24)
Creatinine, Ser: 0.5 mg/dL — ABNORMAL LOW (ref 0.61–1.24)
Creatinine, Ser: 0.5 mg/dL — ABNORMAL LOW (ref 0.61–1.24)
Creatinine, Ser: 0.5 mg/dL — ABNORMAL LOW (ref 0.61–1.24)
Glucose, Bld: 157 mg/dL — ABNORMAL HIGH (ref 70–99)
Glucose, Bld: 112 mg/dL — ABNORMAL HIGH (ref 70–99)
Glucose, Bld: 101 mg/dL — ABNORMAL HIGH (ref 70–99)
Glucose, Bld: 110 mg/dL — ABNORMAL HIGH (ref 70–99)
Glucose, Bld: 107 mg/dL — ABNORMAL HIGH (ref 70–99)
HCT: 44 % (ref 39.0–52.0)
HCT: 33 % — ABNORMAL LOW (ref 39.0–52.0)
HCT: 32 % — ABNORMAL LOW (ref 39.0–52.0)
HCT: 32 % — ABNORMAL LOW (ref 39.0–52.0)
HCT: 33 % — ABNORMAL LOW (ref 39.0–52.0)
Hemoglobin: 15 g/dL (ref 13.0–17.0)
Hemoglobin: 11.2 g/dL — ABNORMAL LOW (ref 13.0–17.0)
Hemoglobin: 10.9 g/dL — ABNORMAL LOW (ref 13.0–17.0)
Hemoglobin: 10.9 g/dL — ABNORMAL LOW (ref 13.0–17.0)
Hemoglobin: 11.2 g/dL — ABNORMAL LOW (ref 13.0–17.0)
Potassium: 4 mmol/L (ref 3.5–5.1)
Potassium: 3.9 mmol/L (ref 3.5–5.1)
Potassium: 4.4 mmol/L (ref 3.5–5.1)
Potassium: 4.5 mmol/L (ref 3.5–5.1)
Potassium: 4.2 mmol/L (ref 3.5–5.1)
Sodium: 139 mmol/L (ref 135–145)
Sodium: 138 mmol/L (ref 135–145)
Sodium: 138 mmol/L (ref 135–145)
Sodium: 139 mmol/L (ref 135–145)
Sodium: 140 mmol/L (ref 135–145)
TCO2: 26 mmol/L (ref 22–32)
TCO2: 28 mmol/L (ref 22–32)
TCO2: 26 mmol/L (ref 22–32)
TCO2: 25 mmol/L (ref 22–32)
TCO2: 27 mmol/L (ref 22–32)

## 2021-02-26 LAB — CBC
HCT: 46.6 % (ref 39.0–52.0)
HCT: 41.1 % (ref 39.0–52.0)
HCT: 38.7 % — ABNORMAL LOW (ref 39.0–52.0)
Hemoglobin: 15.8 g/dL (ref 13.0–17.0)
Hemoglobin: 13.5 g/dL (ref 13.0–17.0)
Hemoglobin: 12.6 g/dL — ABNORMAL LOW (ref 13.0–17.0)
MCH: 30.4 pg (ref 26.0–34.0)
MCH: 30.4 pg (ref 26.0–34.0)
MCH: 30.3 pg (ref 26.0–34.0)
MCHC: 33.9 g/dL (ref 30.0–36.0)
MCHC: 32.8 g/dL (ref 30.0–36.0)
MCHC: 32.6 g/dL (ref 30.0–36.0)
MCV: 89.8 fL (ref 80.0–100.0)
MCV: 92.6 fL (ref 80.0–100.0)
MCV: 93 fL (ref 80.0–100.0)
Platelets: 235 10*3/uL (ref 150–400)
Platelets: 136 10*3/uL — ABNORMAL LOW (ref 150–400)
Platelets: 147 10*3/uL — ABNORMAL LOW (ref 150–400)
RBC: 5.19 MIL/uL (ref 4.22–5.81)
RBC: 4.44 MIL/uL (ref 4.22–5.81)
RBC: 4.16 MIL/uL — ABNORMAL LOW (ref 4.22–5.81)
RDW: 15.4 % (ref 11.5–15.5)
RDW: 15.5 % (ref 11.5–15.5)
RDW: 15.6 % — ABNORMAL HIGH (ref 11.5–15.5)
WBC: 6.6 10*3/uL (ref 4.0–10.5)
WBC: 7 10*3/uL (ref 4.0–10.5)
WBC: 7.7 10*3/uL (ref 4.0–10.5)
nRBC: 0 % (ref 0.0–0.2)
nRBC: 0 % (ref 0.0–0.2)
nRBC: 0 % (ref 0.0–0.2)

## 2021-02-26 LAB — ECHO INTRAOPERATIVE TEE
AV Mean grad: 8 mmHg
AV Peak grad: 14 mmHg
Ao pk vel: 1.87 m/s
Area-P 1/2: 1.32 cm2
Height: 78 in
S' Lateral: 4.9 cm
Weight: 3304 oz

## 2021-02-26 LAB — BASIC METABOLIC PANEL
Anion gap: 7 (ref 5–15)
BUN: 11 mg/dL (ref 8–23)
CO2: 20 mmol/L — ABNORMAL LOW (ref 22–32)
Calcium: 8.2 mg/dL — ABNORMAL LOW (ref 8.9–10.3)
Chloride: 109 mmol/L (ref 98–111)
Creatinine, Ser: 0.76 mg/dL (ref 0.61–1.24)
GFR, Estimated: >60 mL/min (ref >60)
Glucose, Bld: 105 mg/dL — ABNORMAL HIGH (ref 70–99)
Potassium: 4.7 mmol/L (ref 3.5–5.1)
Sodium: 136 mmol/L (ref 135–145)

## 2021-02-26 LAB — PROTIME-INR
INR: 1.3 — ABNORMAL HIGH (ref 0.8–1.2)
INR: 1.5 — ABNORMAL HIGH (ref 0.8–1.2)
Prothrombin Time: 15.8 seconds — ABNORMAL HIGH (ref 11.4–15.2)
Prothrombin Time: 18.2 seconds — ABNORMAL HIGH (ref 11.4–15.2)

## 2021-02-26 LAB — MAGNESIUM
Magnesium: 2.2 mg/dL (ref 1.7–2.4)
Magnesium: 2.9 mg/dL — ABNORMAL HIGH (ref 1.7–2.4)

## 2021-02-26 LAB — HEMOGLOBIN AND HEMATOCRIT, BLOOD
HCT: 32.2 % — ABNORMAL LOW (ref 39.0–52.0)
Hemoglobin: 11 g/dL — ABNORMAL LOW (ref 13.0–17.0)

## 2021-02-26 LAB — PLATELET COUNT: Platelets: 170 10*3/uL (ref 150–400)

## 2021-02-26 LAB — HEPARIN LEVEL (UNFRACTIONATED): Heparin Unfractionated: 0.53 IU/mL (ref 0.30–0.70)

## 2021-02-26 SURGERY — CORONARY ARTERY BYPASS GRAFTING (CABG)
Anesthesia: General | Site: Chest | Laterality: Right

## 2021-02-26 MED ORDER — MIDAZOLAM HCL (PF) 5 MG/ML IJ SOLN
INTRAMUSCULAR | Status: DC | PRN
Start: 2021-02-26 — End: 2021-02-26
  Administered 2021-02-26 (×2): 1 mg via INTRAVENOUS
  Administered 2021-02-26 (×2): 2 mg via INTRAVENOUS
  Administered 2021-02-26: 3 mg via INTRAVENOUS
  Administered 2021-02-26: 1 mg via INTRAVENOUS

## 2021-02-26 MED ORDER — CHLORHEXIDINE GLUCONATE 0.12 % MT SOLN
15.0000 mL | OROMUCOSAL | Status: AC
  Administered 2021-02-26: 15 mL via OROMUCOSAL

## 2021-02-26 MED ORDER — ASPIRIN 81 MG PO CHEW: 324.0000 mg | CHEWABLE_TABLET | Freq: Every day | ORAL | Status: DC

## 2021-02-26 MED ORDER — METOPROLOL TARTRATE 5 MG/5ML IV SOLN: 2.5000 mg | INTRAVENOUS | Status: DC | PRN

## 2021-02-26 MED ORDER — SODIUM CHLORIDE 0.45 % IV SOLN: INTRAVENOUS | Status: DC | PRN

## 2021-02-26 MED ORDER — DEXTROSE 50 % IV SOLN: 0.0000 mL | INTRAVENOUS | Status: DC | PRN

## 2021-02-26 MED ORDER — PANTOPRAZOLE SODIUM 40 MG PO TBEC
40.0000 mg | DELAYED_RELEASE_TABLET | Freq: Every day | ORAL | Status: DC
  Administered 2021-02-28 – 2021-03-04 (×5): 40 mg via ORAL
  Filled 2021-02-26 (×5): qty 1

## 2021-02-26 MED ORDER — SODIUM CHLORIDE 0.9 % IV BOLUS
250.0000 mL | INTRAVENOUS | Status: DC | PRN
  Administered 2021-02-27: 250 mL via INTRAVENOUS

## 2021-02-26 MED ORDER — LACTATED RINGERS IV SOLN: INTRAVENOUS | Status: DC

## 2021-02-26 MED ORDER — DOBUTAMINE INFUSION FOR EP/ECHO/NUC (1000 MCG/ML)
0.0000 ug/kg/min | INTRAVENOUS | Status: DC
  Filled 2021-02-26: qty 250

## 2021-02-26 MED ORDER — BISACODYL 10 MG RE SUPP: 10.0000 mg | Freq: Every day | RECTAL | Status: DC

## 2021-02-26 MED ORDER — MORPHINE SULFATE (PF) 2 MG/ML IV SOLN
1.0000 mg | INTRAVENOUS | Status: DC | PRN
  Administered 2021-02-26 – 2021-02-28 (×10): 2 mg via INTRAVENOUS
  Filled 2021-02-26 (×10): qty 1

## 2021-02-26 MED ORDER — BISACODYL 5 MG PO TBEC
10.0000 mg | DELAYED_RELEASE_TABLET | Freq: Every day | ORAL | Status: DC
  Administered 2021-02-27 – 2021-02-28 (×2): 10 mg via ORAL
  Filled 2021-02-26 (×3): qty 2

## 2021-02-26 MED ORDER — PHENYLEPHRINE 40 MCG/ML (10ML) SYRINGE FOR IV PUSH (FOR BLOOD PRESSURE SUPPORT)
PREFILLED_SYRINGE | INTRAVENOUS | Status: DC | PRN
Start: 2021-02-26 — End: 2021-02-26
  Administered 2021-02-26: 80 ug via INTRAVENOUS
  Administered 2021-02-26 (×2): 40 ug via INTRAVENOUS

## 2021-02-26 MED ORDER — CEFAZOLIN SODIUM-DEXTROSE 2-4 GM/100ML-% IV SOLN
2.0000 g | Freq: Three times a day (TID) | INTRAVENOUS | Status: AC
  Administered 2021-02-26 – 2021-02-28 (×6): 2 g via INTRAVENOUS
  Filled 2021-02-26 (×6): qty 100

## 2021-02-26 MED ORDER — PROPOFOL 10 MG/ML IV BOLUS
INTRAVENOUS | Status: DC | PRN
  Administered 2021-02-26: 70 mg via INTRAVENOUS

## 2021-02-26 MED ORDER — LACTATED RINGERS IV SOLN: INTRAVENOUS | Status: DC | PRN

## 2021-02-26 MED ORDER — PROTAMINE SULFATE 10 MG/ML IV SOLN
INTRAVENOUS | Status: DC | PRN
  Administered 2021-02-26: 300 mg via INTRAVENOUS

## 2021-02-26 MED ORDER — SODIUM CHLORIDE 0.9% FLUSH
3.0000 mL | Freq: Two times a day (BID) | INTRAVENOUS | Status: DC
  Administered 2021-02-27 – 2021-02-28 (×2): 3 mL via INTRAVENOUS
  Administered 2021-02-28: 10 mL via INTRAVENOUS
  Administered 2021-03-01: 3 mL via INTRAVENOUS

## 2021-02-26 MED ORDER — CHLORHEXIDINE GLUCONATE CLOTH 2 % EX PADS
6.0000 | MEDICATED_PAD | Freq: Every day | CUTANEOUS | Status: DC
  Administered 2021-02-26 – 2021-03-01 (×2): 6 via TOPICAL

## 2021-02-26 MED ORDER — LACTATED RINGERS IV SOLN: 500.0000 mL | Freq: Once | INTRAVENOUS | Status: DC | PRN

## 2021-02-26 MED ORDER — MAGNESIUM SULFATE 4 GM/100ML IV SOLN
4.0000 g | Freq: Once | INTRAVENOUS | Status: AC
  Administered 2021-02-26: 4 g via INTRAVENOUS
  Filled 2021-02-26: qty 100

## 2021-02-26 MED ORDER — INSULIN REGULAR(HUMAN) IN NACL 100-0.9 UT/100ML-% IV SOLN: INTRAVENOUS | Status: DC

## 2021-02-26 MED ORDER — DOCUSATE SODIUM 100 MG PO CAPS
200.0000 mg | ORAL_CAPSULE | Freq: Every day | ORAL | Status: DC
  Administered 2021-02-27 – 2021-03-02 (×4): 200 mg via ORAL
  Filled 2021-02-26 (×6): qty 2

## 2021-02-26 MED ORDER — SODIUM CHLORIDE 0.9% FLUSH: 10.0000 mL | INTRAVENOUS | Status: DC | PRN

## 2021-02-26 MED ORDER — ACETAMINOPHEN 160 MG/5ML PO SOLN: 1000.0000 mg | Freq: Four times a day (QID) | ORAL | Status: AC

## 2021-02-26 MED ORDER — CEFAZOLIN SODIUM-DEXTROSE 2-3 GM-%(50ML) IV SOLR: INTRAVENOUS | Status: DC | PRN

## 2021-02-26 MED ORDER — MIDAZOLAM HCL (PF) 10 MG/2ML IJ SOLN
INTRAMUSCULAR | Status: AC
  Filled 2021-02-26: qty 2

## 2021-02-26 MED ORDER — SODIUM CHLORIDE 0.9 % IV SOLN: 250.0000 mL | INTRAVENOUS | Status: DC

## 2021-02-26 MED ORDER — METOPROLOL TARTRATE 25 MG/10 ML ORAL SUSPENSION: 12.5000 mg | Freq: Two times a day (BID) | ORAL | Status: DC

## 2021-02-26 MED ORDER — ROCURONIUM BROMIDE 10 MG/ML (PF) SYRINGE
PREFILLED_SYRINGE | INTRAVENOUS | Status: AC
  Filled 2021-02-26: qty 10

## 2021-02-26 MED ORDER — SODIUM CHLORIDE (PF) 0.9 % IJ SOLN
INTRAMUSCULAR | Status: AC
  Filled 2021-02-26: qty 20

## 2021-02-26 MED ORDER — MIDAZOLAM HCL 2 MG/2ML IJ SOLN: 2.0000 mg | INTRAMUSCULAR | Status: DC | PRN

## 2021-02-26 MED ORDER — DEXMEDETOMIDINE HCL IN NACL 400 MCG/100ML IV SOLN
0.0000 ug/kg/h | INTRAVENOUS | Status: DC
  Administered 2021-02-26: 0.7 ug/kg/h via INTRAVENOUS
  Filled 2021-02-26: qty 100

## 2021-02-26 MED ORDER — MUPIROCIN 2 % EX OINT: 1.0000 "application " | TOPICAL_OINTMENT | Freq: Two times a day (BID) | CUTANEOUS | Status: DC

## 2021-02-26 MED ORDER — METOPROLOL TARTRATE 12.5 MG HALF TABLET: 12.5000 mg | ORAL_TABLET | Freq: Two times a day (BID) | ORAL | Status: DC

## 2021-02-26 MED ORDER — ACETAMINOPHEN 160 MG/5ML PO SOLN: 650.0000 mg | Freq: Once | ORAL | Status: AC

## 2021-02-26 MED ORDER — SODIUM CHLORIDE (PF) 0.9 % IJ SOLN
OROMUCOSAL | Status: DC | PRN
  Administered 2021-02-26 (×2): 4 mL via TOPICAL

## 2021-02-26 MED ORDER — FENTANYL CITRATE (PF) 250 MCG/5ML IJ SOLN
INTRAMUSCULAR | Status: DC | PRN
  Administered 2021-02-26 (×4): 100 ug via INTRAVENOUS
  Administered 2021-02-26: 50 ug via INTRAVENOUS
  Administered 2021-02-26: 100 ug via INTRAVENOUS
  Administered 2021-02-26 (×3): 50 ug via INTRAVENOUS
  Administered 2021-02-26: 100 ug via INTRAVENOUS
  Administered 2021-02-26 (×2): 50 ug via INTRAVENOUS
  Administered 2021-02-26: 150 ug via INTRAVENOUS
  Administered 2021-02-26: 50 ug via INTRAVENOUS
  Administered 2021-02-26: 150 ug via INTRAVENOUS

## 2021-02-26 MED ORDER — POTASSIUM CHLORIDE 10 MEQ/50ML IV SOLN: 10.0000 meq | INTRAVENOUS | Status: AC

## 2021-02-26 MED ORDER — ALBUMIN HUMAN 5 % IV SOLN
250.0000 mL | INTRAVENOUS | Status: AC | PRN
  Administered 2021-02-26 (×2): 12.5 g via INTRAVENOUS

## 2021-02-26 MED ORDER — ROCURONIUM BROMIDE 10 MG/ML (PF) SYRINGE
PREFILLED_SYRINGE | INTRAVENOUS | Status: DC | PRN
Start: 2021-02-26 — End: 2021-02-26
  Administered 2021-02-26: 50 mg via INTRAVENOUS
  Administered 2021-02-26: 30 mg via INTRAVENOUS
  Administered 2021-02-26: 50 mg via INTRAVENOUS

## 2021-02-26 MED ORDER — NITROGLYCERIN IN D5W 200-5 MCG/ML-% IV SOLN
0.0000 ug/min | INTRAVENOUS | Status: DC
Start: 2021-02-26 — End: 2021-02-27

## 2021-02-26 MED ORDER — ALBUMIN HUMAN 5 % IV SOLN
250.0000 mL | INTRAVENOUS | Status: DC | PRN
Start: 2021-02-26 — End: 2021-02-26

## 2021-02-26 MED ORDER — NICARDIPINE HCL IN NACL 20-0.86 MG/200ML-% IV SOLN: 0.0000 mg/h | INTRAVENOUS | Status: DC

## 2021-02-26 MED ORDER — FAMOTIDINE IN NACL 20-0.9 MG/50ML-% IV SOLN
20.0000 mg | Freq: Two times a day (BID) | INTRAVENOUS | Status: DC
  Administered 2021-02-26: 20 mg via INTRAVENOUS
  Filled 2021-02-26: qty 50

## 2021-02-26 MED ORDER — HEPARIN SODIUM (PORCINE) 1000 UNIT/ML IJ SOLN
INTRAMUSCULAR | Status: AC
  Filled 2021-02-26: qty 1

## 2021-02-26 MED ORDER — CHLORHEXIDINE GLUCONATE 0.12% ORAL RINSE (MEDLINE KIT)
15.0000 mL | Freq: Two times a day (BID) | OROMUCOSAL | Status: DC
  Administered 2021-02-26 – 2021-03-03 (×8): 15 mL via OROMUCOSAL

## 2021-02-26 MED ORDER — FENTANYL CITRATE (PF) 250 MCG/5ML IJ SOLN
INTRAMUSCULAR | Status: AC
  Filled 2021-02-26: qty 25

## 2021-02-26 MED ORDER — SODIUM CHLORIDE 0.9% FLUSH: 3.0000 mL | INTRAVENOUS | Status: DC | PRN

## 2021-02-26 MED ORDER — NICARDIPINE HCL IN NACL 20-0.86 MG/200ML-% IV SOLN
3.0000 mg/h | INTRAVENOUS | Status: AC
  Administered 2021-02-26: 3 mg/h via INTRAVENOUS

## 2021-02-26 MED ORDER — NOREPINEPHRINE 4 MG/250ML-% IV SOLN: 0.0000 ug/min | INTRAVENOUS | Status: DC

## 2021-02-26 MED ORDER — PHENYLEPHRINE HCL-NACL 20-0.9 MG/250ML-% IV SOLN
0.0000 ug/min | INTRAVENOUS | Status: DC
  Administered 2021-02-27: 60 ug/min via INTRAVENOUS
  Administered 2021-02-27: 50 ug/min via INTRAVENOUS
  Administered 2021-02-28: 20 ug/min via INTRAVENOUS
  Filled 2021-02-26 (×3): qty 250

## 2021-02-26 MED ORDER — ACETAMINOPHEN 500 MG PO TABS
1000.0000 mg | ORAL_TABLET | Freq: Four times a day (QID) | ORAL | Status: AC
  Administered 2021-02-26 – 2021-03-03 (×18): 1000 mg via ORAL
  Filled 2021-02-26 (×18): qty 2

## 2021-02-26 MED ORDER — OXYCODONE HCL 5 MG PO TABS
5.0000 mg | ORAL_TABLET | ORAL | Status: DC | PRN
  Administered 2021-02-27 – 2021-03-01 (×12): 5 mg via ORAL
  Filled 2021-02-26 (×12): qty 1

## 2021-02-26 MED ORDER — PLASMA-LYTE A IV SOLN
INTRAVENOUS | Status: DC | PRN
  Administered 2021-02-26: 1000 mL via INTRAVASCULAR

## 2021-02-26 MED ORDER — HEPARIN SODIUM (PORCINE) 1000 UNIT/ML IJ SOLN
INTRAMUSCULAR | Status: DC | PRN
  Administered 2021-02-26: 33000 [IU] via INTRAVENOUS

## 2021-02-26 MED ORDER — SODIUM BICARBONATE 8.4 % IV SOLN
50.0000 meq | Freq: Once | INTRAVENOUS | Status: AC
  Administered 2021-02-26: 50 meq via INTRAVENOUS

## 2021-02-26 MED ORDER — ORAL CARE MOUTH RINSE
15.0000 mL | OROMUCOSAL | Status: DC
  Administered 2021-02-26 (×2): 15 mL via OROMUCOSAL

## 2021-02-26 MED ORDER — TRAMADOL HCL 50 MG PO TABS
50.0000 mg | ORAL_TABLET | ORAL | Status: DC | PRN
  Administered 2021-02-27 – 2021-03-04 (×6): 50 mg via ORAL
  Filled 2021-02-26 (×7): qty 1

## 2021-02-26 MED ORDER — DOBUTAMINE IN D5W 4-5 MG/ML-% IV SOLN: 0.0000 ug/kg/min | INTRAVENOUS | Status: DC

## 2021-02-26 MED ORDER — ACETAMINOPHEN 650 MG RE SUPP
650.0000 mg | Freq: Once | RECTAL | Status: AC
  Administered 2021-02-26: 650 mg via RECTAL

## 2021-02-26 MED ORDER — ASPIRIN EC 325 MG PO TBEC
325.0000 mg | DELAYED_RELEASE_TABLET | Freq: Every day | ORAL | Status: DC
  Administered 2021-02-27 – 2021-03-03 (×5): 325 mg via ORAL
  Filled 2021-02-26 (×5): qty 1

## 2021-02-26 MED ORDER — ALBUMIN HUMAN 25 % IV SOLN
12.5000 g | INTRAVENOUS | Status: DC | PRN
Start: 2021-02-26 — End: 2021-03-03
  Administered 2021-02-26 – 2021-02-27 (×2): 12.5 g via INTRAVENOUS
  Filled 2021-02-26 (×2): qty 50

## 2021-02-26 MED ORDER — CHLORHEXIDINE GLUCONATE CLOTH 2 % EX PADS
6.0000 | MEDICATED_PAD | Freq: Every day | CUTANEOUS | Status: AC
  Administered 2021-02-26 – 2021-03-01 (×3): 6 via TOPICAL

## 2021-02-26 MED ORDER — PROPOFOL 10 MG/ML IV BOLUS
INTRAVENOUS | Status: AC
  Filled 2021-02-26: qty 40

## 2021-02-26 MED ORDER — SODIUM CHLORIDE 0.9% FLUSH
10.0000 mL | Freq: Two times a day (BID) | INTRAVENOUS | Status: DC
Start: 2021-02-26 — End: 2021-03-02
  Administered 2021-02-27 – 2021-02-28 (×2): 10 mL
  Administered 2021-02-28: 20 mL
  Administered 2021-03-01: 10 mL

## 2021-02-26 MED ORDER — ONDANSETRON HCL 4 MG/2ML IJ SOLN: 4.0000 mg | Freq: Four times a day (QID) | INTRAMUSCULAR | Status: DC | PRN

## 2021-02-26 MED ORDER — VASOPRESSIN 20 UNITS/100 ML INFUSION FOR SHOCK
0.0000 [IU]/min | INTRAVENOUS | Status: AC
  Administered 2021-02-26: .03 [IU]/min via INTRAVENOUS
  Filled 2021-02-26: qty 100

## 2021-02-26 MED ORDER — 0.9 % SODIUM CHLORIDE (POUR BTL) OPTIME
TOPICAL | Status: DC | PRN
  Administered 2021-02-26: 6000 mL

## 2021-02-26 MED ORDER — SODIUM CHLORIDE 0.9 % IV SOLN: INTRAVENOUS | Status: DC

## 2021-02-26 MED ORDER — DOBUTAMINE IN D5W 4-5 MG/ML-% IV SOLN
2.5000 ug/kg/min | INTRAVENOUS | Status: AC
  Administered 2021-02-26: 2.5 ug/kg/min via INTRAVENOUS
  Filled 2021-02-26: qty 250

## 2021-02-26 MED ORDER — VANCOMYCIN HCL IN DEXTROSE 1-5 GM/200ML-% IV SOLN
1000.0000 mg | Freq: Once | INTRAVENOUS | Status: AC
  Administered 2021-02-26: 1000 mg via INTRAVENOUS
  Filled 2021-02-26: qty 200

## 2021-02-26 SURGICAL SUPPLY — 101 items
ADH SKN CLS APL DERMABOND .7 (GAUZE/BANDAGES/DRESSINGS) ×3
ATRICLIP EXCLUSION VLAA 45 (Miscellaneous) ×4 IMPLANT
BAG DECANTER FOR FLEXI CONT (MISCELLANEOUS) ×4 IMPLANT
BLADE CLIPPER SURG (BLADE) ×4 IMPLANT
BLADE STERNUM SYSTEM 6 (BLADE) ×4 IMPLANT
BLADE SURG 11 STRL SS (BLADE) ×1 IMPLANT
BNDG CMPR STD VLCR NS LF 5.8X4 (GAUZE/BANDAGES/DRESSINGS) ×3
BNDG ELASTIC 4X5.8 VLCR NS LF (GAUZE/BANDAGES/DRESSINGS) ×1 IMPLANT
BNDG ELASTIC 4X5.8 VLCR STR LF (GAUZE/BANDAGES/DRESSINGS) ×4 IMPLANT
BNDG ELASTIC 6X5.8 VLCR STR LF (GAUZE/BANDAGES/DRESSINGS) ×4 IMPLANT
BNDG GAUZE ELAST 4 BULKY (GAUZE/BANDAGES/DRESSINGS) ×4 IMPLANT
CABLE SURGICAL S-101-97-12 (CABLE) ×4 IMPLANT
CANISTER SUCT 3000ML PPV (MISCELLANEOUS) ×4 IMPLANT
CANNULA MC2 2 STG 29/37 NON-V (CANNULA) ×3 IMPLANT
CANNULA MC2 TWO STAGE (CANNULA) ×4
CANNULA NON VENT 20FR 12 (CANNULA) ×4 IMPLANT
CATH ROBINSON RED A/P 18FR (CATHETERS) ×8 IMPLANT
CLIP FOGARTY SPRING 6M (CLIP) ×1 IMPLANT
CLIP RETRACTION 3.0MM CORONARY (MISCELLANEOUS) ×4 IMPLANT
CLIP VESOCCLUDE MED 24/CT (CLIP) ×1 IMPLANT
CLIP VESOCCLUDE SM WIDE 24/CT (CLIP) ×1 IMPLANT
CONN ST 1/2X1/2  BEN (MISCELLANEOUS) ×4
CONN ST 1/2X1/2 BEN (MISCELLANEOUS) ×3 IMPLANT
CONNECTOR BLAKE 2:1 CARIO BLK (MISCELLANEOUS) ×4 IMPLANT
CONTAINER PROTECT SURGISLUSH (MISCELLANEOUS) ×5 IMPLANT
DERMABOND ADVANCED (GAUZE/BANDAGES/DRESSINGS) ×1
DERMABOND ADVANCED .7 DNX12 (GAUZE/BANDAGES/DRESSINGS) IMPLANT
DEVICE EXCLUSIN ATRCLP VLAA 45 (Miscellaneous) IMPLANT
DRAIN CHANNEL 19F RND (DRAIN) ×12 IMPLANT
DRAIN CONNECTOR BLAKE 1:1 (MISCELLANEOUS) ×4 IMPLANT
DRAPE CARDIOVASCULAR INCISE (DRAPES) ×4
DRAPE SRG 135X102X78XABS (DRAPES) ×3 IMPLANT
DRAPE WARM FLUID 44X44 (DRAPES) ×4 IMPLANT
DRSG AQUACEL AG ADV 3.5X10 (GAUZE/BANDAGES/DRESSINGS) ×4 IMPLANT
DRSG AQUACEL AG ADV 3.5X14 (GAUZE/BANDAGES/DRESSINGS) ×1 IMPLANT
DRSG COVADERM 4X14 (GAUZE/BANDAGES/DRESSINGS) ×4 IMPLANT
ELECT BLADE 4.0 EZ CLEAN MEGAD (MISCELLANEOUS) ×4
ELECT REM PT RETURN 9FT ADLT (ELECTROSURGICAL) ×8
ELECTRODE BLDE 4.0 EZ CLN MEGD (MISCELLANEOUS) ×3 IMPLANT
ELECTRODE REM PT RTRN 9FT ADLT (ELECTROSURGICAL) ×6 IMPLANT
FELT TEFLON 1X6 (MISCELLANEOUS) ×7 IMPLANT
GAUZE 4X4 16PLY ~~LOC~~+RFID DBL (SPONGE) ×1 IMPLANT
GAUZE SPONGE 4X4 12PLY STRL (GAUZE/BANDAGES/DRESSINGS) ×8 IMPLANT
GLOVE SURG ENC MOIS LTX SZ7 (GLOVE) ×8 IMPLANT
GLOVE SURG ENC TEXT LTX SZ7.5 (GLOVE) ×8 IMPLANT
GLOVE SURG MICRO LTX SZ6 (GLOVE) ×3 IMPLANT
GLOVE SURG MICRO LTX SZ6.5 (GLOVE) ×7 IMPLANT
GOWN STRL REUS W/ TWL LRG LVL3 (GOWN DISPOSABLE) ×12 IMPLANT
GOWN STRL REUS W/ TWL XL LVL3 (GOWN DISPOSABLE) ×6 IMPLANT
GOWN STRL REUS W/TWL LRG LVL3 (GOWN DISPOSABLE) ×32
GOWN STRL REUS W/TWL XL LVL3 (GOWN DISPOSABLE) ×8
HEMOSTAT POWDER SURGIFOAM 1G (HEMOSTASIS) ×12 IMPLANT
INSERT FOGARTY XLG (MISCELLANEOUS) ×1 IMPLANT
INSERT SUTURE HOLDER (MISCELLANEOUS) ×5 IMPLANT
KIT BASIN OR (CUSTOM PROCEDURE TRAY) ×4 IMPLANT
KIT SUCTION CATH 14FR (SUCTIONS) ×4 IMPLANT
KIT TURNOVER KIT B (KITS) ×4 IMPLANT
KIT VASOVIEW HEMOPRO 2 VH 4000 (KITS) ×4 IMPLANT
LEAD PACING MYOCARDI (MISCELLANEOUS) ×5 IMPLANT
MARKER GRAFT CORONARY BYPASS (MISCELLANEOUS) ×13 IMPLANT
NS IRRIG 1000ML POUR BTL (IV SOLUTION) ×20 IMPLANT
PACK ACCESSORY CANNULA KIT (KITS) ×4 IMPLANT
PACK E OPEN HEART (SUTURE) ×4 IMPLANT
PACK OPEN HEART (CUSTOM PROCEDURE TRAY) ×4 IMPLANT
PAD ARMBOARD 7.5X6 YLW CONV (MISCELLANEOUS) ×8 IMPLANT
PAD ELECT DEFIB RADIOL ZOLL (MISCELLANEOUS) ×4 IMPLANT
PENCIL BUTTON HOLSTER BLD 10FT (ELECTRODE) ×4 IMPLANT
POSITIONER HEAD DONUT 9IN (MISCELLANEOUS) ×4 IMPLANT
PUNCH AORTIC ROTATE 4.0MM (MISCELLANEOUS) ×4 IMPLANT
SET MPS 3-ND DEL (MISCELLANEOUS) ×1 IMPLANT
SPONGE T-LAP 18X18 ~~LOC~~+RFID (SPONGE) ×4 IMPLANT
SUPPORT HEART JANKE-BARRON (MISCELLANEOUS) ×4 IMPLANT
SUT BONE WAX W31G (SUTURE) ×4 IMPLANT
SUT ETHIBOND X763 2 0 SH 1 (SUTURE) ×8 IMPLANT
SUT MNCRL AB 3-0 PS2 18 (SUTURE) ×8 IMPLANT
SUT MNCRL AB 4-0 PS2 18 (SUTURE) ×1 IMPLANT
SUT PDS AB 1 CTX 36 (SUTURE) ×8 IMPLANT
SUT PROLENE 3 0 SH DA (SUTURE) ×1 IMPLANT
SUT PROLENE 3 0 SH1 36 (SUTURE) ×1 IMPLANT
SUT PROLENE 4 0 RB 1 (SUTURE) ×20
SUT PROLENE 4 0 SH DA (SUTURE) ×4 IMPLANT
SUT PROLENE 4-0 RB1 .5 CRCL 36 (SUTURE) IMPLANT
SUT PROLENE 5 0 C 1 36 (SUTURE) ×17 IMPLANT
SUT PROLENE 7 0 BV 1 (SUTURE) ×2 IMPLANT
SUT PROLENE 7 0 BV1 MDA (SUTURE) ×4 IMPLANT
SUT STEEL 6MS V (SUTURE) ×8 IMPLANT
SUT STEEL SZ 6 DBL 3X14 BALL (SUTURE) IMPLANT
SUT VIC AB 1 CTX 36 (SUTURE) ×4
SUT VIC AB 1 CTX36XBRD ANBCTR (SUTURE) IMPLANT
SUT VIC AB 2-0 CT1 27 (SUTURE) ×4
SUT VIC AB 2-0 CT1 TAPERPNT 27 (SUTURE) IMPLANT
SYSTEM SAHARA CHEST DRAIN ATS (WOUND CARE) ×4 IMPLANT
TAPE CLOTH 4X10 WHT NS (GAUZE/BANDAGES/DRESSINGS) ×1 IMPLANT
TAPE PAPER 2X10 WHT MICROPORE (GAUZE/BANDAGES/DRESSINGS) ×1 IMPLANT
TOWEL GREEN STERILE (TOWEL DISPOSABLE) ×4 IMPLANT
TOWEL GREEN STERILE FF (TOWEL DISPOSABLE) ×4 IMPLANT
TRAY FOLEY SLVR 16FR TEMP STAT (SET/KITS/TRAYS/PACK) ×4 IMPLANT
TUBE SUCT INTRACARD DLP 20F (MISCELLANEOUS) ×2 IMPLANT
TUBING LAP HI FLOW INSUFFLATIO (TUBING) ×4 IMPLANT
UNDERPAD 30X36 HEAVY ABSORB (UNDERPADS AND DIAPERS) ×4 IMPLANT
WATER STERILE IRR 1000ML POUR (IV SOLUTION) ×9 IMPLANT

## 2021-02-26 NOTE — Procedures (Signed)
Extubation Procedure Note  Patient Details:   Name: Derek Bond DOB: 1946-01-11 MRN: [id number]   Airway Documentation:    Vent end date: 02/26/21 Vent end time: 2157   Evaluation  O2 sats: stable throughout Complications: No apparent complications Patient did tolerate procedure well. Bilateral Breath Sounds: Clear, Diminished   Yes  NIF -20 FVC 920 mL Positive Cuff Leak 2L Vanduser Emry Barbato V 02/26/2021, 10:04 PM

## 2021-02-26 NOTE — Transfer of Care (Signed)
Immediate Anesthesia Transfer of Care Note  Patient: Derek Bond  Procedure(s) Performed: CORONARY ARTERY BYPASS GRAFTING (CABG) X4, ON PUMP, USING LEFT INTERNAL MAMMARY ARTERY AND RIGHT ENDOSCOPICALLY HARVESTED GREATER SAPHENOUS VEIN (Chest) CLIPPING OF ATRIAL APPENDAGE USING ATRICURE CLIP SIZE 38 MAZE TRANSESOPHAGEAL ECHOCARDIOGRAM (TEE) APPLICATION OF CELL SAVER ENDOVEIN HARVEST OF GREATER SAPHENOUS VEIN (Right)  Patient Location: SICU  Anesthesia Type:General  Level of Consciousness: sedated and Patient remains intubated per anesthesia plan  Airway & Oxygen Therapy: Patient remains intubated per anesthesia plan and Patient placed on Ventilator (see vital sign flow sheet for setting)  Post-op Assessment: Report given to RN and Post -op Vital signs reviewed and stable  Post vital signs: Reviewed and stable  Last Vitals:  Vitals Value Taken Time  BP    Temp 37 C 02/26/21 1410  Pulse 75 02/26/21 1410  Resp 14 02/26/21 1410  SpO2 99 % 02/26/21 1410  Vitals shown include unvalidated device data.  Last Pain:  Vitals:   02/26/21 0509  TempSrc: Oral  PainSc:       Patients Stated Pain Goal: 0 (99/14/44 5848)  Complications: No notable events documented.

## 2021-02-26 NOTE — Anesthesia Procedure Notes (Signed)
Central Venous Catheter Insertion Performed by: Effie Berkshire, MD, anesthesiologist Start/End10/13/2022 8:00 AM, 02/26/2021 8:05 AM Patient location: Pre-op. Preanesthetic checklist: patient identified, IV checked, site marked, risks and benefits discussed, surgical consent, monitors and equipment checked, pre-op evaluation, timeout performed and anesthesia consent Hand hygiene performed  and maximum sterile barriers used  PA cath was placed.Swan type:thermodilution Procedure performed without using ultrasound guided technique. Attempts: 1 Patient tolerated the procedure well with no immediate complications.

## 2021-02-26 NOTE — Op Note (Signed)
[address]       [place] [address]             [phone]                                          02/26/2021 Patient:  Redell R Dannemiller Pre-Op Dx: Coronary artery disease   Congestive heart failure   Chronic atrial fibrillation   Diabetes mellitus    Post-op Dx: Same Procedure: CABG X 4, LIMA to LAD, reverse saphenous vein graft to posterior lateral branch, reverse saphenous vein graft to obtuse marginal #1, reverse saphenous vein graft to first diagonal. Left atrial Maze procedure with a box lesion set using the AtriCure clamp. 45 mm atrial clip placement Endoscopic greater saphenous vein harvest on the right   Surgeon and Role:      * Eliu Batch, Lucile Crater, MD - Primary    *D. Tacy Dura, PA-C- assisting  Anesthesia  general EBL: 1000 ml Blood Administration: None Xclamp Time:  69 min Pump Time:  16min  Drains: 59 F blake drain: L, mediastinal  Wires: Ventricular Counts: correct   Indications: This is a 75 year old gentleman that is admitted with new onset heart failure symptoms.  He underwent a left heart catheterization on which showed severe three-vessel coronary disease.  He also has history of chronic atrial fibrillation, congestive heart failure with an EF of 30 to 35%.  Findings: Good LIMA.  Good vein graft.  The LAD was calcified.  Posterior lateral branch was quite small.  The diagonal and obtuse marginal were intramyocardial and good targets.  Patient was in sinus rhythm at the completion of the case.  Operative Technique: All invasive lines were placed in pre-op holding.  After the risks, benefits and alternatives were thoroughly discussed, the patient was brought to the operative theatre.  Anesthesia was induced, and the patient was prepped and draped in normal sterile fashion.  An appropriate surgical pause was performed, and pre-operative antibiotics were dosed accordingly.  We began with simultaneous incisions along the right  leg for harvesting of the greater saphenous vein and the chest for the sternotomy.  In regards to the sternotomy, this was carried down with bovie cautery, and the sternum was divided with a reciprocating saw.  Meticulous hemostasis was obtained.  The left internal thoracic artery was exposed and harvested in in pedicled fashion.  The patient was systemically heparinized, and the artery was divided distally, and placed in a papaverine sponge.    The sternal elevator was removed, and a retractor was placed.  The pericardium was divided in the midline and fashioned into a cradle with pericardial stitches.   After we confirmed an appropriate ACT, the ascending aorta was cannulated in standard fashion.  The right atrial appendage was used for venous cannulation site.  Cardiopulmonary bypass was initiated.  The transverse sinus was defined with blunt dissection.  There was a tract created underneath the superior vena cava, and the magnetic red rubber catheter was passed under it.  We then dissected out a tunnel underneath the inferior vena cava and passed the other end of the back at [facility] catheter.  The AtriCure clamp was then placed using the red rubber catheter to include both pulmonary veins and the atrium.  3 box lesion sets were created using the clamp with good impedance.  The clamp was removed  and then we continued with bypass surgery.  The heart retractor was placed. The cross clamp was applied, and a dose of anterograde cardioplegia was given with good arrest of the heart.  We moved to the posterior wall of the heart, and found a small target on the posterior lateral branch.  An arteriotomy was made, and the vein graft was anastomosed to it in an end to side fashion.  Next we exposed the lateral wall, and found a good target on the obtuse marginal #1.  An end to side anastomosis with the vein graft was then created.  Next, we exposed the anterior wall of the heart and identified a good target on first  diagonal.   An arteriotomy was created.  The vein was anastomosed in an end to side fashion.  We then measured the left atrial appendage and placed a 45 mm atrial clip along its base.  Finally, we exposed a good target on the LAD, and fashioned an end to side anastomosis between it and the LITA.  We began to re-warm, and a re-animation dose of cardioplegia was given.  The heart was de-aired, and the cross clamp was removed.  Meticulous hemostasis was obtained.    A partial occludding clamp was then placed on the ascending aorta, and we created an end to side anastomosis between it and the proximal vein grafts.  The proximal sites were marked with rings.  Prior to separated from cardiopulmonary bypass we cardioverted the heart and achieved sinus rhythm.  Hemostasis was obtained, and we separated from cardiopulmonary bypass without event.  The heparin was reversed with protamine.  Chest tubes and wires were placed, and the sternum was re-approximated with sternal wires.  The soft tissue and skin were re-approximated wth absorbable suture.    The patient tolerated the procedure without any immediate complications, and was transferred to the ICU in guarded condition.  Vidalia Serpas Bary Leriche

## 2021-02-26 NOTE — Anesthesia Preprocedure Evaluation (Addendum)
Anesthesia Evaluation  Patient identified by MRN, date of birth, ID band Patient awake    Reviewed: Allergy & Precautions, NPO status , Patient's Chart, lab work & pertinent test results, reviewed documented beta blocker date and time   Airway Mallampati: II  TM Distance: >3 FB Neck ROM: Full    Dental  (+) Missing,    Pulmonary sleep apnea , former smoker,    breath sounds clear to auscultation       Cardiovascular hypertension, Pt. on home beta blockers + CAD  + dysrhythmias Atrial Fibrillation  Rhythm:Regular Rate:Normal  Echo: 1. Left ventricular ejection fraction, by estimation, is 30 to 35%. The  left ventricle has moderately decreased function. The left ventricle  demonstrates global hypokinesis. There is moderate left ventricular  hypertrophy. Left ventricular diastolic  function could not be evaluated.  2. Right ventricular systolic function is normal. The right ventricular  size is mildly enlarged. There is normal pulmonary artery systolic  pressure.  3. Left atrial size was severely dilated.  4. Right atrial size was dilated.  5. The mitral valve is normal in structure. Mild mitral valve  regurgitation. No evidence of mitral stenosis.  6. The aortic valve is tricuspid. Aortic valve regurgitation is not  visualized.  7. There is mild dilatation of the ascending aorta, measuring 40 mm.    Neuro/Psych negative neurological ROS  negative psych ROS   GI/Hepatic Neg liver ROS, hiatal hernia, GERD  Medicated,  Endo/Other  diabetes, Type 2, Insulin Dependent, Oral Hypoglycemic Agents  Renal/GU negative Renal ROS     Musculoskeletal negative musculoskeletal ROS (+)   Abdominal Normal abdominal exam  (+)   Peds  Hematology negative hematology ROS (+)   Anesthesia Other Findings   Reproductive/Obstetrics                          Anesthesia Physical Anesthesia Plan  ASA:  4  Anesthesia Plan: General   Post-op Pain Management:    Induction: Intravenous  PONV Risk Score and Plan: 0 and Ondansetron  Airway Management Planned: Oral ETT  Additional Equipment: Arterial line, CVP, PA Cath, TEE and Ultrasound Guidance Line Placement  Intra-op Plan:   Post-operative Plan: Post-operative intubation/ventilation  Informed Consent: I have reviewed the patients History and Physical, chart, labs and discussed the procedure including the risks, benefits and alternatives for the proposed anesthesia with the patient or authorized representative who has indicated his/her understanding and acceptance.     Dental advisory given  Plan Discussed with: CRNA  Anesthesia Plan Comments:        Anesthesia Quick Evaluation

## 2021-02-26 NOTE — Discharge Instructions (Signed)

## 2021-02-26 NOTE — Progress Notes (Signed)
     [address]       [place] [address]             [phone]       No events overnight  Vitals:   02/25/21 2127 02/26/21 0509  BP: 137/69 124/88  Pulse: 90 87  Resp: 17 18  Temp: 97.8 F (36.6 C) 98.4 F (36.9 C)  SpO2: 97% 96%   Alert NAD Afib EWOB  OR today for CABG, MAZE, Atriclip  Lajuana Matte

## 2021-02-26 NOTE — Discharge Summary (Signed)
Physician Discharge Summary       Derek Bond.[address]       [place], [address]             [phone]    Patient ID: Derek Bond MRN: [id number] DOB/AGE: June 01, 1945 75 y.o.  Admit date: 02/21/2021 Discharge date: 03/04/2021  Admission Diagnoses: Atrial fibrillation with RVR ([place]) 2. Coronary artery disease 3. Ischemic cardiomyopathy  Discharge Diagnoses:  S/P CABG x 4 2. Acute on chronic HFrEF (heart failure with reduced ejection fraction) (HCC) 3. 4. History of essential hypertension 5. History of Hypercholesterolemia 6. History of Type 2 diabetes mellitus with complication, with long-term current use of insulin (HCC)  Consults: None  Procedure (s):   02/26/2021 Patient:  Derek Bond Pre-Op Dx: Coronary artery disease                         Congestive heart failure                         Chronic atrial fibrillation                         Diabetes mellitus                          Post-op Dx: Same Procedure: CABG X 4, LIMA to LAD, reverse saphenous vein graft to posterior lateral branch, reverse saphenous vein graft to obtuse marginal #1, reverse saphenous vein graft to first diagonal. Left atrial Maze procedure with a box lesion set using the AtriCure clamp. 45 mm atrial clip placement Endoscopic greater saphenous vein harvest on the right     Surgeon and Role:      * Lightfoot, Lucile Crater, MD - Primary    *D. Tacy Dura, PA-C- assisting   Anesthesia  general EBL: 1000 ml Blood Administration: None Xclamp Time:  69 min Pump Time:  188min   Drains: 12 F blake drain: L, mediastinal  Wires: Ventricular Counts: correct     Indications: This is a 75 year old gentleman that is admitted with new onset heart failure symptoms.  He underwent a left heart catheterization on which showed severe three-vessel coronary disease.  He also has history of chronic atrial fibrillation, congestive heart failure with an EF of 30 to 35%.     HPI: This is  a 75 year old with a past medical history of diabetes mellitus (type 2), hypertension, dyslipidemia, OSA, remote tobacco abuse, and malignant melanoma left side of face who was found to have heart failure in August. He was found to be in a fib on this admission. Work up revealed LVEF 25-30%, moderate MR and TR, and three vessel coronary artery disease. Further evaluation with left heart catheterization was recommended and had already been scheduled. However, Derek Bond presented to the emergency room at [facility] this past weekend with worsening shortness of breath and decreased energy. He reported having intermittent chest tightness.  Work-up in the emergency room yesterday included serial troponins which were flat.  There were no ischemic changes on EKG.  He was transferred to [facility] for further evaluation and management. Dr. Kipp Bond was consulted for the consideration of coronary artery bypass grafting surgery, LA clip, and MAZE. Potential risks, benefits, and complications of the surgery were discussed with the patient and he agreed to proceed with surgery. Pre operative carotid duplex US  showed no significant internal carotid artery stenosis bilaterally.  Hospital Course: Patient underwent a CABG x 4, LA clip, MAZE on 02/26/2021. Patient was transferred from the OR to [facility] ICU in stable condition.  Postoperative hospital course:  The patient initially did require some inotropic support as well as Neo-Synephrine for blood pressure.  He was maintained on low-dose dobutamine and on postoperative day #1 was also started on oral midodrine.  Early postoperatively he maintained sinus rhythm on amiodarone drip with plans to transition to oral dosing on postop day 2.  Eliquis will also be resumed.  He is noted to have a minor expected acute blood loss anemia which is stable and is being clinically monitored.  He is not in the transfusion threshold.  Renal function postoperatively has  remained normal.  He has good urine output.  Blood sugars are being managed using standard postoperative cardiac surgical protocols with plans to transition to home medication regimen over time.  He is noted to have somewhat poor diabetic control with a hemoglobin A1c of 8.1 preoperatively.  This will need to be followed closely as an outpatient by primary care.  He is tolerating gradually increasing activities using standard post cardiac surgical protocols.  Incisions are noted to be healing well without evidence of infection.  He is tolerating diet.  Oxygen is being weaned and he is maintaining good saturations.  Dobutamine has been discontinued on postoperative day #4.  He has been started on diuretic for expected postoperative volume overload.     Latest Vital Signs: Blood pressure (!) 146/74, pulse 69, temperature 98.2 F (36.8 C), temperature source Oral, resp. rate 20, height 6\' 6"  (1.981 m), weight 105.8 kg, SpO2 100 %.  Physical Exam:  General appearance: alert, cooperative, and no distress Heart: irregularly irregular rhythm Lungs: clear to auscultation bilaterally Abdomen: soft, non-tender; bowel sounds normal; no masses,  no organomegaly Extremities: 1-2+ pitting edema in bilateral lower ext Wound: clean and dry  Discharge Condition:Stable and discharged to home.  Recent laboratory studies:  Lab Results  Component Value Date   WBC 6.7 03/02/2021   HGB 12.9 (L) 03/02/2021   HCT 39.0 03/02/2021   MCV 93.3 03/02/2021   PLT 171 03/02/2021   Lab Results  Component Value Date   NA 131 (L) 03/02/2021   K 4.4 03/02/2021   CL 102 03/02/2021   CO2 23 03/02/2021   CREATININE 0.76 03/02/2021   GLUCOSE 174 (H) 03/02/2021      Diagnostic Studies: DG Chest 2 View  Result Date: 02/21/2021 CLINICAL DATA:  Chest pain and heaviness, nonradiating. Shortness of breath. Symptoms for 1 month. History coronary artery disease, former smoker, hypertension, melanoma, type II diabetes  mellitus EXAM: CHEST - 2 VIEW COMPARISON:  03/14/2021 FINDINGS: Enlargement of cardiac silhouette. Mediastinal contours and pulmonary vascularity normal. Lungs clear. No infiltrate, pleural effusion, or pneumothorax. Osseous structures unremarkable. IMPRESSION: Enlargement of cardiac silhouette. No acute abnormalities. Electronically Signed   By: Lavonia Dana M.D.   On: 02/21/2021 14:25   CARDIAC CATHETERIZATION  Addendum Date: 02/23/2021   LM: Normal LAD: Prox 80% stenosius at bifurcation with small to medium caliber D1 with 75% long prox disease. 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%, diabetic patient CVTS consult for CABG  Result Date: 02/23/2021 LM: Normal  LAD: Prox 80% stenosius at bifurcation with small to medium caliber D1 with 75& long prox disease Ramus: Minimal luminal irregularities Lcx: OM1 with prox 80% stenosis RCA: 60-70% disease in PL branches RA: 2 mmHg RV: 22/0 mmHg PA: 21/5 mmHg, mPAP 13 mmHg PCW: 8 mmHg CO: 5.6 L/min CI: 2.5 L/min/m2 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%, diabetic patient CVTS consult for CABG   DG Chest Port 1 View  Result Date: 03/01/2021 CLINICAL DATA:  Pneumothorax EXAM: PORTABLE CHEST 1 VIEW COMPARISON:  Chest x-rays dated 02/28/2021 and 02/27/2021. FINDINGS: Stable cardiomegaly. Median sternotomy wires in place. Swan-Ganz catheter has been removed. RIGHT IJ Cordis is stable in position. Stable opacity at the LEFT lung base, likely atelectasis and/or small pleural effusion. Lungs are otherwise clear. No pneumothorax is seen. IMPRESSION: 1. Stable opacity at the LEFT lung base, likely atelectasis and/or small pleural effusion. No new lung findings. No pneumothorax is seen. 2. Stable cardiomegaly. Electronically Signed   By: Franki Cabot M.D.   On: 03/01/2021 07:12   DG Chest Port 1 View  Result Date: 02/28/2021 CLINICAL DATA:  Chest pain EXAM: PORTABLE CHEST 1 VIEW COMPARISON:  1 day prior FINDINGS: Right IJ Swan-Ganz catheter tip at outflow tract. Median sternotomy with left atrial appendage occlusion device. Numerous leads and wires project over the chest. Midline trachea. Cardiomegaly accentuated by AP portable technique. Small left pleural effusion. No pneumothorax. Left hemidiaphragm elevation with persistent, slightly increased left base airspace disease. IMPRESSION: Worsened left base airspace disease, favoring atelectasis. Cardiomegaly without congestive failure. Similar small left pleural effusion. Electronically Signed   By: Abigail Miyamoto M.D.   On: 02/28/2021 08:37   DG Chest Port 1 View  Result Date: 02/27/2021 CLINICAL DATA:  Status post open heart surgery, chest tube present EXAM: PORTABLE CHEST 1 VIEW COMPARISON:  Chest radiograph 1 day prior FINDINGS: The endotracheal tube has been removed. The enteric catheter has been removed. A right IJ Swan-Ganz catheter is in stable position terminating in the proximal right pulmonary artery. Median sternotomy wires are stable. A left basilar chest tube is stable. The cardiomediastinal silhouette is stable. Dense retrocardiac opacity is again seen, slightly worsened in the interim. There is a trace left pleural effusion. The previously seen tiny left apical pneumothorax is not seen on the current study. The right lung is clear. There is no right pneumothorax. IMPRESSION: 1. Support devices as above. 2. Worsening opacity in the left base likely reflecting atelectasis. 3. The previously seen tiny left apical pneumothorax is no longer appreciated. Electronically Signed   By: Valetta Mole M.D.   On: 02/27/2021 08:55   DG Chest Port 1 View  Result Date: 02/26/2021 CLINICAL DATA:  Post open cardiac surgery EXAM: PORTABLE CHEST 1 VIEW COMPARISON:  02/21/2021 chest radiograph.  FINDINGS: Endotracheal tube tip is 6.6 cm above the carina. Enteric tube enters stomach with the tip not seen on this image. Right internal jugular Swan-Ganz catheter terminates in the main pulmonary artery. Intact sternotomy wires. Left-sided mediastinal drain and left basilar chest tube in place. Stable cardiomediastinal silhouette with top-normal heart size. Tiny left apical pneumothorax, less than 5%. No right pneumothorax. No pleural effusion. No pulmonary edema. Mild left basilar atelectasis. IMPRESSION: 1. Tiny left apical pneumothorax, less than 5%. Left chest tube in place. 2. Well-positioned support structures. 3. Mild left basilar atelectasis. Electronically Signed   By: Ilona Sorrel M.D.   On: 02/26/2021 14:47   ECHOCARDIOGRAM COMPLETE  Result Date: 02/25/2021    ECHOCARDIOGRAM REPORT  Patient Name:   Derek KNECHTEL Date of Exam: 02/25/2021 Medical Rec #:  [id number]       Height:       78.0 in Accession #:    [id number]      Weight:       206.1 lb Date of Birth:  04-Jun-1945        BSA:          2.287 m Patient Age:    29 years        BP:           125/78 mmHg Patient Gender: M               HR:           99 bpm. Exam Location:  Inpatient Procedure: 2D Echo, Cardiac Doppler and Color Doppler Indications:    CAD  History:        Patient has prior history of Echocardiogram examinations, most                 recent 02/02/2016. CHF, Arrythmias:Atrial Fibrillation; Risk                 Factors:Hypertension and Dyslipidemia.  Sonographer:    Wenda Low Referring Phys: [id number] SUNIT TOLIA IMPRESSIONS  1. Left ventricular ejection fraction, by estimation, is 30 to 35%. The left ventricle has moderately decreased function. The left ventricle demonstrates global hypokinesis. There is moderate left ventricular hypertrophy. Left ventricular diastolic function could not be evaluated.  2. Right ventricular systolic function is normal. The right ventricular size is mildly enlarged. There is normal pulmonary  artery systolic pressure.  3. Left atrial size was severely dilated.  4. Right atrial size was dilated.  5. The mitral valve is normal in structure. Mild mitral valve regurgitation. No evidence of mitral stenosis.  6. The aortic valve is tricuspid. Aortic valve regurgitation is not visualized.  7. There is mild dilatation of the ascending aorta, measuring 40 mm. FINDINGS  Left Ventricle: Left ventricular ejection fraction, by estimation, is 30 to 35%. The left ventricle has moderately decreased function. The left ventricle demonstrates global hypokinesis. Definity contrast agent was given IV to delineate the left ventricular endocardial borders. The left ventricular internal cavity size was normal in size. There is moderate left ventricular hypertrophy. Left ventricular diastolic function could not be evaluated due to atrial fibrillation. Left ventricular diastolic function could not be evaluated. Right Ventricle: The right ventricular size is mildly enlarged. No increase in right ventricular wall thickness. Right ventricular systolic function is normal. There is normal pulmonary artery systolic pressure. The tricuspid regurgitant velocity is 2.55  m/s, and with an assumed right atrial pressure of 8 mmHg, the estimated right ventricular systolic pressure is 30.8 mmHg. Left Atrium: Left atrial size was severely dilated. Right Atrium: Right atrial size was dilated. Pericardium: There is no evidence of pericardial effusion. Mitral Valve: The mitral valve is normal in structure. Mild mitral valve regurgitation, with centrally-directed jet. No evidence of mitral valve stenosis. MV peak gradient, 3.2 mmHg. The mean mitral valve gradient is 1.0 mmHg. Tricuspid Valve: The tricuspid valve is normal in structure. Tricuspid valve regurgitation is mild . No evidence of tricuspid stenosis. Aortic Valve: The aortic valve is tricuspid. Aortic valve regurgitation is not visualized. Aortic valve mean gradient measures 7.0 mmHg.  Aortic valve peak gradient measures 11.7 mmHg. Aortic valve area, by VTI measures 1.67 cm. Pulmonic Valve: The pulmonic valve was normal in structure. Pulmonic valve regurgitation is  trivial. No evidence of pulmonic stenosis. Aorta: The aortic root is normal in size and structure. There is mild dilatation of the ascending aorta, measuring 40 mm. Venous: The inferior vena cava was not well visualized. IAS/Shunts: The atrial septum is grossly normal.  LEFT VENTRICLE PLAX 2D LVIDd:         4.60 cm      Diastology LVIDs:         3.60 cm      LV e' medial:    4.33 cm/s LV PW:         1.50 cm      LV E/e' medial:  15.3 LV IVS:        1.60 cm      LV e' lateral:   7.49 cm/s LVOT diam:     2.20 cm      LV E/e' lateral: 8.9 LV SV:         57 LV SV Index:   25 LVOT Area:     3.80 cm  LV Volumes (MOD) LV vol d, MOD A4C: 146.0 ml LV vol s, MOD A4C: 101.0 ml LV SV MOD A4C:     146.0 ml RIGHT VENTRICLE RV Basal diam:  4.90 cm RV Mid diam:    4.80 cm RV S prime:     10.10 cm/s TAPSE (M-mode): 2.6 cm LEFT ATRIUM              Index        RIGHT ATRIUM           Index LA diam:        4.20 cm  1.84 cm/m   RA Area:     31.20 cm LA Vol (A2C):   123.0 ml 53.79 ml/m  RA Volume:   122.00 ml 53.36 ml/m LA Vol (A4C):   105.0 ml 45.92 ml/m LA Biplane Vol: 119.0 ml 52.04 ml/m  AORTIC VALVE                     PULMONIC VALVE AV Area (Vmax):    1.80 cm      PV Vmax:       0.66 m/s AV Area (Vmean):   1.76 cm      PV Peak grad:  1.7 mmHg AV Area (VTI):     1.67 cm AV Vmax:           171.00 cm/s AV Vmean:          124.000 cm/s AV VTI:            0.344 m AV Peak Grad:      11.7 mmHg AV Mean Grad:      7.0 mmHg LVOT Vmax:         81.10 cm/s LVOT Vmean:        57.300 cm/s LVOT VTI:          0.151 m LVOT/AV VTI ratio: 0.44  AORTA Ao Root diam: 3.70 cm Ao Asc diam:  4.00 cm MITRAL VALVE               TRICUSPID VALVE MV Area (PHT): 2.63 cm    TR Peak grad:   26.0 mmHg MV Area VTI:   2.68 cm    TR Vmax:        255.00 cm/s MV Peak grad:  3.2  mmHg MV Mean grad:  1.0 mmHg    SHUNTS MV Vmax:       0.89 m/s  Systemic VTI:  0.15 m MV Vmean:      50.1 cm/s   Systemic Diam: 2.20 cm MV Decel Time: 288 msec MV E velocity: 66.40 cm/s Sunit Tolia DO Electronically signed by Rex Kras DO Signature Date/Time: 02/25/2021/3:07:07 PM    Final    ECHO INTRAOPERATIVE TEE  Result Date: 02/26/2021  *INTRAOPERATIVE TRANSESOPHAGEAL REPORT *  Patient Name:   Derek Bond Date of Exam: 02/26/2021 Medical Rec #:  [id number]       Height:       78.0 in Accession #:    [id number]      Weight:       206.5 lb Date of Birth:  11-04-1945        BSA:          2.29 m Patient Age:    32 years        BP:           125/68 mmHg Patient Gender: M               HR:           73 bpm. Exam Location:  Anesthesiology Transesophogeal exam was perform intraoperatively during surgical procedure. Patient was closely monitored under general anesthesia during the entirety of examination. Indications:     CAD, Atrial Fibrillation Performing Phys: Suella Broad MD Complications: No known complications during this procedure.             POST-OP IMPRESSIONS s/p CABG (x4), MAZE & atrial clip placement _ Left Ventricle: LVEF improved. Septal, inferoseptal and inferior wall showing improved function. CO > 5L/min. _ Right Ventricle: The right ventricle appears unchanged from pre-bypass. _ Aorta: Aorta unchanged, no dissection noted after cannula removed. _ Left Atrium: Minimal dilation. _ Left Atrial Appendage: No longer patent, 48mm Atrial clip in place. _ Aortic Valve: The aortic valve appears unchanged from pre-bypass. _ Mitral Valve: The mitral valve appears unchanged from pre-bypass. _ Tricuspid Valve: The tricuspid valve appears unchanged from pre-bypass. _ Pulmonic Valve: The pulmonic valve appears unchanged from pre-bypass. _ Interatrial Septum: The interatrial septum appears unchanged from pre-bypass. _ Interventricular Septum: The interventricular septum appears unchanged from pre-bypass. _  Pericardium: No pericardial effusion noted. PRE-OP FINDINGS  Left Ventricle: The left ventricle has moderately reduced systolic function, with an ejection fraction of 35-40%. The cavity size was mildly dilated. Left ventricular diffuse hypokinesis. Moderate hypokinesis of the entire septal wall, inferoseptal wall  and inferior wall. Mild hypokinesis of the left ventricular, entire anterolateral wall, lateral wall and anterior wall. There is no left ventricular hypertrophy. Left ventricular diastolic function could not be evaluated. Right Ventricle: The right ventricle has normal systolic function. The cavity was normal. There is no increase in right ventricular wall thickness. There is no aneurysm seen. PA catheter traversing the RV into the main pulmonary artery. Left Atrium: Left atrial size was dilated. No left atrial/left atrial appendage thrombus was detected. The left atrial appendage is well visualized and there is no evidence of thrombus present. Right Atrium: Right atrial size was dilated. PA catheter traversing the right atrium into the right ventricle. Interatrial Septum: No atrial level shunt detected by color flow Doppler. There is no evidence of a patent foramen ovale. Pericardium: Trivial pericardial effusion is present. The pericardial effusion is circumferential. There is no pleural effusion. Mitral Valve: The mitral valve is normal in structure. Mitral valve regurgitation is mild by color flow Doppler. The MR jet is centrally-directed. There is no evidence of mitral  valve vegetation. There is no evidence of mitral stenosis. Tricuspid Valve: The tricuspid valve was normal in structure. Tricuspid valve regurgitation was not visualized by color flow Doppler. No evidence of tricuspid stenosis is present. There is no evidence of tricuspid valve vegetation. Aortic Valve: The aortic valve is tricuspid aortic valve regurgitation was not visualized by color flow Doppler. There is no stenosis of the aortic  valve with a mean gradient of 8 mmhg. There is no evidence of aortic valve vegetation. There is moderate thickening and moderate calcification present on the aortic valve right coronary, left coronary and non-coronary cusps with moderately decreased mobility. Pulmonic Valve: The pulmonic valve was normal in structure. Pulmonic valve regurgitation is not visualized by color flow Doppler. Aorta: The ascending aorta and aortic arch are normal in size and structure. There is moderate dilatation of the aortic root, measuring 40 mm. Pulmonary Artery: Gordy Councilman catheter present on the right. The pulmonary artery is of normal size. Venous: The inferior vena cava was not well visualized. Shunts: There is no evidence of an atrial septal defect. +--------------+-------++ LEFT VENTRICLE        +--------------+-------++ PLAX 2D               +--------------+-------++ LVIDd:        6.20 cm +--------------+-------++ LVIDs:        4.90 cm +--------------+-------++ LV SV:        81 ml   +--------------+-------++ LV SV Index:  35.78   +--------------+-------++                       +--------------+-------++ +------------------+------------++ AORTIC VALVE                   +------------------+------------++ AV Vmax:          187.00 cm/s  +------------------+------------++ AV Vmean:         128.000 cm/s +------------------+------------++ AV VTI:           0.461 m      +------------------+------------++ AV Peak Grad:     14.0 mmHg    +------------------+------------++ AV Mean Grad:     8.0 mmHg     +------------------+------------++ LVOT Vmax:        159.00 cm/s  +------------------+------------++ LVOT Vmean:       107.000 cm/s +------------------+------------++ LVOT VTI:         0.321 m      +------------------+------------++ LVOT/AV VTI ratio:0.70         +------------------+------------++ +--------------+-----------++ MITRAL VALVE                +-------------+------+ +--------------+-----------++  SHUNTS              MV Area (PHT):1.32 cm     +-------------+------+ +--------------+-----------++  Systemic VTI:0.32 m MV PHT:       167.00 msec  +-------------+------+ +--------------+-----------++  Suella Broad MD Electronically signed by Suella Broad MD Signature Date/Time: 02/26/2021/3:56:40 PM    Final    VAS US DOPPLER PRE CABG  Result Date: 02/25/2021 PREOPERATIVE VASCULAR EVALUATION Patient Name:  Derek Bond  Date of Exam:   02/24/2021 Medical Rec #: [id number]        Accession #:    [id number] Date of Birth: Mar 12, 1946         Patient Gender: M Patient Age:   34 years Exam Location:  [facility] Procedure:      VAS US DOPPLER PRE CABG Referring Phys: HARRELL LIGHTFOOT --------------------------------------------------------------------------------  Indications:      Pre-CABG. Risk Factors:     Hypertension, Diabetes, coronary artery disease. Comparison Study: No prior study Performing Technologist: Maudry Mayhew MHA, RVT, RDCS, RDMS  Examination Guidelines: A complete evaluation includes B-mode imaging, spectral Doppler, color Doppler, and power Doppler as needed of all accessible portions of each vessel. Bilateral testing is considered an integral part of a complete examination. Limited examinations for reoccurring indications may be performed as noted.  Right Carotid Findings: +----------+--------+--------+--------+-----------------------+--------+           PSV cm/sEDV cm/sStenosisDescribe               Comments +----------+--------+--------+--------+-----------------------+--------+ CCA Prox  52      11              smooth and heterogenous         +----------+--------+--------+--------+-----------------------+--------+ CCA Distal74      10              heterogenous and smooth         +----------+--------+--------+--------+-----------------------+--------+ ICA Prox  57      13               irregular and calcific          +----------+--------+--------+--------+-----------------------+--------+ ICA Distal83      14                                              +----------+--------+--------+--------+-----------------------+--------+ ECA       80                                                      +----------+--------+--------+--------+-----------------------+--------+ +----------+--------+-------+----------------+------------+           PSV cm/sEDV cmsDescribe        Arm Pressure +----------+--------+-------+----------------+------------+ [id number]             Multiphasic, WNL             +----------+--------+-------+----------------+------------+ +---------+--------+--+--------+-+---------+ VertebralPSV cm/s31EDV cm/s6Antegrade +---------+--------+--+--------+-+---------+ Left Carotid Findings: +----------+--------+--------+--------+-------------------------+--------+           PSV cm/sEDV cm/sStenosisDescribe                 Comments +----------+--------+--------+--------+-------------------------+--------+ CCA Prox  86      8                                                 +----------+--------+--------+--------+-------------------------+--------+ CCA Distal54      15              smooth and heterogenous           +----------+--------+--------+--------+-------------------------+--------+ ICA Prox  79      11              heterogenous and calcific         +----------+--------+--------+--------+-------------------------+--------+ ICA Distal65      25                                                +----------+--------+--------+--------+-------------------------+--------+  ECA       116                     smooth and heterogenous           +----------+--------+--------+--------+-------------------------+--------+ +----------+--------+--------+----------------+------------+ SubclavianPSV cm/sEDV cm/sDescribe        Arm  Pressure +----------+--------+--------+----------------+------------+           132             Multiphasic, WNL             +----------+--------+--------+----------------+------------+ +---------+--------+--+--------+--+---------+ VertebralPSV cm/s42EDV cm/s13Antegrade +---------+--------+--+--------+--+---------+  ABI Findings: +---------+------------------+-----+----------+--------+ Right    Rt Pressure (mmHg)IndexWaveform  Comment  +---------+------------------+-----+----------+--------+ Brachial 130                    triphasic          +---------+------------------+-----+----------+--------+ PTA      131               1.01 biphasic           +---------+------------------+-----+----------+--------+ DP       104               0.80 monophasic         +---------+------------------+-----+----------+--------+ Great Toe146               1.12                    +---------+------------------+-----+----------+--------+ +---------+------------------+-----+---------+-------+ Left     Lt Pressure (mmHg)IndexWaveform Comment +---------+------------------+-----+---------+-------+ Brachial 124                    triphasic        +---------+------------------+-----+---------+-------+ PTA      118               0.91 biphasic         +---------+------------------+-----+---------+-------+ DP       112               0.86 biphasic         +---------+------------------+-----+---------+-------+ Great Toe61                0.47                  +---------+------------------+-----+---------+-------+ +-------+---------------+----------------+ ABI/TBIToday's ABI/TBIPrevious ABI/TBI +-------+---------------+----------------+ Right  1.01/1.12                       +-------+---------------+----------------+ Left   0.91/0.47                       +-------+---------------+----------------+  Right Doppler Findings:  +-----------+--------+-----+---------+----------------------------------------+ Site       PressureIndexDoppler  Comments                                 +-----------+--------+-----+---------+----------------------------------------+ Brachial   130          triphasic                                         +-----------+--------+-----+---------+----------------------------------------+ Radial                  triphasic                                         +-----------+--------+-----+---------+----------------------------------------+  Ulnar                   triphasic                                         +-----------+--------+-----+---------+----------------------------------------+ Palmar Arch                      Unable to adequately assess due to                                        arrhythmia                               +-----------+--------+-----+---------+----------------------------------------+  Left Doppler Findings: +--------+--------+-----+---------+-------------------------------------------+ Site    PressureIndexDoppler  Comments                                    +--------+--------+-----+---------+-------------------------------------------+ [id number]          triphasic                                            +--------+--------+-----+---------+-------------------------------------------+ Radial               triphasic                                            +--------+--------+-----+---------+-------------------------------------------+ Ulnar                triphasic                                            +--------+--------+-----+---------+-------------------------------------------+ Digit                         Unable to adequately assess due to                                        arrhythmia                                  +--------+--------+-----+---------+-------------------------------------------+   Summary: Right Carotid: Velocities in the right ICA are consistent with a 1-39% stenosis. Left Carotid: Velocities in the left ICA are consistent with a 1-39% stenosis. Vertebrals:  Bilateral vertebral arteries demonstrate antegrade flow. Subclavians: Normal flow hemodynamics were seen in bilateral subclavian              arteries. Right ABI: Resting right ankle-brachial index is within normal range. No evidence of significant right lower extremity arterial disease. The right toe-brachial index is normal. 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 The left toe-brachial index is abnormal.  Electronically signed by Harold Barban  MD on 02/25/2021 at 9:22:21 PM.    Final    PCV ECHOCARDIOGRAM COMPLETE  Result Date: 02/08/2021 Echocardiogram 02/05/2021: Severely depressed LV systolic function with visual EF 25-30%. Hypokinetic global wall motion with regionality involving septal wall. Left ventricle cavity is mildly dilated. Moderate left ventricular hypertrophy. Unable to evaluate diastolic function due to atrial fibrillation. Elevated LAP. Moderate (Grade II) mitral regurgitation. Moderate tricuspid regurgitation. Mild pulmonary hypertension, RVSP 34mmHg. IVC is dilated with a respiratory response of <50%. No prior study for comparison.      Discharge Instructions     Amb Referral to Cardiac Rehabilitation   Complete by: As directed    Diagnosis: CABG   CABG X ___: 4   After initial evaluation and assessments completed: Virtual Based Care may be provided alone or in conjunction with Phase 2 Cardiac Rehab based on patient barriers.: Yes   Amb Referral to Nutrition and Diabetic Education   Complete by: As directed    Amb referral to AFIB Clinic   Complete by: As directed    Care order/instruction   Complete by: As directed    diabetes coordinator to provide FreeStyle Libre2       Discharge Medications: Allergies as of 03/04/2021   No  Known Allergies      Medication List     STOP taking these medications    Entresto 49-51 MG Generic drug: sacubitril-valsartan   metoprolol tartrate 25 MG tablet Commonly known as: LOPRESSOR   nitroGLYCERIN 0.4 MG SL tablet Commonly known as: NITROSTAT       TAKE these medications    acetaminophen 500 MG tablet Commonly known as: TYLENOL Take 1,000 mg by mouth every 6 (six) hours as needed for moderate pain or mild pain.   amiodarone 200 MG tablet Commonly known as: PACERONE Take 2 tablets (400 mg total) by mouth 2 (two) times daily. Please take 2 tabs (400mg ) twice a day for 3 days then take 1 tab (200mg ) twice a day until we see you in follow-up.   apixaban 5 MG Tabs tablet Commonly known as: ELIQUIS Take 1 tablet (5 mg total) by mouth 2 (two) times daily.   aspirin EC 81 MG tablet Take 81 mg by mouth daily.   carvedilol 3.125 MG tablet Commonly known as: COREG Take 1 tablet (3.125 mg total) by mouth 2 (two) times daily with a meal.   dapagliflozin propanediol 10 MG Tabs tablet Commonly known as: Farxiga Take 1 tablet (10 mg total) by mouth daily before breakfast.   furosemide 40 MG tablet Commonly known as: LASIX Take 1 tablet (40 mg total) by mouth daily. Start taking on: March 05, 2021 What changed:  medication strength how much to take   glipiZIDE 10 MG 24 hr tablet Commonly known as: GLUCOTROL XL Take 10 mg by mouth 2 (two) times daily.   Magnesium Oxide 400 MG Caps Take 1 capsule (400 mg total) by mouth in the morning and at bedtime. What changed: when to take this   metFORMIN 1000 MG tablet Commonly known as: GLUCOPHAGE Take 1,000 mg by mouth 2 (two) times daily with a meal.   pantoprazole 40 MG tablet Commonly known as: PROTONIX Take 40 mg by mouth daily.   potassium chloride SA 20 MEQ tablet Commonly known as: KLOR-CON Take 1 tablet (20 mEq total) by mouth daily. Start taking on: March 05, 2021   rosuvastatin 5 MG  tablet Commonly known as: CRESTOR Take 5 mg by mouth at bedtime.   traMADol 50  MG tablet Commonly known as: ULTRAM Take 1 tablet (50 mg total) by mouth every 4 (four) hours as needed for moderate pain.   [name] FlexTouch 100 UNIT/ML FlexTouch Pen Generic drug: insulin degludec Inject 25 Units into the skin daily. What changed:  medication strength how much to take               Durable Medical Equipment  (From admission, onward)           Start     Ordered   03/04/21 0937  For home use only DME Shower stool  Once        03/04/21 0936   03/04/21 0908  For home use only DME Bedside commode  Once       Question:  Patient needs a bedside commode to treat with the following condition  Answer:  Debility   03/04/21 0907   03/03/21 1300  For home use only DME Walker rolling  Once       Question Answer Comment  Walker: With [place]   Patient needs a walker to treat with the following condition Weakness      03/03/21 1300           The patient has been discharged on:   1.Beta Blocker:  Yes [  yes ]                              No   [   ]                              If No, reason:  2.Ace Inhibitor/ARB: Yes [   ]                                     No  [  no  ]                                     If No, reason: BP controlled  3.Statin:   Yes [ yes  ]                  No  [   ]                  If No, reason:  4.Ecasa:  Yes  [ yes  ]                  No   [   ]                  If No, reason:  Patient had ACS upon admission:  Plavix/P2Y12 inhibitor: Yes [ yes  ]                                      No  [   ]   Follow Up Appointments:  Follow-up Information     Tolia, Sunit, DO. Go on 03/13/2021.   Specialties: Cardiology, Vascular Surgery Why: Appointment time is at 10:45 am Contact information: [place] [address] [phone]  Lajuana Matte, MD Follow up on 03/06/2021.   Specialty:  Cardiothoracic Surgery Why: Dr. Kipp Bond will call you at 3:30 pm. Please do NOT go to the office. Contact information: [address] [place] [address] [phone]         Glenda Chroman, MD. Call.   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   Why: They will call to setup an apt Contact information: [place] [address] [phone]                 Signed: Terance Hart ContePA-C 03/04/2021, 10:29 AM

## 2021-02-26 NOTE — Anesthesia Procedure Notes (Signed)
Arterial Line Insertion Start/End10/13/2022 6:50 AM, 02/26/2021 7:15 AM Performed by: Leonor Liv, CRNA, CRNA  Patient location: Pre-op. Preanesthetic checklist: patient identified, IV checked, site marked, risks and benefits discussed, surgical consent, monitors and equipment checked, pre-op evaluation, timeout performed and anesthesia consent Lidocaine 1% used for infiltration and patient sedated Left, radial was placed Catheter size: 20 G Hand hygiene performed , maximum sterile barriers used  and Seldinger technique used Allen's test indicative of satisfactory collateral circulation Attempts: 3 Procedure performed without using ultrasound guided technique. Following insertion, dressing applied and Biopatch. Patient tolerated the procedure well with no immediate complications. Additional procedure comments: Scar tissue encountered during first two attempts, sluggish blood flow. 3rd attempt was successful, more proximal, appropriate waveform and withdraws blood well. Marland Kitchen

## 2021-02-26 NOTE — Anesthesia Procedure Notes (Signed)
Procedure Name: Intubation Date/Time: 02/26/2021 7:56 AM Performed by: Justin Mend, RN Pre-anesthesia Checklist: Patient identified, Emergency Drugs available, Suction available and Patient being monitored Patient Re-evaluated:Patient Re-evaluated prior to induction Oxygen Delivery Method: Circle System Utilized Preoxygenation: Pre-oxygenation with 100% oxygen Induction Type: IV induction Ventilation: Mask ventilation without difficulty Laryngoscope Size: Mac and 4 Grade View: Grade II Tube type: Oral Tube size: 8.0 mm Number of attempts: 1 Airway Equipment and Method: Stylet and Oral airway Placement Confirmation: ETT inserted through vocal cords under direct vision, positive ETCO2 and breath sounds checked- equal and bilateral Secured at: 21 cm Tube secured with: Tape Dental Injury: Teeth and Oropharynx as per pre-operative assessment

## 2021-02-26 NOTE — Progress Notes (Signed)
      Derek Bond       Derek Bond,Derek Bond             [phone]      S/p CABG, Maze  Still intubated  BP 110/67   Pulse 73   Temp 98.1 F (36.7 C)   Resp 12   Ht 6\' 6"  (1.981 m)   Wt 93.7 kg   SpO2 100%   BMI 23.86 kg/m  Ci 2.3 by Flotrac on dobutamine 2.5 33/14 CBG well controlled   Intake/Output Summary (Last 24 hours) at 02/26/2021 1840 Last data filed at 02/26/2021 1800 Gross per 24 hour  Intake 3867.72 ml  Output 2772 ml  Net 1095.72 ml   Hct= 40, K= 4.2  Doing well early postop  Derek Lipps C. Roxan Hockey, MD [facility] [phone]

## 2021-02-26 NOTE — Anesthesia Procedure Notes (Addendum)
  Central Venous Catheter Insertion Performed by: anesthesiologist Start/End10/13/2022 6:50 AM, 02/26/2021 7:05 AM Patient location: Pre-op. Preanesthetic checklist: patient identified, IV checked, site marked, risks and benefits discussed, surgical consent, monitors and equipment checked, pre-op evaluation, timeout performed and anesthesia consent Position: Trendelenburg Lidocaine 1% used for infiltration and patient sedated Hand hygiene performed  and maximum sterile barriers used  Catheter size: 8.5 Fr MAC introducer Procedure performed using ultrasound guided technique. Ultrasound Notes:anatomy identified, needle tip was noted to be adjacent to the nerve/plexus identified, no ultrasound evidence of intravascular and/or intraneural injection and image(s) printed for medical record Attempts: 1 Following insertion, line sutured and dressing applied. Post procedure assessment: blood return through all ports, free fluid flow and no air  Patient tolerated the procedure well with no immediate complications. Additional procedure comments: Double lumen through the introducer.

## 2021-02-26 NOTE — Brief Op Note (Signed)
02/21/2021 - 02/26/2021  11:51 AM  PATIENT:  Derek Bond  76 y.o. male  PRE-OPERATIVE DIAGNOSIS:  1. CORONARY ARTERY DISEASE 2. ATRIAL FIBRILLATION,  POST-OPERATIVE DIAGNOSIS:  1. CORONARY ARTERY DISEASE 2. ATRIAL FIBRILLATION  PROCEDURE: TRANSESOPHAGEAL ECHOCARDIOGRAM (TEE), CORONARY ARTERY BYPASS GRAFTING (CABG) ON PUMPx 4 (LIMA to LAD, SVG to DIAGONAL, SVG to OM, SVG to PLB) USING LEFT INTERNAL MAMMARY ARTERY AND RIGHT ENDOSCOPICALLY HARVESTED GREATER SAPHENOUS VEIN, CLIPPING OF LEFT ATRIAL APPENDAGE  MAZE, APPLICATION OF CELL SAVER  RIGHT EVH HARVEST TIME: 39 minutes;RIGHT EVH PREP TIME: 15 minutes  SURGEON:  Surgeon(s) and Role:    Lightfoot, Lucile Crater, MD - Primary  PHYSICIAN ASSISTANT: Lars Pinks PA-C  ASSISTANTS: Malory Hardy RNFA   ANESTHESIA:   general  EBL: Per anesthesia, perfusion record  DRAINS:  Chest tubes placed in the mediastinal and pleural spaces    COUNTS CORRECT:  YES  DICTATION: .Dragon Dictation  PLAN OF CARE: Admit to inpatient   PATIENT DISPOSITION:  ICU - intubated and hemodynamically stable.   Delay start of Pharmacological VTE agent (>24hrs) due to surgical blood loss or risk of bleeding: yes  BASELINE WEIGHT: 93.7 kg

## 2021-02-27 ENCOUNTER — Inpatient Hospital Stay (HOSPITAL_COMMUNITY): Payer: Medicare PPO

## 2021-02-27 ENCOUNTER — Encounter (HOSPITAL_COMMUNITY): Payer: Self-pay | Admitting: Thoracic Surgery (Cardiothoracic Vascular Surgery)

## 2021-02-27 ENCOUNTER — Other Ambulatory Visit (HOSPITAL_COMMUNITY): Payer: Self-pay

## 2021-02-27 DIAGNOSIS — I5023 Acute on chronic systolic (congestive) heart failure: Secondary | ICD-10-CM

## 2021-02-27 DIAGNOSIS — I200 Unstable angina: Secondary | ICD-10-CM

## 2021-02-27 DIAGNOSIS — I2510 Atherosclerotic heart disease of native coronary artery without angina pectoris: Secondary | ICD-10-CM | POA: Diagnosis not present

## 2021-02-27 LAB — CBC
HCT: 35.1 % — ABNORMAL LOW (ref 39.0–52.0)
HCT: 34.3 % — ABNORMAL LOW (ref 39.0–52.0)
Hemoglobin: 11.4 g/dL — ABNORMAL LOW (ref 13.0–17.0)
Hemoglobin: 11.2 g/dL — ABNORMAL LOW (ref 13.0–17.0)
MCH: 30.3 pg (ref 26.0–34.0)
MCH: 30.7 pg (ref 26.0–34.0)
MCHC: 32.5 g/dL (ref 30.0–36.0)
MCHC: 32.7 g/dL (ref 30.0–36.0)
MCV: 93.4 fL (ref 80.0–100.0)
MCV: 94 fL (ref 80.0–100.0)
Platelets: 155 10*3/uL (ref 150–400)
Platelets: 126 10*3/uL — ABNORMAL LOW (ref 150–400)
RBC: 3.76 MIL/uL — ABNORMAL LOW (ref 4.22–5.81)
RBC: 3.65 MIL/uL — ABNORMAL LOW (ref 4.22–5.81)
RDW: 15.7 % — ABNORMAL HIGH (ref 11.5–15.5)
RDW: 15.9 % — ABNORMAL HIGH (ref 11.5–15.5)
WBC: 9.5 10*3/uL (ref 4.0–10.5)
WBC: 10.1 10*3/uL (ref 4.0–10.5)
nRBC: 0 % (ref 0.0–0.2)
nRBC: 0 % (ref 0.0–0.2)

## 2021-02-27 LAB — BASIC METABOLIC PANEL
Anion gap: 10 (ref 5–15)
Anion gap: 8 (ref 5–15)
BUN: 9 mg/dL (ref 8–23)
BUN: 12 mg/dL (ref 8–23)
CO2: 19 mmol/L — ABNORMAL LOW (ref 22–32)
CO2: 21 mmol/L — ABNORMAL LOW (ref 22–32)
Calcium: 8.1 mg/dL — ABNORMAL LOW (ref 8.9–10.3)
Calcium: 8.2 mg/dL — ABNORMAL LOW (ref 8.9–10.3)
Chloride: 107 mmol/L (ref 98–111)
Chloride: 104 mmol/L (ref 98–111)
Creatinine, Ser: 0.83 mg/dL (ref 0.61–1.24)
Creatinine, Ser: 0.88 mg/dL (ref 0.61–1.24)
GFR, Estimated: >60 mL/min (ref >60)
GFR, Estimated: >60 mL/min (ref >60)
Glucose, Bld: 114 mg/dL — ABNORMAL HIGH (ref 70–99)
Glucose, Bld: 177 mg/dL — ABNORMAL HIGH (ref 70–99)
Potassium: 4.5 mmol/L (ref 3.5–5.1)
Potassium: 4.8 mmol/L (ref 3.5–5.1)
Sodium: 136 mmol/L (ref 135–145)
Sodium: 133 mmol/L — ABNORMAL LOW (ref 135–145)

## 2021-02-27 LAB — POCT I-STAT 7, (LYTES, BLD GAS, ICA,H+H)
Acid-base deficit: 7 mmol/L — ABNORMAL HIGH (ref 0.0–2.0)
Bicarbonate: 18.2 mmol/L — ABNORMAL LOW (ref 20.0–28.0)
Calcium, Ion: 1.16 mmol/L (ref 1.15–1.40)
HCT: 33 % — ABNORMAL LOW (ref 39.0–52.0)
Hemoglobin: 11.2 g/dL — ABNORMAL LOW (ref 13.0–17.0)
O2 Saturation: 99 %
Patient temperature: 37.2
Potassium: 4.2 mmol/L (ref 3.5–5.1)
Sodium: 139 mmol/L (ref 135–145)
TCO2: 19 mmol/L — ABNORMAL LOW (ref 22–32)
pCO2 arterial: 33.9 mmHg (ref 32.0–48.0)
pH, Arterial: 7.339 — ABNORMAL LOW (ref 7.350–7.450)
pO2, Arterial: 132 mmHg — ABNORMAL HIGH (ref 83.0–108.0)

## 2021-02-27 LAB — GLUCOSE, CAPILLARY
Glucose-Capillary: 107 mg/dL — ABNORMAL HIGH (ref 70–99)
Glucose-Capillary: 116 mg/dL — ABNORMAL HIGH (ref 70–99)
Glucose-Capillary: 117 mg/dL — ABNORMAL HIGH (ref 70–99)
Glucose-Capillary: 118 mg/dL — ABNORMAL HIGH (ref 70–99)
Glucose-Capillary: 120 mg/dL — ABNORMAL HIGH (ref 70–99)
Glucose-Capillary: 136 mg/dL — ABNORMAL HIGH (ref 70–99)
Glucose-Capillary: 157 mg/dL — ABNORMAL HIGH (ref 70–99)
Glucose-Capillary: 232 mg/dL — ABNORMAL HIGH (ref 70–99)
Glucose-Capillary: 77 mg/dL (ref 70–99)
Glucose-Capillary: 95 mg/dL (ref 70–99)

## 2021-02-27 LAB — MAGNESIUM
Magnesium: 2.4 mg/dL (ref 1.7–2.4)
Magnesium: 2.3 mg/dL (ref 1.7–2.4)

## 2021-02-27 MED ORDER — INSULIN ASPART 100 UNIT/ML IJ SOLN
0.0000 [IU] | INTRAMUSCULAR | Status: DC
  Administered 2021-02-27 (×2): 2 [IU] via SUBCUTANEOUS
  Administered 2021-02-27: 8 [IU] via SUBCUTANEOUS
  Administered 2021-02-27: 2 [IU] via SUBCUTANEOUS
  Administered 2021-02-28: 4 [IU] via SUBCUTANEOUS
  Administered 2021-02-28: 2 [IU] via SUBCUTANEOUS
  Administered 2021-02-28: 4 [IU] via SUBCUTANEOUS
  Administered 2021-02-28: 8 [IU] via SUBCUTANEOUS
  Administered 2021-02-28 (×2): 4 [IU] via SUBCUTANEOUS
  Administered 2021-02-28 – 2021-03-01 (×2): 2 [IU] via SUBCUTANEOUS
  Administered 2021-03-01: 4 [IU] via SUBCUTANEOUS

## 2021-02-27 MED ORDER — MIDODRINE HCL 5 MG PO TABS
10.0000 mg | ORAL_TABLET | Freq: Three times a day (TID) | ORAL | Status: DC
  Administered 2021-02-27 – 2021-03-01 (×7): 10 mg via ORAL
  Filled 2021-02-27 (×7): qty 2

## 2021-02-27 MED ORDER — LIDOCAINE 5 % EX PTCH
2.0000 | MEDICATED_PATCH | CUTANEOUS | Status: DC
  Administered 2021-02-27 – 2021-03-02 (×4): 2 via TRANSDERMAL
  Filled 2021-02-27 (×6): qty 2

## 2021-02-27 MED ORDER — METHOCARBAMOL 500 MG PO TABS
500.0000 mg | ORAL_TABLET | Freq: Three times a day (TID) | ORAL | Status: AC
  Administered 2021-02-27 – 2021-03-02 (×9): 500 mg via ORAL
  Filled 2021-02-27 (×10): qty 1

## 2021-02-27 NOTE — Progress Notes (Addendum)
Advanced Heart Failure Rounding Note  PCP-Cardiologist: Dr. Terri Skains   Subjective:    10/13: s/p CABG x4 + MAZE + LAA Clip. Intraoperative TEE EF 35-40%. RV normal   Extubated last PM   POD# 1. Looks great. Feels ok. Hemodynamics good.  On NEO 60, DBA 2.5, amio gtt 30   Swan #s CVP 8-9 PAP 34/15 (23) CI 3.17 CO 7.27  SVR 812 ([facility])    Objective:   Weight Range: 93.7 kg Body mass index is 23.86 kg/m.   Vital Signs:   Temp:  [97.7 F (36.5 C)-99.1 F (37.3 C)] 98.8 F (37.1 C) (10/14 0630) Pulse Rate:  [68-95] 91 (10/14 0630) Resp:  [12-26] 17 (10/14 0630) BP: (100-131)/(54-78) 122/69 (10/14 0600) SpO2:  [95 %-100 %] 95 % (10/14 0630) Arterial Line BP: (86-133)/(43-72) 96/56 (10/14 0630) FiO2 (%):  [40 %-50 %] 40 % (10/13 2118) Last BM Date: 02/25/21  Weight change: Filed Weights   02/24/21 0450 02/25/21 0602 02/26/21 0509  Weight: 92.8 kg 93.5 kg 93.7 kg    Intake/Output:   Intake/Output Summary (Last 24 hours) at 02/27/2021 0813 Last data filed at 02/27/2021 0630 Gross per 24 hour  Intake 5474.61 ml  Output 3447 ml  Net 2027.61 ml      Physical Exam    CVP 8-9  General:  Well appearing. No resp difficulty HEENT: Normal Neck: Supple. JVP 9 cm. + Rt IJ Swan Carotids 2+ bilat; no bruits. No lymphadenopathy or thyromegaly appreciated. Cor: PMI nondisplaced. Regular rate & rhythm. No rubs, gallops or murmurs. + sternal dressing,, + CTs + EPW Lungs: Clear Abdomen: Soft, nontender, nondistended. No hepatosplenomegaly. No bruits or masses. Good bowel sounds. Extremities: No cyanosis, clubbing, rash, trace bilateral LE edema Neuro: Alert & orientedx3, cranial nerves grossly intact. moves all 4 extremities w/o difficulty. Affect pleasant   Telemetry   NSR 90s, 11 beat run of NSVT   EKG    NSr 90 bpm prolonged QT/QTcB 460/562 ms  Labs    CBC Recent Labs    02/26/21 2007 02/27/21 0409  WBC 7.7 9.5  HGB 12.6* 11.4*  HCT 38.7* 35.1*  MCV  93.0 93.4  PLT 147* 254   Basic Metabolic Panel Recent Labs    02/26/21 2007 02/27/21 0409  NA 136 136  K 4.7 4.5  CL 109 107  CO2 20* 19*  GLUCOSE 105* 114*  BUN 11 9  CREATININE 0.76 0.83  CALCIUM 8.2* 8.1*  MG 2.9* 2.4   Liver Function Tests Recent Labs    02/26/21 0444  AST 70*  ALT 47*  ALKPHOS 71  BILITOT 1.6*  PROT 6.5  ALBUMIN 3.2*   No results for input(s): LIPASE, AMYLASE in the last 72 hours. Cardiac Enzymes No results for input(s): CKTOTAL, CKMB, CKMBINDEX, TROPONINI in the last 72 hours.  BNP: BNP (last 3 results) Recent Labs    01/26/21 1443 02/21/21 1347  BNP 542.0* 487.0*    ProBNP (last 3 results) Recent Labs    02/05/21 1441 02/18/21 1534  PROBNP 1,567* 2,355*     D-Dimer No results for input(s): DDIMER in the last 72 hours. Hemoglobin A1C No results for input(s): HGBA1C in the last 72 hours. Fasting Lipid Panel No results for input(s): CHOL, HDL, LDLCALC, TRIG, CHOLHDL, LDLDIRECT in the last 72 hours. Thyroid Function Tests No results for input(s): TSH, T4TOTAL, T3FREE, THYROIDAB in the last 72 hours.  Invalid input(s): FREET3  Other results:   Imaging    DG Chest [facility]  1 View  Result Date: 02/26/2021 CLINICAL DATA:  Post open cardiac surgery EXAM: PORTABLE CHEST 1 VIEW COMPARISON:  02/21/2021 chest radiograph. FINDINGS: Endotracheal tube tip is 6.6 cm above the carina. Enteric tube enters stomach with the tip not seen on this image. Right internal jugular Swan-Ganz catheter terminates in the main pulmonary artery. Intact sternotomy wires. Left-sided mediastinal drain and left basilar chest tube in place. Stable cardiomediastinal silhouette with top-normal heart size. Tiny left apical pneumothorax, less than 5%. No right pneumothorax. No pleural effusion. No pulmonary edema. Mild left basilar atelectasis. IMPRESSION: 1. Tiny left apical pneumothorax, less than 5%. Left chest tube in place. 2. Well-positioned support structures.  3. Mild left basilar atelectasis. Electronically Signed   By: Ilona Sorrel M.D.   On: 02/26/2021 14:47     Medications:     Scheduled Medications:  acetaminophen  1,000 mg Oral Q6H   Or   acetaminophen (TYLENOL) oral liquid 160 mg/5 mL  1,000 mg Per Tube Q6H   aspirin EC  325 mg Oral Daily   Or   aspirin  324 mg Per Tube Daily   bisacodyl  10 mg Oral Daily   Or   bisacodyl  10 mg Rectal Daily   chlorhexidine gluconate (MEDLINE KIT)  15 mL Mouth Rinse BID   Chlorhexidine Gluconate Cloth  6 each Topical Daily   Chlorhexidine Gluconate Cloth  6 each Topical Daily   docusate sodium  200 mg Oral Daily   feeding supplement  237 mL Oral BID BM   influenza vaccine adjuvanted  0.5 mL Intramuscular Tomorrow-1000   insulin aspart  0-24 Units Subcutaneous Q4H   midodrine  10 mg Oral TID WC   mupirocin ointment  1 application Nasal BID   [START ON 02/28/2021] pantoprazole  40 mg Oral Daily   rosuvastatin  5 mg Oral QHS   sodium chloride flush  10-40 mL Intracatheter Q12H   sodium chloride flush  3 mL Intravenous Q12H    Infusions:  sodium chloride 20 mL/hr at 02/27/21 0600   sodium chloride     sodium chloride     albumin human 12.5 g (02/27/21 0110)   And   sodium chloride 250 mL (02/27/21 0109)   amiodarone 30 mg/hr (02/27/21 0600)    ceFAZolin (ANCEF) IV 2 g (02/27/21 0627)   DOBUTamine 2.5 mcg/kg/min (02/27/21 0600)   lactated ringers     lactated ringers     lactated ringers 20 mL/hr at 02/27/21 0627   phenylephrine (NEO-SYNEPHRINE) Adult infusion 60 mcg/min (02/27/21 0600)    PRN Medications: sodium chloride, albumin human **AND** sodium chloride, dextrose, lactated ringers, metoprolol tartrate, midazolam, morphine injection, ondansetron (ZOFRAN) IV, oxyCODONE, sodium chloride flush, sodium chloride flush, traMADol    Assessment/Plan   1. Acute Systolic Heart Failure  - Echo 9/22 EF 25-30%, RV normal  - ICM. 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 CI at 2.5  - S/p CABG 10/13 - Interoperative TEE EF 35-40%, RV normal  - on DBA 2.5 + NEO 60. Hemodynamics good on swan. CI 3.17 - Wean NEO as tolerated. Can add midodrine to facilitate  - CVP 8-9. No diuretics yet. May begin gentle diuresis tomorrow    2. 2V CAD  - HS trop 11>>11 - LHC w/ 80% pLAD, 70-75% pD1, 80% pOM, 60-70% PL branch disease - s/p CABG x 4 (LIMA to LAD, SVG to DIAGONAL, SVG to OM, SVG to PLB) -  ASA + Statin  - no ? blocker yet while on DBA    3. 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 Type 2DM  - Management per primary team      Length of Stay: [address], PA-C  02/27/2021, 8:13 AM  Advanced Heart Failure Team Pager [phone] (M-F; 7a - 5p)  Please contact [facility] for night-coverage after hours (5p -7a ) and weekends on [email or web address]  Patient seen and examined with the above-signed Advanced Practice Provider and/or Housestaff. I personally reviewed laboratory data, imaging studies and relevant notes. I independently examined the patient and formulated the important aspects of the plan. I have edited the note to reflect any of my changes or salient points. I have personally discussed the plan with the patient and/or family.  Doing well POD #1. Luiz Blare numbers reviewed personally. Stable on DBA and Neo. Rhythm stable   Chest sore   General: Sitting up in bed. No respiratory difficulty.  HEENT: normal Neck: supple. RIJ swan  Carotids 2+ bilat; no bruits. No lymphadenopathy or thryomegaly appreciated. Cor: Sternal dressing and CTs Ok  Regular rate & rhythm. No rubs, gallops or murmurs. Lungs: clear Abdomen: soft, nontender, mildly distended. Hypoactive bowel sounds. Extremities: no cyanosis, clubbing, rash, tr edema Neuro: alert & orientedx3, cranial nerves grossly intact. moves all 4  extremities w/o difficulty. Affect pleasant  Hemodynamics look good. Begin to wean pressor/inotropes slowly. Likely start low-dose lasix tomorrow. Continue IV amio. Start warfarin when ok with TCTS.   AHF team will sign off and defer post-op management to TCTS and Dr. Terri Skains. Please call us as needed.   CRITICAL CARE Performed by: Glori Bickers  Total critical care time: 35 minutes  Critical care time was exclusive of separately billable procedures and treating other patients.  Critical care was necessary to treat or prevent imminent or life-threatening deterioration.  Critical care was time spent personally by me (independent of midlevel providers or residents) on the following activities: development of treatment plan with patient and/or surrogate as well as nursing, discussions with consultants, evaluation of patient's response to treatment, examination of patient, obtaining history from patient or surrogate, ordering and performing treatments and interventions, ordering and review of laboratory studies, ordering and review of radiographic studies, pulse oximetry and re-evaluation of patient's condition.  Glori Bickers, MD  5:36 PM

## 2021-02-27 NOTE — Anesthesia Postprocedure Evaluation (Signed)
Anesthesia Post Note  Patient: Derek Bond  Procedure(s) Performed: CORONARY ARTERY BYPASS GRAFTING (CABG) X4, ON PUMP, USING LEFT INTERNAL MAMMARY ARTERY AND RIGHT ENDOSCOPICALLY HARVESTED GREATER SAPHENOUS VEIN (Chest) CLIPPING OF ATRIAL APPENDAGE USING ATRICURE CLIP SIZE 42 MAZE TRANSESOPHAGEAL ECHOCARDIOGRAM (TEE) APPLICATION OF CELL SAVER ENDOVEIN HARVEST OF GREATER SAPHENOUS VEIN (Right)     Patient location during evaluation: SICU Anesthesia Type: General Level of consciousness: awake Pain management: pain level controlled Vital Signs Assessment: post-procedure vital signs reviewed and stable Respiratory status: spontaneous breathing and nonlabored ventilation Cardiovascular status: stable Postop Assessment: no apparent nausea or vomiting Anesthetic complications: no   No notable events documented.  Last Vitals:  Vitals:   02/27/21 0615 02/27/21 0630  BP:    Pulse: 95 91  Resp: (!) 21 17  Temp: 37.1 C 37.1 C  SpO2: 97% 95%    Last Pain:  Vitals:   02/27/21 0800  TempSrc:   PainSc: [place] Bear Osten

## 2021-02-27 NOTE — Progress Notes (Addendum)
TCTS DAILY ICU PROGRESS NOTE                   [address]            [place] [address]          [phone]   1 Day Post-Op Procedure(s) (LRB): CORONARY ARTERY BYPASS GRAFTING (CABG) X4, ON PUMP, USING LEFT INTERNAL MAMMARY ARTERY AND RIGHT ENDOSCOPICALLY HARVESTED GREATER SAPHENOUS VEIN (N/A) CLIPPING OF ATRIAL APPENDAGE USING ATRICURE CLIP SIZE 45 (N/A) MAZE (N/A) TRANSESOPHAGEAL ECHOCARDIOGRAM (TEE) (N/A) APPLICATION OF CELL SAVER (N/A) ENDOVEIN HARVEST OF GREATER SAPHENOUS VEIN (Right)  Total Length of Stay:  LOS: 6 days   Subjective: Feels pretty well  Objective: Vital signs in last 24 hours: Temp:  [97.7 F (36.5 C)-99.1 F (37.3 C)] 98.8 F (37.1 C) (10/14 0630) Pulse Rate:  [68-95] 91 (10/14 0630) Cardiac Rhythm: Normal sinus rhythm (10/14 0400) Resp:  [12-26] 17 (10/14 0630) BP: (100-131)/(54-78) 122/69 (10/14 0600) SpO2:  [95 %-100 %] 95 % (10/14 0630) Arterial Line BP: (86-133)/(43-72) 96/56 (10/14 0630) FiO2 (%):  [40 %-50 %] 40 % (10/13 2118)  Filed Weights   02/24/21 0450 02/25/21 0602 02/26/21 0509  Weight: 92.8 kg 93.5 kg 93.7 kg    Weight change:    Hemodynamic parameters for last 24 hours: PAP: (25-45)/(8-26) 27/14 CVP:  [0 mmHg-19 mmHg] 8 mmHg CO:  [3.2 L/min-4.9 L/min] 4.9 L/min CI:  [1.4 L/min/m2-2.1 L/min/m2] 2.1 L/min/m2  Intake/Output from previous day: 10/13 0701 - 10/14 0700 In: 5474.6 [P.O.:240; I.V.:4694.6; IV Piggyback:540] Out: 9147 [Urine:2380; Blood:667; Chest Tube:400]  Intake/Output this shift: No intake/output data recorded.  Current Meds: Scheduled Meds:  acetaminophen  1,000 mg Oral Q6H   Or   acetaminophen (TYLENOL) oral liquid 160 mg/5 mL  1,000 mg Per Tube Q6H   aspirin EC  325 mg Oral Daily   Or   aspirin  324 mg Per Tube Daily   bisacodyl  10 mg Oral Daily   Or   bisacodyl  10 mg Rectal Daily   chlorhexidine gluconate (MEDLINE KIT)  15 mL Mouth Rinse BID   Chlorhexidine Gluconate Cloth  6  each Topical Daily   Chlorhexidine Gluconate Cloth  6 each Topical Daily   docusate sodium  200 mg Oral Daily   feeding supplement  237 mL Oral BID BM   influenza vaccine adjuvanted  0.5 mL Intramuscular Tomorrow-1000   insulin aspart  0-24 Units Subcutaneous Q4H   midodrine  10 mg Oral TID WC   mupirocin ointment  1 application Nasal BID   [START ON 02/28/2021] pantoprazole  40 mg Oral Daily   rosuvastatin  5 mg Oral QHS   sodium chloride flush  10-40 mL Intracatheter Q12H   sodium chloride flush  3 mL Intravenous Q12H   Continuous Infusions:  sodium chloride 20 mL/hr at 02/27/21 0600   sodium chloride     sodium chloride     albumin human 12.5 g (02/27/21 0110)   And   sodium chloride 250 mL (02/27/21 0109)   amiodarone 30 mg/hr (02/27/21 0600)    ceFAZolin (ANCEF) IV 2 g (02/27/21 0627)   DOBUTamine 2.5 mcg/kg/min (02/27/21 0600)   lactated ringers     lactated ringers     lactated ringers 20 mL/hr at 02/27/21 0627   phenylephrine (NEO-SYNEPHRINE) Adult infusion 60 mcg/min (02/27/21 0600)   PRN Meds:.sodium chloride, albumin human **AND** sodium chloride, dextrose, lactated ringers, metoprolol tartrate, midazolam, morphine injection, ondansetron (ZOFRAN) IV,  oxyCODONE, sodium chloride flush, sodium chloride flush, traMADol  General appearance: alert, cooperative, and no distress Heart: regular rate and rhythm Lungs: clear to auscultation bilaterally Abdomen: benign Extremities: no edema Wound: dressings intact  Lab Results: CBC: Recent Labs    02/26/21 2007 02/27/21 0409  WBC 7.7 9.5  HGB 12.6* 11.4*  HCT 38.7* 35.1*  PLT 147* 155   BMET:  Recent Labs    02/26/21 2007 02/27/21 0409  NA 136 136  K 4.7 4.5  CL 109 107  CO2 20* 19*  GLUCOSE 105* 114*  BUN 11 9  CREATININE 0.76 0.83  CALCIUM 8.2* 8.1*    CMET: Lab Results  Component Value Date   WBC 9.5 02/27/2021   HGB 11.4 (L) 02/27/2021   HCT 35.1 (L) 02/27/2021   PLT 155 02/27/2021   GLUCOSE 114  (H) 02/27/2021   CHOL 99 02/22/2021   TRIG 56 02/22/2021   HDL 44 02/22/2021   LDLCALC 44 02/22/2021   ALT 47 (H) 02/26/2021   AST 70 (H) 02/26/2021   NA 136 02/27/2021   K 4.5 02/27/2021   CL 107 02/27/2021   CREATININE 0.83 02/27/2021   BUN 9 02/27/2021   CO2 19 (L) 02/27/2021   TSH 2.348 02/22/2021   INR 1.5 (H) 02/26/2021   HGBA1C 8.1 (H) 02/22/2021      PT/INR:  Recent Labs    02/26/21 1412  LABPROT 18.2*  INR 1.5*   Radiology: [facility] Chest Port 1 View  Result Date: 02/26/2021 CLINICAL DATA:  Post open cardiac surgery EXAM: PORTABLE CHEST 1 VIEW COMPARISON:  02/21/2021 chest radiograph. FINDINGS: Endotracheal tube tip is 6.6 cm above the carina. Enteric tube enters stomach with the tip not seen on this image. Right internal jugular Swan-Ganz catheter terminates in the main pulmonary artery. Intact sternotomy wires. Left-sided mediastinal drain and left basilar chest tube in place. Stable cardiomediastinal silhouette with top-normal heart size. Tiny left apical pneumothorax, less than 5%. No right pneumothorax. No pleural effusion. No pulmonary edema. Mild left basilar atelectasis. IMPRESSION: 1. Tiny left apical pneumothorax, less than 5%. Left chest tube in place. 2. Well-positioned support structures. 3. Mild left basilar atelectasis. Electronically Signed   By: Ilona Sorrel M.D.   On: 02/26/2021 14:47   ECHO INTRAOPERATIVE TEE  Result Date: 02/26/2021  *INTRAOPERATIVE TRANSESOPHAGEAL REPORT *  Patient Name:   Derek Bond Date of Exam: 02/26/2021 Medical Rec #:  [id number]       Height:       78.0 in Accession #:    [id number]      Weight:       206.5 lb Date of Birth:  12-04-1945        BSA:          2.29 m Patient Age:    75 years        BP:           125/68 mmHg Patient Gender: M               HR:           73 bpm. Exam Location:  Anesthesiology Transesophogeal exam was perform intraoperatively during surgical procedure. Patient was closely monitored under general  anesthesia during the entirety of examination. Indications:     CAD, Atrial Fibrillation Performing Phys: Suella Broad MD Complications: No known complications during this procedure.             POST-OP IMPRESSIONS s/p CABG (x4), MAZE & atrial clip placement  _ Left Ventricle: LVEF improved. Septal, inferoseptal and inferior wall showing improved function. CO > 5L/min. _ Right Ventricle: The right ventricle appears unchanged from pre-bypass. _ Aorta: Aorta unchanged, no dissection noted after cannula removed. _ Left Atrium: Minimal dilation. _ Left Atrial Appendage: No longer patent, 93m Atrial clip in place. _ Aortic Valve: The aortic valve appears unchanged from pre-bypass. _ Mitral Valve: The mitral valve appears unchanged from pre-bypass. _ Tricuspid Valve: The tricuspid valve appears unchanged from pre-bypass. _ Pulmonic Valve: The pulmonic valve appears unchanged from pre-bypass. _ Interatrial Septum: The interatrial septum appears unchanged from pre-bypass. _ Interventricular Septum: The interventricular septum appears unchanged from pre-bypass. _ Pericardium: No pericardial effusion noted. PRE-OP FINDINGS  Left Ventricle: The left ventricle has moderately reduced systolic function, with an ejection fraction of 35-40%. The cavity size was mildly dilated. Left ventricular diffuse hypokinesis. Moderate hypokinesis of the entire septal wall, inferoseptal wall  and inferior wall. Mild hypokinesis of the left ventricular, entire anterolateral wall, lateral wall and anterior wall. There is no left ventricular hypertrophy. Left ventricular diastolic function could not be evaluated. Right Ventricle: The right ventricle has normal systolic function. The cavity was normal. There is no increase in right ventricular wall thickness. There is no aneurysm seen. PA catheter traversing the RV into the main pulmonary artery. Left Atrium: Left atrial size was dilated. No left atrial/left atrial appendage thrombus was detected.  The left atrial appendage is well visualized and there is no evidence of thrombus present. Right Atrium: Right atrial size was dilated. PA catheter traversing the right atrium into the right ventricle. Interatrial Septum: No atrial level shunt detected by color flow Doppler. There is no evidence of a patent foramen ovale. Pericardium: Trivial pericardial effusion is present. The pericardial effusion is circumferential. There is no pleural effusion. Mitral Valve: The mitral valve is normal in structure. Mitral valve regurgitation is mild by color flow Doppler. The MR jet is centrally-directed. There is no evidence of mitral valve vegetation. There is no evidence of mitral stenosis. Tricuspid Valve: The tricuspid valve was normal in structure. Tricuspid valve regurgitation was not visualized by color flow Doppler. No evidence of tricuspid stenosis is present. There is no evidence of tricuspid valve vegetation. Aortic Valve: The aortic valve is tricuspid aortic valve regurgitation was not visualized by color flow Doppler. There is no stenosis of the aortic valve with a mean gradient of 8 mmhg. There is no evidence of aortic valve vegetation. There is moderate thickening and moderate calcification present on the aortic valve right coronary, left coronary and non-coronary cusps with moderately decreased mobility. Pulmonic Valve: The pulmonic valve was normal in structure. Pulmonic valve regurgitation is not visualized by color flow Doppler. Aorta: The ascending aorta and aortic arch are normal in size and structure. There is moderate dilatation of the aortic root, measuring 40 mm. Pulmonary Artery: SGordy Councilmancatheter present on the right. The pulmonary artery is of normal size. Venous: The inferior vena cava was not well visualized. Shunts: There is no evidence of an atrial septal defect. +--------------+-------++ LEFT VENTRICLE        +--------------+-------++ PLAX 2D               +--------------+-------++  LVIDd:        6.20 cm +--------------+-------++ LVIDs:        4.90 cm +--------------+-------++ LV SV:        81 ml   +--------------+-------++ LV SV Index:  35.78   +--------------+-------++                       +--------------+-------++ +------------------+------------++  AORTIC VALVE                   +------------------+------------++ AV Vmax:          187.00 cm/s  +------------------+------------++ AV Vmean:         128.000 cm/s +------------------+------------++ AV VTI:           0.461 m      +------------------+------------++ AV Peak Grad:     14.0 mmHg    +------------------+------------++ AV Mean Grad:     8.0 mmHg     +------------------+------------++ LVOT Vmax:        159.00 cm/s  +------------------+------------++ LVOT Vmean:       107.000 cm/s +------------------+------------++ LVOT VTI:         0.321 m      +------------------+------------++ LVOT/AV VTI ratio:0.70         +------------------+------------++ +--------------+-----------++ MITRAL VALVE               +-------------+------+ +--------------+-----------++  SHUNTS              MV Area (PHT):1.32 cm     +-------------+------+ +--------------+-----------++  Systemic VTI:0.32 m MV PHT:       167.00 msec  +-------------+------+ +--------------+-----------++  Suella Broad MD Electronically signed by Suella Broad MD Signature Date/Time: 02/26/2021/3:56:40 PM    Final      Assessment/Plan: S/P Procedure(s) (LRB): CORONARY ARTERY BYPASS GRAFTING (CABG) X4, ON PUMP, USING LEFT INTERNAL MAMMARY ARTERY AND RIGHT ENDOSCOPICALLY HARVESTED GREATER SAPHENOUS VEIN (N/A) CLIPPING OF ATRIAL APPENDAGE USING ATRICURE CLIP SIZE 45 (N/A) MAZE (N/A) TRANSESOPHAGEAL ECHOCARDIOGRAM (TEE) (N/A) APPLICATION OF CELL SAVER (N/A) ENDOVEIN HARVEST OF GREATER SAPHENOUS VEIN (Right)  1 afebrile, hemodyn stable on low dose dobut.  Also neo- wean as able- also start midodrine. Keep SG for  now. Maintaining sinus, cont amio gtt today with plans to transition to po tomorrow, resume eliquis tomorrow 2 sats ok on 2 liters [place] 3 good UOP, normal renal fxn- not weighed yet 4 CT 400 cc postop- keeping for now 5 minor expected ABLA, stable, monitor 6 transition to levimir/SSI- transition to home meds over time, HGb A1C 8.1 7 CXR no significant effus/infiltrates 8 routine pulm toilet/cardiac rehab   John Giovanni PA-C Pager [phone] 02/27/2021 7:43 AM   Agree with above doing well Will keep lines Up to chair today  Lajuana Matte

## 2021-02-28 ENCOUNTER — Inpatient Hospital Stay (HOSPITAL_COMMUNITY): Payer: Medicare PPO

## 2021-02-28 LAB — GLUCOSE, CAPILLARY
Glucose-Capillary: 141 mg/dL — ABNORMAL HIGH (ref 70–99)
Glucose-Capillary: 142 mg/dL — ABNORMAL HIGH (ref 70–99)
Glucose-Capillary: 171 mg/dL — ABNORMAL HIGH (ref 70–99)
Glucose-Capillary: 183 mg/dL — ABNORMAL HIGH (ref 70–99)
Glucose-Capillary: 186 mg/dL — ABNORMAL HIGH (ref 70–99)
Glucose-Capillary: 186 mg/dL — ABNORMAL HIGH (ref 70–99)
Glucose-Capillary: 204 mg/dL — ABNORMAL HIGH (ref 70–99)

## 2021-02-28 LAB — CBC
HCT: 33.6 % — ABNORMAL LOW (ref 39.0–52.0)
Hemoglobin: 11.2 g/dL — ABNORMAL LOW (ref 13.0–17.0)
MCH: 30.9 pg (ref 26.0–34.0)
MCHC: 33.3 g/dL (ref 30.0–36.0)
MCV: 92.8 fL (ref 80.0–100.0)
Platelets: 149 10*3/uL — ABNORMAL LOW (ref 150–400)
RBC: 3.62 MIL/uL — ABNORMAL LOW (ref 4.22–5.81)
RDW: 15.9 % — ABNORMAL HIGH (ref 11.5–15.5)
WBC: 8.8 10*3/uL (ref 4.0–10.5)
nRBC: 0 % (ref 0.0–0.2)

## 2021-02-28 LAB — BASIC METABOLIC PANEL
Anion gap: 4 — ABNORMAL LOW (ref 5–15)
BUN: 12 mg/dL (ref 8–23)
CO2: 23 mmol/L (ref 22–32)
Calcium: 8.2 mg/dL — ABNORMAL LOW (ref 8.9–10.3)
Chloride: 106 mmol/L (ref 98–111)
Creatinine, Ser: 0.68 mg/dL (ref 0.61–1.24)
GFR, Estimated: >60 mL/min (ref >60)
Glucose, Bld: 160 mg/dL — ABNORMAL HIGH (ref 70–99)
Potassium: 4.1 mmol/L (ref 3.5–5.1)
Sodium: 133 mmol/L — ABNORMAL LOW (ref 135–145)

## 2021-02-28 MED ORDER — INSULIN ASPART 100 UNIT/ML IJ SOLN: 0.0000 [IU] | INTRAMUSCULAR | Status: DC

## 2021-02-28 MED ORDER — METOPROLOL TARTRATE 12.5 MG HALF TABLET: 12.5000 mg | ORAL_TABLET | Freq: Two times a day (BID) | ORAL | Status: DC

## 2021-02-28 MED ORDER — CARVEDILOL 3.125 MG PO TABS
3.1250 mg | ORAL_TABLET | Freq: Two times a day (BID) | ORAL | Status: DC
Start: 2021-02-28 — End: 2021-03-04
  Administered 2021-02-28 – 2021-03-04 (×8): 3.125 mg via ORAL
  Filled 2021-02-28 (×8): qty 1

## 2021-02-28 MED ORDER — AMIODARONE HCL 200 MG PO TABS
400.0000 mg | ORAL_TABLET | Freq: Two times a day (BID) | ORAL | Status: DC
  Administered 2021-02-28 – 2021-03-04 (×9): 400 mg via ORAL
  Filled 2021-02-28 (×9): qty 2

## 2021-02-28 NOTE — Progress Notes (Signed)
[address]       [place] [address]             [phone]                 2 Days Post-Op Procedure(s) (LRB): CORONARY ARTERY BYPASS GRAFTING (CABG) X4, ON PUMP, USING LEFT INTERNAL MAMMARY ARTERY AND RIGHT ENDOSCOPICALLY HARVESTED GREATER SAPHENOUS VEIN (N/A) CLIPPING OF ATRIAL APPENDAGE USING ATRICURE CLIP SIZE 45 (N/A) MAZE (N/A) TRANSESOPHAGEAL ECHOCARDIOGRAM (TEE) (N/A) APPLICATION OF CELL SAVER (N/A) ENDOVEIN HARVEST OF GREATER SAPHENOUS VEIN (Right)   Events: No events _______________________________________________________________ Vitals: BP 135/83   Pulse (!) 44   Temp 98.4 F (36.9 C)   Resp 19   Ht 6\' 6"  (1.981 m)   Wt 105.1 kg   SpO2 92%   BMI 26.78 kg/m  Filed Weights   02/25/21 0602 02/26/21 0509 02/28/21 0500  Weight: 93.5 kg 93.7 kg 105.1 kg     - Neuro: alert NAD  - Cardiovascular: sinus  Drips: amio 30, dob 5.   PAP: (26-63)/(8-39) 28/12 CVP:  [3 mmHg-47 mmHg] 7 mmHg CO:  [7.7 L/min] 7.7 L/min CI:  [3.3 L/min/m2] 3.3 L/min/m2  - Pulm: EWOB    ABG    Component Value Date/Time   PHART 7.339 (L) 02/26/2021 2144   PCO2ART 33.9 02/26/2021 2144   PO2ART 132 (H) 02/26/2021 2144   HCO3 18.2 (L) 02/26/2021 2144   TCO2 19 (L) 02/26/2021 2144   ACIDBASEDEF 7.0 (H) 02/26/2021 2144   O2SAT 99.0 02/26/2021 2144    - Abd: ND - Extremity: trace edema  .Intake/Output      10/14 0701 10/15 0700 10/15 0701 10/16 0700   P.Bond. 240    I.V. (mL/kg) 1913.5 (18.2)    IV Piggyback 399    Total Intake(mL/kg) 2552.5 (24.3)    Urine (mL/kg/hr) 1770 (0.7) 155 (0.7)   Blood     Chest Tube 200 20   Total Output 1970 175   Net +582.5 -175           _______________________________________________________________ Labs: CBC Latest Ref Rng & Units 02/28/2021 02/27/2021 02/27/2021  WBC 4.0 - 10.5 K/uL 8.8 10.1 9.5  Hemoglobin 13.0 - 17.0 g/dL 11.2(L) 11.2(L) 11.4(L)  Hematocrit 39.0 - 52.0 % 33.6(L) 34.3(L) 35.1(L)  Platelets  150 - 400 K/uL 149(L) 126(L) 155   CMP Latest Ref Rng & Units 02/28/2021 02/27/2021 02/27/2021  Glucose 70 - 99 mg/dL 160(H) 177(H) 114(H)  BUN 8 - 23 mg/dL 12 12 9   Creatinine 0.61 - 1.24 mg/dL 0.68 0.88 0.83  Sodium 135 - 145 mmol/L 133(L) 133(L) 136  Potassium 3.5 - 5.1 mmol/L 4.1 4.8 4.5  Chloride 98 - 111 mmol/L 106 104 107  CO2 22 - 32 mmol/L 23 21(L) 19(L)  Calcium 8.9 - 10.3 mg/dL 8.2(L) 8.2(L) 8.1(L)  Total Protein 6.5 - 8.1 g/dL - - -  Total Bilirubin 0.3 - 1.2 mg/dL - - -  Alkaline Phos 38 - 126 U/L - - -  AST 15 - 41 U/L - - -  ALT 0 - 44 U/L - - -    CXR: Small L effusion  _______________________________________________________________  Assessment and Plan: POD 2 s/p CABG, MAZE  Neuro: pain controlled. CV: will d/c dob.  Convert amio to PO.  Will remove swan and a line.  Will start low dose coreg.  Will remove wires.  Will start anticoagulation tomorrow Pulm: continue pulm hygiene Renal: creat stable.  Possible diuresis  today GI: advancing diet Heme: stable ID: afebrile Endo: SSI Dispo: continue ICU care   Derek Bond Derek Bond 02/28/2021 9:12 AM

## 2021-03-01 ENCOUNTER — Inpatient Hospital Stay (HOSPITAL_COMMUNITY): Payer: Medicare PPO

## 2021-03-01 LAB — BASIC METABOLIC PANEL
Anion gap: 5 (ref 5–15)
BUN: 22 mg/dL (ref 8–23)
CO2: 26 mmol/L (ref 22–32)
Calcium: 8.9 mg/dL (ref 8.9–10.3)
Chloride: 102 mmol/L (ref 98–111)
Creatinine, Ser: 0.75 mg/dL (ref 0.61–1.24)
GFR, Estimated: >60 mL/min (ref >60)
Glucose, Bld: 148 mg/dL — ABNORMAL HIGH (ref 70–99)
Potassium: 4.6 mmol/L (ref 3.5–5.1)
Sodium: 133 mmol/L — ABNORMAL LOW (ref 135–145)

## 2021-03-01 LAB — CBC
HCT: 34.7 % — ABNORMAL LOW (ref 39.0–52.0)
Hemoglobin: 11.5 g/dL — ABNORMAL LOW (ref 13.0–17.0)
MCH: 30.8 pg (ref 26.0–34.0)
MCHC: 33.1 g/dL (ref 30.0–36.0)
MCV: 93 fL (ref 80.0–100.0)
Platelets: 166 10*3/uL (ref 150–400)
RBC: 3.73 MIL/uL — ABNORMAL LOW (ref 4.22–5.81)
RDW: 15.8 % — ABNORMAL HIGH (ref 11.5–15.5)
WBC: 7.1 10*3/uL (ref 4.0–10.5)
nRBC: 0.3 % — ABNORMAL HIGH (ref 0.0–0.2)

## 2021-03-01 LAB — TYPE AND SCREEN
ABO/RH(D): O POS
Antibody Screen: NEGATIVE
Unit division: 0
Unit division: 0

## 2021-03-01 LAB — GLUCOSE, CAPILLARY
Glucose-Capillary: 143 mg/dL — ABNORMAL HIGH (ref 70–99)
Glucose-Capillary: 192 mg/dL — ABNORMAL HIGH (ref 70–99)
Glucose-Capillary: 162 mg/dL — ABNORMAL HIGH (ref 70–99)
Glucose-Capillary: 223 mg/dL — ABNORMAL HIGH (ref 70–99)
Glucose-Capillary: 213 mg/dL — ABNORMAL HIGH (ref 70–99)

## 2021-03-01 LAB — BPAM RBC
Blood Product Expiration Date: 202211042359
Blood Product Expiration Date: 202211042359
Unit Type and Rh: 5100
Unit Type and Rh: 5100

## 2021-03-01 MED ORDER — ~~LOC~~ CARDIAC SURGERY, PATIENT & FAMILY EDUCATION: Freq: Once | Status: AC

## 2021-03-01 MED ORDER — INSULIN ASPART 100 UNIT/ML IJ SOLN
0.0000 [IU] | Freq: Three times a day (TID) | INTRAMUSCULAR | Status: DC
  Administered 2021-03-01: 5 [IU] via SUBCUTANEOUS
  Administered 2021-03-01: 3 [IU] via SUBCUTANEOUS
  Administered 2021-03-02: 8 [IU] via SUBCUTANEOUS
  Administered 2021-03-02: 3 [IU] via SUBCUTANEOUS
  Administered 2021-03-02 – 2021-03-03 (×2): 5 [IU] via SUBCUTANEOUS
  Administered 2021-03-03: 3 [IU] via SUBCUTANEOUS
  Administered 2021-03-03: 8 [IU] via SUBCUTANEOUS
  Administered 2021-03-04: 5 [IU] via SUBCUTANEOUS
  Administered 2021-03-04: 8 [IU] via SUBCUTANEOUS

## 2021-03-01 MED ORDER — SODIUM CHLORIDE 0.9 % IV SOLN: 250.0000 mL | INTRAVENOUS | Status: DC | PRN

## 2021-03-01 MED ORDER — INSULIN GLARGINE-YFGN 100 UNIT/ML ~~LOC~~ SOLN
10.0000 [IU] | Freq: Every day | SUBCUTANEOUS | Status: DC
  Administered 2021-03-01 – 2021-03-02 (×2): 10 [IU] via SUBCUTANEOUS
  Filled 2021-03-01 (×3): qty 0.1

## 2021-03-01 MED ORDER — POTASSIUM CHLORIDE CRYS ER 20 MEQ PO TBCR
40.0000 meq | EXTENDED_RELEASE_TABLET | Freq: Every day | ORAL | Status: DC
  Administered 2021-03-01 – 2021-03-04 (×4): 40 meq via ORAL
  Filled 2021-03-01 (×4): qty 2

## 2021-03-01 MED ORDER — SODIUM CHLORIDE 0.9% FLUSH
3.0000 mL | INTRAVENOUS | Status: DC | PRN
  Administered 2021-03-01 (×2): 3 mL via INTRAVENOUS

## 2021-03-01 MED ORDER — FUROSEMIDE 40 MG PO TABS
40.0000 mg | ORAL_TABLET | Freq: Every day | ORAL | Status: DC
  Administered 2021-03-02 – 2021-03-04 (×3): 40 mg via ORAL
  Filled 2021-03-01 (×3): qty 1

## 2021-03-01 MED ORDER — SODIUM CHLORIDE 0.9% FLUSH
3.0000 mL | Freq: Two times a day (BID) | INTRAVENOUS | Status: DC
  Administered 2021-03-01 – 2021-03-04 (×7): 3 mL via INTRAVENOUS

## 2021-03-01 MED ORDER — MIDODRINE HCL 5 MG PO TABS
5.0000 mg | ORAL_TABLET | Freq: Three times a day (TID) | ORAL | Status: DC
  Administered 2021-03-01 – 2021-03-02 (×4): 5 mg via ORAL
  Filled 2021-03-01 (×4): qty 1

## 2021-03-01 MED ORDER — FUROSEMIDE 10 MG/ML IJ SOLN
40.0000 mg | Freq: Once | INTRAMUSCULAR | Status: AC
  Administered 2021-03-01: 40 mg via INTRAVENOUS
  Filled 2021-03-01: qty 4

## 2021-03-01 NOTE — Progress Notes (Signed)
Patient arrived from [facility] to [id number]. Patient placed on monitor, vital signs obtained and CHG bath completed. CCMD made aware patient on monitor and on tele box [id number]. Call bell with in reach. Billye Nydam, Bettina Gavia RN

## 2021-03-01 NOTE — Progress Notes (Signed)
Mobility Specialist Progress Note:   03/01/21 1500  Mobility  Activity Ambulated in hall  Level of Assistance Standby assist, set-up cues, supervision of patient - no hands on  Assistive Device Other (Comment) (EVA)  Distance Ambulated (ft) 520 ft  Mobility Ambulated with assistance in hallway  Mobility Response Tolerated well  Mobility performed by Mobility specialist  Bed Position Chair  $Mobility charge 1 Mobility   Pre- Mobility:  69 HR; 139/89 BP;  99% SpO2 Post Mobility:   70 HR; 141/87 BP;  98% SpO2  Pt received in chair and willing to participate in mobility. No complaints of pain. Required 1 short standing rest break but otherwise asymptomatic. Pt requesting to use restroom before returning to bed. Returned to bed with call bell in reach, all needs met and daughter in room.   [facility] Health and safety inspector Phone [phone]

## 2021-03-02 LAB — CBC
HCT: 39 % (ref 39.0–52.0)
Hemoglobin: 12.9 g/dL — ABNORMAL LOW (ref 13.0–17.0)
MCH: 30.9 pg (ref 26.0–34.0)
MCHC: 33.1 g/dL (ref 30.0–36.0)
MCV: 93.3 fL (ref 80.0–100.0)
Platelets: 171 10*3/uL (ref 150–400)
RBC: 4.18 MIL/uL — ABNORMAL LOW (ref 4.22–5.81)
RDW: 15.6 % — ABNORMAL HIGH (ref 11.5–15.5)
WBC: 6.7 10*3/uL (ref 4.0–10.5)
nRBC: 0.3 % — ABNORMAL HIGH (ref 0.0–0.2)

## 2021-03-02 LAB — GLUCOSE, CAPILLARY
Glucose-Capillary: 171 mg/dL — ABNORMAL HIGH (ref 70–99)
Glucose-Capillary: 227 mg/dL — ABNORMAL HIGH (ref 70–99)
Glucose-Capillary: 278 mg/dL — ABNORMAL HIGH (ref 70–99)
Glucose-Capillary: 232 mg/dL — ABNORMAL HIGH (ref 70–99)

## 2021-03-02 LAB — BASIC METABOLIC PANEL
Anion gap: 6 (ref 5–15)
BUN: 22 mg/dL (ref 8–23)
CO2: 23 mmol/L (ref 22–32)
Calcium: 8.8 mg/dL — ABNORMAL LOW (ref 8.9–10.3)
Chloride: 102 mmol/L (ref 98–111)
Creatinine, Ser: 0.76 mg/dL (ref 0.61–1.24)
GFR, Estimated: >60 mL/min (ref >60)
Glucose, Bld: 174 mg/dL — ABNORMAL HIGH (ref 70–99)
Potassium: 4.4 mmol/L (ref 3.5–5.1)
Sodium: 131 mmol/L — ABNORMAL LOW (ref 135–145)

## 2021-03-02 NOTE — Progress Notes (Signed)
CARDIAC REHAB PHASE I   PRE:  Rate/Rhythm: 71 SR with PACs    BP: sitting 145/82    SaO2: 95 RA  MODE:  Ambulation: 420 ft   POST:  Rate/Rhythm: 78 SR    BP: sitting 148/90     SaO2: 98 RA  Pt with min assist with gait belt to stand. Ambulated with RW to BR then in hall. Slow and steady, contact guard with gait belt. Rest x1, tired after walk but did well overall. Return to recliner, able to ease down with control. Pt practiced IS, 1000 ml. Doing well. Will educate with daughter later, she is staying 1 month with him. She might need to help him stand at home (pt 6'6) [place], ACSM 03/02/2021 9:07 AM

## 2021-03-02 NOTE — Progress Notes (Addendum)
      [name]       [place] [address]             [phone]      4 Days Post-Op Procedure(s) (LRB): CORONARY ARTERY BYPASS GRAFTING (CABG) X4, ON PUMP, USING LEFT INTERNAL MAMMARY ARTERY AND RIGHT ENDOSCOPICALLY HARVESTED GREATER SAPHENOUS VEIN (N/A) CLIPPING OF ATRIAL APPENDAGE USING ATRICURE CLIP SIZE 45 (N/A) MAZE (N/A) TRANSESOPHAGEAL ECHOCARDIOGRAM (TEE) (N/A) APPLICATION OF CELL SAVER (N/A) ENDOVEIN HARVEST OF GREATER SAPHENOUS VEIN (Right) Subjective: Feels okay this morning, no complaints. Pain has been well controlled.   Objective: Vital signs in last 24 hours: Temp:  [97.6 F (36.4 C)-98.5 F (36.9 C)] 97.8 F (36.6 C) (10/17 0400) Pulse Rate:  [56-70] 66 (10/17 0400) Cardiac Rhythm: Normal sinus rhythm (10/16 1928) Resp:  [10-18] 17 (10/17 0400) BP: (122-154)/(76-89) 138/83 (10/17 0400) SpO2:  [95 %-100 %] 98 % (10/17 0400) Weight:  [104.2 kg] 104.2 kg (10/17 0600)     Intake/Output from previous day: 10/16 0701 - 10/17 0700 In: 240 [P.O.:240] Out: 1100 [Urine:1100] Intake/Output this shift: No intake/output data recorded.  General appearance: alert, cooperative, and no distress Heart: regular rate and rhythm, S1, S2 normal, no murmur, click, rub or gallop Lungs: clear to auscultation bilaterally Abdomen: soft, non-tender; bowel sounds normal; no masses,  no organomegaly Extremities: extremities normal, atraumatic, no cyanosis or edema Wound: clean and dry  Lab Results: Recent Labs    03/01/21 0425 03/02/21 0126  WBC 7.1 6.7  HGB 11.5* 12.9*  HCT 34.7* 39.0  PLT 166 171   BMET:  Recent Labs    03/01/21 0425 03/02/21 0126  NA 133* 131*  K 4.6 4.4  CL 102 102  CO2 26 23  GLUCOSE 148* 174*  BUN 22 22  CREATININE 0.75 0.76  CALCIUM 8.9 8.8*    PT/INR: No results for input(s): LABPROT, INR in the last 72 hours. ABG    Component Value Date/Time   PHART 7.339 (L) 02/26/2021 2144   HCO3 18.2 (L) 02/26/2021 2144    TCO2 19 (L) 02/26/2021 2144   ACIDBASEDEF 7.0 (H) 02/26/2021 2144   O2SAT 99.0 02/26/2021 2144   CBG (last 3)  Recent Labs    03/01/21 1712 03/01/21 2138 03/02/21 0609  GLUCAP 223* 213* 171*    Assessment/Plan: S/P Procedure(s) (LRB): CORONARY ARTERY BYPASS GRAFTING (CABG) X4, ON PUMP, USING LEFT INTERNAL MAMMARY ARTERY AND RIGHT ENDOSCOPICALLY HARVESTED GREATER SAPHENOUS VEIN (N/A) CLIPPING OF ATRIAL APPENDAGE USING ATRICURE CLIP SIZE 45 (N/A) MAZE (N/A) TRANSESOPHAGEAL ECHOCARDIOGRAM (TEE) (N/A) APPLICATION OF CELL SAVER (N/A) ENDOVEIN HARVEST OF GREATER SAPHENOUS VEIN (Right)   CV: post-op Afib. Converted amio to PO.  Will remove swan and a line.  Will start low dose coreg.  EPW removed.   Pulm: continue pulm hygiene. CXR yesterday stable with atelectasis and small left pleural effusion.  Renal: creat stable.  Continue lasix 40mg  daily for fluid overload.  GI: advancing diet Heme: stable 12.9/39.0 ID: afebrile Endo: SSI  Plan: EKG to further evaluate rhythm. PT consult for possible need for CIR. Continue to ambulate as tolerated. He does have help at home if he ends up being discharged home.    LOS: 9 days    Derek Bond 03/02/2021  [place] Continue PT Dispo planning  [place]

## 2021-03-02 NOTE — Care Management Important Message (Signed)
Important Message  Patient Details  Name: Derek Bond MRN: [id number] Date of Birth: May 07, 1946   Medicare Important Message Given:  Yes     [name] 03/02/2021, 10:48 AM

## 2021-03-02 NOTE — Evaluation (Signed)
Physical Therapy Evaluation Patient Details Name: Derek Bond MRN: [id number] DOB: Jun 30, 1945 Today's Date: 03/02/2021  History of Present Illness  Pt is 75 yo male s/p CABG x 4, MAZE, LAA clip, intraoperative TEE with EF 35-40% on 02/26/21. Pt with hx of cardiomyopathy, chronic HFrEF, hypertension, diabetes mellitus, hyperlipidemia, former smoker, sleep apnea (not on CPAP), persistent atrial fibrillation.  Clinical Impression  Pt admitted with above diagnosis. At baseline pt lives alone and independent.  At d/c, his daughter plans to stay with him initially.  Today, pt ambulated >400' with cardiac rehab earlier.  However, during therapy he fatigued easily and with shortness of breath requiring to be rolled back to room after 40'.  Pt had minor drop in BP with standing when checked with return to bed. He required min A for transfers.  Pt would like to return home at d/c and expected to progress well - recommend HHPT and supervision.  Pt currently with functional limitations due to the deficits listed below (see PT Problem List). Pt will benefit from skilled PT to increase their independence and safety with mobility to allow discharge to the venue listed below.       BP when sitting after walk 130/68 BP standing return to bed 120/66  Sats 95-100% on RA HR 60's during treatment     Recommendations for follow up therapy are one component of a multi-disciplinary discharge planning process, led by the attending physician.  Recommendations may be updated based on patient status, additional functional criteria and insurance authorization.  Follow Up Recommendations Supervision/Assistance - 24 hour;Home health PT    Equipment Recommendations  Rolling walker with 5" wheels    Recommendations for Other Services       Precautions / Restrictions Precautions Precautions: Sternal Precaution Booklet Issued: Yes (comment) Precaution Comments: Pt educated on sternal precauitons, has handout and  booklet      Mobility  Bed Mobility Overal bed mobility: Needs Assistance Bed Mobility: Sidelying to Sit;Sit to Sidelying   Sidelying to sit: Min assist     Sit to sidelying: Min assist General bed mobility comments: Min A for trunk to sit and cues for weight shifting and not pushing to scoot forward.  Min A for legs back to bed    Transfers Overall transfer level: Needs assistance Equipment used: Rolling walker (2 wheeled) Transfers: Sit to/from Stand Sit to Stand: Min guard;Min assist         General transfer comment: Min guard from elevated surface, min A and repeated attempts from bed height (pt is 6'6"); good maintenance of sternal prec  Ambulation/Gait Ambulation/Gait assistance: Min guard Gait Distance (Feet): 40 Feet Assistive device: Rolling walker (2 wheeled) Gait Pattern/deviations: Step-through pattern;Decreased stride length Gait velocity: decreased   General Gait Details: Slow gait and with 2 standing rest breaks and reports of shortness of breath and fatigue.  RN present and noted pt did not appear at his normal or like earlier today.  RN assisted PT in getting pt in chair.  All VSS (HR 60's, sats 95%, bp 130/68).  Pt rolled back to room and transitioned to bed.  Did get standing BP and was 120/66.  Stairs            Wheelchair Mobility    Modified Rankin (Stroke Patients Only)       Balance Overall balance assessment: Needs assistance Sitting-balance support: No upper extremity supported Sitting balance-Leahy Scale: Good     Standing balance support: No upper extremity supported Standing balance-Leahy Scale:  Fair Standing balance comment: RW to ambulate , did stand without AD for BP but mild unsteadiness                             Pertinent Vitals/Pain Pain Assessment: Faces Faces Pain Scale: Hurts a little bit Pain Location: sternum Pain Descriptors / Indicators: Discomfort Pain Intervention(s): Limited activity within  patient's tolerance;Monitored during session    Home Living Family/patient expects to be discharged to:: Private residence Living Arrangements: Alone (Pt's daughter to stay with him initially) Available Help at Discharge: Family;Available 24 hours/day Type of Home: House Home Access: Level entry     Home Layout: Other (Comment) (overall 1 level but does have 1-2 steps in places to get in certain rooms) Home Equipment: Cane - single point      Prior Function Level of Independence: Independent               Hand Dominance        Extremity/Trunk Assessment   Upper Extremity Assessment Upper Extremity Assessment: Overall WFL for tasks assessed (within sternal precaution range)    Lower Extremity Assessment Lower Extremity Assessment: LLE deficits/detail;RLE deficits/detail RLE Deficits / Details: ROM WFL and MMT at least 3/5 not further tested LLE Deficits / Details: ROM WFL and MMT at least 3/5 not further tested    Cervical / Trunk Assessment Cervical / Trunk Assessment: Normal  Communication   Communication: No difficulties  Cognition Arousal/Alertness: Awake/alert Behavior During Therapy: WFL for tasks assessed/performed Overall Cognitive Status: Within Functional Limits for tasks assessed                                        General Comments      Exercises     Assessment/Plan    PT Assessment Patient needs continued PT services  PT Problem List Decreased strength;Decreased mobility;Decreased knowledge of precautions;Decreased activity tolerance;Cardiopulmonary status limiting activity;Decreased balance;Decreased knowledge of use of DME       PT Treatment Interventions DME instruction;Therapeutic activities;Gait training;Therapeutic exercise;Patient/family education;Stair training;Balance training;Functional mobility training    PT Goals (Current goals can be found in the Care Plan section)  Acute Rehab PT Goals Patient Stated Goal:  return home; dtr staying with him initially PT Goal Formulation: With patient/family Time For Goal Achievement: 03/16/21 Potential to Achieve Goals: Good    Frequency Min 3X/week   Barriers to discharge        Co-evaluation               AM-PAC PT "6 Clicks" Mobility  Outcome Measure Help needed turning from your back to your side while in a flat bed without using bedrails?: A Little Help needed moving from lying on your back to sitting on the side of a flat bed without using bedrails?: A Little Help needed moving to and from a bed to a chair (including a wheelchair)?: A Little Help needed standing up from a chair using your arms (e.g., wheelchair or bedside chair)?: A Little Help needed to walk in hospital room?: A Little Help needed climbing 3-5 steps with a railing? : A Little 6 Click Score: 18    End of Session Equipment Utilized During Treatment: Gait belt Activity Tolerance: Patient limited by fatigue Patient left: in bed;with call bell/phone within reach;with family/visitor present;with nursing/sitter in room Nurse Communication: Mobility status;Other (comment) (RN present) PT  Visit Diagnosis: Other abnormalities of gait and mobility (R26.89);Unsteadiness on feet (R26.81)    Time: 7998-7215 PT Time Calculation (min) (ACUTE ONLY): 28 min   Charges:   PT Evaluation $PT Eval Moderate Complexity: 1 Mod PT Treatments $Gait Training: 8-22 mins        Abran Richard, PT Acute Rehab Services Pager [phone] [facility] 03/02/2021, 1:03 PM

## 2021-03-02 NOTE — Progress Notes (Signed)
Came to do education with pt and daughter. Assisted pt to [facility] with RN. Took his time but after standing stated he did see "stars". BP back on EOB 118/66 (had been 127/65 in bed before BR trip). Encouraged pt to finish eating and take a nap. MT to check on him in pm.  1400-1440 Yves Dill CES, ACSM 2:44 PM 03/02/2021

## 2021-03-02 NOTE — Progress Notes (Signed)
Inpatient Rehabilitation Admissions Coordinator   Patient not in need of CIR level rehab at this time per PT eval. We will sign off.   Danne Baxter, RN, MSN Rehab Admissions Coordinator [phone] 03/02/2021 2:54 PM

## 2021-03-02 NOTE — Progress Notes (Signed)
Noticed patient working with physical therapy in hallway and patient requiring standing breaks shortly out of patient room. When checking on patient, patient stated he "just didn't feel right" "maybe a little woozy". Patient alert and oriented, able to answer questions appropriately at this time. Assisted patient with PT to chair and assisted back to room and to bed. Vital signs were obtained. See flow sheet and PT note. Patient stated he was tired and wanted to rest. Call bell within reach. Daughter present at bedside. Deanette Tullius, Bettina Gavia RN

## 2021-03-02 NOTE — Progress Notes (Signed)
Inpatient Rehabilitation Admissions Coordinator   Inpatient rehab consult received. I await PT eval to assist with planning rehab venue options. He is doing well with cardiac rehab today.  Danne Baxter, RN, MSN Rehab Admissions Coordinator [phone] 03/02/2021 10:34 AM

## 2021-03-02 NOTE — Plan of Care (Signed)
  Problem: Activity: Goal: Ability to tolerate increased activity will improve Outcome: Progressing   Problem: Clinical Measurements: Goal: Will remain free from infection Outcome: Progressing Goal: Diagnostic test results will improve Outcome: Progressing Goal: Respiratory complications will improve Outcome: Progressing

## 2021-03-03 LAB — GLUCOSE, CAPILLARY
Glucose-Capillary: 185 mg/dL — ABNORMAL HIGH (ref 70–99)
Glucose-Capillary: 253 mg/dL — ABNORMAL HIGH (ref 70–99)
Glucose-Capillary: 214 mg/dL — ABNORMAL HIGH (ref 70–99)
Glucose-Capillary: 261 mg/dL — ABNORMAL HIGH (ref 70–99)

## 2021-03-03 MED ORDER — ASPIRIN EC 81 MG PO TBEC
81.0000 mg | DELAYED_RELEASE_TABLET | Freq: Every day | ORAL | Status: DC
  Administered 2021-03-04: 81 mg via ORAL
  Filled 2021-03-03: qty 1

## 2021-03-03 MED ORDER — INSULIN GLARGINE-YFGN 100 UNIT/ML ~~LOC~~ SOLN
15.0000 [IU] | Freq: Every day | SUBCUTANEOUS | Status: DC
  Administered 2021-03-03 – 2021-03-04 (×2): 15 [IU] via SUBCUTANEOUS
  Filled 2021-03-03 (×2): qty 0.15

## 2021-03-03 MED ORDER — APIXABAN 5 MG PO TABS
5.0000 mg | ORAL_TABLET | Freq: Two times a day (BID) | ORAL | Status: DC
  Administered 2021-03-03 – 2021-03-04 (×3): 5 mg via ORAL
  Filled 2021-03-03 (×3): qty 1

## 2021-03-03 NOTE — TOC Initial Note (Signed)
Transition of Care [facility]) - Initial/Assessment Note    Patient Details  Name: Derek Bond MRN: [id number] Date of Birth: [birth year]  Transition of Care [facility]) CM/SW Contact:    Cyndi Bender, RN Phone Number: 03/03/2021, 1:38 PM  Clinical Narrative:  Orders for [place] and DME. Spoke to patient regarding transition needs.Choice offered with list provided per CMS guidelines from medicare.gov website with star ratings (copy placed in shadow chart).   Patient agreeable to use in house provider for DME needs. Called [place] with [employer] and ordered walker to be delivered to the room prior to discharge.   Patient's 1st choice [facility] unable to accept due to staffing.  Patient deferred to CM to find agency  [place] with [facility] who accepted referral for PT.   Address, phone number and PCP confirmed. Daughter will pick up patient once discharged.    Expected Discharge Plan: [place] Barriers to Discharge: Continued Medical Work up   Patient Goals and CMS Choice Patient states their goals for this hospitalization and ongoing recovery are:: return home CMS Medicare.gov Compare Post Acute Care list provided to:: Patient Choice offered to / list presented to : Patient  Expected Discharge Plan and Services Expected Discharge Plan: [place]   Discharge Planning Services: CM Consult Post Acute Care Choice: Durable Medical Equipment, Home Health Living arrangements for the past 2 months: Single Family Home                 DME Arranged: Walker rolling DME Agency: [facility] Date DME Agency Contacted: 03/03/21 Time DME Agency Contacted: 1300 Representative spoke with at DME Agency: Chrys Racer HH Arranged: PT [place]: [place] Date [place]: 03/03/21 Time [place]: 50 Representative spoke with at [place]: Sharmon Revere  Prior Living Arrangements/Services Living arrangements for the  past 2 months: [place] Lives with:: Self Patient language and need for interpreter reviewed:: Yes Do you feel safe going back to the place where you live?: Yes      Need for Family Participation in Patient Care: Yes (Comment) Care giver support system in place?: Yes (comment)   Criminal Activity/Legal Involvement Pertinent to Current Situation/Hospitalization: No - Comment as needed  Activities of Daily Living Home Assistive Devices/Equipment: CBG Meter ADL Screening (condition at time of admission) Patient's cognitive ability adequate to safely complete daily activities?: Yes Is the patient deaf or have difficulty hearing?: No Does the patient have difficulty seeing, even when wearing glasses/contacts?: No Does the patient have difficulty concentrating, remembering, or making decisions?: No Patient able to express need for assistance with ADLs?: Yes Does the patient have difficulty dressing or bathing?: No Independently performs ADLs?: Yes (appropriate for developmental age) Does the patient have difficulty walking or climbing stairs?: No Weakness of Legs: None Weakness of Arms/Hands: None  Permission Sought/Granted Permission sought to share information with : Facility Art therapist granted to share information with : Yes, Verbal Permission Granted     Permission granted to share info w AGENCY: home health agency and DME        Emotional Assessment Appearance:: Appears older than stated age Attitude/Demeanor/Rapport: Engaged Affect (typically observed): Accepting Orientation: : Oriented to Self, Oriented to Place, Oriented to  Time, Oriented to Situation Alcohol / Substance Use: Not Applicable Psych Involvement: No (comment)  Admission diagnosis:  Atrial fibrillation with rapid ventricular response (HCC) [I48.91] Atrial fibrillation with RVR (HCC) [I48.91] Unstable angina ([place]) [I20.0]  Coronary artery disease [I25.10] Patient Active Problem  List   Diagnosis Date Noted   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   Coronary artery disease 02/26/2021   Malnutrition of moderate degree 02/26/2021   Ischemic cardiomyopathy    HFrEF (heart failure with reduced ejection fraction) ([place])    Acute on chronic HFrEF (heart failure with reduced ejection fraction) ([place])    Essential hypertension    Hypercholesterolemia    Type 2 diabetes mellitus with complication, with long-term current use of insulin (HCC)    Left bundle branch block    Atrial fibrillation with RVR ([place]) 02/21/2021   Unstable angina (HCC) 02/21/2021   Rectal bleeding 11/28/2019   Loose stools 11/28/2019   PCP:  Glenda Chroman, MD Pharmacy:   [facility] [place] [place] [address] Phone: [phone] Fax: [phone]     Social Determinants of Health (SDOH) Interventions    Readmission Risk Interventions No flowsheet data found.

## 2021-03-03 NOTE — Progress Notes (Signed)
CARDIAC REHAB PHASE I   PRE:  Rate/Rhythm: 65 SR    BP: sitting 136/65    SaO2: 100 RA  MODE:  Ambulation: 350 ft   POST:  Rate/Rhythm: 82 SR with PVCs    BP: sitting 127/85     SaO2: 95 RA   Pt with mod assist to get to EOB from lying. Stood without assist from elevated bed. Steady in hall with RW. Fatigue with distance. Return to bed, needed assist to lift legs. Pt daughter present and willing to help at home.   Discussed with pt and daughter IS, sternal precautions, diet, exercise, daily wts, and CRPII. Receptive. Will refer to [place].  [place], ACSM 03/03/2021 3:31 PM

## 2021-03-03 NOTE — Progress Notes (Signed)
Mobility Specialist: Progress Note   03/03/21 1823  Mobility  Activity Ambulated in hall  Level of Assistance Minimal assist, patient does 75% or more  Assistive Device Front wheel walker  Distance Ambulated (ft) 470 ft  Mobility Ambulated with assistance in hallway  Mobility Response Tolerated well  Mobility performed by Mobility specialist  Bed Position Chair  $Mobility charge 1 Mobility   Post-Mobility: 73 HR  Pt required minA to stand from EOB and mod I during ambulation. Pt asymptomatic throughout. Pt to recliner after walk per request with call bell at his side and family present in the room.   [facility] Derek Bond Mobility Specialist Mobility Specialist Phone: [phone]

## 2021-03-03 NOTE — Evaluation (Signed)
Occupational Therapy Evaluation Patient Details Name: Derek Bond MRN: [id number] DOB: 10/30/1945 Today's Date: 03/03/2021   History of Present Illness Pt is 75 yo male s/p CABG x 4, MAZE, LAA clip, intraoperative TEE with EF 35-40% on 02/26/21. Pt with hx of cardiomyopathy, chronic HFrEF, hypertension, diabetes mellitus, hyperlipidemia, former smoker, sleep apnea (not on CPAP), persistent atrial fibrillation.   Clinical Impression   PTA, pt lives alone and reports Independence in all daily tasks without use of AD. Pt reports his daughter plans to stay with him for at least 1 month to assist as needed at DC. Pt presents now with good carryover of sternal precautions, requiring intermittent Min A to stand from moderate height surfaces due to tall stature. Pt overall Min A for UB/LB ADLs due to deficits noted below. Anticipate pt to do well at home with family support and without need for OT follow-up at this time. Will continue to follow acutely to progress ADL independence with sternal precautions.   HR 70s-80s     Recommendations for follow up therapy are one component of a multi-disciplinary discharge planning process, led by the attending physician.  Recommendations may be updated based on patient status, additional functional criteria and insurance authorization.   Follow Up Recommendations  No OT follow up;Supervision - Intermittent    Equipment Recommendations  3 in 1 bedside commode;Other (comment) (Rolling walker)    Recommendations for Other Services       Precautions / Restrictions Precautions Precautions: Sternal Precaution Booklet Issued: Yes (comment) Restrictions Weight Bearing Restrictions: No      Mobility Bed Mobility Overal bed mobility: Needs Assistance Bed Mobility: Sit to Sidelying;Rolling Rolling: Supervision       Sit to sidelying: Min assist General bed mobility comments: to get LE back into bed    Transfers Overall transfer level: Needs  assistance Equipment used: Rolling walker (2 wheeled) Transfers: Sit to/from Stand Sit to Stand: Min assist         General transfer comment: Min A for sit to stand from recliner due to inability to push up from armrests secondary to sternal precautions. able to stand from toilet with BSC over it    Balance Overall balance assessment: Needs assistance Sitting-balance support: No upper extremity supported Sitting balance-Leahy Scale: Good     Standing balance support: No upper extremity supported Standing balance-Leahy Scale: Fair Standing balance comment: able to statically stand without support, benefits from RW for mobility/confidence                           ADL either performed or assessed with clinical judgement   ADL Overall ADL's : Needs assistance/impaired Eating/Feeding: Independent   Grooming: Supervision/safety;Standing;Oral care;Wash/dry face;Wash/dry hands   Upper Body Bathing: Minimal assistance;Sitting Upper Body Bathing Details (indicate cue type and reason): to bathe back without AE Lower Body Bathing: Min guard;Sit to/from stand   Upper Body Dressing : Set up;Sitting Upper Body Dressing Details (indicate cue type and reason): to don new gown Lower Body Dressing: Min guard;Sit to/from stand   Toilet Transfer: Min guard;Ambulation;RW;BSC;Regular Glass blower/designer Details (indicate cue type and reason): BSC over toilet, able to stand with hands on knees from this surface height Toileting- Clothing Manipulation and Hygiene: Minimal assistance;Sit to/from stand Toileting - Clothing Manipulation Details (indicate cue type and reason): for clothing mgmt, pt typically wipes seated but able to demo in standing today with safe reaching to posterior region  General ADL Comments: Pt with expected limitations due to sternal precautions but maintaining precautions well, minor cues for problem solving while maintaining sternal precautions      Vision Baseline Vision/History: 1 Wears glasses Ability to See in Adequate Light: 0 Adequate Patient Visual Report: No change from baseline Vision Assessment?: No apparent visual deficits     Perception     Praxis      Pertinent Vitals/Pain Pain Assessment: Faces Faces Pain Scale: Hurts a little bit Pain Location: sternum Pain Descriptors / Indicators: Discomfort Pain Intervention(s): Monitored during session     Hand Dominance Right   Extremity/Trunk Assessment Upper Extremity Assessment Upper Extremity Assessment: Overall WFL for tasks assessed   Lower Extremity Assessment Lower Extremity Assessment: Defer to PT evaluation   Cervical / Trunk Assessment Cervical / Trunk Assessment: Normal   Communication Communication Communication: No difficulties   Cognition Arousal/Alertness: Awake/alert Behavior During Therapy: WFL for tasks assessed/performed Overall Cognitive Status: Within Functional Limits for tasks assessed                                     General Comments  HR 70s with activity    Exercises     Shoulder Instructions      Home Living Family/patient expects to be discharged to:: Private residence Living Arrangements: Alone Available Help at Discharge: Family;Available 24 hours/day Type of Home: House Home Access: Level entry     Home Layout: Other (Comment) (overall 1 level but does have 1-2 steps in places to get in certain rooms)     Bathroom Shower/Tub: Walk-in shower;Tub/shower unit   Bathroom Toilet: Handicapped height Bathroom Accessibility: Yes   Home Equipment: Cane - single point   Additional Comments: daughter plans to stay with pt for 1 month after surgery, has another daughter and grandsons that can assist as well      Prior Functioning/Environment Level of Independence: Independent        Comments: active with his church, 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         OT Problem List: Decreased strength;Decreased activity tolerance;Impaired balance (sitting and/or standing)      OT Treatment/Interventions: Self-care/ADL training;Therapeutic exercise;DME and/or AE instruction;Energy conservation;Therapeutic activities;Patient/family education;Balance training    OT Goals(Current goals can be found in the care plan section) Acute Rehab OT Goals Patient Stated Goal: return home; dtr staying with him initially OT Goal Formulation: With patient Time For Goal Achievement: 03/17/21 Potential to Achieve Goals: Good  OT Frequency: Min 2X/week   Barriers to D/C:            Co-evaluation              AM-PAC OT "6 Clicks" Daily Activity     Outcome Measure Help from another person eating meals?: None Help from another person taking care of personal grooming?: A Little Help from another person toileting, which includes using toliet, bedpan, or urinal?: A Little Help from another person bathing (including washing, rinsing, drying)?: A Little Help from another person to put on and taking off regular upper body clothing?: A Little Help from another person to put on and taking off regular lower body clothing?: A Little 6 Click Score: 19   End of Session Equipment Utilized During Treatment: Gait belt;Rolling walker Nurse Communication: Mobility status  Activity Tolerance: Patient tolerated treatment well Patient left: in bed;with call bell/phone within  reach;with bed alarm set  OT Visit Diagnosis: Unsteadiness on feet (R26.81);Other abnormalities of gait and mobility (R26.89);Muscle weakness (generalized) (M62.81)                Time: 7460-0298 OT Time Calculation (min): 32 min Charges:  OT General Charges $OT Visit: 1 Visit OT Evaluation $OT Eval Moderate Complexity: 1 Mod OT Treatments $Self Care/Home Management : 8-22 mins  Malachy Chamber, OTR/L Acute Rehab Services Office: [phone]   Layla Maw 03/03/2021, 10:33 AM

## 2021-03-03 NOTE — Progress Notes (Addendum)
      [address]       [place] [address]             [phone]      5 Days Post-Op Procedure(s) (LRB): CORONARY ARTERY BYPASS GRAFTING (CABG) X4, ON PUMP, USING LEFT INTERNAL MAMMARY ARTERY AND RIGHT ENDOSCOPICALLY HARVESTED GREATER SAPHENOUS VEIN (N/A) CLIPPING OF ATRIAL APPENDAGE USING ATRICURE CLIP SIZE 45 (N/A) MAZE (N/A) TRANSESOPHAGEAL ECHOCARDIOGRAM (TEE) (N/A) APPLICATION OF CELL SAVER (N/A) ENDOVEIN HARVEST OF GREATER SAPHENOUS VEIN (Right) Subjective: Feels okay this morning, no complaints  Objective: Vital signs in last 24 hours: Temp:  [97.4 F (36.3 C)-98.4 F (36.9 C)] 97.8 F (36.6 C) (10/18 0327) Pulse Rate:  [62-75] 69 (10/18 0327) Cardiac Rhythm: Normal sinus rhythm;Bundle branch block (10/17 2035) Resp:  [15-20] 17 (10/18 0327) BP: (118-151)/(65-89) 151/89 (10/18 0327) SpO2:  [96 %-100 %] 100 % (10/18 0327) Weight:  [104.1 kg] 104.1 kg (10/18 0327)     Intake/Output from previous day: 10/17 0701 - 10/18 0700 In: 0  Out: 300 [Urine:300] Intake/Output this shift: No intake/output data recorded.  General appearance: alert, cooperative, and no distress Heart: regular rate and rhythm, S1, S2 normal, no murmur, click, rub or gallop Lungs: clear to auscultation bilaterally Abdomen: soft, non-tender; bowel sounds normal; no masses,  no organomegaly Extremities: extremities normal, atraumatic, no cyanosis or edema Wound: clean and dry  Lab Results: Recent Labs    03/01/21 0425 03/02/21 0126  WBC 7.1 6.7  HGB 11.5* 12.9*  HCT 34.7* 39.0  PLT 166 171   BMET:  Recent Labs    03/01/21 0425 03/02/21 0126  NA 133* 131*  K 4.6 4.4  CL 102 102  CO2 26 23  GLUCOSE 148* 174*  BUN 22 22  CREATININE 0.75 0.76  CALCIUM 8.9 8.8*    PT/INR: No results for input(s): LABPROT, INR in the last 72 hours. ABG    Component Value Date/Time   PHART 7.339 (L) 02/26/2021 2144   HCO3 18.2 (L) 02/26/2021 2144   TCO2 19 (L) 02/26/2021  2144   ACIDBASEDEF 7.0 (H) 02/26/2021 2144   O2SAT 99.0 02/26/2021 2144   CBG (last 3)  Recent Labs    03/02/21 1637 03/02/21 2118 03/03/21 0602  GLUCAP 278* 232* 185*    Assessment/Plan: S/P Procedure(s) (LRB): CORONARY ARTERY BYPASS GRAFTING (CABG) X4, ON PUMP, USING LEFT INTERNAL MAMMARY ARTERY AND RIGHT ENDOSCOPICALLY HARVESTED GREATER SAPHENOUS VEIN (N/A) CLIPPING OF ATRIAL APPENDAGE USING ATRICURE CLIP SIZE 45 (N/A) MAZE (N/A) TRANSESOPHAGEAL ECHOCARDIOGRAM (TEE) (N/A) APPLICATION OF CELL SAVER (N/A) ENDOVEIN HARVEST OF GREATER SAPHENOUS VEIN (Right)  CV: post-op Afib. Now NSR in the 60s. Converted Amio to PO.  Continue low dose coreg.  EPW removed.  Discontinue midodrine. May need to start Entresto back in remains hypertensive.  Pulm: continue pulm hygiene. CXR yesterday stable with atelectasis and small left pleural effusion.  Renal: creat stable.  Continue lasix 40mg  daily for fluid overload.  Endo: hyperglycemia will increase long-acting insulin. Continue SSI coverage Heme: stable 12.9/39.0 ID: afebrile  Plan: Continue ambulation in the halls. Will place home health PT orders. Hopeful for home in the next 1-2 days.     LOS: 10 days    Derek Bond 03/03/2021  Dispo planning Derek Bond Derek Bond

## 2021-03-03 NOTE — Progress Notes (Signed)
Nutrition Follow-up  DOCUMENTATION CODES:   Non-severe (moderate) malnutrition in context of chronic illness  INTERVENTION:   Continue Ensure Enlive po BID, each supplement provides 350 kcal and 20 grams of protein  NUTRITION DIAGNOSIS:   Moderate Malnutrition related to chronic illness as evidenced by mild fat depletion, moderate muscle depletion.  Ongoing   GOAL:   Patient will meet greater than or equal to 90% of their needs  Progressing   MONITOR:   PO intake, Diet advancement, Weight trends  REASON FOR ASSESSMENT:   Malnutrition Screening Tool    ASSESSMENT:   Pt admitted to hospital with SOB, chest pain, and fatigue and determined secondary to afib. He was previously scheduled for cardiac catheterization for 02/24/21. Plans for possible CABG. PMH includes cardiomyopathy, chronic HFrEF, HTN, T2DM, hyperlipidemia, former smoker, sleep apnea and persistent afib.  S/P CABG x 4 on 10/13. Patient is on a carbohydrate modified diet. He reports eating well, consuming at least 75% of all meals. He is drinking Ensure supplements twice daily.  Labs reviewed. Na 131 (10/17) CBG: 185-253 today  Medications reviewed and include Lasix, Novolog, Semglee, Protonix, potassium chloride.  Admission weight 93.8 kg Current weight 104.1 kg Weight up since admission r/t fluid status  Diet Order:   Diet Order             Diet Carb Modified Fluid consistency: Thin; Room service appropriate? Yes  Diet effective now                   EDUCATION NEEDS:   Education needs have been addressed  Skin:  Skin Assessment: Reviewed RN Assessment (surgical incisions to chest, leg, groin)  Last BM:  10/17  Height:   Ht Readings from Last 1 Encounters:  02/21/21 6\' 6"  (1.981 m)    Weight:   Wt Readings from Last 1 Encounters:  03/03/21 104.1 kg    BMI:  Body mass index is 26.52 kg/m.  Estimated Nutritional Needs:   Kcal:  6283-1517  Protein:  115-130g  Fluid:   >2.3L    Lucas Mallow, RD, LDN, CNSC Please refer to Amion for contact information.

## 2021-03-03 NOTE — Progress Notes (Signed)
Mobility Specialist Progress Note:   03/03/21 1125  Mobility  Activity Ambulated in hall  Level of Assistance Minimal assist, patient does 75% or more  Assistive Device Front wheel walker  Distance Ambulated (ft) 430 ft  Mobility Ambulated with assistance in hallway  Mobility Response Tolerated well  Mobility performed by Mobility specialist  Bed Position Chair  $Mobility charge 1 Mobility   Pre- Mobility:  67 HR; 138/75 BP; 98% SpO2 Post Mobility:   70 HR;  130/75 BP;  95% SpO2  Pt received in bed willing to participate in mobility. No complaints of pain, weakness or dizziness. MinA to stand and supervision during ambulation. Pt requesting to use bathroom before returning to chair. Pt left in chair with call bell in reach, all needs met and visitors present.   [facility] Health and safety inspector Phone [phone]

## 2021-03-04 ENCOUNTER — Other Ambulatory Visit: Payer: Self-pay

## 2021-03-04 DIAGNOSIS — I5021 Acute systolic (congestive) heart failure: Secondary | ICD-10-CM

## 2021-03-04 LAB — GLUCOSE, CAPILLARY
Glucose-Capillary: 239 mg/dL — ABNORMAL HIGH (ref 70–99)
Glucose-Capillary: 299 mg/dL — ABNORMAL HIGH (ref 70–99)

## 2021-03-04 MED ORDER — TRESIBA FLEXTOUCH 100 UNIT/ML ~~LOC~~ SOPN: 25.0000 [IU] | PEN_INJECTOR | Freq: Every day | SUBCUTANEOUS | 0 refills | Status: DC

## 2021-03-04 MED ORDER — AMIODARONE HCL 200 MG PO TABS: 400.0000 mg | ORAL_TABLET | Freq: Two times a day (BID) | ORAL | 1 refills | Status: DC

## 2021-03-04 MED ORDER — POTASSIUM CHLORIDE CRYS ER 20 MEQ PO TBCR: 20.0000 meq | EXTENDED_RELEASE_TABLET | Freq: Every day | ORAL | 0 refills | Status: DC

## 2021-03-04 MED ORDER — LIVING WELL WITH DIABETES BOOK
Freq: Once | Status: AC
  Filled 2021-03-04: qty 1

## 2021-03-04 MED ORDER — CARVEDILOL 3.125 MG PO TABS: 3.1250 mg | ORAL_TABLET | Freq: Two times a day (BID) | ORAL | 1 refills | Status: DC

## 2021-03-04 MED ORDER — TRAMADOL HCL 50 MG PO TABS: 50.0000 mg | ORAL_TABLET | ORAL | 0 refills | Status: DC | PRN

## 2021-03-04 MED ORDER — FUROSEMIDE 40 MG PO TABS: 40.0000 mg | ORAL_TABLET | Freq: Every day | ORAL | 0 refills | Status: DC

## 2021-03-04 MED FILL — Albumin, Human Inj 5%: INTRAVENOUS | Qty: 250 | Status: AC

## 2021-03-04 MED FILL — Heparin Sodium (Porcine) Inj 1000 Unit/ML: INTRAMUSCULAR | Qty: 40 | Status: AC

## 2021-03-04 MED FILL — Mannitol IV Soln 20%: INTRAVENOUS | Qty: 500 | Status: AC

## 2021-03-04 MED FILL — Calcium Chloride Inj 10%: INTRAVENOUS | Qty: 10 | Status: AC

## 2021-03-04 MED FILL — Sodium Bicarbonate IV Soln 8.4%: INTRAVENOUS | Qty: 50 | Status: AC

## 2021-03-04 MED FILL — Sodium Chloride IV Soln 0.9%: INTRAVENOUS | Qty: 2000 | Status: AC

## 2021-03-04 MED FILL — Electrolyte-R (PH 7.4) Solution: INTRAVENOUS | Qty: 5000 | Status: AC

## 2021-03-04 NOTE — TOC Transition Note (Signed)
Transition of Care (TOC) - CM/SW Discharge Note Marvetta Gibbons RN, BSN Transitions of Care Unit 4E- RN Case Manager See Treatment Team for direct phone #    Patient Details  Name: Derek Bond MRN: [id number] Date of Birth: [birth year]  Transition of Care [facility]) CM/SW Contact:  [name], RN Phone Number: 03/04/2021, 11:19 AM   Clinical Narrative:    Pt stable for transition home today, HH has been set up with [facility], [place] office will call pt to schedule home visits- HH order placed for HHPT. DME RW has been delivered to the room, pt now also requesting BSC for home- order placed and [facility] called for DME BSC need- BSC to be delivered today prior to discharge.   Daughter to assist post discharge.    Final next level of care: [place] Barriers to Discharge: Barriers Resolved   Patient Goals and CMS Choice Patient states their goals for this hospitalization and ongoing recovery are:: return home CMS Medicare.gov Compare Post Acute Care list provided to:: Patient Choice offered to / list presented to : Patient  Discharge Placement               Home w/ [facility]        Discharge Plan and Services   Discharge Planning Services: CM Consult Post Acute Care Choice: Durable Medical Equipment, Home Health          DME Arranged: Bedside commode DME Agency: [facility] Date DME Agency Contacted: 03/04/21 Time DME Agency Contacted: 1000 Representative spoke with at DME Agency: Freda Munro HH Arranged: PT [place]: [place] Date [place]: 03/03/21 Time [place]: 1305 Representative spoke with at [place]: [place] Interventions     Readmission Risk Interventions Readmission Risk Prevention Plan 03/04/2021  Transportation Screening Complete  PCP or Specialist Appt within 5-7 Days Complete  Home Care Screening Complete  Medication Review (RN CM) Complete  Some  recent data might be hidden

## 2021-03-04 NOTE — Progress Notes (Addendum)
Inpatient Diabetes Program Recommendations  AACE/ADA: New Consensus Statement on Inpatient Glycemic Control   Target Ranges:  Prepandial:   less than 140 mg/dL      Peak postprandial:   less than 180 mg/dL (1-2 hours)      Critically ill patients:  140 - 180 mg/dL   Results for Derek Bond, Derek Bond (MRN [id number]) as of 03/04/2021 09:28  Ref. Range 03/03/2021 06:02 03/03/2021 11:41 03/03/2021 16:26 03/03/2021 21:08 03/04/2021 06:04  Glucose-Capillary Latest Ref Range: 70 - 99 mg/dL 185 (H) 253 (H) 214 (H) 261 (H) 239 (H)   Results for Derek Bond, Derek Bond (MRN [id number]) as of 03/04/2021 09:28  Ref. Range 02/22/2021 01:31  Hemoglobin A1C Latest Ref Range: 4.8 - 5.6 % 8.1 (H)   Review of Glycemic Control  Diabetes history: DM2 Outpatient Diabetes medications: Tresiba 48 units daily, Farxiga 10 mg daily, Glipizide XL 10 mg BID, Metformin 1000 mg BID Current orders for Inpatient glycemic control: Semglee 15 units daily, Novolog 0-15 units TID with meals  Inpatient Diabetes Program Recommendations:    Insulin: Please consider increasing Semglee to 20 units daily and Novolog 4 units TID with meals for meal coverage.  Discharge DM medications: Would recommend discharging on Tresiba 25  units daily along with same outpatient oral DM medication regimen and have patient follow up with PCP for DM medication adjustments based on glucose trends as outpatient.  Addendum 03/04/21@10 :15-Spoke with patient and daughter at bedside. Patient's daughter states she will be staying with patient for at least 4 to 6 weeks after discharge. Patient reports that he is consistently taking DM medications as noted above. Patient reports that his Derek Bond dose was decreased from 58 to 48 units daily about 3 months ago and that he was started on Farxiga by outpatient cardiology a couple of months ago. Patient reports that he checks glucose at least 2 times per day. Patient reports that since he decreased Tresiba dose to 48 units  daily, he does not have hypoglycemia very often (maybe once a week when fasting is in the mid 70's). Patient reports that if fasting is in mid 70's he may cut Tresiba dose in half just that one day. Discussed current insulin regimen as an inpatient and that providers are trying to decide what to send him home on for DM management. Discussed importance of tight glucose control following CABG. Inquired about any knowledge about Derek Libre2 and patient reports he has seen some information on TV about it. Discussed Derek Libre2 and explained how it works and information it would provide. Patient is interested in using Derek Bond, [place] provided order to provide samples of Derek Libre2 reader and sensors). Educated patient and his daughter on [place] and assisted patient's daughter to apply Derek Libre2 to back of patient's upper left arm. Provided additional Derek Libre2 sensor and asked that patient talk with PCP about prescribing for him. Explained that the CGM will alarm if glucose is less than 70 or over 240 mg/dl. Discussed glucose and A1C goals. Discussed hypoglycemia and encouraged patient to contact PCP if he has any issues with hypoglycemia or if glucose is consistently over 200 mg/dl.  Discussed Carb Modified diet. Will order Living Well with DM book for patient's daughter. Patient and daughter verbalized understanding of information and state they have no questions at this time.   Thanks, Barnie Alderman, RN, MSN, CDE Diabetes Coordinator Inpatient Diabetes Program [phone] (Team Pager from 8am to 5pm)

## 2021-03-04 NOTE — Progress Notes (Signed)
      [address]       [place] [address]             [phone]        6 Days Post-Op Procedure(s) (LRB): CORONARY ARTERY BYPASS GRAFTING (CABG) X4, ON PUMP, USING LEFT INTERNAL MAMMARY ARTERY AND RIGHT ENDOSCOPICALLY HARVESTED GREATER SAPHENOUS VEIN (N/A) CLIPPING OF ATRIAL APPENDAGE USING ATRICURE CLIP SIZE 45 (N/A) MAZE (N/A) TRANSESOPHAGEAL ECHOCARDIOGRAM (TEE) (N/A) APPLICATION OF CELL SAVER (N/A) ENDOVEIN HARVEST OF GREATER SAPHENOUS VEIN (Right) Subjective: Feels okay this morning and wants to go home.   Objective: Vital signs in last 24 hours: Temp:  [97.6 F (36.4 C)-98.2 F (36.8 C)] 98.2 F (36.8 C) (10/19 0347) Pulse Rate:  [64-73] 69 (10/19 0347) Cardiac Rhythm: Normal sinus rhythm (10/18 1943) Resp:  [14-20] 20 (10/19 0347) BP: (113-146)/(67-86) 146/74 (10/19 0347) SpO2:  [97 %-100 %] 100 % (10/19 0347) Weight:  [105.8 kg] 105.8 kg (10/19 0351)    Intake/Output from previous day: 10/18 0701 - 10/19 0700 In: -  Out: 351 [Urine:350; Stool:1] Intake/Output this shift: No intake/output data recorded.  General appearance: alert, cooperative, and no distress Heart: irregularly irregular rhythm Lungs: clear to auscultation bilaterally Abdomen: soft, non-tender; bowel sounds normal; no masses,  no organomegaly Extremities: 1-2+ pitting edema in bilateral lower ext Wound: clean and dry  Lab Results: Recent Labs    03/02/21 0126  WBC 6.7  HGB 12.9*  HCT 39.0  PLT 171   BMET:  Recent Labs    03/02/21 0126  NA 131*  K 4.4  CL 102  CO2 23  GLUCOSE 174*  BUN 22  CREATININE 0.76  CALCIUM 8.8*    PT/INR: No results for input(s): LABPROT, INR in the last 72 hours. ABG    Component Value Date/Time   PHART 7.339 (L) 02/26/2021 2144   HCO3 18.2 (L) 02/26/2021 2144   TCO2 19 (L) 02/26/2021 2144   ACIDBASEDEF 7.0 (H) 02/26/2021 2144   O2SAT 99.0 02/26/2021 2144   CBG (last 3)  Recent Labs    03/03/21 1626 03/03/21 2108  03/04/21 0604  GLUCAP 214* 261* 239*    Assessment/Plan: S/P Procedure(s) (LRB): CORONARY ARTERY BYPASS GRAFTING (CABG) X4, ON PUMP, USING LEFT INTERNAL MAMMARY ARTERY AND RIGHT ENDOSCOPICALLY HARVESTED GREATER SAPHENOUS VEIN (N/A) CLIPPING OF ATRIAL APPENDAGE USING ATRICURE CLIP SIZE 45 (N/A) MAZE (N/A) TRANSESOPHAGEAL ECHOCARDIOGRAM (TEE) (N/A) APPLICATION OF CELL SAVER (N/A) ENDOVEIN HARVEST OF GREATER SAPHENOUS VEIN (Right)  CV: post-op Afib. Now rate-controlled Afib in the 70s. Converted Amio to PO.  Continue low dose coreg.  EPW removed.  Discontinue midodrine. May need to start Entresto but BP well controlled at the moment.  Pulm: continue pulm hygiene. CXR yesterday stable with atelectasis and small left pleural effusion.  Renal: creat stable.  Continue lasix 40mg  daily for fluid overload.  Endo: hyperglycemia will increase long-acting insulin. Continue SSI coverage Heme: stable 12.9/39.0 ID: [email or web address]  Plan: Discharge instructions reviewed with the patient today. His appointments will be on his d/c paperwork.     LOS: 11 days    Elgie Collard 03/04/2021

## 2021-03-04 NOTE — Progress Notes (Signed)
Pt discharged from unit. Medication/discharge instruction given  Treon Kehl K Zong Mcquarrie, RN  

## 2021-03-04 NOTE — Progress Notes (Signed)
Physical Therapy Treatment Patient Details Name: Derek Bond MRN: [id number] DOB: [birth year] Today's Date: 03/04/2021   History of Present Illness Pt is 75 yo male s/p CABG x 4, MAZE, LAA clip, intraoperative TEE with EF 35-40% on 02/26/21. Pt with hx of cardiomyopathy, chronic HFrEF, hypertension, diabetes mellitus, hyperlipidemia, former smoker, sleep apnea (not on CPAP), persistent atrial fibrillation.    PT Comments    Pt displays improved activity tolerance, ambulating an increased distance of up to ~325 ft this date. In addition, he was able to negotiate x2 stairs with his RW with min guard assist. Reviewed sternal precautions, with pt displaying good compliance throughout. Will continue to follow acutely. Current recommendations remain appropriate. Pt requesting 3-in-1 at d/c to improve independence with transfers off the commode.    Recommendations for follow up therapy are one component of a multi-disciplinary discharge planning process, led by the attending physician.  Recommendations may be updated based on patient status, additional functional criteria and insurance authorization.  Follow Up Recommendations  Supervision/Assistance - 24 hour;Home health PT     Equipment Recommendations  Rolling walker with 5" wheels;3in1 (PT)    Recommendations for Other Services       Precautions / Restrictions Precautions Precautions: Sternal Precaution Booklet Issued: Yes (comment) Precaution Comments: reviewed sternal precautions, pt compliant throughout Restrictions Weight Bearing Restrictions: No     Mobility  Bed Mobility Overal bed mobility: Needs Assistance Bed Mobility: Sit to Supine       Sit to supine: Min assist   General bed mobility comments: MinA to manage legs back onto bed.    Transfers Overall transfer level: Needs assistance Equipment used: Rolling walker (2 wheeled) Transfers: Sit to/from Stand Sit to Stand: Min assist         General transfer  comment: MinA fro sit to stand from recliner 1x and commode 1x, cuing pt to scoot anteriorly, gain nose over toes, and keep hands on lap to maintain sternal precautions.  Ambulation/Gait Ambulation/Gait assistance: Supervision Gait Distance (Feet): 325 Feet (x2 bouts of ~20 ft > ~325 ft) Assistive device: Rolling walker (2 wheeled) Gait Pattern/deviations: Step-through pattern;Decreased stride length Gait velocity: decreased Gait velocity interpretation: <1.8 ft/sec, indicate of risk for recurrent falls General Gait Details: Pt with slow, but steady gait with no LOB. Cued pt to change head positions to observe his surroundings, still no LOB. Supervision for safety.   Stairs Stairs: Yes Stairs assistance: Min guard Stair Management: One rail Right;Step to pattern;Forwards;With walker Number of Stairs: 2 General stair comments: Ascends with RW turned sideways in R hand and no use of rails, no LOB. Descends with RW sideways in L hand and use of R handrail with 1/2 of stairs. No LOB, min guard for safety. Educated pt to have someone hold RW for stability with stairs at home.   Wheelchair Mobility    Modified Rankin (Stroke Patients Only)       Balance Overall balance assessment: Needs assistance Sitting-balance support: No upper extremity supported Sitting balance-Leahy Scale: Good     Standing balance support: No upper extremity supported Standing balance-Leahy Scale: Fair Standing balance comment: able to statically stand without support, benefits from RW for mobility/confidence                            Cognition Arousal/Alertness: Awake/alert Behavior During Therapy: WFL for tasks assessed/performed Overall Cognitive Status: Within Functional Limits for tasks assessed  Exercises      General Comments General comments (skin integrity, edema, etc.): HR 70s with activity      Pertinent Vitals/Pain Pain  Assessment: Faces Faces Pain Scale: Hurts a little bit Pain Location: sternum Pain Descriptors / Indicators: Discomfort;Operative site guarding Pain Intervention(s): Limited activity within patient's tolerance;Monitored during session;Repositioned    Home Living                      Prior Function            PT Goals (current goals can now be found in the care plan section) Acute Rehab PT Goals Patient Stated Goal: to go home PT Goal Formulation: With patient/family Time For Goal Achievement: 03/16/21 Potential to Achieve Goals: Good Progress towards PT goals: Progressing toward goals    Frequency    Min 3X/week      PT Plan Equipment recommendations need to be updated    Co-evaluation              AM-PAC PT "6 Clicks" Mobility   Outcome Measure  Help needed turning from your back to your side while in a flat bed without using bedrails?: A Little Help needed moving from lying on your back to sitting on the side of a flat bed without using bedrails?: A Little Help needed moving to and from a bed to a chair (including a wheelchair)?: A Little Help needed standing up from a chair using your arms (e.g., wheelchair or bedside chair)?: A Little Help needed to walk in hospital room?: A Little Help needed climbing 3-5 steps with a railing? : A Little 6 Click Score: 18    End of Session Equipment Utilized During Treatment: Gait belt Activity Tolerance: Patient tolerated treatment well Patient left: in bed;with call bell/phone within reach;with bed alarm set;with family/visitor present Nurse Communication: Mobility status PT Visit Diagnosis: Other abnormalities of gait and mobility (R26.89);Unsteadiness on feet (R26.81);Difficulty in walking, not elsewhere classified (R26.2)     Time: 1111-1130 PT Time Calculation (min) (ACUTE ONLY): 19 min  Charges:  $Gait Training: 8-22 mins                     Moishe Spice, PT, DPT Acute Rehabilitation  Services  Pager: [phone] Office: [place] 03/04/2021, 12:05 PM

## 2021-03-05 DIAGNOSIS — I10 Essential (primary) hypertension: Secondary | ICD-10-CM | POA: Diagnosis not present

## 2021-03-05 DIAGNOSIS — R531 Weakness: Secondary | ICD-10-CM | POA: Diagnosis not present

## 2021-03-05 DIAGNOSIS — E1165 Type 2 diabetes mellitus with hyperglycemia: Secondary | ICD-10-CM | POA: Diagnosis not present

## 2021-03-05 DIAGNOSIS — Z09 Encounter for follow-up examination after completed treatment for conditions other than malignant neoplasm: Secondary | ICD-10-CM | POA: Diagnosis not present

## 2021-03-05 DIAGNOSIS — I2510 Atherosclerotic heart disease of native coronary artery without angina pectoris: Secondary | ICD-10-CM | POA: Diagnosis not present

## 2021-03-06 ENCOUNTER — Other Ambulatory Visit: Payer: Self-pay

## 2021-03-06 ENCOUNTER — Ambulatory Visit (INDEPENDENT_AMBULATORY_CARE_PROVIDER_SITE_OTHER): Payer: Self-pay | Admitting: Thoracic Surgery (Cardiothoracic Vascular Surgery)

## 2021-03-06 ENCOUNTER — Telehealth: Payer: Self-pay | Admitting: Thoracic Surgery (Cardiothoracic Vascular Surgery)

## 2021-03-06 DIAGNOSIS — Z951 Presence of aortocoronary bypass graft: Secondary | ICD-10-CM

## 2021-03-06 NOTE — Progress Notes (Signed)
     [address]       [place] [address]             [phone]       Patient: Home Provider: Office Consent for Telemedicine visit obtained.  Today's visit was completed via a real-time telehealth (see specific modality noted below). The patient/authorized person provided oral consent at the time of the visit to engage in a telemedicine encounter with the present provider at [facility]. The patient/authorized person was informed of the potential benefits, limitations, and risks of telemedicine. The patient/authorized person expressed understanding that the laws that protect confidentiality also apply to telemedicine. The patient/authorized person acknowledged understanding that telemedicine does not provide emergency services and that he or she would need to call 911 or proceed to the nearest hospital for help if such a need arose.   Total time spent in the clinical discussion 10 minutes.  Telehealth Modality: Phone visit (audio only)  I had a telephone visit with Mr. Derek Bond and his daughter.  Overall he is doing well.  He only complains of some swelling in both of his feet.  He does have some shortness of breath when he lays flat but he sits up he denies any issues.  He is scheduled to meet with his cardiologist in the next week.  We will plan to see him in follow-up in 1 month with a chest x-ray.  If that all looks good then he can be cleared for cardiac rehab and to resume normal activities.  [name]

## 2021-03-09 ENCOUNTER — Other Ambulatory Visit: Payer: Self-pay

## 2021-03-09 MED ORDER — POTASSIUM CHLORIDE CRYS ER 20 MEQ PO TBCR: 20.0000 meq | EXTENDED_RELEASE_TABLET | Freq: Every day | ORAL | 0 refills | Status: DC

## 2021-03-09 MED ORDER — FUROSEMIDE 40 MG PO TABS: 40.0000 mg | ORAL_TABLET | Freq: Every day | ORAL | 0 refills | Status: DC

## 2021-03-10 ENCOUNTER — Telehealth: Payer: Self-pay | Admitting: *Deleted

## 2021-03-10 DIAGNOSIS — I4819 Other persistent atrial fibrillation: Secondary | ICD-10-CM | POA: Diagnosis not present

## 2021-03-10 DIAGNOSIS — I110 Hypertensive heart disease with heart failure: Secondary | ICD-10-CM | POA: Diagnosis not present

## 2021-03-10 DIAGNOSIS — I429 Cardiomyopathy, unspecified: Secondary | ICD-10-CM | POA: Diagnosis not present

## 2021-03-10 DIAGNOSIS — I447 Left bundle-branch block, unspecified: Secondary | ICD-10-CM | POA: Diagnosis not present

## 2021-03-10 DIAGNOSIS — Z48812 Encounter for surgical aftercare following surgery on the circulatory system: Secondary | ICD-10-CM | POA: Diagnosis not present

## 2021-03-10 DIAGNOSIS — E785 Hyperlipidemia, unspecified: Secondary | ICD-10-CM | POA: Diagnosis not present

## 2021-03-10 DIAGNOSIS — I25110 Atherosclerotic heart disease of native coronary artery with unstable angina pectoris: Secondary | ICD-10-CM | POA: Diagnosis not present

## 2021-03-10 DIAGNOSIS — I5023 Acute on chronic systolic (congestive) heart failure: Secondary | ICD-10-CM | POA: Diagnosis not present

## 2021-03-10 DIAGNOSIS — E119 Type 2 diabetes mellitus without complications: Secondary | ICD-10-CM | POA: Diagnosis not present

## 2021-03-10 NOTE — Telephone Encounter (Signed)
Patient's daughter, Hoyle Sauer, contacted the office stating her father is experiencing heart burn. Attempted to call Hoyle Sauer back on the number provided but was unsuccessful. Contacted patient himself. Patient states he has experienced indigestion and feeling as if he has to burp. Patient has a prescription for Protonix but is unsure if he is taking it everyday. Advised patient to continue taking Protonix. Advised he may take Tums if needed for indigestion. Patient verbalizes understanding.

## 2021-03-12 DIAGNOSIS — I447 Left bundle-branch block, unspecified: Secondary | ICD-10-CM | POA: Diagnosis not present

## 2021-03-12 DIAGNOSIS — Z48812 Encounter for surgical aftercare following surgery on the circulatory system: Secondary | ICD-10-CM | POA: Diagnosis not present

## 2021-03-12 DIAGNOSIS — E119 Type 2 diabetes mellitus without complications: Secondary | ICD-10-CM | POA: Diagnosis not present

## 2021-03-12 DIAGNOSIS — I4819 Other persistent atrial fibrillation: Secondary | ICD-10-CM | POA: Diagnosis not present

## 2021-03-12 DIAGNOSIS — E785 Hyperlipidemia, unspecified: Secondary | ICD-10-CM | POA: Diagnosis not present

## 2021-03-12 DIAGNOSIS — I429 Cardiomyopathy, unspecified: Secondary | ICD-10-CM | POA: Diagnosis not present

## 2021-03-12 DIAGNOSIS — I25110 Atherosclerotic heart disease of native coronary artery with unstable angina pectoris: Secondary | ICD-10-CM | POA: Diagnosis not present

## 2021-03-12 DIAGNOSIS — I5023 Acute on chronic systolic (congestive) heart failure: Secondary | ICD-10-CM | POA: Diagnosis not present

## 2021-03-12 DIAGNOSIS — I110 Hypertensive heart disease with heart failure: Secondary | ICD-10-CM | POA: Diagnosis not present

## 2021-03-13 ENCOUNTER — Other Ambulatory Visit: Payer: Self-pay

## 2021-03-13 ENCOUNTER — Ambulatory Visit: Payer: Medicare PPO | Admitting: Cardiology

## 2021-03-13 ENCOUNTER — Encounter: Payer: Self-pay | Admitting: Cardiology

## 2021-03-13 VITALS — BP 117/77 | HR 84 | Temp 97.8°F | Resp 16 | Ht 78.0 in | Wt 209.0 lb

## 2021-03-13 DIAGNOSIS — Z9889 Other specified postprocedural states: Secondary | ICD-10-CM | POA: Diagnosis not present

## 2021-03-13 DIAGNOSIS — E1129 Type 2 diabetes mellitus with other diabetic kidney complication: Secondary | ICD-10-CM | POA: Diagnosis not present

## 2021-03-13 DIAGNOSIS — I4819 Other persistent atrial fibrillation: Secondary | ICD-10-CM | POA: Diagnosis not present

## 2021-03-13 DIAGNOSIS — I447 Left bundle-branch block, unspecified: Secondary | ICD-10-CM | POA: Diagnosis not present

## 2021-03-13 DIAGNOSIS — Z7901 Long term (current) use of anticoagulants: Secondary | ICD-10-CM | POA: Diagnosis not present

## 2021-03-13 DIAGNOSIS — Z48812 Encounter for surgical aftercare following surgery on the circulatory system: Secondary | ICD-10-CM | POA: Diagnosis not present

## 2021-03-13 DIAGNOSIS — I5021 Acute systolic (congestive) heart failure: Secondary | ICD-10-CM

## 2021-03-13 DIAGNOSIS — I2510 Atherosclerotic heart disease of native coronary artery without angina pectoris: Secondary | ICD-10-CM | POA: Diagnosis not present

## 2021-03-13 DIAGNOSIS — E782 Mixed hyperlipidemia: Secondary | ICD-10-CM

## 2021-03-13 DIAGNOSIS — Z951 Presence of aortocoronary bypass graft: Secondary | ICD-10-CM

## 2021-03-13 DIAGNOSIS — G4730 Sleep apnea, unspecified: Secondary | ICD-10-CM

## 2021-03-13 DIAGNOSIS — I255 Ischemic cardiomyopathy: Secondary | ICD-10-CM | POA: Diagnosis not present

## 2021-03-13 DIAGNOSIS — I25110 Atherosclerotic heart disease of native coronary artery with unstable angina pectoris: Secondary | ICD-10-CM | POA: Diagnosis not present

## 2021-03-13 DIAGNOSIS — E785 Hyperlipidemia, unspecified: Secondary | ICD-10-CM | POA: Diagnosis not present

## 2021-03-13 DIAGNOSIS — Z87891 Personal history of nicotine dependence: Secondary | ICD-10-CM

## 2021-03-13 DIAGNOSIS — I10 Essential (primary) hypertension: Secondary | ICD-10-CM

## 2021-03-13 DIAGNOSIS — I429 Cardiomyopathy, unspecified: Secondary | ICD-10-CM | POA: Diagnosis not present

## 2021-03-13 DIAGNOSIS — Z8679 Personal history of other diseases of the circulatory system: Secondary | ICD-10-CM

## 2021-03-13 DIAGNOSIS — E119 Type 2 diabetes mellitus without complications: Secondary | ICD-10-CM | POA: Diagnosis not present

## 2021-03-13 DIAGNOSIS — Z794 Long term (current) use of insulin: Secondary | ICD-10-CM

## 2021-03-13 DIAGNOSIS — I110 Hypertensive heart disease with heart failure: Secondary | ICD-10-CM | POA: Diagnosis not present

## 2021-03-13 DIAGNOSIS — I5023 Acute on chronic systolic (congestive) heart failure: Secondary | ICD-10-CM | POA: Diagnosis not present

## 2021-03-13 MED ORDER — ENTRESTO 24-26 MG PO TABS: 1.0000 | ORAL_TABLET | Freq: Two times a day (BID) | ORAL | 0 refills | Status: DC

## 2021-03-13 MED ORDER — AMIODARONE HCL 200 MG PO TABS: 200.0000 mg | ORAL_TABLET | Freq: Every day | ORAL | 1 refills | Status: DC

## 2021-03-13 NOTE — Progress Notes (Signed)
Date:  03/13/2021   ID:  Derek Bond, DOB 1945/10/26, MRN [id number]  PCP:  Derek Chroman, MD  Cardiologist:  Derek Kras, DO, Derek Bond (established care 01/26/2021)  Date: 03/13/21 Last Office Visit: 02/11/2021  Chief Complaint  Patient presents with   Follow-up    Status post hospitalization, four-vessel bypass    HPI  Derek Bond is a 75 y.o. male who presents to the office with a chief complaint of " shortness of breath and heart failure management." Patient's past medical history and cardiovascular risk factors include: Ischemic cardiomyopathy, acute/chronic heart failure with reduced EF, hypertension, diabetes mellitus with microalbuminuria, erectile dysfunction, heartburn, former smoker, hyperlipidemia, sleep apnea not on CPAP, persistent atrial fibrillation status post Maze procedure, advanced age.  He is referred to the office at the request of Derek Bears B, MD for evaluation of shortness of breath.  Patient is accompanied by his daughter Derek Bond at today's office visit.  He verbally provides verbal consent with regards to discussing his medical information in her presence.  Initially the working diagnosis that his shortness of breath was most likely secondary to newly discovered atrial fibrillation.  However despite rate control and diuresis he continued to have shortness of breath.  The shared decision was to proceed with left heart catheterization electively.  However, due to multiple ER visits for chest discomfort patient was admitted on 02/22/2021 at [facility].  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  Underwent four-vessel bypass, RLE endovascular vein harvest, left atrial Maze procedure, and 45 mm atrial clip placement.  Discharged home in 03/04/2021.  Post hospitalization patient states that he is doing well.  But has noticed lower extremity swelling.  Patient's daughter states that at the time of discharge his weight was 210  pounds and at home he averages around 207 pounds.  This morning his weight was 209 pounds and she is concerned.  He denies orthopnea, paroxysmal nocturnal dyspnea.  His lower extremity swelling is more on the right at the site of the endovascular vein harvesting as opposed to the left (pitting edema).   Patient denies any chest pain at rest or with effort related activities.  He has an upcoming appointment with Dr. Kipp Brood in the next couple weeks.  FUNCTIONAL STATUS: No structured exercise program or daily routine.   ALLERGIES: No Known Allergies  MEDICATION LIST PRIOR TO VISIT: Current Meds  Medication Sig   acetaminophen (TYLENOL) 500 MG tablet Take 1,000 mg by mouth every 6 (six) hours as needed for moderate pain or mild pain.   apixaban (ELIQUIS) 5 MG TABS tablet Take 1 tablet (5 mg total) by mouth 2 (two) times daily.   aspirin EC 81 MG tablet Take 81 mg by mouth daily.   carvedilol (COREG) 3.125 MG tablet Take 1 tablet (3.125 mg total) by mouth 2 (two) times daily with a meal.   dapagliflozin propanediol (FARXIGA) 10 MG TABS tablet Take 1 tablet (10 mg total) by mouth daily before breakfast.   furosemide (LASIX) 40 MG tablet Take 1 tablet (40 mg total) by mouth daily.   glipiZIDE (GLUCOTROL XL) 10 MG 24 hr tablet Take 10 mg by mouth 2 (two) times daily.   Magnesium Oxide 400 MG CAPS Take 1 capsule (400 mg total) by mouth in the morning and at bedtime.   metFORMIN (GLUCOPHAGE) 1000 MG tablet Take 1,000 mg by mouth 2 (two) times daily with a meal.   pantoprazole (PROTONIX) 40 MG tablet Take  40 mg by mouth daily.   potassium chloride SA (KLOR-CON) 20 MEQ tablet Take 1 tablet (20 mEq total) by mouth daily.   rosuvastatin (CRESTOR) 5 MG tablet Take 5 mg by mouth at bedtime.   sacubitril-valsartan (ENTRESTO) 24-26 MG Take 1 tablet by mouth 2 (two) times daily.   traMADol (ULTRAM) 50 MG tablet Take 1 tablet (50 mg total) by mouth every 4 (four) hours as needed for moderate pain.    [DISCONTINUED] amiodarone (PACERONE) 200 MG tablet Take 2 tablets (400 mg total) by mouth 2 (two) times daily. Please take 2 tabs (400mg ) twice a day for 3 days then take 1 tab (200mg ) twice a day until we see you in follow-up.     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8    Hiatal hernia    History of melanoma    Obstructive sleep apnea    Type 2 diabetes mellitus ([place])     PAST SURGICAL HISTORY: Past Surgical History:  Procedure Laterality Date   BIOPSY  12/21/2019   Procedure: BIOPSY;  Surgeon: Harvel Quale, MD;  [facility];  Service: Gastroenterology;;  random colon   CLIPPING OF ATRIAL APPENDAGE N/A 02/26/2021   Procedure: CLIPPING OF ATRIAL APPENDAGE USING ATRICURE CLIP SIZE 21;  Surgeon: Lajuana Matte, MD;  Location: [place];  Service: Open Heart Surgery;  Laterality: N/A;   COLONOSCOPY WITH PROPOFOL N/A 12/21/2019   Procedure: COLONOSCOPY WITH PROPOFOL;  Surgeon: Harvel Quale, MD;  [facility];  Service: Gastroenterology;  Laterality: N/A;  915   CORONARY ARTERY BYPASS GRAFT N/A 02/26/2021   Procedure: CORONARY ARTERY BYPASS GRAFTING (CABG) X4, ON PUMP, USING LEFT INTERNAL MAMMARY ARTERY AND RIGHT ENDOSCOPICALLY HARVESTED GREATER SAPHENOUS VEIN;  Surgeon: Lajuana Matte, MD;  Location: [place];  Service: Open Heart Surgery;  Laterality: N/A;   ENDOVEIN HARVEST OF GREATER SAPHENOUS VEIN Right 02/26/2021   Procedure: ENDOVEIN HARVEST OF GREATER SAPHENOUS VEIN;  Surgeon: Lajuana Matte, MD;  Location: [place];  Service: Open Heart Surgery;  Laterality: Right;   MAZE N/A 02/26/2021   Procedure: MAZE;  Surgeon: Lajuana Matte, MD;  Location: [place];  Service:  Open Heart Surgery;  Laterality: N/A;   MELANOMA EXCISION     Left facial region   REPLACEMENT TOTAL KNEE Left    RIGHT/LEFT HEART CATH AND CORONARY ANGIOGRAPHY N/A 02/23/2021   Procedure: RIGHT/LEFT HEART CATH AND CORONARY ANGIOGRAPHY;  Surgeon: Nigel Mormon, MD;  Location: [place] [facility];  Service: Cardiovascular;  Laterality: N/A;   TEE WITHOUT CARDIOVERSION N/A 02/26/2021   Procedure: TRANSESOPHAGEAL ECHOCARDIOGRAM (TEE);  Surgeon: Lajuana Matte, MD;  Location: [place];  Service: Open Heart Surgery;  Laterality: N/A;   UMBILICAL HERNIA REPAIR      FAMILY HISTORY: The patient family history includes Diabetes in his brother, mother, and another family member; Hypertension in his brother, brother, sister, sister, and another family member; Lung cancer in his father; Rheum arthritis in an other family member; Stroke in an other family member.  SOCIAL HISTORY:  The patient  reports that he quit smoking about 31 years ago. His smoking use included cigarettes. He has a  60.00 pack-year smoking history. He has never used smokeless tobacco. He reports that he does not currently use alcohol. He reports that he does not use drugs.  REVIEW OF SYSTEMS: Review of Systems  Constitutional: Negative for chills, fever and malaise/fatigue.  HENT:  Negative for hoarse voice and nosebleeds.   Eyes:  Negative for discharge, double vision and pain.  Cardiovascular:  Positive for leg swelling. Negative for chest pain, claudication, dyspnea on exertion, near-syncope, orthopnea, palpitations, paroxysmal nocturnal dyspnea and syncope.  Respiratory:  Negative for hemoptysis and shortness of breath.   Endocrine: Positive for heat intolerance.  Musculoskeletal:  Negative for muscle cramps and myalgias.  Gastrointestinal:  Positive for heartburn. Negative for abdominal pain, constipation, diarrhea, hematemesis, hematochezia, melena, nausea and vomiting.  Neurological:  Negative for dizziness and  light-headedness.   PHYSICAL EXAM: Vitals with BMI 03/13/2021 03/04/2021 03/04/2021  Height 6\' 6"  - -  Weight 209 lbs - 233 lbs 5 oz  BMI 27.03 - 50.09  Systolic 381 829 -  Diastolic 77 72 -  Pulse 84 - -    CONSTITUTIONAL: Well-developed and well-nourished. No acute distress.  Presents in a wheelchair. SKIN: Skin is warm and dry. No rash noted. No cyanosis. No pallor. No jaundice HEAD: Normocephalic and atraumatic.  EYES: No scleral icterus MOUTH/THROAT: Moist oral membranes.  NECK: No JVD present. No thyromegaly noted. No carotid bruits  LYMPHATIC: No visible cervical adenopathy.  CHEST Normal respiratory effort. No intercostal retractions.  Sternotomy site is healing well. LUNGS: Clear to auscultation bilaterally.  No stridor. No wheezes. No rales.  CARDIOVASCULAR: Irregularly irregular, variable S1-S2, no murmurs rubs or gallops appreciated secondary to tachycardia. ABDOMINAL: Soft, nontender, nondistended, positive bowel sounds in all 4 quadrants, no apparent ascites.  EXTREMITIES: Bilateral pitting edema 2+ on the right, 1+ on the left, warm to touch bilaterally. HEMATOLOGIC: No significant bruising NEUROLOGIC: Oriented to person, place, and time. Nonfocal. Normal muscle tone.  PSYCHIATRIC: Normal mood and affect. Normal behavior. Cooperative  CARDIAC DATABASE: 02/26/2021: CABG x4 (LIMA to LAD, SVG to DIAGONAL, SVG to OM, SVG to PLB) + MAZE + 45 mm atrial clip placement.  EKG: 01/26/2021: Atrial fibrillation with rapid ventricular rate 126 bpm, left axis deviation, left anterior fascicular block, left bundle branch block, consider old inferior infarct.  02/11/2021: Atrial fibrillation, 105 bpm, left axis, left bundle branch block.   Echocardiogram: 02/05/2021:  Severely depressed LV systolic function with visual EF 25-30%. Hypokinetic global wall motion with regionality involving septal wall. Left ventricle cavity is mildly dilated. Moderate left ventricular hypertrophy.  Unable to evaluate diastolic function due to atrial fibrillation. Elevated LAP.  Moderate (Grade II) mitral regurgitation.  Moderate tricuspid regurgitation. Mild pulmonary hypertension, RVSP 70mmHg.  IVC is dilated with a respiratory response of <50%.  No prior study for comparison.  Stress Testing: MPI 02/12/2016: No diagnostic ST segment changes to indicate ischemia. No significant arrhythmias. Small, mild intensity, partially reversible mid to basal inferior defect suggestive of variable soft tissue attenuation. No large ischemic zones are noted. This is a low risk study. Nuclear stress EF: 55%.  Heart Catheterization: Left and right heart catheterization 02/23/2021 LM: Normal LAD: Prox 80% stenosius at bifurcation with small to medium caliber D1 with 75% long prox disease. Mid 70% disease Ramus: Minimal luminal irregularities Lcx: OM1 with prox 80% stenosis RCA: 60-70% disease in PL branches   RA: 2 mmHg RV: 22/0 mmHg PA: 21/5 mmHg, mPAP 13 mmHg PCW: 8 mmHg   CO: 5.6 L/min CI: 2.5 L/min/m2  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%, diabetic patient CVTS consult for CABG  LABORATORY DATA: CBC Latest Ref Rng & Units 03/02/2021 03/01/2021 02/28/2021  WBC 4.0 - 10.5 K/uL 6.7 7.1 8.8  Hemoglobin 13.0 - 17.0 g/dL 12.9(L) 11.5(L) 11.2(L)  Hematocrit 39.0 - 52.0 % 39.0 34.7(L) 33.6(L)  Platelets 150 - 400 K/uL 171 166 149(L)    CMP Latest Ref Rng & Units 03/02/2021 03/01/2021 02/28/2021  Glucose 70 - 99 mg/dL 174(H) 148(H) 160(H)  BUN 8 - 23 mg/dL 22 22 12   Creatinine 0.61 - 1.24 mg/dL 0.76 0.75 0.68  Sodium 135 - 145 mmol/L 131(L) 133(L) 133(L)  Potassium 3.5 - 5.1 mmol/L 4.4 4.6 4.1  Chloride 98 - 111 mmol/L 102 102 106  CO2 22 - 32 mmol/L 23 26 23   Calcium 8.9 - 10.3 mg/dL 8.8(L) 8.9 8.2(L)  Total Protein 6.5 - 8.1 g/dL - - -  Total Bilirubin 0.3 - 1.2 mg/dL - - -  Alkaline Phos 38 - 126 U/L - - -  AST 15 - 41 U/L - - -  ALT 0 - 44 U/L - - -    Lipid  Panel     Component Value Date/Time   CHOL 99 02/22/2021 0131   TRIG 56 02/22/2021 0131   HDL 44 02/22/2021 0131   CHOLHDL 2.3 02/22/2021 0131   VLDL 11 02/22/2021 0131   LDLCALC 44 02/22/2021 0131    No components found for: NTPROBNP Recent Labs    02/05/21 1441 02/18/21 1534  PROBNP 1,567* 2,355*   Recent Labs    02/21/21 1347 02/22/21 0131  TSH 1.615 2.348    BMP Recent Labs    02/28/21 0208 03/01/21 0425 03/02/21 0126  NA 133* 133* 131*  K 4.1 4.6 4.4  CL 106 102 102  CO2 23 26 23   GLUCOSE 160* 148* 174*  BUN 12 22 22   CREATININE 0.68 0.75 0.76  CALCIUM 8.2* 8.9 8.8*  GFRNONAA >60 >60 >60    HEMOGLOBIN A1C Lab Results  Component Value Date   HGBA1C 8.1 (H) 02/22/2021   MPG 185.77 02/22/2021    IMPRESSION:    ICD-10-CM   1. Acute HFrEF (heart failure with reduced ejection fraction) (HCC)  I50.21 EKG 43-OOIL    Basic metabolic panel    Magnesium    TSH    sacubitril-valsartan (ENTRESTO) 24-26 MG    2. Atherosclerosis of native coronary artery of native heart without angina pectoris  I25.10     3. S/P CABG x 4  Z95.1     4. Ischemic cardiomyopathy  I25.5     5. Persistent atrial fibrillation (HCC)  I48.19 amiodarone (PACERONE) 200 MG tablet    6. S/P Maze for atrial fibrillation, 49mm LAA clip.   Z98.890    Z86.79     7. Long term (current) use of anticoagulants  Z79.01     8. Type 2 diabetes mellitus with microalbuminuria, with long-term current use of insulin (HCC)  E11.29    R80.9    Z79.4     9. Mixed hyperlipidemia  E78.2     10. Benign hypertension  I10     11. Sleep apnea, unspecified type  G47.30     12. Former smoker  Z87.891        RECOMMENDATIONS: Derek Bond is a 75 y.o. male whose past medical history and cardiac risk factors include: Ischemic cardiomyopathy, acute/chronic heart failure with reduced EF, hypertension, diabetes mellitus with microalbuminuria, erectile dysfunction, heartburn, former smoker,  hyperlipidemia, sleep  apnea not on CPAP, persistent atrial fibrillation status post Maze procedure, advanced age.  Acute HFrEF (heart failure with reduced ejection fraction)/ischemic cardiomyopathy (HCC) Stage C, NYHA class II/III Medications reconciled. Echo 02/25/2021: LVEF 30-35% Recommend daily weight check, strict I/O's Fluid restriction to <2L per day, Na restriction < 1.5g per day Start Entresto 24/26 mg p.o. twice daily Blood work in 1 week to evaluate kidney function and electrolytes. After the patient is on maximally tolerated GDMT recommend echocardiogram in 90 days to evaluate LVEF  Atherosclerosis of native coronary artery of native heart, four-vessel bypass, without angina pectoris Denies angina pectoris. No use of sublingual nitroglycerin tablets. Once cleared by cardiothoracic surgery patient will be enrolled into cardiac rehab.  S/P CABG x 4 See above. Educated on the importance of improving his modifiable cardiovascular risk factors.  Persistent atrial fibrillation (HCC) Rate control: Carvedilol. Rhythm control: Amiodarone. Thromboembolic prophylaxis: Eliquis CHA2DS2-VASc SCORE is 5 which correlates to 6.7% risk of stroke per year. Patient's daughter states that he has been having heat intolerance.  TSH prior to his CABG was within normal limits we will recheck his TSH level as the patient is on amiodarone. Once patient is euvolemic and recovering well consider cardioversion to restore normal sinus rhythm. Will reduce amiodarone to 200 mg p.o. daily  Long term (current) use of anticoagulant & S/P Maze for atrial fibrillation, 44mm LAA clip.  Indication: Persistent atrial fibrillation. Patient did restore normal sinus rhythm shortly after his bypass surgery went back into atrial fibrillation. Reemphasized the risks, benefits, and alternatives to oral anticoagulation. Patient does not endorse any evidence of bleeding.  Type 2 diabetes mellitus with  microalbuminuria, with long-term current use of insulin (HCC) Currently on insulin therapy. Continue [place], statin therapy Educated on the importance of glycemic control.  Mixed hyperlipidemia Currently on Crestor.   He denies myalgia or other side effects. Most recent lipids dated 02/2021 reviewed as noted above.  LDL currently at goal  Benign hypertension Office blood pressures within acceptable range. Reemphasized importance of low-salt diet. Medications reconciled.  FINAL MEDICATION LIST END OF ENCOUNTER: Meds ordered this encounter  Medications   amiodarone (PACERONE) 200 MG tablet    Sig: Take 1 tablet (200 mg total) by mouth daily.    Dispense:  80 tablet    Refill:  1   sacubitril-valsartan (ENTRESTO) 24-26 MG    Sig: Take 1 tablet by mouth 2 (two) times daily.    Dispense:  60 tablet    Refill:  0     Medications Discontinued During This Encounter  Medication Reason   amiodarone (PACERONE) 200 MG tablet       Current Outpatient Medications:    acetaminophen (TYLENOL) 500 MG tablet, Take 1,000 mg by mouth every 6 (six) hours as needed for moderate pain or mild pain., Disp: , Rfl:    apixaban (ELIQUIS) 5 MG TABS tablet, Take 1 tablet (5 mg total) by mouth 2 (two) times daily., Disp: 180 tablet, Rfl: 0   aspirin EC 81 MG tablet, Take 81 mg by mouth daily., Disp: , Rfl:    carvedilol (COREG) 3.125 MG tablet, Take 1 tablet (3.125 mg total) by mouth 2 (two) times daily with a meal., Disp: 30 tablet, Rfl: 1   dapagliflozin propanediol (FARXIGA) 10 MG TABS tablet, Take 1 tablet (10 mg total) by mouth daily before breakfast., Disp: 90 tablet, Rfl: 0   furosemide (LASIX) 40 MG tablet, Take 1 tablet (40 mg total) by mouth daily., Disp: 30 tablet, Rfl:  0   glipiZIDE (GLUCOTROL XL) 10 MG 24 hr tablet, Take 10 mg by mouth 2 (two) times daily., Disp: , Rfl:    Magnesium Oxide 400 MG CAPS, Take 1 capsule (400 mg total) by mouth in the morning and at bedtime., Disp: 180  capsule, Rfl: 0   metFORMIN (GLUCOPHAGE) 1000 MG tablet, Take 1,000 mg by mouth 2 (two) times daily with a meal., Disp: , Rfl:    pantoprazole (PROTONIX) 40 MG tablet, Take 40 mg by mouth daily., Disp: , Rfl:    potassium chloride SA (KLOR-CON) 20 MEQ tablet, Take 1 tablet (20 mEq total) by mouth daily., Disp: 30 tablet, Rfl: 0   rosuvastatin (CRESTOR) 5 MG tablet, Take 5 mg by mouth at bedtime., Disp: , Rfl:    sacubitril-valsartan (ENTRESTO) 24-26 MG, Take 1 tablet by mouth 2 (two) times daily., Disp: 60 tablet, Rfl: 0   traMADol (ULTRAM) 50 MG tablet, Take 1 tablet (50 mg total) by mouth every 4 (four) hours as needed for moderate pain., Disp: 30 tablet, Rfl: 0   amiodarone (PACERONE) 200 MG tablet, Take 1 tablet (200 mg total) by mouth daily., Disp: 80 tablet, Rfl: 1   insulin degludec (TRESIBA FLEXTOUCH) 100 UNIT/ML FlexTouch Pen, Inject 25 Units into the skin daily. (Patient not taking: Reported on 03/13/2021), Disp: 100 mL, Rfl: 0  Orders Placed This Encounter  Procedures   Basic metabolic panel   Magnesium   TSH   EKG 12-Lead    There are no Patient Instructions on file for this visit.   --Continue cardiac medications as reconciled in final medication list. --Return in about 2 weeks (around 03/27/2021) for Follow up, heart failure management.. Or sooner if needed. --Continue follow-up with your primary care physician regarding the management of your other chronic comorbid conditions.  Patient's questions and concerns were addressed to his satisfaction. He voices understanding of the instructions provided during this encounter.   This note was created using a voice recognition software as a result there may be grammatical errors inadvertently enclosed that do not reflect the nature of this encounter. Every attempt is made to correct such errors.  Derek Bond, [place], [facility]  Pager: [phone] Office: [phone]

## 2021-03-16 DIAGNOSIS — I10 Essential (primary) hypertension: Secondary | ICD-10-CM | POA: Diagnosis not present

## 2021-03-16 DIAGNOSIS — E1165 Type 2 diabetes mellitus with hyperglycemia: Secondary | ICD-10-CM | POA: Diagnosis not present

## 2021-03-17 DIAGNOSIS — I4819 Other persistent atrial fibrillation: Secondary | ICD-10-CM | POA: Diagnosis not present

## 2021-03-17 DIAGNOSIS — I447 Left bundle-branch block, unspecified: Secondary | ICD-10-CM | POA: Diagnosis not present

## 2021-03-17 DIAGNOSIS — L03115 Cellulitis of right lower limb: Secondary | ICD-10-CM | POA: Diagnosis not present

## 2021-03-17 DIAGNOSIS — I25110 Atherosclerotic heart disease of native coronary artery with unstable angina pectoris: Secondary | ICD-10-CM | POA: Diagnosis not present

## 2021-03-17 DIAGNOSIS — I5023 Acute on chronic systolic (congestive) heart failure: Secondary | ICD-10-CM | POA: Diagnosis not present

## 2021-03-17 DIAGNOSIS — Z48812 Encounter for surgical aftercare following surgery on the circulatory system: Secondary | ICD-10-CM | POA: Diagnosis not present

## 2021-03-17 DIAGNOSIS — E785 Hyperlipidemia, unspecified: Secondary | ICD-10-CM | POA: Diagnosis not present

## 2021-03-17 DIAGNOSIS — I110 Hypertensive heart disease with heart failure: Secondary | ICD-10-CM | POA: Diagnosis not present

## 2021-03-17 DIAGNOSIS — I429 Cardiomyopathy, unspecified: Secondary | ICD-10-CM | POA: Diagnosis not present

## 2021-03-18 DIAGNOSIS — E1165 Type 2 diabetes mellitus with hyperglycemia: Secondary | ICD-10-CM | POA: Diagnosis not present

## 2021-03-18 DIAGNOSIS — I10 Essential (primary) hypertension: Secondary | ICD-10-CM | POA: Diagnosis not present

## 2021-03-18 DIAGNOSIS — Z299 Encounter for prophylactic measures, unspecified: Secondary | ICD-10-CM | POA: Diagnosis not present

## 2021-03-18 DIAGNOSIS — K219 Gastro-esophageal reflux disease without esophagitis: Secondary | ICD-10-CM | POA: Diagnosis not present

## 2021-03-18 DIAGNOSIS — G4700 Insomnia, unspecified: Secondary | ICD-10-CM | POA: Diagnosis not present

## 2021-03-19 ENCOUNTER — Telehealth: Payer: Self-pay

## 2021-03-19 DIAGNOSIS — I25110 Atherosclerotic heart disease of native coronary artery with unstable angina pectoris: Secondary | ICD-10-CM | POA: Diagnosis not present

## 2021-03-19 DIAGNOSIS — I429 Cardiomyopathy, unspecified: Secondary | ICD-10-CM | POA: Diagnosis not present

## 2021-03-19 DIAGNOSIS — L03115 Cellulitis of right lower limb: Secondary | ICD-10-CM | POA: Diagnosis not present

## 2021-03-19 DIAGNOSIS — I110 Hypertensive heart disease with heart failure: Secondary | ICD-10-CM | POA: Diagnosis not present

## 2021-03-19 DIAGNOSIS — Z48812 Encounter for surgical aftercare following surgery on the circulatory system: Secondary | ICD-10-CM | POA: Diagnosis not present

## 2021-03-19 DIAGNOSIS — I4819 Other persistent atrial fibrillation: Secondary | ICD-10-CM | POA: Diagnosis not present

## 2021-03-19 DIAGNOSIS — I447 Left bundle-branch block, unspecified: Secondary | ICD-10-CM | POA: Diagnosis not present

## 2021-03-19 DIAGNOSIS — I5023 Acute on chronic systolic (congestive) heart failure: Secondary | ICD-10-CM | POA: Diagnosis not present

## 2021-03-19 DIAGNOSIS — E785 Hyperlipidemia, unspecified: Secondary | ICD-10-CM | POA: Diagnosis not present

## 2021-03-19 NOTE — Telephone Encounter (Signed)
Patient's daughter, Derek Bond contacted the office concerned about her dads right leg. He is s/p CABG with Dr. Kipp Brood 02/26/21. She states that her dad has had swelling bilaterally which he is taking extra diuretics to help resolve it. HE also went to his PCP follow-up and she states that her father was given Amoxicillin 500 mg for possible right lower leg EVH site infection. She also stated that he has had some redness/ tenderness to that right lower leg from knee to ankle. She states that it is not hot to touch, he is afebrile, and does not display a rash. It is not hurting more than when he came home from the hospital. Advised family to send pictures of wound to the office phone for further evaluation.  Spoke with Lars Pinks, PA who stated that it is cellulitis and advised for patient to continue to take the antibiotic but to give the office a call if any changes occur. Also scheduled appointment for Monday, 11/7 for follow-up and evaluation of right lower leg. Derek Bond, daughter aware of appointment and when to contact the office, she acknowledged receipt.

## 2021-03-20 DIAGNOSIS — I429 Cardiomyopathy, unspecified: Secondary | ICD-10-CM | POA: Diagnosis not present

## 2021-03-20 DIAGNOSIS — I4819 Other persistent atrial fibrillation: Secondary | ICD-10-CM | POA: Diagnosis not present

## 2021-03-20 DIAGNOSIS — E785 Hyperlipidemia, unspecified: Secondary | ICD-10-CM | POA: Diagnosis not present

## 2021-03-20 DIAGNOSIS — I25110 Atherosclerotic heart disease of native coronary artery with unstable angina pectoris: Secondary | ICD-10-CM | POA: Diagnosis not present

## 2021-03-20 DIAGNOSIS — L03115 Cellulitis of right lower limb: Secondary | ICD-10-CM | POA: Diagnosis not present

## 2021-03-20 DIAGNOSIS — I5023 Acute on chronic systolic (congestive) heart failure: Secondary | ICD-10-CM | POA: Diagnosis not present

## 2021-03-20 DIAGNOSIS — Z48812 Encounter for surgical aftercare following surgery on the circulatory system: Secondary | ICD-10-CM | POA: Diagnosis not present

## 2021-03-20 DIAGNOSIS — I110 Hypertensive heart disease with heart failure: Secondary | ICD-10-CM | POA: Diagnosis not present

## 2021-03-20 DIAGNOSIS — I447 Left bundle-branch block, unspecified: Secondary | ICD-10-CM | POA: Diagnosis not present

## 2021-03-23 ENCOUNTER — Other Ambulatory Visit: Payer: Self-pay

## 2021-03-23 ENCOUNTER — Telehealth: Payer: Self-pay | Admitting: Cardiology

## 2021-03-23 ENCOUNTER — Ambulatory Visit (INDEPENDENT_AMBULATORY_CARE_PROVIDER_SITE_OTHER): Payer: Self-pay | Admitting: Surgical

## 2021-03-23 DIAGNOSIS — Z5189 Encounter for other specified aftercare: Secondary | ICD-10-CM

## 2021-03-23 DIAGNOSIS — I5021 Acute systolic (congestive) heart failure: Secondary | ICD-10-CM | POA: Diagnosis not present

## 2021-03-23 MED ORDER — CEPHALEXIN 500 MG PO CAPS: 500.0000 mg | ORAL_CAPSULE | Freq: Three times a day (TID) | ORAL | 0 refills | Status: DC

## 2021-03-23 NOTE — Telephone Encounter (Signed)
They are planning on going today because he has an appt in [place] today and they will get his blood work done afterwards.

## 2021-03-23 NOTE — Patient Instructions (Signed)
Antibiotic changed from oral amoxicillin to Keflex.

## 2021-03-23 NOTE — Telephone Encounter (Signed)
He was suppose to have labs after being on entresto for one week. Has he had them yet?

## 2021-03-23 NOTE — Telephone Encounter (Signed)
Okay. Based on the labs will determine the dose of entresto (same or go up).  Ask him how his BP have been at home as well.

## 2021-03-23 NOTE — Progress Notes (Signed)
[address]       [place] [address]             [phone]      Kedric R Becvar [place] Medical Record #[id number] Date of Birth: Dec 29, 1945  Referring: Rex Kras, DO Primary Care: Glenda Chroman, MD Primary Cardiologist: None   Chief Complaint:   POST OP FOLLOW UP 02/26/2021 Patient:  Molli Knock Crosley Pre-Op Dx: Coronary artery disease                         Congestive heart failure                         Chronic atrial fibrillation                         Diabetes mellitus                          Post-op Dx: Same Procedure: CABG X 4, LIMA to LAD, reverse saphenous vein graft to posterior lateral branch, reverse saphenous vein graft to obtuse marginal #1, reverse saphenous vein graft to first diagonal. Left atrial Maze procedure with a box lesion set using the AtriCure clamp. 45 mm atrial clip placement Endoscopic greater saphenous vein harvest on the right    History of Present Illness:    The patient is a 75 year old male status post the above described procedure seen in the office on today's date due to right lower extremity EVH site redness.  The patient's family reports that the lower extremity redness has been present for over a week and he was started on amoxicillin last Wednesday.  They stated that his lower extremities were edematous but this has significantly improved over time.  There is no drainage from the incision.  He has not had any fevers, chills or other significant constitutional symptoms.  It is notable that he is not very active in regards to his physical recovery.      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    Due to laceration at age 22    Hiatal hernia    History of melanoma    Obstructive sleep apnea     Type 2 diabetes mellitus ([place])      Social History   Tobacco Use  Smoking Status Former   Packs/day: 2.00   Years: 30.00   Pack years: 60.00   Types: Cigarettes   Quit date: 05/17/1989   Years since quitting: 31.8  Smokeless Tobacco Never    Social History   Substance and Sexual Activity  Alcohol Use Not Currently     No Known Allergies  Current Outpatient Medications  Medication Sig Dispense Refill   acetaminophen (TYLENOL) 500 MG tablet Take 1,000 mg by mouth every 6 (six) hours as needed for moderate pain or mild pain.     amiodarone (PACERONE) 200 MG tablet Take 1 tablet (200 mg total) by mouth daily. 80 tablet 1   apixaban (ELIQUIS) 5 MG TABS tablet Take 1 tablet (5 mg total) by mouth 2 (two) times daily. 180 tablet 0   aspirin EC  81 MG tablet Take 81 mg by mouth daily.     carvedilol (COREG) 3.125 MG tablet Take 1 tablet (3.125 mg total) by mouth 2 (two) times daily with a meal. 30 tablet 1   dapagliflozin propanediol (FARXIGA) 10 MG TABS tablet Take 1 tablet (10 mg total) by mouth daily before breakfast. 90 tablet 0   furosemide (LASIX) 40 MG tablet Take 1 tablet (40 mg total) by mouth daily. 30 tablet 0   glipiZIDE (GLUCOTROL XL) 10 MG 24 hr tablet Take 10 mg by mouth 2 (two) times daily.     insulin degludec (TRESIBA FLEXTOUCH) 100 UNIT/ML FlexTouch Pen Inject 25 Units into the skin daily. 100 mL 0   Magnesium Oxide 400 MG CAPS Take 1 capsule (400 mg total) by mouth in the morning and at bedtime. 180 capsule 0   metFORMIN (GLUCOPHAGE) 1000 MG tablet Take 1,000 mg by mouth 2 (two) times daily with a meal.     pantoprazole (PROTONIX) 40 MG tablet Take 40 mg by mouth daily.     potassium chloride SA (KLOR-CON) 20 MEQ tablet Take 1 tablet (20 mEq total) by mouth daily. 30 tablet 0   rosuvastatin (CRESTOR) 5 MG tablet Take 5 mg by mouth at bedtime.     sacubitril-valsartan (ENTRESTO) 24-26 MG Take 1 tablet by mouth 2 (two) times daily. 60 tablet 0   traMADol (ULTRAM) 50 MG  tablet Take 1 tablet (50 mg total) by mouth every 4 (four) hours as needed for moderate pain. 30 tablet 0   No current facility-administered medications for this visit.       Physical Exam: Ht 6\' 6"  (1.981 m)   BMI 24.15 kg/m   General appearance: alert, cooperative, distracted, and fatigued Heart: regular rate and rhythm Lungs: Mildly diminished in the left base Extremities: The right lower extremity has edema from the level of the knee to the pretibial region just above the foot.  Foot is warm to touch but no palpable DP/PT pulses.  Motor/sensory are grossly intact.   Diagnostic Studies & Laboratory data:     Recent Radiology Findings:   No results found.    Recent Lab Findings: Lab Results  Component Value Date   WBC 6.7 03/02/2021   HGB 12.9 (L) 03/02/2021   HCT 39.0 03/02/2021   PLT 171 03/02/2021   GLUCOSE 174 (H) 03/02/2021   CHOL 99 02/22/2021   TRIG 56 02/22/2021   HDL 44 02/22/2021   LDLCALC 44 02/22/2021   ALT 47 (H) 02/26/2021   AST 70 (H) 02/26/2021   NA 131 (L) 03/02/2021   K 4.4 03/02/2021   CL 102 03/02/2021   CREATININE 0.76 03/02/2021   BUN 22 03/02/2021   CO2 23 03/02/2021   TSH 2.348 02/22/2021   INR 1.5 (H) 02/26/2021   HGBA1C 8.1 (H) 02/22/2021      Assessment / Plan: Cellulitis right lower extremity.  He has been on amoxicillin for 6 days with some improvement.  We will to change him to Keflex 500 p.o. 3 times daily for 10 days.  His preoperative ABIs on the right were in the normal range.  His diabetes has been under fair control with both high and low readings.  He is treated for this by Dr. Woody Seller and family is in close contact with his office for monitoring and medication adjustment.  He is scheduled to see Dr. Kipp Brood next Wednesday and we will keep that appointment.  The family is very attentive and will let us  know if there are any significant changes in the meantime.  He could potentially require further vascular evaluation.  It does  not appear that the tunnel from the [facility] harvest has obvious thrombosis but that is a possibility as it does feel somewhat thickened and indurated.  Hopefully he will not require any type of incision and drainage.  No orders of the defined types were placed in this encounter.    John Giovanni, PA-C  03/23/2021 1:54 PM

## 2021-03-23 NOTE — Telephone Encounter (Signed)
Patient's daughter Hoyle Sauer says Dr. Terri Skains gave patient samples of Entresto at most recent appointment to hold him over since his next appointment is not until 11/15, and he wants to see how patient does with the meds first. Patient will be out of samples for a few days prior to appointment, can he receive more samples of Entresto? Contact number for Hoyle Sauer is [phone].

## 2021-03-24 ENCOUNTER — Other Ambulatory Visit: Payer: Self-pay

## 2021-03-24 DIAGNOSIS — I5023 Acute on chronic systolic (congestive) heart failure: Secondary | ICD-10-CM | POA: Diagnosis not present

## 2021-03-24 DIAGNOSIS — I5021 Acute systolic (congestive) heart failure: Secondary | ICD-10-CM

## 2021-03-24 DIAGNOSIS — L03115 Cellulitis of right lower limb: Secondary | ICD-10-CM | POA: Diagnosis not present

## 2021-03-24 DIAGNOSIS — I447 Left bundle-branch block, unspecified: Secondary | ICD-10-CM | POA: Diagnosis not present

## 2021-03-24 DIAGNOSIS — I429 Cardiomyopathy, unspecified: Secondary | ICD-10-CM | POA: Diagnosis not present

## 2021-03-24 DIAGNOSIS — E785 Hyperlipidemia, unspecified: Secondary | ICD-10-CM | POA: Diagnosis not present

## 2021-03-24 DIAGNOSIS — I110 Hypertensive heart disease with heart failure: Secondary | ICD-10-CM | POA: Diagnosis not present

## 2021-03-24 DIAGNOSIS — Z48812 Encounter for surgical aftercare following surgery on the circulatory system: Secondary | ICD-10-CM | POA: Diagnosis not present

## 2021-03-24 DIAGNOSIS — I25110 Atherosclerotic heart disease of native coronary artery with unstable angina pectoris: Secondary | ICD-10-CM | POA: Diagnosis not present

## 2021-03-24 DIAGNOSIS — I4819 Other persistent atrial fibrillation: Secondary | ICD-10-CM | POA: Diagnosis not present

## 2021-03-24 LAB — BASIC METABOLIC PANEL
BUN/Creatinine Ratio: 19 (ref 10–24)
BUN: 21 mg/dL (ref 8–27)
CO2: 23 mmol/L (ref 20–29)
Calcium: 10.2 mg/dL (ref 8.6–10.2)
Chloride: 96 mmol/L (ref 96–106)
Creatinine, Ser: 1.09 mg/dL (ref 0.76–1.27)
Glucose: 204 mg/dL — ABNORMAL HIGH (ref 70–99)
Potassium: 5.6 mmol/L — ABNORMAL HIGH (ref 3.5–5.2)
Sodium: 135 mmol/L (ref 134–144)
eGFR: 71 mL/min/{1.73_m2} (ref >59)

## 2021-03-24 LAB — CBC
Hematocrit: 44.3 % (ref 37.5–51.0)
Hemoglobin: 14.4 g/dL (ref 13.0–17.7)
MCH: 28.6 pg (ref 26.6–33.0)
MCHC: 32.5 g/dL (ref 31.5–35.7)
MCV: 88 fL (ref 79–97)
Platelets: 407 10*3/uL (ref 150–450)
RBC: 5.04 x10E6/uL (ref 4.14–5.80)
RDW: 14.5 % (ref 11.6–15.4)
WBC: 7.6 10*3/uL (ref 3.4–10.8)

## 2021-03-24 LAB — PRO B NATRIURETIC PEPTIDE: NT-Pro BNP: 1938 pg/mL — ABNORMAL HIGH (ref 0–486)

## 2021-03-24 LAB — MAGNESIUM: Magnesium: 1.9 mg/dL (ref 1.6–2.3)

## 2021-03-24 NOTE — Telephone Encounter (Signed)
This has been addressed and patient is aware

## 2021-03-24 NOTE — Progress Notes (Signed)
I spoke to patient he voiced understanding

## 2021-03-27 DIAGNOSIS — Z48812 Encounter for surgical aftercare following surgery on the circulatory system: Secondary | ICD-10-CM | POA: Diagnosis not present

## 2021-03-27 DIAGNOSIS — I4819 Other persistent atrial fibrillation: Secondary | ICD-10-CM | POA: Diagnosis not present

## 2021-03-27 DIAGNOSIS — I447 Left bundle-branch block, unspecified: Secondary | ICD-10-CM | POA: Diagnosis not present

## 2021-03-27 DIAGNOSIS — I25110 Atherosclerotic heart disease of native coronary artery with unstable angina pectoris: Secondary | ICD-10-CM | POA: Diagnosis not present

## 2021-03-27 DIAGNOSIS — I110 Hypertensive heart disease with heart failure: Secondary | ICD-10-CM | POA: Diagnosis not present

## 2021-03-27 DIAGNOSIS — L03115 Cellulitis of right lower limb: Secondary | ICD-10-CM | POA: Diagnosis not present

## 2021-03-27 DIAGNOSIS — E785 Hyperlipidemia, unspecified: Secondary | ICD-10-CM | POA: Diagnosis not present

## 2021-03-27 DIAGNOSIS — I5023 Acute on chronic systolic (congestive) heart failure: Secondary | ICD-10-CM | POA: Diagnosis not present

## 2021-03-27 DIAGNOSIS — I429 Cardiomyopathy, unspecified: Secondary | ICD-10-CM | POA: Diagnosis not present

## 2021-03-30 DIAGNOSIS — I447 Left bundle-branch block, unspecified: Secondary | ICD-10-CM | POA: Diagnosis not present

## 2021-03-30 DIAGNOSIS — Z48812 Encounter for surgical aftercare following surgery on the circulatory system: Secondary | ICD-10-CM | POA: Diagnosis not present

## 2021-03-30 DIAGNOSIS — I25110 Atherosclerotic heart disease of native coronary artery with unstable angina pectoris: Secondary | ICD-10-CM | POA: Diagnosis not present

## 2021-03-30 DIAGNOSIS — I110 Hypertensive heart disease with heart failure: Secondary | ICD-10-CM | POA: Diagnosis not present

## 2021-03-30 DIAGNOSIS — E785 Hyperlipidemia, unspecified: Secondary | ICD-10-CM | POA: Diagnosis not present

## 2021-03-30 DIAGNOSIS — I5021 Acute systolic (congestive) heart failure: Secondary | ICD-10-CM | POA: Diagnosis not present

## 2021-03-30 DIAGNOSIS — L03115 Cellulitis of right lower limb: Secondary | ICD-10-CM | POA: Diagnosis not present

## 2021-03-30 DIAGNOSIS — I4819 Other persistent atrial fibrillation: Secondary | ICD-10-CM | POA: Diagnosis not present

## 2021-03-30 DIAGNOSIS — I429 Cardiomyopathy, unspecified: Secondary | ICD-10-CM | POA: Diagnosis not present

## 2021-03-30 DIAGNOSIS — I5023 Acute on chronic systolic (congestive) heart failure: Secondary | ICD-10-CM | POA: Diagnosis not present

## 2021-03-31 ENCOUNTER — Ambulatory Visit (INDEPENDENT_AMBULATORY_CARE_PROVIDER_SITE_OTHER): Payer: Self-pay | Admitting: Physician Assistant

## 2021-03-31 ENCOUNTER — Other Ambulatory Visit: Payer: Self-pay | Admitting: Thoracic Surgery (Cardiothoracic Vascular Surgery)

## 2021-03-31 ENCOUNTER — Encounter: Payer: Self-pay | Admitting: Physician Assistant

## 2021-03-31 ENCOUNTER — Telehealth: Payer: Self-pay | Admitting: *Deleted

## 2021-03-31 ENCOUNTER — Ambulatory Visit
Admission: RE | Admit: 2021-03-31 | Discharge: 2021-03-31 | Disposition: A | Discharge status: Home / Self Care | Payer: Medicare PPO | Source: Ambulatory Visit | Attending: Thoracic Surgery (Cardiothoracic Vascular Surgery) | Admitting: Thoracic Surgery (Cardiothoracic Vascular Surgery)

## 2021-03-31 ENCOUNTER — Other Ambulatory Visit: Payer: Self-pay

## 2021-03-31 ENCOUNTER — Ambulatory Visit: Payer: Medicare PPO | Admitting: Cardiology

## 2021-03-31 ENCOUNTER — Inpatient Hospital Stay (HOSPITAL_COMMUNITY)
Admission: AD | Admit: 2021-03-31 | Discharge: 2021-04-02 | DRG: 603 | Disposition: A | Discharge status: Home / Self Care | Payer: Medicare PPO | Source: Ambulatory Visit | Attending: Thoracic Surgery (Cardiothoracic Vascular Surgery) | Admitting: Thoracic Surgery (Cardiothoracic Vascular Surgery)

## 2021-03-31 ENCOUNTER — Encounter: Payer: Self-pay | Admitting: Cardiology

## 2021-03-31 VITALS — BP 107/71 | HR 100 | Resp 20 | Ht 78.0 in | Wt 193.0 lb

## 2021-03-31 VITALS — BP 126/84 | HR 86 | Resp 16 | Ht 78.0 in | Wt 193.8 lb

## 2021-03-31 DIAGNOSIS — G510 Bell's palsy: Secondary | ICD-10-CM | POA: Diagnosis not present

## 2021-03-31 DIAGNOSIS — Z801 Family history of malignant neoplasm of trachea, bronchus and lung: Secondary | ICD-10-CM

## 2021-03-31 DIAGNOSIS — I454 Nonspecific intraventricular block: Secondary | ICD-10-CM | POA: Diagnosis present

## 2021-03-31 DIAGNOSIS — E782 Mixed hyperlipidemia: Secondary | ICD-10-CM

## 2021-03-31 DIAGNOSIS — I2510 Atherosclerotic heart disease of native coronary artery without angina pectoris: Secondary | ICD-10-CM

## 2021-03-31 DIAGNOSIS — I482 Chronic atrial fibrillation, unspecified: Secondary | ICD-10-CM | POA: Diagnosis present

## 2021-03-31 DIAGNOSIS — L0390 Cellulitis, unspecified: Secondary | ICD-10-CM | POA: Diagnosis present

## 2021-03-31 DIAGNOSIS — I255 Ischemic cardiomyopathy: Secondary | ICD-10-CM | POA: Diagnosis not present

## 2021-03-31 DIAGNOSIS — Z7901 Long term (current) use of anticoagulants: Secondary | ICD-10-CM

## 2021-03-31 DIAGNOSIS — G4730 Sleep apnea, unspecified: Secondary | ICD-10-CM

## 2021-03-31 DIAGNOSIS — Z87891 Personal history of nicotine dependence: Secondary | ICD-10-CM

## 2021-03-31 DIAGNOSIS — I5022 Chronic systolic (congestive) heart failure: Secondary | ICD-10-CM | POA: Diagnosis present

## 2021-03-31 DIAGNOSIS — I447 Left bundle-branch block, unspecified: Secondary | ICD-10-CM | POA: Diagnosis not present

## 2021-03-31 DIAGNOSIS — Z951 Presence of aortocoronary bypass graft: Secondary | ICD-10-CM

## 2021-03-31 DIAGNOSIS — E119 Type 2 diabetes mellitus without complications: Secondary | ICD-10-CM | POA: Diagnosis not present

## 2021-03-31 DIAGNOSIS — E785 Hyperlipidemia, unspecified: Secondary | ICD-10-CM | POA: Diagnosis present

## 2021-03-31 DIAGNOSIS — Z79899 Other long term (current) drug therapy: Secondary | ICD-10-CM | POA: Diagnosis not present

## 2021-03-31 DIAGNOSIS — L03115 Cellulitis of right lower limb: Secondary | ICD-10-CM | POA: Diagnosis not present

## 2021-03-31 DIAGNOSIS — Z96652 Presence of left artificial knee joint: Secondary | ICD-10-CM | POA: Diagnosis present

## 2021-03-31 DIAGNOSIS — I4819 Other persistent atrial fibrillation: Secondary | ICD-10-CM | POA: Diagnosis not present

## 2021-03-31 DIAGNOSIS — I110 Hypertensive heart disease with heart failure: Secondary | ICD-10-CM | POA: Diagnosis present

## 2021-03-31 DIAGNOSIS — Z8249 Family history of ischemic heart disease and other diseases of the circulatory system: Secondary | ICD-10-CM | POA: Diagnosis not present

## 2021-03-31 DIAGNOSIS — Z823 Family history of stroke: Secondary | ICD-10-CM | POA: Diagnosis not present

## 2021-03-31 DIAGNOSIS — I10 Essential (primary) hypertension: Secondary | ICD-10-CM

## 2021-03-31 DIAGNOSIS — J90 Pleural effusion, not elsewhere classified: Secondary | ICD-10-CM | POA: Diagnosis not present

## 2021-03-31 DIAGNOSIS — Z833 Family history of diabetes mellitus: Secondary | ICD-10-CM | POA: Diagnosis not present

## 2021-03-31 DIAGNOSIS — G4733 Obstructive sleep apnea (adult) (pediatric): Secondary | ICD-10-CM | POA: Diagnosis not present

## 2021-03-31 DIAGNOSIS — Z85820 Personal history of malignant melanoma of skin: Secondary | ICD-10-CM | POA: Diagnosis not present

## 2021-03-31 DIAGNOSIS — Z7984 Long term (current) use of oral hypoglycemic drugs: Secondary | ICD-10-CM

## 2021-03-31 DIAGNOSIS — Z8679 Personal history of other diseases of the circulatory system: Secondary | ICD-10-CM

## 2021-03-31 DIAGNOSIS — R0600 Dyspnea, unspecified: Secondary | ICD-10-CM | POA: Diagnosis not present

## 2021-03-31 DIAGNOSIS — Z9889 Other specified postprocedural states: Secondary | ICD-10-CM | POA: Diagnosis not present

## 2021-03-31 DIAGNOSIS — E1129 Type 2 diabetes mellitus with other diabetic kidney complication: Secondary | ICD-10-CM

## 2021-03-31 LAB — MAGNESIUM: Magnesium: 1.7 mg/dL (ref 1.6–2.3)

## 2021-03-31 LAB — BASIC METABOLIC PANEL
Anion gap: 11 (ref 5–15)
BUN/Creatinine Ratio: 21 (ref 10–24)
BUN: 20 mg/dL (ref 8–27)
BUN: 21 mg/dL (ref 8–23)
CO2: 22 mmol/L (ref 20–29)
CO2: 23 mmol/L (ref 22–32)
Calcium: 9.8 mg/dL (ref 8.6–10.2)
Calcium: 9.4 mg/dL (ref 8.9–10.3)
Chloride: 96 mmol/L (ref 96–106)
Chloride: 100 mmol/L (ref 98–111)
Creatinine, Ser: 0.97 mg/dL (ref 0.76–1.27)
Creatinine, Ser: 1.04 mg/dL (ref 0.61–1.24)
GFR, Estimated: >60 mL/min (ref >60)
Glucose, Bld: 185 mg/dL — ABNORMAL HIGH (ref 70–99)
Glucose: 160 mg/dL — ABNORMAL HIGH (ref 70–99)
Potassium: 4.9 mmol/L (ref 3.5–5.2)
Potassium: 4.6 mmol/L (ref 3.5–5.1)
Sodium: 134 mmol/L (ref 134–144)
Sodium: 134 mmol/L — ABNORMAL LOW (ref 135–145)
eGFR: 81 mL/min/{1.73_m2} (ref >59)

## 2021-03-31 LAB — CBC
HCT: 41.3 % (ref 39.0–52.0)
Hemoglobin: 13.6 g/dL (ref 13.0–17.0)
MCH: 29.6 pg (ref 26.0–34.0)
MCHC: 32.9 g/dL (ref 30.0–36.0)
MCV: 90 fL (ref 80.0–100.0)
Platelets: 292 10*3/uL (ref 150–400)
RBC: 4.59 MIL/uL (ref 4.22–5.81)
RDW: 15.3 % (ref 11.5–15.5)
WBC: 5 10*3/uL (ref 4.0–10.5)
nRBC: 0 % (ref 0.0–0.2)

## 2021-03-31 LAB — BRAIN NATRIURETIC PEPTIDE: B Natriuretic Peptide: 427.7 pg/mL — ABNORMAL HIGH (ref 0.0–100.0)

## 2021-03-31 LAB — PRO B NATRIURETIC PEPTIDE: NT-Pro BNP: 1722 pg/mL — ABNORMAL HIGH (ref 0–486)

## 2021-03-31 LAB — TSH: TSH: 6.22 u[IU]/mL — ABNORMAL HIGH (ref 0.450–4.500)

## 2021-03-31 MED ORDER — ONDANSETRON HCL 4 MG/2ML IJ SOLN: 4.0000 mg | Freq: Four times a day (QID) | INTRAMUSCULAR | Status: DC | PRN

## 2021-03-31 MED ORDER — APIXABAN 5 MG PO TABS
5.0000 mg | ORAL_TABLET | Freq: Two times a day (BID) | ORAL | Status: DC
  Administered 2021-03-31 – 2021-04-02 (×4): 5 mg via ORAL
  Filled 2021-03-31 (×4): qty 1

## 2021-03-31 MED ORDER — DAPAGLIFLOZIN PROPANEDIOL 10 MG PO TABS
10.0000 mg | ORAL_TABLET | Freq: Every day | ORAL | Status: DC
  Administered 2021-03-31 – 2021-04-02 (×3): 10 mg via ORAL
  Filled 2021-03-31 (×3): qty 1

## 2021-03-31 MED ORDER — CARVEDILOL 3.125 MG PO TABS
3.1250 mg | ORAL_TABLET | Freq: Two times a day (BID) | ORAL | Status: DC
  Administered 2021-04-01 – 2021-04-02 (×3): 3.125 mg via ORAL
  Filled 2021-03-31 (×3): qty 1

## 2021-03-31 MED ORDER — METFORMIN HCL 500 MG PO TABS
1000.0000 mg | ORAL_TABLET | Freq: Two times a day (BID) | ORAL | Status: DC
  Administered 2021-04-01 – 2021-04-02 (×3): 1000 mg via ORAL
  Filled 2021-03-31 (×3): qty 2

## 2021-03-31 MED ORDER — ALPRAZOLAM 0.5 MG PO TABS
0.5000 mg | ORAL_TABLET | Freq: Every evening | ORAL | Status: DC | PRN
  Administered 2021-04-01: 0.5 mg via ORAL
  Filled 2021-03-31: qty 1

## 2021-03-31 MED ORDER — INSULIN ASPART 100 UNIT/ML IJ SOLN
0.0000 [IU] | Freq: Three times a day (TID) | INTRAMUSCULAR | Status: DC
  Administered 2021-04-01 – 2021-04-02 (×3): 3 [IU] via SUBCUTANEOUS

## 2021-03-31 MED ORDER — ASPIRIN EC 81 MG PO TBEC
81.0000 mg | DELAYED_RELEASE_TABLET | Freq: Every day | ORAL | Status: DC
  Administered 2021-04-01 – 2021-04-02 (×2): 81 mg via ORAL
  Filled 2021-03-31 (×3): qty 1

## 2021-03-31 MED ORDER — VANCOMYCIN HCL 1000 MG/200ML IV SOLN
1000.0000 mg | Freq: Two times a day (BID) | INTRAVENOUS | Status: DC
  Administered 2021-04-01 – 2021-04-02 (×3): 1000 mg via INTRAVENOUS
  Filled 2021-03-31 (×4): qty 200

## 2021-03-31 MED ORDER — TRAMADOL HCL 50 MG PO TABS: 50.0000 mg | ORAL_TABLET | Freq: Four times a day (QID) | ORAL | Status: DC | PRN

## 2021-03-31 MED ORDER — VANCOMYCIN HCL 1750 MG/350ML IV SOLN
1750.0000 mg | Freq: Once | INTRAVENOUS | Status: AC
  Administered 2021-03-31: 1750 mg via INTRAVENOUS
  Filled 2021-03-31: qty 350

## 2021-03-31 MED ORDER — POLYETHYLENE GLYCOL 3350 17 G PO PACK: 17.0000 g | PACK | Freq: Every day | ORAL | Status: DC | PRN

## 2021-03-31 MED ORDER — SACUBITRIL-VALSARTAN 49-51 MG PO TABS
1.0000 | ORAL_TABLET | Freq: Two times a day (BID) | ORAL | Status: DC
  Administered 2021-03-31 – 2021-04-02 (×4): 1 via ORAL
  Filled 2021-03-31 (×5): qty 1

## 2021-03-31 MED ORDER — AMIODARONE HCL 100 MG PO TABS: 100.0000 mg | ORAL_TABLET | Freq: Every day | ORAL | Status: DC

## 2021-03-31 MED ORDER — ONDANSETRON 4 MG PO TBDP: 4.0000 mg | ORAL_TABLET | Freq: Four times a day (QID) | ORAL | Status: DC | PRN

## 2021-03-31 MED ORDER — ROSUVASTATIN CALCIUM 5 MG PO TABS
5.0000 mg | ORAL_TABLET | Freq: Every day | ORAL | Status: DC
  Administered 2021-03-31 – 2021-04-01 (×2): 5 mg via ORAL
  Filled 2021-03-31 (×2): qty 1

## 2021-03-31 MED ORDER — AMIODARONE HCL 100 MG PO TABS
100.0000 mg | ORAL_TABLET | Freq: Every day | ORAL | Status: DC
  Administered 2021-03-31 – 2021-04-02 (×3): 100 mg via ORAL
  Filled 2021-03-31 (×3): qty 1

## 2021-03-31 MED ORDER — PANTOPRAZOLE SODIUM 40 MG PO TBEC
40.0000 mg | DELAYED_RELEASE_TABLET | Freq: Every day | ORAL | Status: DC
  Administered 2021-03-31 – 2021-04-02 (×3): 40 mg via ORAL
  Filled 2021-03-31 (×3): qty 1

## 2021-03-31 MED ORDER — ACETAMINOPHEN 500 MG PO TABS: 1000.0000 mg | ORAL_TABLET | Freq: Four times a day (QID) | ORAL | Status: DC | PRN

## 2021-03-31 NOTE — Progress Notes (Signed)
Pharmacy Antibiotic Note  Derek Bond is a 75 y.o. male admitted on 03/31/2021 with cellulitis.  Pharmacy has been consulted for Vancomycin  dosing.  Plan: Vancomycin 1750 mg iv x 1 dose now then 1 gram iv Q 12 hours LOT planned is 7 days for now     No data recorded.  Recent Labs  Lab 03/30/21 1005  CREATININE 0.97    Estimated Creatinine Clearance: 81.4 mL/min (by C-G formula based on SCr of 0.97 mg/dL).    No Known Allergies  Thank you for allowing pharmacy to be a part of this patient's care.  Tad Moore 03/31/2021 8:25 PM

## 2021-03-31 NOTE — Progress Notes (Signed)
Date:  03/31/2021   ID:  Derek Bond, DOB 04-Sep-1945, MRN [id number]  PCP:  Glenda Chroman, MD  Cardiologist:  Rex Kras, DO, [place] (established care 01/26/2021)  Date: 03/31/21 Last Office Visit: 03/13/2021   Chief Complaint  Patient presents with   heart failure management   Follow-up    HPI  Derek Bond is a 75 y.o. male who presents to the office with a chief complaint of "  heart failure management." Patient's past medical history and cardiovascular risk factors include: Ischemic cardiomyopathy, acute/chronic heart failure with reduced EF, hypertension, diabetes mellitus with microalbuminuria, erectile dysfunction, heartburn, former smoker, hyperlipidemia, sleep apnea not on CPAP, persistent atrial fibrillation status post Maze procedure, advanced age.  He is referred to the office at the request of [name], MD for evaluation of shortness of breath.  Patient is accompanied by his daughter Chrys Racer at today's office visit.  He verbally provides verbal consent with regards to discussing his medical information in her presence.  Initially presented to the office for shortness of breath evaluation and on surface EKG during initial consultation he was noted to be in atrial fibrillation.  He was started on rate control strategy and oral anticoagulation.  Given his symptoms of precordial discomfort that shared decision was to proceed with left heart catheterization.  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  Since then we have been uptitrating his GDMT given his underlying heart failure.  At the last office visit the dose of amiodarone was reduced to 200 mg p.o. daily repeat TSH is elevated.  Patient's discharge weight was 210 pounds, average weight according to family is 207  pounds, and he has lost approximately 16 pounds in the last office visit.  This is secondary to diuresis and also poor oral intake.  Patient's daughter is concerned of episodes of hypoglycemia in the morning and trouble sleeping at night.  He is now off of insulin.  His Entresto was also increased to 49/51 mg last week and he is tolerated the up titration well.  Repeat blood work notes stable renal function and potassium as of 03/30/2021.  He has a follow-up visit with CT surgery later today with hopes of possibly being released for cardiac rehab and to reevaluate his right lower extremity cellulitis at the site of endovascular harvesting  FUNCTIONAL STATUS: No structured exercise program or daily routine.   ALLERGIES: No Known Allergies  MEDICATION LIST PRIOR TO VISIT: Current Meds  Medication Sig   acetaminophen (TYLENOL) 500 MG tablet Take 1,000 mg by mouth every 6 (six) hours as needed for moderate pain or mild pain.   ALPRAZolam (XANAX) 0.5 MG tablet Take 0.5 mg by mouth at bedtime as needed for anxiety.   apixaban (ELIQUIS) 5 MG TABS tablet Take 1 tablet (5 mg total) by mouth 2 (two) times daily.   aspirin EC 81 MG tablet Take 81 mg by mouth daily.   carvedilol (COREG) 3.125 MG tablet Take 1 tablet (3.125 mg total) by mouth 2 (two) times daily with a meal.   cephALEXin (KEFLEX) 500 MG capsule Take 1 capsule (500 mg total) by mouth 3 (three) times daily for 10 days.   dapagliflozin propanediol (FARXIGA) 10 MG TABS tablet Take 1 tablet (10 mg total) by mouth daily before breakfast.   glipiZIDE (GLUCOTROL XL)  10 MG 24 hr tablet Take 10 mg by mouth 2 (two) times daily.   Magnesium Oxide 400 MG CAPS Take 1 capsule (400 mg total) by mouth in the morning and at bedtime. (Patient taking differently: Take 400 mg by mouth 2 (two) times daily.)   metFORMIN (GLUCOPHAGE) 1000 MG tablet Take 1,000 mg by mouth 2 (two) times daily with a meal.   pantoprazole (PROTONIX) 40 MG tablet Take 40 mg by mouth  daily.   rosuvastatin (CRESTOR) 5 MG tablet Take 5 mg by mouth at bedtime.   sacubitril-valsartan (ENTRESTO) 49-51 MG Take 1 tablet by mouth 2 (two) times daily.   traMADol (ULTRAM) 50 MG tablet Take 1 tablet (50 mg total) by mouth every 4 (four) hours as needed for moderate pain.   [DISCONTINUED] amiodarone (PACERONE) 200 MG tablet Take 1 tablet (200 mg total) by mouth daily.     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    Due to laceration at age 81    Hiatal hernia    History of melanoma    Obstructive sleep apnea    Type 2 diabetes mellitus ([place])     PAST SURGICAL HISTORY: Past Surgical History:  Procedure Laterality Date   BIOPSY  12/21/2019   Procedure: BIOPSY;  Surgeon: Harvel Quale, MD;  [facility];  Service: Gastroenterology;;  random colon   CLIPPING OF ATRIAL APPENDAGE N/A 02/26/2021   Procedure: CLIPPING OF ATRIAL APPENDAGE USING ATRICURE CLIP SIZE 17;  Surgeon: Lajuana Matte, MD;  Location: [place];  Service: Open Heart Surgery;  Laterality: N/A;   COLONOSCOPY WITH PROPOFOL N/A 12/21/2019   Procedure: COLONOSCOPY WITH PROPOFOL;  Surgeon: Harvel Quale, MD;  [facility];  Service: Gastroenterology;  Laterality: N/A;  915   CORONARY ARTERY BYPASS GRAFT N/A 02/26/2021   Procedure: CORONARY ARTERY BYPASS GRAFTING (CABG) X4, ON PUMP, USING LEFT INTERNAL MAMMARY ARTERY AND RIGHT ENDOSCOPICALLY HARVESTED GREATER SAPHENOUS VEIN;  Surgeon: Lajuana Matte, MD;  Location: [place];  Service: Open Heart Surgery;  Laterality: N/A;   ENDOVEIN HARVEST OF GREATER SAPHENOUS VEIN Right 02/26/2021   Procedure: ENDOVEIN HARVEST OF GREATER SAPHENOUS VEIN;  Surgeon: Lajuana Matte, MD;  Location: [place];   Service: Open Heart Surgery;  Laterality: Right;   MAZE N/A 02/26/2021   Procedure: MAZE;  Surgeon: Lajuana Matte, MD;  Location: [place];  Service: Open Heart Surgery;  Laterality: N/A;   MELANOMA EXCISION     Left facial region   REPLACEMENT TOTAL KNEE Left    RIGHT/LEFT HEART CATH AND CORONARY ANGIOGRAPHY N/A 02/23/2021   Procedure: RIGHT/LEFT HEART CATH AND CORONARY ANGIOGRAPHY;  Surgeon: Nigel Mormon, MD;  Location: [place] [facility];  Service: Cardiovascular;  Laterality: N/A;   TEE WITHOUT CARDIOVERSION N/A 02/26/2021   Procedure: TRANSESOPHAGEAL ECHOCARDIOGRAM (TEE);  Surgeon: Lajuana Matte, MD;  Location: [place];  Service: Open Heart Surgery;  Laterality: N/A;   UMBILICAL HERNIA REPAIR      FAMILY HISTORY: The patient family history includes Diabetes in his brother, mother, and another family member; Hypertension in his brother, brother, sister, sister, and another family member; Lung cancer in his father; Rheum arthritis in an other family member; Stroke in  an other family member.  SOCIAL HISTORY:  The patient  reports that he quit smoking about 31 years ago. His smoking use included cigarettes. He has a 60.00 pack-year smoking history. He has never used smokeless tobacco. He reports that he does not currently use alcohol. He reports that he does not use drugs.  REVIEW OF SYSTEMS: Review of Systems  Constitutional: Negative for chills, fever and malaise/fatigue.  HENT:  Negative for hoarse voice and nosebleeds.   Eyes:  Negative for discharge, double vision and pain.  Cardiovascular:  Negative for chest pain, claudication, dyspnea on exertion, leg swelling, near-syncope, orthopnea, palpitations, paroxysmal nocturnal dyspnea and syncope.  Respiratory:  Negative for hemoptysis and shortness of breath.   Endocrine: Negative for heat intolerance.  Musculoskeletal:  Negative for muscle cramps and myalgias.  Gastrointestinal:  Negative for abdominal pain,  constipation, diarrhea, heartburn, hematemesis, hematochezia, melena, nausea and vomiting.  Neurological:  Negative for dizziness and light-headedness.   PHYSICAL EXAM: Vitals with BMI 03/31/2021 03/23/2021 03/13/2021  Height 6\' 6"  6\' 6"  6\' 6"   Weight 193 lbs 13 oz 197 lbs 6 oz 209 lbs  BMI 22.4 32.12 24.82  Systolic 500 370 488  Diastolic 84 70 77  Pulse 86 111 84    CONSTITUTIONAL: Well-developed and well-nourished. No acute distress.  Presents in a wheelchair. SKIN: Skin is warm and dry. No rash noted. No cyanosis. No pallor. No jaundice HEAD: Normocephalic and atraumatic.  EYES: No scleral icterus MOUTH/THROAT: Moist oral membranes.  NECK: No JVD present. No thyromegaly noted. No carotid bruits  LYMPHATIC: No visible cervical adenopathy.  CHEST Normal respiratory effort. No intercostal retractions.  Sternotomy site is healing well. LUNGS: Clear to auscultation bilaterally.  No stridor. No wheezes. No rales.  CARDIOVASCULAR: Irregularly irregular, variable S1-S2, no murmurs rubs or gallops appreciated secondary to tachycardia. ABDOMINAL: Soft, nontender, nondistended, positive bowel sounds in all 4 quadrants, no apparent ascites.  EXTREMITIES: No pitting edema, warm to touch bilaterally. HEMATOLOGIC: No significant bruising NEUROLOGIC: Oriented to person, place, and time. Nonfocal. Normal muscle tone.  PSYCHIATRIC: Normal mood and affect. Normal behavior. Cooperative  CARDIAC DATABASE: 02/26/2021: CABG x4 (LIMA to LAD, SVG to DIAGONAL, SVG to OM, SVG to PLB) + MAZE + 45 mm atrial clip placement.  EKG: 01/26/2021: Atrial fibrillation with rapid ventricular rate 126 bpm, left axis deviation, left anterior fascicular block, left bundle branch block, consider old inferior infarct.  02/11/2021: Atrial fibrillation, 105 bpm, left axis, left bundle branch block.   Echocardiogram: 02/05/2021:  Severely depressed LV systolic function with visual EF 25-30%. Hypokinetic global wall motion  with regionality involving septal wall. Left ventricle cavity is mildly dilated. Moderate left ventricular hypertrophy. Unable to evaluate diastolic function due to atrial fibrillation. Elevated LAP.  Moderate (Grade II) mitral regurgitation.  Moderate tricuspid regurgitation. Mild pulmonary hypertension, RVSP 45mmHg.  IVC is dilated with a respiratory response of <50%.  No prior study for comparison.  Stress Testing: MPI 02/12/2016: No diagnostic ST segment changes to indicate ischemia. No significant arrhythmias. Small, mild intensity, partially reversible mid to basal inferior defect suggestive of variable soft tissue attenuation. No large ischemic zones are noted. This is a low risk study. Nuclear stress EF: 55%.  Heart Catheterization: Left and right heart catheterization 02/23/2021 LM: Normal LAD: Prox 80% stenosius at bifurcation with small to medium caliber D1 with 75% long prox disease. Mid 70% disease Ramus: Minimal luminal irregularities Lcx: OM1 with prox 80% stenosis RCA: 60-70% disease in PL branches   RA: 2 mmHg  RV: 22/0 mmHg PA: 21/5 mmHg, mPAP 13 mmHg PCW: 8 mmHg   CO: 5.6 L/min CI: 2.5 L/min/m2     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%, diabetic patient CVTS consult for CABG  LABORATORY DATA: CBC Latest Ref Rng & Units 03/23/2021 03/02/2021 03/01/2021  WBC 3.4 - 10.8 x10E3/uL 7.6 6.7 7.1  Hemoglobin 13.0 - 17.7 g/dL 14.4 12.9(L) 11.5(L)  Hematocrit 37.5 - 51.0 % 44.3 39.0 34.7(L)  Platelets 150 - 450 x10E3/uL 407 171 166    CMP Latest Ref Rng & Units 03/30/2021 03/23/2021 03/02/2021  Glucose 70 - 99 mg/dL 160(H) 204(H) 174(H)  BUN 8 - 27 mg/dL 20 21 22   Creatinine 0.76 - 1.27 mg/dL 0.97 1.09 0.76  Sodium 134 - 144 mmol/L 134 135 131(L)  Potassium 3.5 - 5.2 mmol/L 4.9 5.6(H) 4.4  Chloride 96 - 106 mmol/L 96 96 102  CO2 20 - 29 mmol/L 22 23 23   Calcium 8.6 - 10.2 mg/dL 9.8 10.2 8.8(L)  Total Protein 6.5 - 8.1 g/dL - - -  Total Bilirubin 0.3 -  1.2 mg/dL - - -  Alkaline Phos 38 - 126 U/L - - -  AST 15 - 41 U/L - - -  ALT 0 - 44 U/L - - -    Lipid Panel     Component Value Date/Time   CHOL 99 02/22/2021 0131   TRIG 56 02/22/2021 0131   HDL 44 02/22/2021 0131   CHOLHDL 2.3 02/22/2021 0131   VLDL 11 02/22/2021 0131   LDLCALC 44 02/22/2021 0131    No components found for: NTPROBNP Recent Labs    02/05/21 1441 02/18/21 1534 03/23/21 1459 03/30/21 0957  PROBNP 1,567* 2,355* 1,938* 1,722*   Recent Labs    02/21/21 1347 02/22/21 0131 03/30/21 0958  TSH 1.615 2.348 6.220*    BMP Recent Labs    02/28/21 0208 03/01/21 0425 03/02/21 0126 03/23/21 1459 03/30/21 1005  NA 133* 133* 131* 135 134  K 4.1 4.6 4.4 5.6* 4.9  CL 106 102 102 96 96  CO2 23 26 23 23 22   GLUCOSE 160* 148* 174* 204* 160*  BUN 12 22 22 21 20   CREATININE 0.68 0.75 0.76 1.09 0.97  CALCIUM 8.2* 8.9 8.8* 10.2 9.8  GFRNONAA >60 >60 >60  --   --     HEMOGLOBIN A1C Lab Results  Component Value Date   HGBA1C 8.1 (H) 02/22/2021   MPG 185.77 02/22/2021    IMPRESSION:    ICD-10-CM   1. Chronic HFrEF (heart failure with reduced ejection fraction) (HCC)  I50.22 Pro b natriuretic peptide (BNP)    Basic metabolic panel    Magnesium    2. Atherosclerosis of native coronary artery of native heart without angina pectoris  I25.10     3. S/P CABG x 4  Z95.1     4. Ischemic cardiomyopathy  I25.5     5. LBBB (left bundle branch block)  I44.7     6. Persistent atrial fibrillation (HCC)  I48.19 amiodarone (PACERONE) 100 MG tablet    7. S/P Maze for atrial fibrillation, 49mm LAA clip.   Z98.890    Z86.79     8. Long term current use of antiarrhythmic drug  Z79.899 TSH    9. Long term (current) use of anticoagulants  Z79.01 Hemoglobin and hematocrit, blood    10. Type 2 diabetes mellitus with microalbuminuria, without long-term current use of insulin (HCC)  E11.29    R80.9     11. Mixed hyperlipidemia  E78.2     12. Benign hypertension   I10     13. Sleep apnea, unspecified type  G47.30     14. Former smoker  Z87.891         RECOMMENDATIONS: Derek Bond is a 75 y.o. male whose past medical history and cardiac risk factors include: Ischemic cardiomyopathy, acute/chronic heart failure with reduced EF, hypertension, diabetes mellitus with microalbuminuria, erectile dysfunction, heartburn, former smoker, hyperlipidemia, sleep apnea not on CPAP, persistent atrial fibrillation status post Maze procedure, advanced age.  Acute HFrEF (heart failure with reduced ejection fraction)/ischemic cardiomyopathy (HCC) Stage C, NYHA class II Medications reconciled. Echo 02/25/2021: LVEF 30-35% Recommend daily weight check, strict I/O's Fluid restriction to <2L per day, Na restriction < 1.5g per day Entresto uptitrated to 49/51 mg p.o. twice daily.  Repeat blood work no stable renal function and potassium levels. Will hold off on further titration of GDMT given his symptoms of tired/fatigue, elevated TSH levels, weight loss, and poor oral intake.   Atherosclerosis of native coronary artery of native heart, four-vessel bypass, without angina pectoris Denies angina pectoris. No use of sublingual nitroglycerin tablets. Once cleared by cardiothoracic surgery patient will be enrolled into cardiac rehab.  S/P CABG x 4 See above. Educated on the importance of improving his modifiable cardiovascular risk factors.  Persistent atrial fibrillation (HCC) Rate control: Carvedilol. Rhythm control: Amiodarone. Thromboembolic prophylaxis: Eliquis CHA2DS2-VASc SCORE is 5 which correlates to 6.7% risk of stroke per year. At last office visit he was describing heat intolerance and therefore amiodarone was reduced to 200 mg p.o. daily.  Repeat TSH notes values are within normal limits suggestive of hypothyroidism.  Will reduce the amiodarone to 100 mg p.o. daily.  I have asked him to follow-up with PCP for management of hypothyroidism to see if  additional medication changes are warranted. I will recheck TSH in 6 weeks to reevaluate therapy  Long term (current) use of anticoagulant & S/P Maze for atrial fibrillation, 6mm LAA clip.  Indication: Persistent atrial fibrillation. Patient did restore normal sinus rhythm shortly after his bypass surgery went back into atrial fibrillation. Reemphasized the risks, benefits, and alternatives to oral anticoagulation. Patient does not endorse any evidence of bleeding. Will check hemoglobin and hematocrit prior to the next office visit. Once the acute problems have resolved/addressed if he continues to feel tired and fatigued may consider restarting normal sinus rhythm with TEE guided cardioversion.  We will discuss at the next office visit.  Type 2 diabetes mellitus with microalbuminuria, without long-term current use of insulin (HCC) Due to episodes of hypoglycemia he is no longer on insulin therapy. Continue [place], statin therapy Educated on the importance of glycemic control.  Mixed hyperlipidemia Currently on Crestor.   He denies myalgia or other side effects. Most recent lipids dated 02/2021 reviewed as noted above.  LDL currently at goal  Benign hypertension Office blood pressures within acceptable range. Reemphasized importance of low-salt diet. Medications reconciled.  This was a 45-minute encounter: Discussing disease management and medication changes, spoke to the patient and his daughter at length with regards to his thyroid function (changing dose of amiodarone, and discussing with PCP as well), to be reevaluated by CT surgery with regards to right lower extremity cellulitis, starting cardiac rehab, increasing oral intake, and following up with PCP to discuss glycemia and thyroid function.  FINAL MEDICATION LIST END OF ENCOUNTER: Meds ordered this encounter  Medications   amiodarone (PACERONE) 100 MG tablet    Sig: Take 1 tablet (  100 mg total) by mouth daily.    Medications Discontinued During This Encounter  Medication Reason   insulin degludec (TRESIBA FLEXTOUCH) 100 UNIT/ML FlexTouch Pen Error   amiodarone (PACERONE) 200 MG tablet Reorder      Current Outpatient Medications:    acetaminophen (TYLENOL) 500 MG tablet, Take 1,000 mg by mouth every 6 (six) hours as needed for moderate pain or mild pain., Disp: , Rfl:    ALPRAZolam (XANAX) 0.5 MG tablet, Take 0.5 mg by mouth at bedtime as needed for anxiety., Disp: , Rfl:    apixaban (ELIQUIS) 5 MG TABS tablet, Take 1 tablet (5 mg total) by mouth 2 (two) times daily., Disp: 180 tablet, Rfl: 0   aspirin EC 81 MG tablet, Take 81 mg by mouth daily., Disp: , Rfl:    carvedilol (COREG) 3.125 MG tablet, Take 1 tablet (3.125 mg total) by mouth 2 (two) times daily with a meal., Disp: 30 tablet, Rfl: 1   cephALEXin (KEFLEX) 500 MG capsule, Take 1 capsule (500 mg total) by mouth 3 (three) times daily for 10 days., Disp: 30 capsule, Rfl: 0   dapagliflozin propanediol (FARXIGA) 10 MG TABS tablet, Take 1 tablet (10 mg total) by mouth daily before breakfast., Disp: 90 tablet, Rfl: 0   glipiZIDE (GLUCOTROL XL) 10 MG 24 hr tablet, Take 10 mg by mouth 2 (two) times daily., Disp: , Rfl:    Magnesium Oxide 400 MG CAPS, Take 1 capsule (400 mg total) by mouth in the morning and at bedtime. (Patient taking differently: Take 400 mg by mouth 2 (two) times daily.), Disp: 180 capsule, Rfl: 0   metFORMIN (GLUCOPHAGE) 1000 MG tablet, Take 1,000 mg by mouth 2 (two) times daily with a meal., Disp: , Rfl:    pantoprazole (PROTONIX) 40 MG tablet, Take 40 mg by mouth daily., Disp: , Rfl:    rosuvastatin (CRESTOR) 5 MG tablet, Take 5 mg by mouth at bedtime., Disp: , Rfl:    sacubitril-valsartan (ENTRESTO) 49-51 MG, Take 1 tablet by mouth 2 (two) times daily., Disp: , Rfl:    traMADol (ULTRAM) 50 MG tablet, Take 1 tablet (50 mg total) by mouth every 4 (four) hours as needed for moderate pain., Disp: 30 tablet, Rfl: 0   amiodarone  (PACERONE) 100 MG tablet, Take 1 tablet (100 mg total) by mouth daily., Disp: , Rfl:   Orders Placed This Encounter  Procedures   TSH   Pro b natriuretic peptide (BNP)   Basic metabolic panel   Magnesium   Hemoglobin and hematocrit, blood     There are no Patient Instructions on file for this visit.   --Continue cardiac medications as reconciled in final medication list. --Return in about 7 weeks (around 05/19/2021) for Follow up, CAD, heart failure management.. Or sooner if needed. --Continue follow-up with your primary care physician regarding the management of your other chronic comorbid conditions.  Patient's questions and concerns were addressed to his satisfaction. He voices understanding of the instructions provided during this encounter.   This note was created using a voice recognition software as a result there may be grammatical errors inadvertently enclosed that do not reflect the nature of this encounter. Every attempt is made to correct such errors.  Rex Kras, [place], [facility]  Pager: [phone] Office: [phone]

## 2021-03-31 NOTE — Progress Notes (Addendum)
HPI: This is a 75 year old male with a past medical history of diabetes mellitus, remote tobacco abuse, melanoma, hypertension, and OSA who underwent a CABG x 4, MAZE, LA clip by Dr. Kipp Brood on 02/26/2021. His PCP treated him with Amoxicillin for cellulitis of the RLE. He saw a physician assistant in our office last Monday. At that time, there was no drainage from the [facility] wound, only cellulitis. He was changed to Keflex and instructed to follow up on 11/16. His daughter called the office today as she thinks cellulitis looks worse and he has some swelling in that leg. Patient denies fever, chills, or drainage from RLE wound. He also denies chest pain or shortness of breath. He does not have much appetite and states food tastes different.  Current Outpatient Medications  Medication Sig Dispense Refill   acetaminophen (TYLENOL) 500 MG tablet Take 1,000 mg by mouth every 6 (six) hours as needed for moderate pain or mild pain.     ALPRAZolam (XANAX) 0.5 MG tablet Take 0.5 mg by mouth at bedtime as needed for anxiety.     amiodarone (PACERONE) 200 MG tablet Take 1 tablet (200 mg total) by mouth daily. 80 tablet 1   apixaban (ELIQUIS) 5 MG TABS tablet Take 1 tablet (5 mg total) by mouth 2 (two) times daily. 180 tablet 0   aspirin EC 81 MG tablet Take 81 mg by mouth daily.     carvedilol (COREG) 3.125 MG tablet Take 1 tablet (3.125 mg total) by mouth 2 (two) times daily with a meal. 30 tablet 1   cephALEXin (KEFLEX) 500 MG capsule Take 1 capsule (500 mg total) by mouth 3 (three) times daily for 10 days. 30 capsule 0   dapagliflozin propanediol (FARXIGA) 10 MG TABS tablet Take 1 tablet (10 mg total) by mouth daily before breakfast. 90 tablet 0   glipiZIDE (GLUCOTROL XL) 10 MG 24 hr tablet Take 10 mg by mouth 2 (two) times daily.     insulin degludec (TRESIBA FLEXTOUCH) 100 UNIT/ML FlexTouch Pen Inject 25 Units into the skin daily. 100 mL 0   Magnesium Oxide 400 MG CAPS Take 1 capsule (400 mg total) by  mouth in the morning and at bedtime. 180 capsule 0   metFORMIN (GLUCOPHAGE) 1000 MG tablet Take 1,000 mg by mouth 2 (two) times daily with a meal.     pantoprazole (PROTONIX) 40 MG tablet Take 40 mg by mouth daily.     rosuvastatin (CRESTOR) 5 MG tablet Take 5 mg by mouth at bedtime.     sacubitril-valsartan (ENTRESTO) 49-51 MG Take 1 tablet by mouth 2 (two) times daily.     traMADol (ULTRAM) 50 MG tablet Take 1 tablet (50 mg total) by mouth every 4 (four) hours as needed for moderate pain. 30 tablet 0  Vital Signs: Vitals:   03/31/21 1324  BP: 107/71  Pulse: 100  Resp: 20  SpO2: 95%    Physical Exam: CV-IRRR IRRR Pulmonary-Slightly diminished left base;right lung is clear Extremities-Cellulitis (erythema) RLE;no LLE edema Wounds-Sternal and right thigh wounds are clean, dry and healed without signs of infection  Diagnostic Imaging:    CLINICAL DATA:  Status post coronary bypass graft.   EXAM: CHEST - 2 VIEW   COMPARISON:  March 01, 2021.   FINDINGS: Stable cardiomediastinal silhouette. Right lung is clear. Small left pleural effusion is noted with probable underlying atelectasis. Bony thorax is unremarkable.   IMPRESSION: Small left pleural effusion with probable underlying atelectasis.     Electronically  Signed   By: Marijo Conception M.D.   On: 03/31/2021 13:36     Impression and Plan: He was seen by his cardiologist Dr. Terri Skains earlier today. He has chronic HFrEF and is on Entresto 49/51 mg bid. She has ordered labs (BMET, TSH, pro BNP, and H/H). He has a history of persistent a fib. His Amiodarone was decreased to 100 mg daily by Dr. Terri Skains. If he has continued fatigue, she may consider TEE/DCCV to try to restore normal sinus rhythm.   Regarding cellulitis of the RLE, he is almost finished Keflex and still has cellulitis. He has a history of diabetes (pre op HGA1C is 8.1.). I discussed with Dr. Kipp Brood, and we will admit him for IV antibiotics   Nani Skillern, PA-C [facility] [phone]

## 2021-03-31 NOTE — Addendum Note (Signed)
Addended by: Nani Skillern on: 03/31/2021 03:56 PM   Modules accepted: Orders, SmartSet

## 2021-03-31 NOTE — Telephone Encounter (Signed)
Patient's daughter contacted the office stating his leg with cellulitis looked much angrier and red this morning. Per daughter, patient got up to go to the restroom and she noticed his leg looked red with a bit of swelling. She states patient's antibiotics got changed at his last visit and his leg had been looking better. She denies fever, warmth, or pain at site. Moved patient's follow up from tomorrow with PA to today for assessment. Family aware.

## 2021-03-31 NOTE — H&P (Deleted)
  The note originally documented on this encounter has been moved the the encounter in which it belongs.  

## 2021-03-31 NOTE — Progress Notes (Signed)
Direct admission from home. Not in any form of distress.

## 2021-03-31 NOTE — Patient Instructions (Addendum)
1.You are encouraged to enroll and participate in the outpatient cardiac rehab program once cellulitis has resolved.  2. You may continue to gradually increase your physical activity as tolerated.  Refrain from any heavy lifting or strenuous use of your arms and shoulders until at least 8 weeks from the time of your surgery, and avoid activities that cause increased pain in your chest on the side of your surgical incision.  Otherwise, you may continue to increase activities without any particular limitations.  Increase the intensity and duration of physical activity gradually.

## 2021-03-31 NOTE — H&P (Signed)
[address]            [place] [address]          [phone]     Derek Bond is an 75 y.o. male. 01/28/1946 UEA:[id number]   Chief Complaint: Cellulitis of RLE  History of Presenting Illness:  This is a 75 year old male with a past medical history of diabetes mellitus, remote tobacco abuse, melanoma, hypertension, and OSA who underwent a CABG x 4, MAZE, LA clip by Dr. Kipp Brood on 02/26/2021. His PCP treated him with Amoxicillin for cellulitis of the RLE. He saw a physician assistant in our office last Monday. At that time, there was no drainage from the [facility] wound, only cellulitis. He was changed to Keflex and instructed to follow up on 11/16. His daughter called the office today as she thinks cellulitis looks worse and he has some swelling in that leg. Patient denies fever, chills, or drainage from RLE wound. He does have complaints of not much appetite and fells as though food tastes different since surgery.  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    Due to laceration at age 73    Hiatal hernia    History of melanoma    Obstructive sleep apnea    Type 2 diabetes mellitus ([place])     Past Surgical History: Past Surgical History:  Procedure Laterality Date   BIOPSY  12/21/2019   Procedure: BIOPSY;  Surgeon: Harvel Quale, MD;  [facility];  Service: Gastroenterology;;  random colon   CLIPPING OF ATRIAL APPENDAGE N/A 02/26/2021   Procedure: CLIPPING OF ATRIAL APPENDAGE USING ATRICURE CLIP SIZE 61;  Surgeon: Lajuana Matte, MD;  Location: [place];  Service: Open Heart Surgery;  Laterality: N/A;   COLONOSCOPY WITH PROPOFOL N/A 12/21/2019   Procedure: COLONOSCOPY WITH PROPOFOL;  Surgeon:  Harvel Quale, MD;  [facility];  Service: Gastroenterology;  Laterality: N/A;  915   CORONARY ARTERY BYPASS GRAFT N/A 02/26/2021   Procedure: CORONARY ARTERY BYPASS GRAFTING (CABG) X4, ON PUMP, USING LEFT INTERNAL MAMMARY ARTERY AND RIGHT ENDOSCOPICALLY HARVESTED GREATER SAPHENOUS VEIN;  Surgeon: Lajuana Matte, MD;  Location: [place];  Service: Open Heart Surgery;  Laterality: N/A;   ENDOVEIN HARVEST OF GREATER SAPHENOUS VEIN Right 02/26/2021   Procedure: ENDOVEIN HARVEST OF GREATER SAPHENOUS VEIN;  Surgeon: Lajuana Matte, MD;  Location: [place];  Service: Open Heart Surgery;  Laterality: Right;   MAZE N/A 02/26/2021   Procedure: MAZE;  Surgeon: Lajuana Matte, MD;  Location: [place];  Service: Open Heart Surgery;  Laterality: N/A;   MELANOMA EXCISION     Left facial region   REPLACEMENT TOTAL KNEE Left    RIGHT/LEFT HEART CATH AND CORONARY ANGIOGRAPHY N/A 02/23/2021   Procedure: RIGHT/LEFT HEART CATH AND CORONARY ANGIOGRAPHY;  Surgeon: Nigel Mormon, MD;  Location: [place] [facility];  Service: Cardiovascular;  Laterality: N/A;   TEE WITHOUT CARDIOVERSION N/A 02/26/2021   Procedure: TRANSESOPHAGEAL ECHOCARDIOGRAM (TEE);  Surgeon: Lajuana Matte, MD;  Location: [place];  Service: Open Heart Surgery;  Laterality: N/A;   UMBILICAL HERNIA  REPAIR      Family History: Family History  Problem Relation Age of Onset   Diabetes Mother        Died in a house fire   Lung cancer Father    Hypertension Sister    Hypertension Sister    Hypertension Brother    Diabetes Brother    Hypertension Brother    Hypertension Other    Diabetes Other    Rheum arthritis Other    Stroke Other      Social History:   reports that he quit smoking about 31 years ago. His smoking use included cigarettes. He has a 60.00 pack-year smoking history. He has never used smokeless tobacco. He reports that he does not currently use alcohol. He reports that he does not use  drugs.  Allergies:  No Known Allergies   ROS:non contributory except those stated in HPI  BP 107/71    Pulse 100    Resp 20    Ht _0  (1.981 m)    Wt 193 lb (87.5 kg)    SpO2 95% Comment: RA   BMI 22.30 kg/m   Physical Exam: General appearance: cooperative and no distress Neurologic: intact Heart: IRRR IRRR Lungs: Slightly diminished at left base;right lung is clear Abdomen: Soft, non tender, bowel sounds present Extremities: Cellulitis RLE, LLE no erythema or edema Wounds: Sternal and right thigh wounds are clean, dry, and well healed   Diagnostic Studies and Laboratory Results: Results for orders placed or performed in visit on 03/24/21 (from the past 48 hour(s))  Pro b natriuretic peptide (BNP)     Status: Abnormal   Collection Time: 03/30/21  9:57 AM  Result Value Ref Range   NT-Pro BNP 1,722 (H) 0 - 486 pg/mL    Comment: The following cut-points have been suggested for the use of proBNP for the diagnostic evaluation of heart failure (HF) in patients with acute dyspnea: Modality                     Age           Optimal Cut                            (years)            Point ------------------------------------------------------ Diagnosis (rule in HF)        <50            450 pg/mL                           50 - 75            900 pg/mL                               >75           1800 pg/mL Exclusion (rule out HF)  Age independent     300 pg/mL   Magnesium     Status: None   Collection Time: 03/30/21  9:59 AM  Result Value Ref Range   Magnesium 1.7 1.6 - 2.3 mg/dL  Basic metabolic panel     Status: Abnormal   Collection Time: 03/30/21 10:05 AM  Result Value Ref Range   Glucose 160 (H) 70 - 99 mg/dL   BUN 20 8 - 27 mg/dL   Creatinine, Ser  0.97 0.76 - 1.27 mg/dL   eGFR 81 >59 mL/min/1.73   BUN/Creatinine Ratio 21 10 - 24   Sodium 134 134 - 144 mmol/L   Potassium 4.9 3.5 - 5.2 mmol/L   Chloride 96 96 - 106 mmol/L   CO2 22 20 - 29 mmol/L   Calcium 9.8 8.6 -  10.2 mg/dL   DG Chest 2 View  Result Date: 03/31/2021 CLINICAL DATA:  Status post coronary bypass graft. EXAM: CHEST - 2 VIEW COMPARISON:  March 01, 2021. FINDINGS: Stable cardiomediastinal silhouette. Right lung is clear. Small left pleural effusion is noted with probable underlying atelectasis. Bony thorax is unremarkable. IMPRESSION: Small left pleural effusion with probable underlying atelectasis. Electronically Signed   By: Marijo Conception M.D.   On: 03/31/2021 13:36     Assessment/Plan Direct admission to [facility]  Cellulitis RLE from [facility] (s/p CABG x 4 on 02/26/2021)-will put on IV Vancomycin. Will check Duplex US to rule out DVT. History of hypertension-on Coreg 3.125 mg bid History of chronic atrial fibrillation-on Amiodarone 100 mg daily and Apixaban 5 mg bid History of diabetes mellitus-last HGA1C 8.1. On on Farxiga 10 mg daily, Glipizide 10 mg bid and Metformin 1000 mg bid 5. History of chronic HFrEF- and Entresto 49/51 mg bid 6. History of hyperlipidemia-on Crestor 5 mg at hs  Nani Skillern, PA-C 03/31/2021, 3:21 PM

## 2021-04-01 ENCOUNTER — Inpatient Hospital Stay (HOSPITAL_COMMUNITY): Payer: Medicare PPO

## 2021-04-01 ENCOUNTER — Encounter (HOSPITAL_COMMUNITY): Payer: Medicare PPO

## 2021-04-01 ENCOUNTER — Other Ambulatory Visit: Payer: Self-pay

## 2021-04-01 DIAGNOSIS — I5022 Chronic systolic (congestive) heart failure: Secondary | ICD-10-CM

## 2021-04-01 DIAGNOSIS — Z79899 Other long term (current) drug therapy: Secondary | ICD-10-CM

## 2021-04-01 DIAGNOSIS — Z7901 Long term (current) use of anticoagulants: Secondary | ICD-10-CM

## 2021-04-01 LAB — GLUCOSE, CAPILLARY
Glucose-Capillary: 122 mg/dL — ABNORMAL HIGH (ref 70–99)
Glucose-Capillary: 165 mg/dL — ABNORMAL HIGH (ref 70–99)
Glucose-Capillary: 196 mg/dL — ABNORMAL HIGH (ref 70–99)
Glucose-Capillary: 89 mg/dL (ref 70–99)

## 2021-04-01 MED ORDER — GERHARDT'S BUTT CREAM
TOPICAL_CREAM | Freq: Three times a day (TID) | CUTANEOUS | Status: DC
  Filled 2021-04-01: qty 1

## 2021-04-01 MED ORDER — ENSURE ENLIVE PO LIQD
237.0000 mL | Freq: Two times a day (BID) | ORAL | Status: DC
  Administered 2021-04-01 – 2021-04-02 (×2): 237 mL via ORAL

## 2021-04-01 MED ORDER — FUROSEMIDE 40 MG PO TABS: 40.0000 mg | ORAL_TABLET | Freq: Every day | ORAL | Status: DC

## 2021-04-01 NOTE — Progress Notes (Signed)
Mobility Specialist Progress Note    04/01/21 1801  Mobility  Activity Ambulated in hall  Level of Assistance Modified independent, requires aide device or extra time  [place] wheel walker  Distance Ambulated (ft) 260 ft  Mobility Ambulated independently in hallway  Mobility Response Tolerated well  Mobility performed by Mobility specialist  $Mobility charge 1 Mobility   Pt received in chair and agreeable. No complaints on walk. Returned to bed with call bell in reach.   Hildred Alamin Mobility Specialist  Mobility Specialist Phone: [phone]

## 2021-04-01 NOTE — TOC Progression Note (Signed)
Transition of Care [facility]) - Progression Note    Patient Details  Name: Derek Bond MRN: [id number] Date of Birth: 08-Apr-1946  Transition of Care [facility]) CM/SW Contact  Zenon Mayo, RN Phone Number: 04/01/2021, 6:12 PM  Clinical Narrative:    from home, LE cellulits, on iv abx, will transition to po abx in am. Likely dc tomorrow. Patient has a walker and 3 n 1 at home. He states he does not need any HH needs or any other DME.  TOC will continue to follow for dc needs.        Expected Discharge Plan and Services                                                 Social Determinants of Health (SDOH) Interventions    Readmission Risk Interventions Readmission Risk Prevention Plan 03/04/2021  Transportation Screening Complete  PCP or Specialist Appt within 5-7 Days Complete  Home Care Screening Complete  Medication Review (RN CM) Complete  Some recent data might be hidden

## 2021-04-01 NOTE — Plan of Care (Signed)
  Problem: Education: Goal: Knowledge of General Education information will improve Description: Including pain rating scale, medication(s)/side effects and non-pharmacologic comfort measures Outcome: Progressing   Problem: Clinical Measurements: Goal: Ability to maintain clinical measurements within normal limits will improve Outcome: Progressing Goal: Diagnostic test results will improve Outcome: Progressing Goal: Respiratory complications will improve Outcome: Progressing Goal: Cardiovascular complication will be avoided Outcome: Progressing   Problem: Activity: Goal: Risk for activity intolerance will decrease Outcome: Progressing   Problem: Coping: Goal: Level of anxiety will decrease Outcome: Progressing   Problem: Elimination: Goal: Will not experience complications related to bowel motility Outcome: Progressing

## 2021-04-01 NOTE — Hospital Course (Signed)
Hospital Course: Patient was placed on IV Vancomycin. RLE cellulitis continued to improve. There was less erythema and no drainage.

## 2021-04-01 NOTE — Progress Notes (Addendum)
      [address]       [place] [address]             [phone]            Subjective: Patient state RLE not that painful tis am  Objective: Vital signs in last 24 hours: Temp:  [97.5 F (36.4 C)-98.3 F (36.8 C)] 97.6 F (36.4 C) (11/16 0300) Pulse Rate:  [86-100] 90 (11/16 0300) Cardiac Rhythm: Normal sinus rhythm;Sinus tachycardia (11/15 2000) Resp:  [16-20] 20 (11/16 0300) BP: (96-126)/(67-88) 96/67 (11/16 0300) SpO2:  [94 %-96 %] 94 % (11/16 0300) Weight:  [87.5 kg-87.9 kg] 87.5 kg (11/15 1324)   Current Weight  03/31/21 87.5 kg      Intake/Output from previous day: 11/15 0701 - 11/16 0700 In: 350 [IV Piggyback:350] Out: 300 [Urine:300]   Physical Exam:  Cardiovascular: Slightly tachy (PAF history) Pulmonary: Clear to auscultation on the right and slightly diminished left basilar breath sounds Abdomen: Soft, non tender, bowel sounds present. Extremities: Nor lower extremity edema. Cellulitis (erythema decreasing) RLE. Small hematoma tunnel below right knee Wounds: Sternal wound is clean and dry.  No erythema or signs of infection. Right thigh wound is clean and dry  Lab Results: CBC: Recent Labs    03/31/21 2048  WBC 5.0  HGB 13.6  HCT 41.3  PLT 292   BMET:  Recent Labs    03/30/21 1005 03/31/21 2048  NA 134 134*  K 4.9 4.6  CL 96 100  CO2 22 23  GLUCOSE 160* 185*  BUN 20 21  CREATININE 0.97 1.04  CALCIUM 9.8 9.4    PT/INR:  Lab Results  Component Value Date   INR 1.5 (H) 02/26/2021   INR 1.3 (H) 02/26/2021   ABG:  INR: Will add last result for INR, ABG once components are confirmed Will add last 4 CBG results once components are confirmed  Assessment/Plan:  1. CV - History of chronic a fib. HR in low 100's but not a fib this am. On Coreg 3.125 mg bid, Amiodarone 100 mg daily, and Apixaban 2.  Pulmonary - On room air. CXR this am appears to show small left pleural effusion.  As discussed with Dr.  Kipp Brood, no need for thoracentesis  3.  History of chronic HFrEF- BNP upon admission 427.7. As discussed with Dr. Kipp Brood,  with labile BP, no Lasix 5. DM-CBGs 165. On Farxiga, Metformin, Glipizide. HGA1C prior to surger y8.1 6. ID-on Vancomycin for RLE cellulitis 7. Likely home in am. Will transition to oral keflex  Donielle M ZimmermanPA-C 04/01/2021,6:51 AM    Agree with above 1 more day of IV Abx Home tomorrow on oral abx  Dwayne Bulkley O Simara Rhyner

## 2021-04-01 NOTE — Discharge Summary (Signed)
Physician Discharge Summary       [address]       [place] [address] [place] [address]             [phone]    Patient ID: Derek Bond, Derek Bond MRN: [id number] DOB/AGE: 01-01-1946 75 y.o.  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 Kipp Brood on 02/26/2021. His PCP treated him with Amoxicillin for cellulitis of the RLE. He saw a physician assistant in our office last Monday. At that time, there was no drainage from the [facility] wound, only cellulitis. He was changed to Keflex and instructed to follow up on 11/16. His daughter called the office today as she thinks cellulitis looks worse and he has some swelling in that leg. Patient denies fever, chills, or drainage from RLE wound. He does have complaints of not much appetite and fells as though food tastes different since surgery. He was admitted for IV Vancomyicn.  Brief Bond Course:  Patient was placed on IV Vancomycin. RLE cellulitis continued to improve. There was less erythema and no drainage.  Patient with a history of a fib.  He was on Coreg 3.125 mg bid, Amiodarone 100 mg daily, and Apixaban. His appetite was slowly improving. His glucose was well controlled on Farxiga, Metformin and Glipizide. As discussed with Dr. Kipp Brood, he was transitioned to oral Keflex and felt stable for discharge on 04/02/2021.   Latest Vital  Signs: Blood pressure 111/79, pulse (!) 111, temperature 97.6 F (36.4 C), temperature source Oral, resp. rate 16, height 6\' 6"  (1.981 m), SpO2 95 %.  Physical Exam: Cardiovascular: IRRR IRRR Pulmonary: Clear to auscultation on the right and slightly diminished left basilar breath sounds Abdomen: Soft, non tender, bowel sounds present. Extremities: No lower extremity edema. Mild cellulitis (erythema decreasing) RLE. Small hematoma tunnel below right knee Wounds: Sternal wound is clean and dry.  No erythema or signs of infection. Right thigh wound is clean and dry   Discharge Condition:Stable and discharged to home.  Recent laboratory studies:  Lab Results  Component Value Date   WBC 5.0 03/31/2021   HGB 13.6 03/31/2021   HCT 41.3 03/31/2021   MCV 90.0 03/31/2021   PLT 292 03/31/2021   Lab Results  Component Value Date   NA 134 (L) 03/31/2021   K 4.6 03/31/2021   CL 100 03/31/2021   CO2 23 03/31/2021   CREATININE 1.04 03/31/2021   GLUCOSE 185 (H) 03/31/2021      Diagnostic Studies: DG Chest 2 View  Result Date: 04/01/2021 CLINICAL DATA:  Difficulty breathing, pleural effusion EXAM: CHEST - 2 VIEW COMPARISON:  Previous studies including the examination of 03/31/2021 FINDINGS: Heart is enlarged in size. There is a clamp in the region of left atrial appendage. There  is evidence of previous coronary bypass surgery. Increased density in the left lower lung fields has not changed significantly. There are no signs of pulmonary edema. There is no pneumothorax. IMPRESSION: Increased density in the left lower lung fields suggests left pleural effusion and possibly underlying infiltrate with no significant interval change. Electronically Signed   By: Elmer Picker M.D.   On: 04/01/2021 08:53   DG Chest 2 View  Result Date: 03/31/2021 CLINICAL DATA:  Status post coronary bypass graft. EXAM: CHEST - 2 VIEW COMPARISON:  March 01, 2021. FINDINGS: Stable cardiomediastinal silhouette.  Right lung is clear. Small left pleural effusion is noted with probable underlying atelectasis. Bony thorax is unremarkable. IMPRESSION: Small left pleural effusion with probable underlying atelectasis. Electronically Signed   By: Marijo Conception M.D.   On: 03/31/2021 13:36      Discharge Medications: Allergies as of 04/02/2021   No Known Allergies      Medication List     TAKE these medications    acetaminophen 500 MG tablet Commonly known as: TYLENOL Take 1,000 mg by mouth every 6 (six) hours as needed for moderate pain or mild pain.   ALPRAZolam 0.5 MG tablet Commonly known as: XANAX Take 0.5 mg by mouth at bedtime as needed for anxiety.   amiodarone 100 MG tablet Commonly known as: PACERONE Take 1 tablet (100 mg total) by mouth daily.   apixaban 5 MG Tabs tablet Commonly known as: ELIQUIS Take 1 tablet (5 mg total) by mouth 2 (two) times daily.   aspirin EC 81 MG tablet Take 81 mg by mouth daily.   barrier cream Crea Commonly known as: non-specified Apply 1 application topically 2 (two) times daily as needed (For bed sores).   cephALEXin 500 MG capsule Commonly known as: KEFLEX Take 1 capsule (500 mg total) by mouth 3 (three) times daily for 16 days.   dapagliflozin propanediol 10 MG Tabs tablet Commonly known as: Farxiga Take 1 tablet (10 mg total) by mouth daily before breakfast.   feeding supplement Liqd Take 237 mLs by mouth 2 (two) times daily between meals. Until oral intake improves   glipiZIDE 10 MG 24 hr tablet Commonly known as: GLUCOTROL XL Take 10 mg by mouth 2 (two) times daily.   Magnesium Oxide 400 MG Caps Take 1 capsule (400 mg total) by mouth in the morning and at bedtime. What changed: when to take this   metFORMIN 1000 MG tablet Commonly known as: GLUCOPHAGE Take 1,000 mg by mouth 2 (two) times daily with a meal.   pantoprazole 40 MG tablet Commonly known as: PROTONIX Take 40 mg by mouth daily.   rosuvastatin 5 MG  tablet Commonly known as: CRESTOR Take 5 mg by mouth at bedtime.   sacubitril-valsartan 49-51 MG Commonly known as: ENTRESTO Take 1 tablet by mouth 2 (two) times daily.   traMADol 50 MG tablet Commonly known as: ULTRAM Take 1 tablet (50 mg total) by mouth every 4 (four) hours as needed for moderate pain.        Follow Up Appointments:  Follow-up Information     Lajuana Matte, MD Follow up on 04/17/2021.   Specialty: Cardiothoracic Surgery Why: Appointment is via TELEPHONE. Please do NOT go to the office. Appointment time is at 10:10 am Contact information: [address] [place] [address] [phone]                 Signed: Sharalyn Ink [facility] 04/03/2021, 10:57 AM

## 2021-04-01 NOTE — Discharge Instructions (Addendum)
Patient may participate in cardiac rehab  Information on my medicine - ELIQUIS (apixaban)  This medication education was reviewed with me or my healthcare representative as part of my discharge preparation.  The pharmacist that spoke with me during my hospital stay was:    Why was Eliquis prescribed for you? Eliquis was prescribed for you to reduce the risk of a blood clot forming that can cause a stroke if you have a medical condition called atrial fibrillation (a type of irregular heartbeat).  What do You need to know about Eliquis ? Take your Eliquis TWICE DAILY - one tablet in the morning and one tablet in the evening with or without food. If you have difficulty swallowing the tablet whole please discuss with your pharmacist how to take the medication safely.  Take Eliquis exactly as prescribed by your doctor and DO NOT stop taking Eliquis without talking to the doctor who prescribed the medication.  Stopping may increase your risk of developing a stroke.  Refill your prescription before you run out.  After discharge, you should have regular check-up appointments with your healthcare provider that is prescribing your Eliquis.  In the future your dose may need to be changed if your kidney function or weight changes by a significant amount or as you get older.  What do you do if you miss a dose? If you miss a dose, take it as soon as you remember on the same day and resume taking twice daily.  Do not take more than one dose of ELIQUIS at the same time to make up a missed dose.  Important Safety Information A possible side effect of Eliquis is bleeding. You should call your healthcare provider right away if you experience any of the following: Bleeding from an injury or your nose that does not stop. Unusual colored urine (red or dark brown) or unusual colored stools (red or black). Unusual bruising for unknown reasons. A serious fall or if you hit your head (even if there is no  bleeding).  Some medicines may interact with Eliquis and might increase your risk of bleeding or clotting while on Eliquis. To help avoid this, consult your healthcare provider or pharmacist prior to using any new prescription or non-prescription medications, including herbals, vitamins, non-steroidal anti-inflammatory drugs (NSAIDs) and supplements.  This website has more information on Eliquis (apixaban): http://www.eliquis.com/eliquis/home

## 2021-04-02 LAB — GLUCOSE, CAPILLARY
Glucose-Capillary: 117 mg/dL — ABNORMAL HIGH (ref 70–99)
Glucose-Capillary: 167 mg/dL — ABNORMAL HIGH (ref 70–99)

## 2021-04-02 MED ORDER — ENSURE ENLIVE PO LIQD: 237.0000 mL | Freq: Two times a day (BID) | ORAL | 12 refills | Status: DC

## 2021-04-02 MED ORDER — CEPHALEXIN 500 MG PO CAPS: 500.0000 mg | ORAL_CAPSULE | Freq: Three times a day (TID) | ORAL | 0 refills | Status: AC

## 2021-04-02 NOTE — Progress Notes (Addendum)
      Derek Bond [address]       [place], [address]             [phone]            Subjective: Patient eating breakfast. He has no complaints;feeling better.  Objective: Vital signs in last 24 hours: Temp:  [97.4 F (36.3 C)-98.3 F (36.8 C)] 97.4 F (36.3 C) (11/17 0437) Pulse Rate:  [87-106] 100 (11/17 0437) Cardiac Rhythm: Sinus tachycardia;Bundle branch block (11/16 1904) Resp:  [16-18] 17 (11/17 0437) BP: (91-115)/(66-81) 115/72 (11/17 0437) SpO2:  [94 %-97 %] 94 % (11/17 0437)   Current Weight  03/31/21 87.5 kg      Intake/Output from previous day: 11/16 0701 - 11/17 0700 In: 600 [P.O.:200; IV Piggyback:400] Out: 600 [Urine:600]   Physical Exam:  Cardiovascular: IRRR IRRR Pulmonary: Clear to auscultation on the right and slightly diminished left basilar breath sounds Abdomen: Soft, non tender, bowel sounds present. Extremities: No lower extremity edema. Mild cellulitis (erythema decreasing) RLE. Small hematoma tunnel below right knee Wounds: Sternal wound is clean and dry.  No erythema or signs of infection. Right thigh wound is clean and dry  Lab Results: CBC: Recent Labs    03/31/21 2048  WBC 5.0  HGB 13.6  HCT 41.3  PLT 292    BMET:  Recent Labs    03/30/21 1005 03/31/21 2048  NA 134 134*  K 4.9 4.6  CL 96 100  CO2 22 23  GLUCOSE 160* 185*  BUN 20 21  CREATININE 0.97 1.04  CALCIUM 9.8 9.4     PT/INR:  Lab Results  Component Value Date   INR 1.5 (H) 02/26/2021   INR 1.3 (H) 02/26/2021   ABG:  INR: Will add last result for INR, ABG once components are confirmed Will add last 4 CBG results once components are confirmed  Assessment/Plan:  1. CV - History of chronic a fib. HR in low 100's but not a fib this am. On Coreg 3.125 mg bid, Amiodarone 100 mg daily, and Apixaban 2.  Pulmonary - On room air.  3.  History of chronic HFrEF- BNP upon admission 427.7. As discussed with Dr. Kipp Brood,  with labile BP, no  Lasix 5. DM-CBGs 196/122/117. On Farxiga, Metformin, Glipizide. HGA1C prior to surgery 8.1 6. ID-on Vancomycin for RLE cellulitis. As discussed with Dr. Kipp Brood, will transition to Keflex 7. As discussed with Dr. Kipp Brood, stable for discharge  Ethylene Reznick M ZimmermanPA-C 04/02/2021,6:57 AM

## 2021-04-02 NOTE — Progress Notes (Signed)
RN went over d/c summary with pt and pt's daughter. NT removed PIV. Belongings with pt's daughter. NT transporting pt to private vehicle where pt's daughter transporting pt home.

## 2021-04-02 NOTE — TOC Transition Note (Addendum)
Transition of Care [facility]) - CM/SW Discharge Note   Patient Details  Name: Derek Bond MRN: [id number] Date of Birth: 12-04-1945  Transition of Care [facility]) CM/SW Contact:  Zenon Mayo, RN Phone Number: 04/02/2021, 9:11 AM   Clinical Narrative:    Patient is for dc today, he has no needs per conversation with patient yesterday.  Per Staff RN patient will receive some more iv abx today before he leaves.    Final next level of care: Home/Self Care Barriers to Discharge: No Barriers Identified   Patient Goals and CMS Choice Patient states their goals for this hospitalization and ongoing recovery are:: return home      Discharge Placement                       Discharge Plan and Services                  DME Agency: NA                  Social Determinants of Health ([place]) Interventions     Readmission Risk Interventions Readmission Risk Prevention Plan 03/04/2021  Transportation Screening Complete  PCP or Specialist Appt within 5-7 Days Complete  Home Care Screening Complete  Medication Review (RN CM) Complete  Some recent data might be hidden

## 2021-04-02 NOTE — Plan of Care (Signed)

## 2021-04-03 ENCOUNTER — Telehealth: Payer: Self-pay

## 2021-04-03 ENCOUNTER — Other Ambulatory Visit: Payer: Self-pay

## 2021-04-03 MED ORDER — CARVEDILOL 3.125 MG PO TABS: 3.1250 mg | ORAL_TABLET | Freq: Two times a day (BID) | ORAL | 1 refills | Status: DC

## 2021-04-03 NOTE — Telephone Encounter (Signed)
Yes, you can refill Coreg.

## 2021-04-03 NOTE — Telephone Encounter (Signed)
Refill sent.

## 2021-04-03 NOTE — Telephone Encounter (Signed)
Pts wife called and wanted to know if we can take over the pts carvedilol. It was prescribed in the hospital and and they have no one to refill it. Please advise.

## 2021-04-05 ENCOUNTER — Other Ambulatory Visit: Payer: Self-pay | Admitting: Cardiology

## 2021-04-06 DIAGNOSIS — I10 Essential (primary) hypertension: Secondary | ICD-10-CM | POA: Diagnosis not present

## 2021-04-06 DIAGNOSIS — E7800 Pure hypercholesterolemia, unspecified: Secondary | ICD-10-CM | POA: Diagnosis not present

## 2021-04-06 DIAGNOSIS — E1165 Type 2 diabetes mellitus with hyperglycemia: Secondary | ICD-10-CM | POA: Diagnosis not present

## 2021-04-06 DIAGNOSIS — J069 Acute upper respiratory infection, unspecified: Secondary | ICD-10-CM | POA: Diagnosis not present

## 2021-04-06 DIAGNOSIS — Z299 Encounter for prophylactic measures, unspecified: Secondary | ICD-10-CM | POA: Diagnosis not present

## 2021-04-06 NOTE — Telephone Encounter (Signed)
Should we fill these?

## 2021-04-07 DIAGNOSIS — I110 Hypertensive heart disease with heart failure: Secondary | ICD-10-CM | POA: Diagnosis not present

## 2021-04-07 DIAGNOSIS — I4819 Other persistent atrial fibrillation: Secondary | ICD-10-CM | POA: Diagnosis not present

## 2021-04-07 DIAGNOSIS — I25110 Atherosclerotic heart disease of native coronary artery with unstable angina pectoris: Secondary | ICD-10-CM | POA: Diagnosis not present

## 2021-04-07 DIAGNOSIS — I5023 Acute on chronic systolic (congestive) heart failure: Secondary | ICD-10-CM | POA: Diagnosis not present

## 2021-04-07 NOTE — Telephone Encounter (Signed)
On 03/24/2021 he was asked to hold lasix and potassium. Please confirm.   As of the last office encounter he was not on them

## 2021-04-08 NOTE — Telephone Encounter (Signed)
Pt is not taking either

## 2021-04-08 NOTE — Telephone Encounter (Signed)
Let me know after you talk to him.  ST

## 2021-04-13 DIAGNOSIS — I10 Essential (primary) hypertension: Secondary | ICD-10-CM | POA: Diagnosis not present

## 2021-04-13 DIAGNOSIS — E039 Hypothyroidism, unspecified: Secondary | ICD-10-CM | POA: Diagnosis not present

## 2021-04-13 DIAGNOSIS — E1165 Type 2 diabetes mellitus with hyperglycemia: Secondary | ICD-10-CM | POA: Diagnosis not present

## 2021-04-13 DIAGNOSIS — L03119 Cellulitis of unspecified part of limb: Secondary | ICD-10-CM | POA: Diagnosis not present

## 2021-04-13 DIAGNOSIS — Z299 Encounter for prophylactic measures, unspecified: Secondary | ICD-10-CM | POA: Diagnosis not present

## 2021-04-13 DIAGNOSIS — E440 Moderate protein-calorie malnutrition: Secondary | ICD-10-CM | POA: Diagnosis not present

## 2021-04-14 ENCOUNTER — Emergency Department (HOSPITAL_COMMUNITY)
Admission: EM | Admit: 2021-04-14 | Discharge: 2021-04-15 | Disposition: A | Discharge status: Home / Self Care | Payer: Medicare PPO | Attending: Emergency Medicine | Admitting: Emergency Medicine

## 2021-04-14 ENCOUNTER — Encounter (HOSPITAL_COMMUNITY): Payer: Self-pay | Admitting: Emergency Medicine

## 2021-04-14 ENCOUNTER — Telehealth: Payer: Self-pay

## 2021-04-14 ENCOUNTER — Emergency Department (HOSPITAL_COMMUNITY): Payer: Medicare PPO

## 2021-04-14 ENCOUNTER — Other Ambulatory Visit: Payer: Self-pay

## 2021-04-14 DIAGNOSIS — I5023 Acute on chronic systolic (congestive) heart failure: Secondary | ICD-10-CM | POA: Diagnosis not present

## 2021-04-14 DIAGNOSIS — J9811 Atelectasis: Secondary | ICD-10-CM | POA: Diagnosis not present

## 2021-04-14 DIAGNOSIS — Z951 Presence of aortocoronary bypass graft: Secondary | ICD-10-CM | POA: Diagnosis not present

## 2021-04-14 DIAGNOSIS — Z87891 Personal history of nicotine dependence: Secondary | ICD-10-CM | POA: Diagnosis not present

## 2021-04-14 DIAGNOSIS — Z96652 Presence of left artificial knee joint: Secondary | ICD-10-CM | POA: Diagnosis not present

## 2021-04-14 DIAGNOSIS — I110 Hypertensive heart disease with heart failure: Secondary | ICD-10-CM | POA: Diagnosis not present

## 2021-04-14 DIAGNOSIS — I2510 Atherosclerotic heart disease of native coronary artery without angina pectoris: Secondary | ICD-10-CM | POA: Diagnosis not present

## 2021-04-14 DIAGNOSIS — R000 Tachycardia, unspecified: Secondary | ICD-10-CM | POA: Diagnosis not present

## 2021-04-14 DIAGNOSIS — Z7982 Long term (current) use of aspirin: Secondary | ICD-10-CM | POA: Diagnosis not present

## 2021-04-14 DIAGNOSIS — Z7984 Long term (current) use of oral hypoglycemic drugs: Secondary | ICD-10-CM | POA: Insufficient documentation

## 2021-04-14 DIAGNOSIS — E785 Hyperlipidemia, unspecified: Secondary | ICD-10-CM | POA: Diagnosis not present

## 2021-04-14 DIAGNOSIS — I429 Cardiomyopathy, unspecified: Secondary | ICD-10-CM | POA: Diagnosis not present

## 2021-04-14 DIAGNOSIS — R0789 Other chest pain: Secondary | ICD-10-CM | POA: Diagnosis not present

## 2021-04-14 DIAGNOSIS — E119 Type 2 diabetes mellitus without complications: Secondary | ICD-10-CM | POA: Diagnosis not present

## 2021-04-14 DIAGNOSIS — I509 Heart failure, unspecified: Secondary | ICD-10-CM | POA: Insufficient documentation

## 2021-04-14 DIAGNOSIS — I4819 Other persistent atrial fibrillation: Secondary | ICD-10-CM | POA: Diagnosis not present

## 2021-04-14 DIAGNOSIS — R079 Chest pain, unspecified: Secondary | ICD-10-CM | POA: Diagnosis not present

## 2021-04-14 DIAGNOSIS — Z7901 Long term (current) use of anticoagulants: Secondary | ICD-10-CM | POA: Diagnosis not present

## 2021-04-14 DIAGNOSIS — Z85820 Personal history of malignant melanoma of skin: Secondary | ICD-10-CM | POA: Insufficient documentation

## 2021-04-14 DIAGNOSIS — J90 Pleural effusion, not elsewhere classified: Secondary | ICD-10-CM | POA: Diagnosis not present

## 2021-04-14 DIAGNOSIS — L03115 Cellulitis of right lower limb: Secondary | ICD-10-CM | POA: Diagnosis not present

## 2021-04-14 DIAGNOSIS — Z79899 Other long term (current) drug therapy: Secondary | ICD-10-CM | POA: Diagnosis not present

## 2021-04-14 DIAGNOSIS — I25110 Atherosclerotic heart disease of native coronary artery with unstable angina pectoris: Secondary | ICD-10-CM | POA: Diagnosis not present

## 2021-04-14 DIAGNOSIS — Z48812 Encounter for surgical aftercare following surgery on the circulatory system: Secondary | ICD-10-CM | POA: Diagnosis not present

## 2021-04-14 DIAGNOSIS — R0602 Shortness of breath: Secondary | ICD-10-CM | POA: Insufficient documentation

## 2021-04-14 DIAGNOSIS — I447 Left bundle-branch block, unspecified: Secondary | ICD-10-CM | POA: Diagnosis not present

## 2021-04-14 LAB — CBC
HCT: 44.2 % (ref 39.0–52.0)
Hemoglobin: 14.1 g/dL (ref 13.0–17.0)
MCH: 28.7 pg (ref 26.0–34.0)
MCHC: 31.9 g/dL (ref 30.0–36.0)
MCV: 89.8 fL (ref 80.0–100.0)
Platelets: 378 10*3/uL (ref 150–400)
RBC: 4.92 MIL/uL (ref 4.22–5.81)
RDW: 15.6 % — ABNORMAL HIGH (ref 11.5–15.5)
WBC: 6.5 10*3/uL (ref 4.0–10.5)
nRBC: 0 % (ref 0.0–0.2)

## 2021-04-14 LAB — CBG MONITORING, ED
Glucose-Capillary: 131 mg/dL — ABNORMAL HIGH (ref 70–99)
Glucose-Capillary: 148 mg/dL — ABNORMAL HIGH (ref 70–99)

## 2021-04-14 LAB — BASIC METABOLIC PANEL
Anion gap: 11 (ref 5–15)
BUN: 12 mg/dL (ref 8–23)
CO2: 23 mmol/L (ref 22–32)
Calcium: 9.6 mg/dL (ref 8.9–10.3)
Chloride: 101 mmol/L (ref 98–111)
Creatinine, Ser: 0.85 mg/dL (ref 0.61–1.24)
GFR, Estimated: >60 mL/min (ref >60)
Glucose, Bld: 129 mg/dL — ABNORMAL HIGH (ref 70–99)
Potassium: 4.3 mmol/L (ref 3.5–5.1)
Sodium: 135 mmol/L (ref 135–145)

## 2021-04-14 LAB — TROPONIN I (HIGH SENSITIVITY): Troponin I (High Sensitivity): 45 ng/L — ABNORMAL HIGH (ref <18)

## 2021-04-14 NOTE — ED Notes (Signed)
Unable to obtain temp. Pt stated he had been drinking fluids because his mouth was dry.

## 2021-04-14 NOTE — ED Notes (Signed)
Pts daughter requested that blood sugar be checked.

## 2021-04-14 NOTE — ED Provider Notes (Signed)
[name] Provider Note   CSN: [id number] Arrival date & time: 04/14/21  1358     History Chief Complaint  Patient presents with   Chest Pain    Derek Bond is a 75 y.o. male.  Patient with PMH of afib on eliquis, CAD, recent CABG on 10/13, presents to the ED with a chief complaint of chest pain.  Reports associated nausea and some diaphoresis around lunch time today.  Was sent by home health nurse. Reports that he has been having the chest pain for the past 3 days.  Denies any SOB or radiating pain.  States that the pain stopped this afternoon sometime.  Patient took a nitro before arriving in the ED.  He has been compliant with his eliquis.  Was prescribed an antacid by his PCP which he says gave him some relief of the chest pain.  The history is provided by the patient. No language interpreter was used.      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   Coronary artery disease 02/26/2021   Malnutrition of moderate degree 02/26/2021   Ischemic cardiomyopathy    HFrEF (heart failure with reduced ejection fraction) ([place])    Acute on chronic HFrEF (heart failure with reduced ejection fraction) ([place])    Essential hypertension    Hypercholesterolemia    Type 2 diabetes mellitus with complication, with long-term current use of insulin ([facility])    Left bundle branch block    Atrial fibrillation with RVR ([place]) 02/21/2021   Unstable angina ([place]) 02/21/2021    Rectal bleeding 11/28/2019   Loose stools 11/28/2019    Past Surgical History:  Procedure Laterality Date   BIOPSY  12/21/2019   Procedure: BIOPSY;  Surgeon: Harvel Quale, MD;  [facility];  Service: Gastroenterology;;  random colon   CLIPPING OF ATRIAL APPENDAGE N/A 02/26/2021   Procedure: CLIPPING OF ATRIAL APPENDAGE USING ATRICURE CLIP SIZE 75;  Surgeon: Lajuana Matte, MD;  Location: [facility];  Service: Open Heart Surgery;  Laterality: N/A;   COLONOSCOPY WITH PROPOFOL N/A 12/21/2019   Procedure: COLONOSCOPY WITH PROPOFOL;  Surgeon: Harvel Quale, MD;  [facility];  Service: Gastroenterology;  Laterality: N/A;  915   CORONARY ARTERY BYPASS GRAFT N/A 02/26/2021   Procedure: CORONARY ARTERY BYPASS GRAFTING (CABG) X4, ON PUMP, USING LEFT INTERNAL MAMMARY ARTERY AND RIGHT ENDOSCOPICALLY HARVESTED GREATER SAPHENOUS VEIN;  Surgeon: Lajuana Matte, MD;  Location: [facility];  Service: Open Heart Surgery;  Laterality: N/A;   ENDOVEIN HARVEST OF GREATER SAPHENOUS VEIN Right 02/26/2021   Procedure: ENDOVEIN HARVEST OF GREATER SAPHENOUS VEIN;  Surgeon: Lajuana Matte, MD;  Location: [facility];  Service: Open Heart Surgery;  Laterality: Right;   MAZE N/A 02/26/2021   Procedure: MAZE;  Surgeon: Lajuana Matte, MD;  Location: [facility];  Service: Open Heart  Surgery;  Laterality: N/A;   MELANOMA EXCISION     Left facial region   REPLACEMENT TOTAL KNEE Left    RIGHT/LEFT HEART CATH AND CORONARY ANGIOGRAPHY N/A 02/23/2021   Procedure: RIGHT/LEFT HEART CATH AND CORONARY ANGIOGRAPHY;  Surgeon: Nigel Mormon, MD;  Location: [facility] CV LAB;  Service: Cardiovascular;  Laterality: N/A;   TEE WITHOUT CARDIOVERSION N/A 02/26/2021   Procedure: TRANSESOPHAGEAL ECHOCARDIOGRAM (TEE);  Surgeon: Lajuana Matte, MD;  Location: [facility];  Service: Open Heart Surgery;  Laterality: N/A;   UMBILICAL HERNIA REPAIR         Family History  Problem  Relation Age of Onset   Diabetes Mother        Died in a house fire   Lung cancer Father    Hypertension Sister    Hypertension Sister    Hypertension Brother    Diabetes Brother    Hypertension Brother    Hypertension Other    Diabetes Other    Rheum arthritis Other    Stroke Other     Social History   Tobacco Use   Smoking status: Former    Packs/day: 2.00    Years: 30.00    Pack years: 60.00    Types: Cigarettes    Quit date: 05/17/1989    Years since quitting: 31.9   Smokeless tobacco: Never  Vaping Use   Vaping Use: Never used  Substance Use Topics   Alcohol use: Not Currently   Drug use: No    Home Medications Prior to Admission medications   Medication Sig Start Date End Date Taking? Authorizing Provider  acetaminophen (TYLENOL) 500 MG tablet Take 1,000 mg by mouth every 6 (six) hours as needed for moderate pain or mild pain.    [provider]  ALPRAZolam Duanne Moron) 0.5 MG tablet Take 0.5 mg by mouth at bedtime as needed for anxiety.    [provider]  amiodarone (PACERONE) 100 MG tablet Take 1 tablet (100 mg total) by mouth daily. 03/31/21   Tolia, Sunit, DO  apixaban (ELIQUIS) 5 MG TABS tablet Take 1 tablet (5 mg total) by mouth 2 (two) times daily. 02/02/21 05/03/21  Tolia, Sunit, DO  aspirin EC 81 MG tablet Take 81 mg by mouth daily.    [provider]  barrier cream (NON-SPECIFIED) CREA Apply 1 application topically 2 (two) times daily as needed (For bed sores).    [provider]  carvedilol (COREG) 3.125 MG tablet Take 1 tablet (3.125 mg total) by mouth 2 (two) times daily with a meal. 04/03/21   Tolia, Sunit, DO  cephALEXin (KEFLEX) 500 MG capsule Take 1 capsule (500 mg total) by mouth 3 (three) times daily for 16 days. 04/02/21 04/18/21  Nani Skillern, PA-C  dapagliflozin propanediol (FARXIGA) 10 MG TABS tablet Take 1 tablet (10 mg total) by mouth daily before breakfast. 02/11/21 05/12/21  Tolia, Sunit, DO  feeding  supplement (ENSURE ENLIVE / ENSURE PLUS) LIQD Take 237 mLs by mouth 2 (two) times daily between meals. Until oral intake improves 04/02/21   Lars Pinks M, PA-C  glipiZIDE (GLUCOTROL XL) 10 MG 24 hr tablet Take 10 mg by mouth 2 (two) times daily.    [provider]  Magnesium Oxide 400 MG CAPS Take 1 capsule (400 mg total) by mouth in the morning and at bedtime. Patient taking differently: Take 400 mg by mouth 2 (two) times daily. 01/30/21 04/30/21  Tolia, Sunit, DO  metFORMIN (GLUCOPHAGE) 1000 MG tablet Take 1,000 mg  by mouth 2 (two) times daily with a meal.    [provider]  pantoprazole (PROTONIX) 40 MG tablet Take 40 mg by mouth daily.    [provider]  rosuvastatin (CRESTOR) 5 MG tablet Take 5 mg by mouth at bedtime.    [provider]  sacubitril-valsartan (ENTRESTO) 49-51 MG Take 1 tablet by mouth 2 (two) times daily.    [provider]  traMADol (ULTRAM) 50 MG tablet Take 1 tablet (50 mg total) by mouth every 4 (four) hours as needed for moderate pain. 03/04/21   Elgie Collard, PA-C    Allergies    Patient has no known allergies.  Review of Systems   Review of Systems  All other systems reviewed and are negative.  Physical Exam Updated Vital Signs BP 121/90 (BP Location: Right Arm)   Pulse (!) 116   Temp 98 F (36.7 C) (Oral)   Resp 20   SpO2 100%   Physical Exam Vitals and nursing note reviewed.  Constitutional:      General: He is not in acute distress.    Appearance: He is well-developed.  HENT:     Head: Normocephalic and atraumatic.  Eyes:     Conjunctiva/sclera: Conjunctivae normal.  Cardiovascular:     Rate and Rhythm: Tachycardia present. Rhythm irregular.     Heart sounds: No murmur heard. Pulmonary:     Effort: Pulmonary effort is normal. No respiratory distress.     Breath sounds: Normal breath sounds.  Abdominal:     Palpations: Abdomen is soft.     Tenderness: There is no abdominal tenderness.   Musculoskeletal:        General: No swelling. Normal range of motion.     Cervical back: Neck supple.  Skin:    General: Skin is warm and dry.     Capillary Refill: Capillary refill takes less than 2 seconds.  Neurological:     Mental Status: He is alert.  Psychiatric:        Mood and Affect: Mood normal.    ED Results / Procedures / Treatments   Labs (all labs ordered are listed, but only abnormal results are displayed) Labs Reviewed  BASIC METABOLIC PANEL - Abnormal; Notable for the following components:      Result Value   Glucose, Bld 129 (*)    All other components within normal limits  CBC - Abnormal; Notable for the following components:   RDW 15.6 (*)    All other components within normal limits  CBG MONITORING, ED - Abnormal; Notable for the following components:   Glucose-Capillary 131 (*)    All other components within normal limits  CBG MONITORING, ED - Abnormal; Notable for the following components:   Glucose-Capillary 148 (*)    All other components within normal limits  TROPONIN I (HIGH SENSITIVITY) - Abnormal; Notable for the following components:   Troponin I (High Sensitivity) 45 (*)    All other components within normal limits  BRAIN NATRIURETIC PEPTIDE  TROPONIN I (HIGH SENSITIVITY)  TROPONIN I (HIGH SENSITIVITY)    EKG EKG Interpretation  Date/Time:  Tuesday April 14 2021 14:05:15 EST Ventricular Rate:  119 PR Interval:  144 QRS Duration: 136 QT Interval:  360 QTC Calculation: 506 R Axis:   135 Text Interpretation: Sinus tachycardia Right axis deviation Non-specific intra-ventricular conduction block Cannot rule out Anteroseptal infarct , age undetermined T wave abnormality, consider inferolateral ischemia Abnormal ECG When compared to prior, different appearance of QRS. no STEMI  Confirmed by Antony Blackbird [phone]) on 04/14/2021 9:41:36 PM  Radiology DG Chest 2 View  Result Date: 04/14/2021 CLINICAL DATA:  Chest pain. Indigestion. 2-3  days duration. Shortness of breath. EXAM: CHEST - 2 VIEW COMPARISON:  04/01/2021 FINDINGS: Previous median sternotomy, CABG and atrial clip. Aortic atherosclerotic calcification is noted. The right chest is clear. There is reduction in size of the previously seen left effusion with less atelectasis within the left lower lobe. Upper lungs remain clear. IMPRESSION: Radiographic improvement with reduction in size of the left effusion and better aeration of the left lower lobe. Electronically Signed   By: Nelson Chimes M.D.   On: 04/14/2021 14:48    Procedures Procedures   Medications Ordered in ED Medications - No data to display  ED Course  I have reviewed the triage vital signs and the nursing notes.  Pertinent labs & imaging results that were available during my care of the patient were reviewed by me and considered in my medical decision making (see chart for details).    MDM Rules/Calculators/A&P                            Patient here with chest pain over the past 3 days.  Had recent CABG.  Has history of A. fib on Eliquis.  Patient states that chest pain is stopped this afternoon while he was in the waiting room.  Initial troponin is mildly elevated at 45.  Will check repeat.  Will check BNP.  Patient does have some mild pitting edema in his bilateral lower extremities.  He is in no acute distress.  Does not appear uncomfortable.  Heart rates have been in the low 100s.  He has been moving in and out of A. fib.  Repeat troponin is 28.  Chest x-ray is improved from recent with regards to pleural effusion.  BNP is somewhat elevated.  Patient seen by and discussed with Dr. Leonette Monarch, who recommends cardiology consult.    I discussed the case with Dr. Terri Skains, from cardiology, who recommends changing the following medications. entresto 97-103, toprol XL 25 hold HR<60 or SBP<100, continue farxiga, stop carvediolol  Recommends discharge and outpatient follow-up.  I discussed the plan with  the patient, who is agreeable.     Final Clinical Impression(s) / ED Diagnoses Final diagnoses:  Chest pain, unspecified type    Rx / DC Orders ED Discharge Orders          Ordered    metoprolol succinate (TOPROL-XL) 25 MG 24 hr tablet  Daily        04/15/21 0343    sacubitril-valsartan (ENTRESTO) 97-103 MG  2 times daily        04/15/21 0343             Montine Circle, PA-C 04/15/21 0426    Leonette Monarch Grayce Sessions, MD 04/15/21 [phone]

## 2021-04-14 NOTE — ED Triage Notes (Signed)
CABG 10/13  States home health nurse concerned about HR 119.  Reports "indigestion" x 2-3 days.  Seen by PCP for same and took antiacid last night with some relief.  Denies SOB.  Reports mild nausea and diaphoresis today before lunch.

## 2021-04-15 ENCOUNTER — Ambulatory Visit: Payer: Medicare PPO | Admitting: Nutrition

## 2021-04-15 DIAGNOSIS — I10 Essential (primary) hypertension: Secondary | ICD-10-CM | POA: Diagnosis not present

## 2021-04-15 DIAGNOSIS — E1165 Type 2 diabetes mellitus with hyperglycemia: Secondary | ICD-10-CM | POA: Diagnosis not present

## 2021-04-15 LAB — TSH: TSH: 4.161 u[IU]/mL (ref 0.350–4.500)

## 2021-04-15 LAB — BRAIN NATRIURETIC PEPTIDE: B Natriuretic Peptide: 573.6 pg/mL — ABNORMAL HIGH (ref 0.0–100.0)

## 2021-04-15 LAB — TROPONIN I (HIGH SENSITIVITY)
Troponin I (High Sensitivity): 28 ng/L — ABNORMAL HIGH (ref <18)
Troponin I (High Sensitivity): 33 ng/L — ABNORMAL HIGH (ref <18)

## 2021-04-15 MED ORDER — METOPROLOL SUCCINATE ER 25 MG PO TB24: 25.0000 mg | ORAL_TABLET | Freq: Every day | ORAL | 0 refills | Status: DC

## 2021-04-15 MED ORDER — ENTRESTO 97-103 MG PO TABS: 1.0000 | ORAL_TABLET | Freq: Two times a day (BID) | ORAL | 0 refills | Status: DC

## 2021-04-15 NOTE — ED Notes (Signed)
Pt discharged and wheeled out of the ED in a wheel chair without difficulty. 

## 2021-04-15 NOTE — Discharge Instructions (Addendum)
We've made the following changes to your medications per Dr. Brennan Bailey recommendations.  Discontinue your current Entresto, and begin taking the new higher dose Entresto.  Discontinue carvedilol.  Start Toprol 25 mg XL (hold for heart rate less than 60 or systolic blood pressure less than 100)  Continue taking the [place] and Eliquis.  Please call your cardiologist office to make an appointment.

## 2021-04-16 ENCOUNTER — Encounter: Payer: Self-pay | Admitting: Cardiology

## 2021-04-16 NOTE — Telephone Encounter (Signed)
From pt

## 2021-04-16 NOTE — Telephone Encounter (Signed)
Spoke to the ED provider that night.

## 2021-04-16 NOTE — Telephone Encounter (Signed)
Wait about a week and can get them if he is feeling better. I would recommend one at a time given his history/condition.   ST

## 2021-04-17 ENCOUNTER — Ambulatory Visit (INDEPENDENT_AMBULATORY_CARE_PROVIDER_SITE_OTHER): Payer: Self-pay | Admitting: Thoracic Surgery (Cardiothoracic Vascular Surgery)

## 2021-04-17 ENCOUNTER — Telehealth: Payer: Medicare PPO | Admitting: Thoracic Surgery (Cardiothoracic Vascular Surgery)

## 2021-04-17 ENCOUNTER — Other Ambulatory Visit: Payer: Self-pay

## 2021-04-17 DIAGNOSIS — Z48812 Encounter for surgical aftercare following surgery on the circulatory system: Secondary | ICD-10-CM | POA: Diagnosis not present

## 2021-04-17 DIAGNOSIS — Z951 Presence of aortocoronary bypass graft: Secondary | ICD-10-CM

## 2021-04-17 DIAGNOSIS — I5023 Acute on chronic systolic (congestive) heart failure: Secondary | ICD-10-CM | POA: Diagnosis not present

## 2021-04-17 DIAGNOSIS — I110 Hypertensive heart disease with heart failure: Secondary | ICD-10-CM | POA: Diagnosis not present

## 2021-04-17 DIAGNOSIS — I4819 Other persistent atrial fibrillation: Secondary | ICD-10-CM | POA: Diagnosis not present

## 2021-04-17 DIAGNOSIS — E785 Hyperlipidemia, unspecified: Secondary | ICD-10-CM | POA: Diagnosis not present

## 2021-04-17 DIAGNOSIS — I447 Left bundle-branch block, unspecified: Secondary | ICD-10-CM | POA: Diagnosis not present

## 2021-04-17 DIAGNOSIS — I25110 Atherosclerotic heart disease of native coronary artery with unstable angina pectoris: Secondary | ICD-10-CM | POA: Diagnosis not present

## 2021-04-17 DIAGNOSIS — I429 Cardiomyopathy, unspecified: Secondary | ICD-10-CM | POA: Diagnosis not present

## 2021-04-17 DIAGNOSIS — L03115 Cellulitis of right lower limb: Secondary | ICD-10-CM | POA: Diagnosis not present

## 2021-04-17 NOTE — Progress Notes (Signed)
     [address]       [place] [address]             [phone]       Patient: Home Provider: Office Consent for Telemedicine visit obtained.  Today's visit was completed via a real-time telehealth (see specific modality noted below). The patient/authorized person provided oral consent at the time of the visit to engage in a telemedicine encounter with the present provider at [facility]. The patient/authorized person was informed of the potential benefits, limitations, and risks of telemedicine. The patient/authorized person expressed understanding that the laws that protect confidentiality also apply to telemedicine. The patient/authorized person acknowledged understanding that telemedicine does not provide emergency services and that he or she would need to call 911 or proceed to the nearest hospital for help if such a need arose.   Total time spent in the clinical discussion 10 minutes.  Telehealth Modality: Phone visit (audio only)  I had a telephone visit with  Derek Bond who is s/p CABG and left sided maze procedure he is at home and continues to have nursing come out for assessments.  Overall doing well.  He is going out to eat with some friends tonight.  He did go to the emergency department a few days ago for tachycardia.  EKG shows it was sinus tach, and not atrial fibrillation.  He was instructed to increase his Coreg.Pain is minimal.  He is ambulating some but remains weak.  Derek Bond will see [place] back in 1 month with a chest x-ray for cardiac rehab clearance.  Derek Bond

## 2021-04-21 DIAGNOSIS — I2510 Atherosclerotic heart disease of native coronary artery without angina pectoris: Secondary | ICD-10-CM | POA: Diagnosis not present

## 2021-04-21 DIAGNOSIS — I447 Left bundle-branch block, unspecified: Secondary | ICD-10-CM | POA: Diagnosis not present

## 2021-04-21 DIAGNOSIS — I429 Cardiomyopathy, unspecified: Secondary | ICD-10-CM | POA: Diagnosis not present

## 2021-04-21 DIAGNOSIS — I5023 Acute on chronic systolic (congestive) heart failure: Secondary | ICD-10-CM | POA: Diagnosis not present

## 2021-04-21 DIAGNOSIS — I10 Essential (primary) hypertension: Secondary | ICD-10-CM | POA: Diagnosis not present

## 2021-04-21 DIAGNOSIS — I25110 Atherosclerotic heart disease of native coronary artery with unstable angina pectoris: Secondary | ICD-10-CM | POA: Diagnosis not present

## 2021-04-21 DIAGNOSIS — Z48812 Encounter for surgical aftercare following surgery on the circulatory system: Secondary | ICD-10-CM | POA: Diagnosis not present

## 2021-04-21 DIAGNOSIS — Z299 Encounter for prophylactic measures, unspecified: Secondary | ICD-10-CM | POA: Diagnosis not present

## 2021-04-21 DIAGNOSIS — I110 Hypertensive heart disease with heart failure: Secondary | ICD-10-CM | POA: Diagnosis not present

## 2021-04-21 DIAGNOSIS — I4892 Unspecified atrial flutter: Secondary | ICD-10-CM | POA: Diagnosis not present

## 2021-04-21 DIAGNOSIS — E785 Hyperlipidemia, unspecified: Secondary | ICD-10-CM | POA: Diagnosis not present

## 2021-04-21 DIAGNOSIS — L03115 Cellulitis of right lower limb: Secondary | ICD-10-CM | POA: Diagnosis not present

## 2021-04-21 DIAGNOSIS — I4819 Other persistent atrial fibrillation: Secondary | ICD-10-CM | POA: Diagnosis not present

## 2021-04-22 DIAGNOSIS — L03115 Cellulitis of right lower limb: Secondary | ICD-10-CM | POA: Diagnosis not present

## 2021-04-22 DIAGNOSIS — I447 Left bundle-branch block, unspecified: Secondary | ICD-10-CM | POA: Diagnosis not present

## 2021-04-22 DIAGNOSIS — E785 Hyperlipidemia, unspecified: Secondary | ICD-10-CM | POA: Diagnosis not present

## 2021-04-22 DIAGNOSIS — Z48812 Encounter for surgical aftercare following surgery on the circulatory system: Secondary | ICD-10-CM | POA: Diagnosis not present

## 2021-04-22 DIAGNOSIS — I25110 Atherosclerotic heart disease of native coronary artery with unstable angina pectoris: Secondary | ICD-10-CM | POA: Diagnosis not present

## 2021-04-22 DIAGNOSIS — I4819 Other persistent atrial fibrillation: Secondary | ICD-10-CM | POA: Diagnosis not present

## 2021-04-22 DIAGNOSIS — I110 Hypertensive heart disease with heart failure: Secondary | ICD-10-CM | POA: Diagnosis not present

## 2021-04-22 DIAGNOSIS — I5023 Acute on chronic systolic (congestive) heart failure: Secondary | ICD-10-CM | POA: Diagnosis not present

## 2021-04-22 DIAGNOSIS — I429 Cardiomyopathy, unspecified: Secondary | ICD-10-CM | POA: Diagnosis not present

## 2021-04-23 DIAGNOSIS — I110 Hypertensive heart disease with heart failure: Secondary | ICD-10-CM | POA: Diagnosis not present

## 2021-04-23 DIAGNOSIS — E785 Hyperlipidemia, unspecified: Secondary | ICD-10-CM | POA: Diagnosis not present

## 2021-04-23 DIAGNOSIS — I25110 Atherosclerotic heart disease of native coronary artery with unstable angina pectoris: Secondary | ICD-10-CM | POA: Diagnosis not present

## 2021-04-23 DIAGNOSIS — I4819 Other persistent atrial fibrillation: Secondary | ICD-10-CM | POA: Diagnosis not present

## 2021-04-23 DIAGNOSIS — I5023 Acute on chronic systolic (congestive) heart failure: Secondary | ICD-10-CM | POA: Diagnosis not present

## 2021-04-23 DIAGNOSIS — Z48812 Encounter for surgical aftercare following surgery on the circulatory system: Secondary | ICD-10-CM | POA: Diagnosis not present

## 2021-04-23 DIAGNOSIS — I447 Left bundle-branch block, unspecified: Secondary | ICD-10-CM | POA: Diagnosis not present

## 2021-04-23 DIAGNOSIS — I429 Cardiomyopathy, unspecified: Secondary | ICD-10-CM | POA: Diagnosis not present

## 2021-04-23 DIAGNOSIS — L03115 Cellulitis of right lower limb: Secondary | ICD-10-CM | POA: Diagnosis not present

## 2021-04-27 DIAGNOSIS — L03115 Cellulitis of right lower limb: Secondary | ICD-10-CM | POA: Diagnosis not present

## 2021-04-27 DIAGNOSIS — E785 Hyperlipidemia, unspecified: Secondary | ICD-10-CM | POA: Diagnosis not present

## 2021-04-27 DIAGNOSIS — I5023 Acute on chronic systolic (congestive) heart failure: Secondary | ICD-10-CM | POA: Diagnosis not present

## 2021-04-27 DIAGNOSIS — I4819 Other persistent atrial fibrillation: Secondary | ICD-10-CM | POA: Diagnosis not present

## 2021-04-27 DIAGNOSIS — I447 Left bundle-branch block, unspecified: Secondary | ICD-10-CM | POA: Diagnosis not present

## 2021-04-27 DIAGNOSIS — I25110 Atherosclerotic heart disease of native coronary artery with unstable angina pectoris: Secondary | ICD-10-CM | POA: Diagnosis not present

## 2021-04-27 DIAGNOSIS — I110 Hypertensive heart disease with heart failure: Secondary | ICD-10-CM | POA: Diagnosis not present

## 2021-04-27 DIAGNOSIS — Z48812 Encounter for surgical aftercare following surgery on the circulatory system: Secondary | ICD-10-CM | POA: Diagnosis not present

## 2021-04-27 DIAGNOSIS — I429 Cardiomyopathy, unspecified: Secondary | ICD-10-CM | POA: Diagnosis not present

## 2021-04-29 DIAGNOSIS — Z48812 Encounter for surgical aftercare following surgery on the circulatory system: Secondary | ICD-10-CM | POA: Diagnosis not present

## 2021-04-29 DIAGNOSIS — I447 Left bundle-branch block, unspecified: Secondary | ICD-10-CM | POA: Diagnosis not present

## 2021-04-29 DIAGNOSIS — I25110 Atherosclerotic heart disease of native coronary artery with unstable angina pectoris: Secondary | ICD-10-CM | POA: Diagnosis not present

## 2021-04-29 DIAGNOSIS — E785 Hyperlipidemia, unspecified: Secondary | ICD-10-CM | POA: Diagnosis not present

## 2021-04-29 DIAGNOSIS — L03115 Cellulitis of right lower limb: Secondary | ICD-10-CM | POA: Diagnosis not present

## 2021-04-29 DIAGNOSIS — I4819 Other persistent atrial fibrillation: Secondary | ICD-10-CM | POA: Diagnosis not present

## 2021-04-29 DIAGNOSIS — I110 Hypertensive heart disease with heart failure: Secondary | ICD-10-CM | POA: Diagnosis not present

## 2021-04-29 DIAGNOSIS — I5023 Acute on chronic systolic (congestive) heart failure: Secondary | ICD-10-CM | POA: Diagnosis not present

## 2021-04-29 DIAGNOSIS — I429 Cardiomyopathy, unspecified: Secondary | ICD-10-CM | POA: Diagnosis not present

## 2021-05-04 ENCOUNTER — Telehealth: Payer: Self-pay | Admitting: Cardiology

## 2021-05-04 ENCOUNTER — Other Ambulatory Visit: Payer: Self-pay

## 2021-05-04 MED ORDER — MAGNESIUM OXIDE 400 MG PO CAPS: 400.0000 mg | ORAL_CAPSULE | Freq: Every day | ORAL | 0 refills | Status: DC

## 2021-05-05 DIAGNOSIS — Z48812 Encounter for surgical aftercare following surgery on the circulatory system: Secondary | ICD-10-CM | POA: Diagnosis not present

## 2021-05-05 DIAGNOSIS — I4819 Other persistent atrial fibrillation: Secondary | ICD-10-CM | POA: Diagnosis not present

## 2021-05-05 DIAGNOSIS — I429 Cardiomyopathy, unspecified: Secondary | ICD-10-CM | POA: Diagnosis not present

## 2021-05-05 DIAGNOSIS — I447 Left bundle-branch block, unspecified: Secondary | ICD-10-CM | POA: Diagnosis not present

## 2021-05-05 DIAGNOSIS — I5023 Acute on chronic systolic (congestive) heart failure: Secondary | ICD-10-CM | POA: Diagnosis not present

## 2021-05-05 DIAGNOSIS — E785 Hyperlipidemia, unspecified: Secondary | ICD-10-CM | POA: Diagnosis not present

## 2021-05-05 DIAGNOSIS — L03115 Cellulitis of right lower limb: Secondary | ICD-10-CM | POA: Diagnosis not present

## 2021-05-05 DIAGNOSIS — I110 Hypertensive heart disease with heart failure: Secondary | ICD-10-CM | POA: Diagnosis not present

## 2021-05-05 DIAGNOSIS — I25110 Atherosclerotic heart disease of native coronary artery with unstable angina pectoris: Secondary | ICD-10-CM | POA: Diagnosis not present

## 2021-05-06 ENCOUNTER — Other Ambulatory Visit: Payer: Self-pay | Admitting: Cardiology

## 2021-05-06 ENCOUNTER — Telehealth: Payer: Self-pay

## 2021-05-06 DIAGNOSIS — I25110 Atherosclerotic heart disease of native coronary artery with unstable angina pectoris: Secondary | ICD-10-CM | POA: Diagnosis not present

## 2021-05-06 DIAGNOSIS — I429 Cardiomyopathy, unspecified: Secondary | ICD-10-CM | POA: Diagnosis not present

## 2021-05-06 DIAGNOSIS — Z48812 Encounter for surgical aftercare following surgery on the circulatory system: Secondary | ICD-10-CM | POA: Diagnosis not present

## 2021-05-06 DIAGNOSIS — I110 Hypertensive heart disease with heart failure: Secondary | ICD-10-CM | POA: Diagnosis not present

## 2021-05-06 DIAGNOSIS — I447 Left bundle-branch block, unspecified: Secondary | ICD-10-CM | POA: Diagnosis not present

## 2021-05-06 DIAGNOSIS — I5023 Acute on chronic systolic (congestive) heart failure: Secondary | ICD-10-CM | POA: Diagnosis not present

## 2021-05-06 DIAGNOSIS — E785 Hyperlipidemia, unspecified: Secondary | ICD-10-CM | POA: Diagnosis not present

## 2021-05-06 DIAGNOSIS — L03115 Cellulitis of right lower limb: Secondary | ICD-10-CM | POA: Diagnosis not present

## 2021-05-06 DIAGNOSIS — I4819 Other persistent atrial fibrillation: Secondary | ICD-10-CM | POA: Diagnosis not present

## 2021-05-06 NOTE — Telephone Encounter (Signed)
Pt home nurse called and stated that the pt has been having swelling in his legs. No SOB. His weight yesterday was 198 lbs. On 04/27/2021 his weight was 195, the week before that it was 194 lbs. Pt has also been having issues sleeping. He was on xanax and melatonin but neither have been helping. Pt's BP yesterday was 110/70. They are unsure of what to do. Please advise.  Aldona Bar, [place]

## 2021-05-07 ENCOUNTER — Other Ambulatory Visit: Payer: Self-pay

## 2021-05-07 DIAGNOSIS — I5022 Chronic systolic (congestive) heart failure: Secondary | ICD-10-CM

## 2021-05-07 MED ORDER — FUROSEMIDE 40 MG PO TABS: 40.0000 mg | ORAL_TABLET | Freq: Every day | ORAL | 0 refills | Status: DC

## 2021-05-07 MED ORDER — POTASSIUM CHLORIDE CRYS ER 10 MEQ PO TBCR: 10.0000 meq | EXTENDED_RELEASE_TABLET | Freq: Every day | ORAL | 0 refills | Status: DC

## 2021-05-07 NOTE — Telephone Encounter (Signed)
Can start Lasix 40 mg p.o. daily for 1 week and labs thereafter to check kidney function and electrolytes.

## 2021-05-07 NOTE — Telephone Encounter (Signed)
Pt aware.

## 2021-05-12 ENCOUNTER — Other Ambulatory Visit: Payer: Self-pay

## 2021-05-12 DIAGNOSIS — Z79899 Other long term (current) drug therapy: Secondary | ICD-10-CM | POA: Diagnosis not present

## 2021-05-12 DIAGNOSIS — Z7901 Long term (current) use of anticoagulants: Secondary | ICD-10-CM | POA: Diagnosis not present

## 2021-05-12 DIAGNOSIS — I5022 Chronic systolic (congestive) heart failure: Secondary | ICD-10-CM | POA: Diagnosis not present

## 2021-05-12 MED ORDER — METOPROLOL SUCCINATE ER 25 MG PO TB24: 25.0000 mg | ORAL_TABLET | Freq: Every day | ORAL | 0 refills | Status: DC

## 2021-05-12 MED ORDER — ENTRESTO 97-103 MG PO TABS: 1.0000 | ORAL_TABLET | Freq: Two times a day (BID) | ORAL | 1 refills | Status: DC

## 2021-05-13 LAB — BASIC METABOLIC PANEL
BUN/Creatinine Ratio: 27 — ABNORMAL HIGH (ref 10–24)
BUN: 24 mg/dL (ref 8–27)
CO2: 23 mmol/L (ref 20–29)
Calcium: 10 mg/dL (ref 8.6–10.2)
Chloride: 100 mmol/L (ref 96–106)
Creatinine, Ser: 0.88 mg/dL (ref 0.76–1.27)
Glucose: 189 mg/dL — ABNORMAL HIGH (ref 70–99)
Potassium: 4.4 mmol/L (ref 3.5–5.2)
Sodium: 138 mmol/L (ref 134–144)
eGFR: 90 mL/min/{1.73_m2} (ref >59)

## 2021-05-13 LAB — HEMOGLOBIN AND HEMATOCRIT, BLOOD
Hematocrit: 42.9 % (ref 37.5–51.0)
Hemoglobin: 13.5 g/dL (ref 13.0–17.7)

## 2021-05-13 LAB — MAGNESIUM: Magnesium: 1.8 mg/dL (ref 1.6–2.3)

## 2021-05-13 LAB — PRO B NATRIURETIC PEPTIDE: NT-Pro BNP: 1668 pg/mL — ABNORMAL HIGH (ref 0–486)

## 2021-05-13 LAB — TSH: TSH: 3.8 u[IU]/mL (ref 0.450–4.500)

## 2021-05-15 ENCOUNTER — Other Ambulatory Visit: Payer: Self-pay | Admitting: Thoracic Surgery (Cardiothoracic Vascular Surgery)

## 2021-05-15 DIAGNOSIS — I10 Essential (primary) hypertension: Secondary | ICD-10-CM | POA: Diagnosis not present

## 2021-05-15 DIAGNOSIS — E1165 Type 2 diabetes mellitus with hyperglycemia: Secondary | ICD-10-CM | POA: Diagnosis not present

## 2021-05-15 DIAGNOSIS — Z951 Presence of aortocoronary bypass graft: Secondary | ICD-10-CM

## 2021-05-15 NOTE — Progress Notes (Signed)
Called pt to inform him about his lab results. Pt understood

## 2021-05-19 ENCOUNTER — Ambulatory Visit: Payer: Medicare PPO | Admitting: Cardiology

## 2021-05-19 ENCOUNTER — Encounter: Payer: Self-pay | Admitting: Cardiology

## 2021-05-19 ENCOUNTER — Ambulatory Visit
Admission: RE | Admit: 2021-05-19 | Discharge: 2021-05-19 | Disposition: A | Discharge status: Home / Self Care | Payer: Medicare PPO | Source: Ambulatory Visit | Attending: Thoracic Surgery (Cardiothoracic Vascular Surgery) | Admitting: Thoracic Surgery (Cardiothoracic Vascular Surgery)

## 2021-05-19 ENCOUNTER — Encounter: Payer: Self-pay | Admitting: Physician Assistant

## 2021-05-19 ENCOUNTER — Other Ambulatory Visit: Payer: Self-pay

## 2021-05-19 ENCOUNTER — Ambulatory Visit (INDEPENDENT_AMBULATORY_CARE_PROVIDER_SITE_OTHER): Payer: Self-pay | Admitting: Physician Assistant

## 2021-05-19 VITALS — BP 80/55 | HR 72 | Resp 20 | Ht 78.0 in | Wt 192.0 lb

## 2021-05-19 VITALS — BP 110/92 | HR 74 | Temp 97.4°F | Resp 16 | Ht 78.0 in | Wt 193.0 lb

## 2021-05-19 DIAGNOSIS — I255 Ischemic cardiomyopathy: Secondary | ICD-10-CM | POA: Diagnosis not present

## 2021-05-19 DIAGNOSIS — I2510 Atherosclerotic heart disease of native coronary artery without angina pectoris: Secondary | ICD-10-CM

## 2021-05-19 DIAGNOSIS — G4730 Sleep apnea, unspecified: Secondary | ICD-10-CM

## 2021-05-19 DIAGNOSIS — Z79899 Other long term (current) drug therapy: Secondary | ICD-10-CM | POA: Diagnosis not present

## 2021-05-19 DIAGNOSIS — Z951 Presence of aortocoronary bypass graft: Secondary | ICD-10-CM | POA: Diagnosis not present

## 2021-05-19 DIAGNOSIS — Z8679 Personal history of other diseases of the circulatory system: Secondary | ICD-10-CM

## 2021-05-19 DIAGNOSIS — I447 Left bundle-branch block, unspecified: Secondary | ICD-10-CM

## 2021-05-19 DIAGNOSIS — I700 Atherosclerosis of aorta: Secondary | ICD-10-CM | POA: Diagnosis not present

## 2021-05-19 DIAGNOSIS — I4819 Other persistent atrial fibrillation: Secondary | ICD-10-CM

## 2021-05-19 DIAGNOSIS — E1129 Type 2 diabetes mellitus with other diabetic kidney complication: Secondary | ICD-10-CM

## 2021-05-19 DIAGNOSIS — I5022 Chronic systolic (congestive) heart failure: Secondary | ICD-10-CM

## 2021-05-19 DIAGNOSIS — Z9889 Other specified postprocedural states: Secondary | ICD-10-CM | POA: Diagnosis not present

## 2021-05-19 DIAGNOSIS — E782 Mixed hyperlipidemia: Secondary | ICD-10-CM

## 2021-05-19 DIAGNOSIS — J9811 Atelectasis: Secondary | ICD-10-CM | POA: Diagnosis not present

## 2021-05-19 DIAGNOSIS — I10 Essential (primary) hypertension: Secondary | ICD-10-CM

## 2021-05-19 DIAGNOSIS — J90 Pleural effusion, not elsewhere classified: Secondary | ICD-10-CM | POA: Diagnosis not present

## 2021-05-19 DIAGNOSIS — Z7901 Long term (current) use of anticoagulants: Secondary | ICD-10-CM | POA: Diagnosis not present

## 2021-05-19 MED ORDER — DAPAGLIFLOZIN PROPANEDIOL 10 MG PO TABS: 10.0000 mg | ORAL_TABLET | Freq: Every day | ORAL | 0 refills | Status: DC

## 2021-05-19 NOTE — H&P (View-Only) (Signed)
ID:  Derek Bond, DOB Nov 30, 1945, MRN [id number]  PCP:  Glenda Chroman, MD  Cardiologist:  Rex Kras, DO, [place] (established care 01/26/2021)  Date: 05/19/21 Last Office Visit: 03/31/2021  Chief Complaint  Patient presents with   Coronary Artery Disease   heart failure management   Follow-up    HPI  Derek Bond is a 76 y.o. male who presents to the office with a chief complaint of "  heart failure management." Patient's past medical history and cardiovascular risk factors include: Ischemic cardiomyopathy, acute/chronic heart failure with reduced EF, CAD s/p CABG / Clipping of atrial appendage using 36mm atrial clip, hypertension, diabetes mellitus with microalbuminuria, erectile dysfunction, heartburn, former smoker, hyperlipidemia, sleep apnea not on CPAP, persistent atrial fibrillation status post Maze procedure, advanced age.  He is referred to the office at the request of Derek Bears B, MD for evaluation of shortness of breath.  Patient is accompanied by his daughter Derek Bond at today's office visit.  He verbally provides verbal consent with regards to discussing his medical information in her presence.  Initially presented to the office for symptoms of shortness of breath and on surface ECG was noted to have atrial fibrillation.  He was started on pharmacological therapy and later presented to the ED for chest pain on multiple occasions.  Patient underwent left heart catheterization was noted to have multivessel CAD and underwent four-vessel bypass, left atrial Maze procedure, and a 45 mm atrial clip placement and was discharged home on 03/04/2021.  Since his bypass surgery he has been followed closely to uptitrate his GDMT given his underlying heart failure.  Patient is on appropriate guideline directed medical therapy.  Since last office visit patient denies orthopnea, paroxysmal nocturnal dyspnea or lower extremity swelling.  His weight remains relatively  stable.  Patient continues to be in atrial fibrillation with controlled ventricular rate.  He also complains of being tired, fatigued which remains chronic and stable.  Patient has not initiated cardiac rehab until he is cleared by cardiothoracic surgery.  Since discharge from his coronary artery bypass surgery he had to be rehospitalized due to right lower extremity cellulitis requiring IV antibiotics.  He has a follow-up appointment with CT surgery later today for reevaluation.  However, he is currently using a walker and as needed basis and increasing physical activity as tolerated at home.   FUNCTIONAL STATUS: No structured exercise program or daily routine.   ALLERGIES: Allergies  Allergen Reactions   Bee Venom     MEDICATION LIST PRIOR TO VISIT: Current Meds  Medication Sig   acetaminophen (TYLENOL) 500 MG tablet Take 1,000 mg by mouth every 6 (six) hours as needed for moderate pain or mild pain.   ALPRAZolam (XANAX) 0.5 MG tablet Take 0.5 mg by mouth at bedtime as needed for sleep.   amiodarone (PACERONE) 100 MG tablet Take 1 tablet (100 mg total) by mouth daily.   apixaban (ELIQUIS) 5 MG TABS tablet Take 1 tablet (5 mg total) by mouth 2 (two) times daily.   aspirin EC 81 MG tablet Take 81 mg by mouth daily.   benzonatate (TESSALON) 200 MG capsule Take 200 mg by mouth 3 (three) times daily as needed for cough.   famotidine (PEPCID) 40 MG tablet Take 40 mg by mouth at bedtime.   feeding supplement (ENSURE ENLIVE / ENSURE PLUS) LIQD Take 237 mLs by mouth 2 (two) times daily between meals. Until oral intake improves   furosemide (LASIX) 40 MG tablet Take 1 tablet (  40 mg total) by mouth daily.   glipiZIDE (GLUCOTROL XL) 10 MG 24 hr tablet Take 10 mg by mouth 2 (two) times daily.   Magnesium Oxide 400 MG CAPS Take 1 capsule (400 mg total) by mouth daily.   metFORMIN (GLUCOPHAGE) 1000 MG tablet Take 1,000 mg by mouth daily with breakfast.   metoprolol succinate (TOPROL-XL) 25 MG 24 hr  tablet Take 1 tablet (25 mg total) by mouth daily.   pantoprazole (PROTONIX) 40 MG tablet Take 40 mg by mouth every evening.   potassium chloride (KLOR-CON M) 10 MEQ tablet Take 1 tablet (10 mEq total) by mouth daily.   rosuvastatin (CRESTOR) 5 MG tablet Take 5 mg by mouth at bedtime.   sacubitril-valsartan (ENTRESTO) 97-103 MG Take 1 tablet by mouth 2 (two) times daily.   traMADol (ULTRAM) 50 MG tablet Take 1 tablet (50 mg total) by mouth every 4 (four) hours as needed for moderate pain.   [DISCONTINUED] dapagliflozin propanediol (FARXIGA) 10 MG TABS tablet Take by mouth daily.     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    Due to laceration at age 73    Hiatal hernia    History of melanoma    Obstructive sleep apnea    Type 2 diabetes mellitus ([place])     PAST SURGICAL HISTORY: Past Surgical History:  Procedure Laterality Date   BIOPSY  12/21/2019   Procedure: BIOPSY;  Surgeon: Harvel Quale, MD;  [facility];  Service: Gastroenterology;;  random colon   CLIPPING OF ATRIAL APPENDAGE N/A 02/26/2021   Procedure: CLIPPING OF ATRIAL APPENDAGE USING ATRICURE CLIP SIZE 40;  Surgeon: Lajuana Matte, MD;  Location: [place];  Service: Open Heart Surgery;  Laterality: N/A;   COLONOSCOPY WITH PROPOFOL N/A 12/21/2019   Procedure: COLONOSCOPY WITH PROPOFOL;  Surgeon: Harvel Quale, MD;  [facility];  Service: Gastroenterology;  Laterality: N/A;  915   CORONARY ARTERY BYPASS GRAFT N/A 02/26/2021   Procedure: CORONARY ARTERY BYPASS GRAFTING (CABG) X4, ON PUMP, USING LEFT INTERNAL MAMMARY ARTERY AND RIGHT ENDOSCOPICALLY HARVESTED GREATER SAPHENOUS VEIN;  Surgeon: Lajuana Matte, MD;  Location: [place];  Service: Open  Heart Surgery;  Laterality: N/A;   ENDOVEIN HARVEST OF GREATER SAPHENOUS VEIN Right 02/26/2021   Procedure: ENDOVEIN HARVEST OF GREATER SAPHENOUS VEIN;  Surgeon: Lajuana Matte, MD;  Location: [place];  Service: Open Heart Surgery;  Laterality: Right;   MAZE N/A 02/26/2021   Procedure: MAZE;  Surgeon: Lajuana Matte, MD;  Location: [place];  Service: Open Heart Surgery;  Laterality: N/A;   MELANOMA EXCISION     Left facial region   REPLACEMENT TOTAL KNEE Left    RIGHT/LEFT HEART CATH AND CORONARY ANGIOGRAPHY N/A 02/23/2021   Procedure: RIGHT/LEFT HEART CATH AND CORONARY ANGIOGRAPHY;  Surgeon: Nigel Mormon, MD;  Location: [place] [facility];  Service: Cardiovascular;  Laterality: N/A;   TEE WITHOUT CARDIOVERSION N/A 02/26/2021   Procedure: TRANSESOPHAGEAL ECHOCARDIOGRAM (TEE);  Surgeon: Lajuana Matte, MD;  Location: [place];  Service: Open Heart Surgery;  Laterality: N/A;   UMBILICAL HERNIA REPAIR      FAMILY HISTORY: The patient family history includes Diabetes in his brother, mother, and another family member;  Hypertension in his brother, brother, sister, sister, and another family member; Lung cancer in his father; Rheum arthritis in an other family member; Stroke in an other family member.  SOCIAL HISTORY:  The patient  reports that he quit smoking about 32 years ago. His smoking use included cigarettes. He has a 60.00 pack-year smoking history. He has never used smokeless tobacco. He reports that he does not currently use alcohol. He reports that he does not use drugs.  REVIEW OF SYSTEMS: Review of Systems  Constitutional: Negative for chills, fever and malaise/fatigue.  HENT:  Negative for hoarse voice and nosebleeds.   Eyes:  Negative for discharge, double vision and pain.  Cardiovascular:  Negative for chest pain, claudication, dyspnea on exertion, leg swelling, near-syncope, orthopnea, palpitations, paroxysmal nocturnal dyspnea and syncope.  Respiratory:   Negative for hemoptysis and shortness of breath.   Endocrine: Negative for heat intolerance.  Musculoskeletal:  Negative for muscle cramps and myalgias.  Gastrointestinal:  Negative for abdominal pain, constipation, diarrhea, heartburn, hematemesis, hematochezia, melena, nausea and vomiting.  Neurological:  Negative for dizziness and light-headedness.   PHYSICAL EXAM: Vitals with BMI 05/19/2021 05/19/2021 04/15/2021  Height 6\' 6"  6\' 6"  -  Weight 192 lbs 193 lbs -  BMI 37.62 83.15 -  Systolic 80 176 160  Diastolic 55 92 82  Pulse 72 74 102    CONSTITUTIONAL: Well-developed and well-nourished. No acute distress.  Presents in a wheelchair. SKIN: Skin is warm and dry. No rash noted. No cyanosis. No pallor. No jaundice HEAD: Normocephalic and atraumatic.  EYES: No scleral icterus MOUTH/THROAT: Moist oral membranes.  NECK: No JVD present. No thyromegaly noted. No carotid bruits  LYMPHATIC: No visible cervical adenopathy.  CHEST Normal respiratory effort. No intercostal retractions.  Sternotomy site is healing well. LUNGS: Clear to auscultation bilaterally.  No stridor. No wheezes. No rales.  CARDIOVASCULAR: Irregularly irregular, variable S1-S2, no murmurs rubs or gallops appreciated secondary to tachycardia. ABDOMINAL: Soft, nontender, nondistended, positive bowel sounds in all 4 quadrants, no apparent ascites.  EXTREMITIES: No pitting edema. Right lower extremity warm to touch, erythematous.  No active drainage or wounds. HEMATOLOGIC: No significant bruising NEUROLOGIC: Oriented to person, place, and time. Nonfocal. Normal muscle tone.  PSYCHIATRIC: Normal mood and affect. Normal behavior. Cooperative 's CARDIAC DATABASE: 02/26/2021: CABG x4 (LIMA to LAD, SVG to DIAGONAL, SVG to OM, SVG to PLB) + MAZE + 45 mm atrial clip placement.  EKG: 01/26/2021: Atrial fibrillation with rapid ventricular rate 126 bpm, left axis deviation, left anterior fascicular block, left bundle branch block,  consider old inferior infarct.  05/19/2021: Atrial fibrillation, 110 bpm, left axis, LBBB.  Echocardiogram: 02/05/2021:  Severely depressed LV systolic function with visual EF 25-30%. Hypokinetic global wall motion with regionality involving septal wall. Left ventricle cavity is mildly dilated. Moderate left ventricular hypertrophy. Unable to evaluate diastolic function due to atrial fibrillation. Elevated LAP.  Moderate (Grade II) mitral regurgitation.  Moderate tricuspid regurgitation. Mild pulmonary hypertension, RVSP 53mmHg.  IVC is dilated with a respiratory response of <50%.  No prior study for comparison.  02/25/2021:  1. Left ventricular ejection fraction, by estimation, is 30 to 35%. The left ventricle has moderately decreased function. The left ventricle demonstrates global hypokinesis. There is moderate left ventricular hypertrophy. Left ventricular diastolic function could not be evaluated.   2. Right ventricular systolic function is normal. The right ventricular size is mildly enlarged. There is normal pulmonary artery systolic pressure.   3. Left atrial size was severely dilated.   4.  Right atrial size was dilated.   5. The mitral valve is normal in structure. Mild mitral valve regurgitation. No evidence of mitral stenosis.   6. The aortic valve is tricuspid. Aortic valve regurgitation is not visualized.   7. There is mild dilatation of the ascending aorta, measuring 40 mm.   Stress Testing: MPI 02/12/2016: No diagnostic ST segment changes to indicate ischemia. No significant arrhythmias. Small, mild intensity, partially reversible mid to basal inferior defect suggestive of variable soft tissue attenuation. No large ischemic zones are noted. This is a low risk study. Nuclear stress EF: 55%.  Heart Catheterization: Left and right heart catheterization 02/23/2021 LM: Normal LAD: Prox 80% stenosius at bifurcation with small to medium caliber D1 with 75% long prox disease. Mid  70% disease Ramus: Minimal luminal irregularities Lcx: OM1 with prox 80% stenosis RCA: 60-70% disease in PL branches   RA: 2 mmHg RV: 22/0 mmHg PA: 21/5 mmHg, mPAP 13 mmHg PCW: 8 mmHg   CO: 5.6 L/min CI: 2.5 L/min/m2     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%, diabetic patient CVTS consult for CABG  LABORATORY DATA: CBC Latest Ref Rng & Units 05/12/2021 04/14/2021 03/31/2021  WBC 4.0 - 10.5 K/uL - 6.5 5.0  Hemoglobin 13.0 - 17.7 g/dL 13.5 14.1 13.6  Hematocrit 37.5 - 51.0 % 42.9 44.2 41.3  Platelets 150 - 400 K/uL - 378 292    CMP Latest Ref Rng & Units 05/12/2021 04/14/2021 03/31/2021  Glucose 70 - 99 mg/dL 189(H) 129(H) 185(H)  BUN 8 - 27 mg/dL 24 12 21   Creatinine 0.76 - 1.27 mg/dL 0.88 0.85 1.04  Sodium 134 - 144 mmol/L 138 135 134(L)  Potassium 3.5 - 5.2 mmol/L 4.4 4.3 4.6  Chloride 96 - 106 mmol/L 100 101 100  CO2 20 - 29 mmol/L 23 23 23   Calcium 8.6 - 10.2 mg/dL 10.0 9.6 9.4  Total Protein 6.5 - 8.1 g/dL - - -  Total Bilirubin 0.3 - 1.2 mg/dL - - -  Alkaline Phos 38 - 126 U/L - - -  AST 15 - 41 U/L - - -  ALT 0 - 44 U/L - - -    Lipid Panel     Component Value Date/Time   CHOL 99 02/22/2021 0131   TRIG 56 02/22/2021 0131   HDL 44 02/22/2021 0131   CHOLHDL 2.3 02/22/2021 0131   VLDL 11 02/22/2021 0131   LDLCALC 44 02/22/2021 0131    No components found for: NTPROBNP Recent Labs    02/05/21 1441 02/18/21 1534 03/23/21 1459 03/30/21 0957 05/12/21 1346  PROBNP 1,567* 2,355* 1,938* 1,722* 1,668*   Recent Labs    03/30/21 0958 04/15/21 0411 05/12/21 1346  TSH 6.220* 4.161 3.800    BMP Recent Labs    03/02/21 0126 03/23/21 1459 03/31/21 2048 04/14/21 1419 05/12/21 1346  NA 131*   < > 134* 135 138  K 4.4   < > 4.6 4.3 4.4  CL 102   < > 100 101 100  CO2 23   < > 23 23 23   GLUCOSE 174*   < > 185* 129* 189*  BUN 22   < > 21 12 24   CREATININE 0.76   < > 1.04 0.85 0.88  CALCIUM 8.8*   < > 9.4 9.6 10.0  GFRNONAA >60  --  >60 >60   --    < > = values in this interval not displayed.    HEMOGLOBIN A1C Lab Results  Component Value Date  HGBA1C 8.1 (H) 02/22/2021   MPG 185.77 02/22/2021    IMPRESSION:    ICD-10-CM   1. Chronic HFrEF (heart failure with reduced ejection fraction) (HCC)  I50.22 EKG 12-Lead    dapagliflozin propanediol (FARXIGA) 10 MG TABS tablet    2. Persistent atrial fibrillation (HCC)  I48.19     3. S/P Maze operation for atrial fibrillation  Z98.890    Z86.79     4. Long term (current) use of anticoagulants  Z79.01     5. Long term current use of antiarrhythmic drug  Z79.899     6. Atherosclerosis of native coronary artery of native heart without angina pectoris  I25.10     7. S/P CABG x 4  Z95.1     8. LBBB (left bundle branch block)  I44.7     9. Ischemic cardiomyopathy  I25.5     10. Type 2 diabetes mellitus with microalbuminuria, without long-term current use of insulin (HCC)  E11.29    R80.9     11. Mixed hyperlipidemia  E78.2     12. Benign hypertension  I10     13. Sleep apnea, unspecified type  G47.30         RECOMMENDATIONS: Deloy Ray Dancy is a 76 y.o. male whose past medical history and cardiac risk factors include: Ischemic cardiomyopathy, acute/chronic heart failure with reduced EF, hypertension, diabetes mellitus with microalbuminuria, erectile dysfunction, heartburn, former smoker, hyperlipidemia, sleep apnea not on CPAP, persistent atrial fibrillation status post Maze procedure, advanced age.  Chronic HFrEF (heart failure with reduced ejection fraction)/ischemic cardiomyopathy (HCC) Stage C, NYHA class II Medications reconciled. Echo 02/25/2021: LVEF 30-35% Weight remains stable since last office visit. His Entresto has been uptitrated to 97/103 mg p.o. twice daily. Patient requesting refills for Farxiga. Will hold off on addition of spironolactone at this time as his blood pressures are lower limit of normal.  And he already feels tired and fatigued.  We  will continue to monitor. NT proBNP trending downward. Recommend daily weight check, strict I/O's Fluid restriction to <2L per day, Na restriction < 1.5g per day.  Persistent atrial fibrillation (HCC) Rate control: Carvedilol. Rhythm control: Amiodarone. Thromboembolic prophylaxis: Eliquis CHA2DS2-VASc SCORE is 5 which correlates to 6.7% risk of stroke per year. I suspect that his underlying generalized tired and fatigue may be secondary to hypothyroidism and underlying atrial fibrillation.  I have asked him to follow-up with his PCP with regards to the management of hypothyroidism.  Also discussed with the patient and his daughter Derek Bond today's visit with regards to proceeding with TEE guided cardioversion to restore normal sinus rhythm.  After careful review of history and examination, the risks, benefits of transesophageal echocardiogram, and alternatives have been explained to the patient and daughter Derek Bond. Complications include but not limited to esophageal perforation (rare), gastrointestinal bleeding (rare), cardiac arrhythmia which can include cardiac arrest and death (rare), pharyngeal irritation / discomfort with swallowing / hematoma, methemoglobinemia, bronchospasm, transient hypoxia, nonsustained ventricular tachycardia, transient atrial fibrillation, minimal hemoptysis, vomiting, hypotension, respiratory compromise, reaction to medications, unavoidable damage to teeth and gums, aspiration pneumonia  were reviewed with the patient.  Patient voices understands, provides verbal feedback, questions answered, and patient and daughter Derek Bond wishes to proceed with the procedure.  Risks, benefits, and alternatives of direct current cardioversion reviewed with the and patient and his daughter Derek Bond. Risk includes but not limited to: potential for post-cardioversion rhythms, life-threatening arrhythmias (ventricular tachycardia and fibrillation, profound bradycardia, cardiac arrest),  myocardial damage, acute pulmonary edema, skin  burns, transient hypotension. Benefits include restoration of sinus rhythm. Alternatives to treatment were discussed, questions were answered, patient and his daughter voices understanding and provides verbal feedback.  Patient is willing to proceed.   Long term (current) use of anticoagulant & S/P Maze for atrial fibrillation, 63mm LAA clip.  Indication: Persistent atrial fibrillation. Patient did restore normal sinus rhythm shortly after his bypass surgery went back into atrial fibrillation. Reemphasized the risks, benefits, and alternatives to oral anticoagulation. Patient does not endorse any evidence of bleeding. Most recent hemoglobin 13.5 g/dL as of December 2022 We will proceed with TEE guided cardioversion as discussed above.  Atherosclerosis of native coronary artery of native heart, four-vessel bypass, without angina pectoris Denies angina pectoris. No use of sublingual nitroglycerin tablets. Once cleared by cardiothoracic surgery patient will be enrolled into cardiac rehab.  S/P CABG x 4 See above. Educated on the importance of improving his modifiable cardiovascular risk factors.  Type 2 diabetes mellitus with microalbuminuria, without long-term current use of insulin ([place]) Continue Bertell Maria, Farxiga, statin therapy Educated on the importance of glycemic control.  Mixed hyperlipidemia Currently on Crestor.   He denies myalgia or other side effects. Most recent lipids dated 02/2021 reviewed as noted above.  LDL currently at goal  Benign hypertension Office blood pressures within acceptable range. Reemphasized importance of low-salt diet. Medications reconciled.  FINAL MEDICATION LIST END OF ENCOUNTER: Meds ordered this encounter  Medications   dapagliflozin propanediol (FARXIGA) 10 MG TABS tablet    Sig: Take 1 tablet (10 mg total) by mouth daily.    Dispense:  90 tablet    Refill:  0   Medications Discontinued During  This Encounter  Medication Reason   barrier cream (NON-SPECIFIED) CREA    dapagliflozin propanediol (FARXIGA) 10 MG TABS tablet Reorder      Current Outpatient Medications:    acetaminophen (TYLENOL) 500 MG tablet, Take 1,000 mg by mouth every 6 (six) hours as needed for moderate pain or mild pain., Disp: , Rfl:    ALPRAZolam (XANAX) 0.5 MG tablet, Take 0.5 mg by mouth at bedtime as needed for sleep., Disp: , Rfl:    amiodarone (PACERONE) 100 MG tablet, Take 1 tablet (100 mg total) by mouth daily., Disp: , Rfl:    apixaban (ELIQUIS) 5 MG TABS tablet, Take 1 tablet (5 mg total) by mouth 2 (two) times daily., Disp: 180 tablet, Rfl: 0   aspirin EC 81 MG tablet, Take 81 mg by mouth daily., Disp: , Rfl:    benzonatate (TESSALON) 200 MG capsule, Take 200 mg by mouth 3 (three) times daily as needed for cough., Disp: , Rfl:    famotidine (PEPCID) 40 MG tablet, Take 40 mg by mouth at bedtime., Disp: , Rfl:    feeding supplement (ENSURE ENLIVE / ENSURE PLUS) LIQD, Take 237 mLs by mouth 2 (two) times daily between meals. Until oral intake improves, Disp: 237 mL, Rfl: 12   furosemide (LASIX) 40 MG tablet, Take 1 tablet (40 mg total) by mouth daily., Disp: 30 tablet, Rfl: 0   glipiZIDE (GLUCOTROL XL) 10 MG 24 hr tablet, Take 10 mg by mouth 2 (two) times daily., Disp: , Rfl:    Magnesium Oxide 400 MG CAPS, Take 1 capsule (400 mg total) by mouth daily., Disp: 90 capsule, Rfl: 0   metFORMIN (GLUCOPHAGE) 1000 MG tablet, Take 1,000 mg by mouth daily with breakfast., Disp: , Rfl:    metoprolol succinate (TOPROL-XL) 25 MG 24 hr tablet, Take 1 tablet (25 mg total)  by mouth daily., Disp: 90 tablet, Rfl: 0   pantoprazole (PROTONIX) 40 MG tablet, Take 40 mg by mouth every evening., Disp: , Rfl:    potassium chloride (KLOR-CON M) 10 MEQ tablet, Take 1 tablet (10 mEq total) by mouth daily., Disp: 30 tablet, Rfl: 0   rosuvastatin (CRESTOR) 5 MG tablet, Take 5 mg by mouth at bedtime., Disp: , Rfl:     sacubitril-valsartan (ENTRESTO) 97-103 MG, Take 1 tablet by mouth 2 (two) times daily., Disp: 180 tablet, Rfl: 1   traMADol (ULTRAM) 50 MG tablet, Take 1 tablet (50 mg total) by mouth every 4 (four) hours as needed for moderate pain., Disp: 30 tablet, Rfl: 0   dapagliflozin propanediol (FARXIGA) 10 MG TABS tablet, Take 1 tablet (10 mg total) by mouth daily., Disp: 90 tablet, Rfl: 0  Orders Placed This Encounter  Procedures   EKG 12-Lead   There are no Patient Instructions on file for this visit.   --Continue cardiac medications as reconciled in final medication list. --Return in about 6 weeks (around 06/30/2021) for Follow up, A. fib, s/p TEE cardioversion, and EKG on arrival. . Or sooner if needed. --Continue follow-up with your primary care physician regarding the management of your other chronic comorbid conditions.  Patient's questions and concerns were addressed to his satisfaction. He voices understanding of the instructions provided during this encounter.   This note was created using a voice recognition software as a result there may be grammatical errors inadvertently enclosed that do not reflect the nature of this encounter. Every attempt is made to correct such errors.  Rex Kras, [place], [facility]  Pager: [phone] Office: [phone]

## 2021-05-19 NOTE — Progress Notes (Signed)
ID:  Derek Bond, DOB [birth year], MRN [id number]  PCP:  Glenda Chroman, MD  Cardiologist:  Rex Kras, DO, [facility] (established care 01/26/2021)  Date: 05/19/21 Last Office Visit: 03/31/2021  Chief Complaint  Patient presents with   Coronary Artery Disease   heart failure management   Follow-up    HPI  Derek Bond is a 76 y.o. male who presents to the office with a chief complaint of "  heart failure management." Patient's past medical history and cardiovascular risk factors include: Ischemic cardiomyopathy, acute/chronic heart failure with reduced EF, CAD s/p CABG / Clipping of atrial appendage using 10mm atrial clip, hypertension, diabetes mellitus with microalbuminuria, erectile dysfunction, heartburn, former smoker, hyperlipidemia, sleep apnea not on CPAP, persistent atrial fibrillation status post Maze procedure, advanced age.  He is referred to the office at the request of Derek Bears B, MD for evaluation of shortness of breath.  Patient is accompanied by his daughter [name] at today's office visit.  He verbally provides verbal consent with regards to discussing his medical information in her presence.  Initially presented to the office for symptoms of shortness of breath and on surface ECG was noted to have atrial fibrillation.  He was started on pharmacological therapy and later presented to the ED for chest pain on multiple occasions.  Patient underwent left heart catheterization was noted to have multivessel CAD and underwent four-vessel bypass, left atrial Maze procedure, and a 45 mm atrial clip placement and was discharged home on 03/04/2021.  Since his bypass surgery he has been followed closely to uptitrate his GDMT given his underlying heart failure.  Patient is on appropriate guideline directed medical therapy.  Since last office visit patient denies orthopnea, paroxysmal nocturnal dyspnea or lower extremity swelling.  His weight remains relatively  stable.  Patient continues to be in atrial fibrillation with controlled ventricular rate.  He also complains of being tired, fatigued which remains chronic and stable.  Patient has not initiated cardiac rehab until he is cleared by cardiothoracic surgery.  Since discharge from his coronary artery bypass surgery he had to be rehospitalized due to right lower extremity cellulitis requiring IV antibiotics.  He has a follow-up appointment with CT surgery later today for reevaluation.  However, he is currently using a walker and as needed basis and increasing physical activity as tolerated at home.   FUNCTIONAL STATUS: No structured exercise program or daily routine.   ALLERGIES: Allergies  Allergen Reactions   Bee Venom     MEDICATION LIST PRIOR TO VISIT: Current Meds  Medication Sig   acetaminophen (TYLENOL) 500 MG tablet Take 1,000 mg by mouth every 6 (six) hours as needed for moderate pain or mild pain.   ALPRAZolam (XANAX) 0.5 MG tablet Take 0.5 mg by mouth at bedtime as needed for sleep.   amiodarone (PACERONE) 100 MG tablet Take 1 tablet (100 mg total) by mouth daily.   apixaban (ELIQUIS) 5 MG TABS tablet Take 1 tablet (5 mg total) by mouth 2 (two) times daily.   aspirin EC 81 MG tablet Take 81 mg by mouth daily.   benzonatate (TESSALON) 200 MG capsule Take 200 mg by mouth 3 (three) times daily as needed for cough.   famotidine (PEPCID) 40 MG tablet Take 40 mg by mouth at bedtime.   feeding supplement (ENSURE ENLIVE / ENSURE PLUS) LIQD Take 237 mLs by mouth 2 (two) times daily between meals. Until oral intake improves   furosemide (LASIX) 40 MG tablet Take 1 tablet (  40 mg total) by mouth daily.   glipiZIDE (GLUCOTROL XL) 10 MG 24 hr tablet Take 10 mg by mouth 2 (two) times daily.   Magnesium Oxide 400 MG CAPS Take 1 capsule (400 mg total) by mouth daily.   metFORMIN (GLUCOPHAGE) 1000 MG tablet Take 1,000 mg by mouth daily with breakfast.   metoprolol succinate (TOPROL-XL) 25 MG 24 hr  tablet Take 1 tablet (25 mg total) by mouth daily.   pantoprazole (PROTONIX) 40 MG tablet Take 40 mg by mouth every evening.   potassium chloride (KLOR-CON M) 10 MEQ tablet Take 1 tablet (10 mEq total) by mouth daily.   rosuvastatin (CRESTOR) 5 MG tablet Take 5 mg by mouth at bedtime.   sacubitril-valsartan (ENTRESTO) 97-103 MG Take 1 tablet by mouth 2 (two) times daily.   traMADol (ULTRAM) 50 MG tablet Take 1 tablet (50 mg total) by mouth every 4 (four) hours as needed for moderate pain.   [DISCONTINUED] dapagliflozin propanediol (FARXIGA) 10 MG TABS tablet Take by mouth daily.     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10    Hiatal hernia    History of melanoma    Obstructive sleep apnea    Type 2 diabetes mellitus ([place])     PAST SURGICAL HISTORY: Past Surgical History:  Procedure Laterality Date   BIOPSY  12/21/2019   Procedure: BIOPSY;  Surgeon: Harvel Quale, MD;  [facility];  Service: Gastroenterology;;  random colon   CLIPPING OF ATRIAL APPENDAGE N/A 02/26/2021   Procedure: CLIPPING OF ATRIAL APPENDAGE USING ATRICURE CLIP SIZE 31;  Surgeon: Lajuana Matte, MD;  Location: [place];  Service: Open Heart Surgery;  Laterality: N/A;   COLONOSCOPY WITH PROPOFOL N/A 12/21/2019   Procedure: COLONOSCOPY WITH PROPOFOL;  Surgeon: Harvel Quale, MD;  [facility];  Service: Gastroenterology;  Laterality: N/A;  915   CORONARY ARTERY BYPASS GRAFT N/A 02/26/2021   Procedure: CORONARY ARTERY BYPASS GRAFTING (CABG) X4, ON PUMP, USING LEFT INTERNAL MAMMARY ARTERY AND RIGHT ENDOSCOPICALLY HARVESTED GREATER SAPHENOUS VEIN;  Surgeon: Lajuana Matte, MD;  Location: [place];  Service: Open  Heart Surgery;  Laterality: N/A;   ENDOVEIN HARVEST OF GREATER SAPHENOUS VEIN Right 02/26/2021   Procedure: ENDOVEIN HARVEST OF GREATER SAPHENOUS VEIN;  Surgeon: Lajuana Matte, MD;  Location: [place];  Service: Open Heart Surgery;  Laterality: Right;   MAZE N/A 02/26/2021   Procedure: MAZE;  Surgeon: Lajuana Matte, MD;  Location: [place];  Service: Open Heart Surgery;  Laterality: N/A;   MELANOMA EXCISION     Left facial region   REPLACEMENT TOTAL KNEE Left    RIGHT/LEFT HEART CATH AND CORONARY ANGIOGRAPHY N/A 02/23/2021   Procedure: RIGHT/LEFT HEART CATH AND CORONARY ANGIOGRAPHY;  Surgeon: Nigel Mormon, MD;  Location: [place] [facility];  Service: Cardiovascular;  Laterality: N/A;   TEE WITHOUT CARDIOVERSION N/A 02/26/2021   Procedure: TRANSESOPHAGEAL ECHOCARDIOGRAM (TEE);  Surgeon: Lajuana Matte, MD;  Location: [place];  Service: Open Heart Surgery;  Laterality: N/A;   UMBILICAL HERNIA REPAIR      FAMILY HISTORY: The patient family history includes Diabetes in his brother, mother, and another family member;  Hypertension in his brother, brother, sister, sister, and another family member; Lung cancer in his father; Rheum arthritis in an other family member; Stroke in an other family member.  SOCIAL HISTORY:  The patient  reports that he quit smoking about 32 years ago. His smoking use included cigarettes. He has a 60.00 pack-year smoking history. He has never used smokeless tobacco. He reports that he does not currently use alcohol. He reports that he does not use drugs.  REVIEW OF SYSTEMS: Review of Systems  Constitutional: Negative for chills, fever and malaise/fatigue.  HENT:  Negative for hoarse voice and nosebleeds.   Eyes:  Negative for discharge, double vision and pain.  Cardiovascular:  Negative for chest pain, claudication, dyspnea on exertion, leg swelling, near-syncope, orthopnea, palpitations, paroxysmal nocturnal dyspnea and syncope.  Respiratory:   Negative for hemoptysis and shortness of breath.   Endocrine: Negative for heat intolerance.  Musculoskeletal:  Negative for muscle cramps and myalgias.  Gastrointestinal:  Negative for abdominal pain, constipation, diarrhea, heartburn, hematemesis, hematochezia, melena, nausea and vomiting.  Neurological:  Negative for dizziness and light-headedness.   PHYSICAL EXAM: Vitals with BMI 05/19/2021 05/19/2021 04/15/2021  Height 6\' 6"  6\' 6"  -  Weight 192 lbs 193 lbs -  BMI 12.75 17.00 -  Systolic 80 174 944  Diastolic 55 92 82  Pulse 72 74 102    CONSTITUTIONAL: Well-developed and well-nourished. No acute distress.  Presents in a wheelchair. SKIN: Skin is warm and dry. No rash noted. No cyanosis. No pallor. No jaundice HEAD: Normocephalic and atraumatic.  EYES: No scleral icterus MOUTH/THROAT: Moist oral membranes.  NECK: No JVD present. No thyromegaly noted. No carotid bruits  LYMPHATIC: No visible cervical adenopathy.  CHEST Normal respiratory effort. No intercostal retractions.  Sternotomy site is healing well. LUNGS: Clear to auscultation bilaterally.  No stridor. No wheezes. No rales.  CARDIOVASCULAR: Irregularly irregular, variable S1-S2, no murmurs rubs or gallops appreciated secondary to tachycardia. ABDOMINAL: Soft, nontender, nondistended, positive bowel sounds in all 4 quadrants, no apparent ascites.  EXTREMITIES: No pitting edema. Right lower extremity warm to touch, erythematous.  No active drainage or wounds. HEMATOLOGIC: No significant bruising NEUROLOGIC: Oriented to person, place, and time. Nonfocal. Normal muscle tone.  PSYCHIATRIC: Normal mood and affect. Normal behavior. Cooperative 's CARDIAC DATABASE: 02/26/2021: CABG x4 (LIMA to LAD, SVG to DIAGONAL, SVG to OM, SVG to PLB) + MAZE + 45 mm atrial clip placement.  EKG: 01/26/2021: Atrial fibrillation with rapid ventricular rate 126 bpm, left axis deviation, left anterior fascicular block, left bundle branch block,  consider old inferior infarct.  05/19/2021: Atrial fibrillation, 110 bpm, left axis, LBBB.  Echocardiogram: 02/05/2021:  Severely depressed LV systolic function with visual EF 25-30%. Hypokinetic global wall motion with regionality involving septal wall. Left ventricle cavity is mildly dilated. Moderate left ventricular hypertrophy. Unable to evaluate diastolic function due to atrial fibrillation. Elevated LAP.  Moderate (Grade II) mitral regurgitation.  Moderate tricuspid regurgitation. Mild pulmonary hypertension, RVSP 16mmHg.  IVC is dilated with a respiratory response of <50%.  No prior study for comparison.  02/25/2021:  1. Left ventricular ejection fraction, by estimation, is 30 to 35%. The left ventricle has moderately decreased function. The left ventricle demonstrates global hypokinesis. There is moderate left ventricular hypertrophy. Left ventricular diastolic function could not be evaluated.   2. Right ventricular systolic function is normal. The right ventricular size is mildly enlarged. There is normal pulmonary artery systolic pressure.   3. Left atrial size was severely dilated.   4.  Right atrial size was dilated.   5. The mitral valve is normal in structure. Mild mitral valve regurgitation. No evidence of mitral stenosis.   6. The aortic valve is tricuspid. Aortic valve regurgitation is not visualized.   7. There is mild dilatation of the ascending aorta, measuring 40 mm.   Stress Testing: MPI 02/12/2016: No diagnostic ST segment changes to indicate ischemia. No significant arrhythmias. Small, mild intensity, partially reversible mid to basal inferior defect suggestive of variable soft tissue attenuation. No large ischemic zones are noted. This is a low risk study. Nuclear stress EF: 55%.  Heart Catheterization: Left and right heart catheterization 02/23/2021 LM: Normal LAD: Prox 80% stenosius at bifurcation with small to medium caliber D1 with 75% long prox disease. Mid  70% disease Ramus: Minimal luminal irregularities Lcx: OM1 with prox 80% stenosis RCA: 60-70% disease in PL branches   RA: 2 mmHg RV: 22/0 mmHg PA: 21/5 mmHg, mPAP 13 mmHg PCW: 8 mmHg   CO: 5.6 L/min CI: 2.5 L/min/m2     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%, diabetic patient CVTS consult for CABG  LABORATORY DATA: CBC Latest Ref Rng & Units 05/12/2021 04/14/2021 03/31/2021  WBC 4.0 - 10.5 K/uL - 6.5 5.0  Hemoglobin 13.0 - 17.7 g/dL 13.5 14.1 13.6  Hematocrit 37.5 - 51.0 % 42.9 44.2 41.3  Platelets 150 - 400 K/uL - 378 292    CMP Latest Ref Rng & Units 05/12/2021 04/14/2021 03/31/2021  Glucose 70 - 99 mg/dL 189(H) 129(H) 185(H)  BUN 8 - 27 mg/dL 24 12 21   Creatinine 0.76 - 1.27 mg/dL 0.88 0.85 1.04  Sodium 134 - 144 mmol/L 138 135 134(L)  Potassium 3.5 - 5.2 mmol/L 4.4 4.3 4.6  Chloride 96 - 106 mmol/L 100 101 100  CO2 20 - 29 mmol/L 23 23 23   Calcium 8.6 - 10.2 mg/dL 10.0 9.6 9.4  Total Protein 6.5 - 8.1 g/dL - - -  Total Bilirubin 0.3 - 1.2 mg/dL - - -  Alkaline Phos 38 - 126 U/L - - -  AST 15 - 41 U/L - - -  ALT 0 - 44 U/L - - -    Lipid Panel     Component Value Date/Time   CHOL 99 02/22/2021 0131   TRIG 56 02/22/2021 0131   HDL 44 02/22/2021 0131   CHOLHDL 2.3 02/22/2021 0131   VLDL 11 02/22/2021 0131   LDLCALC 44 02/22/2021 0131    No components found for: NTPROBNP Recent Labs    02/05/21 1441 02/18/21 1534 03/23/21 1459 03/30/21 0957 05/12/21 1346  PROBNP 1,567* 2,355* 1,938* 1,722* 1,668*   Recent Labs    03/30/21 0958 04/15/21 0411 05/12/21 1346  TSH 6.220* 4.161 3.800    BMP Recent Labs    03/02/21 0126 03/23/21 1459 03/31/21 2048 04/14/21 1419 05/12/21 1346  NA 131*   < > 134* 135 138  K 4.4   < > 4.6 4.3 4.4  CL 102   < > 100 101 100  CO2 23   < > 23 23 23   GLUCOSE 174*   < > 185* 129* 189*  BUN 22   < > 21 12 24   CREATININE 0.76   < > 1.04 0.85 0.88  CALCIUM 8.8*   < > 9.4 9.6 10.0  GFRNONAA >60  --  >60 >60   --    < > = values in this interval not displayed.    HEMOGLOBIN A1C Lab Results  Component Value Date  HGBA1C 8.1 (H) 02/22/2021   MPG 185.77 02/22/2021    IMPRESSION:    ICD-10-CM   1. Chronic HFrEF (heart failure with reduced ejection fraction) (HCC)  I50.22 EKG 12-Lead    dapagliflozin propanediol (FARXIGA) 10 MG TABS tablet    2. Persistent atrial fibrillation (HCC)  I48.19     3. S/P Maze operation for atrial fibrillation  Z98.890    Z86.79     4. Long term (current) use of anticoagulants  Z79.01     5. Long term current use of antiarrhythmic drug  Z79.899     6. Atherosclerosis of native coronary artery of native heart without angina pectoris  I25.10     7. S/P CABG x 4  Z95.1     8. LBBB (left bundle branch block)  I44.7     9. Ischemic cardiomyopathy  I25.5     10. Type 2 diabetes mellitus with microalbuminuria, without long-term current use of insulin (HCC)  E11.29    R80.9     11. Mixed hyperlipidemia  E78.2     12. Benign hypertension  I10     13. Sleep apnea, unspecified type  G47.30         RECOMMENDATIONS: Haron Ray Grover is a 76 y.o. male whose past medical history and cardiac risk factors include: Ischemic cardiomyopathy, acute/chronic heart failure with reduced EF, hypertension, diabetes mellitus with microalbuminuria, erectile dysfunction, heartburn, former smoker, hyperlipidemia, sleep apnea not on CPAP, persistent atrial fibrillation status post Maze procedure, advanced age.  Chronic HFrEF (heart failure with reduced ejection fraction)/ischemic cardiomyopathy (HCC) Stage C, NYHA class II Medications reconciled. Echo 02/25/2021: LVEF 30-35% Weight remains stable since last office visit. His Entresto has been uptitrated to 97/103 mg p.o. twice daily. Patient requesting refills for Farxiga. Will hold off on addition of spironolactone at this time as his blood pressures are lower limit of normal.  And he already feels tired and fatigued.  We  will continue to monitor. NT proBNP trending downward. Recommend daily weight check, strict I/O's Fluid restriction to <2L per day, Na restriction < 1.5g per day.  Persistent atrial fibrillation (HCC) Rate control: Carvedilol. Rhythm control: Amiodarone. Thromboembolic prophylaxis: Eliquis CHA2DS2-VASc SCORE is 5 which correlates to 6.7% risk of stroke per year. I suspect that his underlying generalized tired and fatigue may be secondary to hypothyroidism and underlying atrial fibrillation.  I have asked him to follow-up with his PCP with regards to the management of hypothyroidism.  Also discussed with the patient and his daughter [name] today's visit with regards to proceeding with TEE guided cardioversion to restore normal sinus rhythm.  After careful review of history and examination, the risks, benefits of transesophageal echocardiogram, and alternatives have been explained to the patient and daughter [name]. Complications include but not limited to esophageal perforation (rare), gastrointestinal bleeding (rare), cardiac arrhythmia which can include cardiac arrest and death (rare), pharyngeal irritation / discomfort with swallowing / hematoma, methemoglobinemia, bronchospasm, transient hypoxia, nonsustained ventricular tachycardia, transient atrial fibrillation, minimal hemoptysis, vomiting, hypotension, respiratory compromise, reaction to medications, unavoidable damage to teeth and gums, aspiration pneumonia  were reviewed with the patient.  Patient voices understands, provides verbal feedback, questions answered, and patient and daughter [name] wishes to proceed with the procedure.  Risks, benefits, and alternatives of direct current cardioversion reviewed with the and patient and his daughter [name]. Risk includes but not limited to: potential for post-cardioversion rhythms, life-threatening arrhythmias (ventricular tachycardia and fibrillation, profound bradycardia, cardiac arrest),  myocardial damage, acute pulmonary edema, skin  burns, transient hypotension. Benefits include restoration of sinus rhythm. Alternatives to treatment were discussed, questions were answered, patient and his daughter voices understanding and provides verbal feedback.  Patient is willing to proceed.   Long term (current) use of anticoagulant & S/P Maze for atrial fibrillation, 41mm LAA clip.  Indication: Persistent atrial fibrillation. Patient did restore normal sinus rhythm shortly after his bypass surgery went back into atrial fibrillation. Reemphasized the risks, benefits, and alternatives to oral anticoagulation. Patient does not endorse any evidence of bleeding. Most recent hemoglobin 13.5 g/dL as of December 2022 We will proceed with TEE guided cardioversion as discussed above.  Atherosclerosis of native coronary artery of native heart, four-vessel bypass, without angina pectoris Denies angina pectoris. No use of sublingual nitroglycerin tablets. Once cleared by cardiothoracic surgery patient will be enrolled into cardiac rehab.  S/P CABG x 4 See above. Educated on the importance of improving his modifiable cardiovascular risk factors.  Type 2 diabetes mellitus with microalbuminuria, without long-term current use of insulin ([place]) Continue Bertell Maria, Farxiga, statin therapy Educated on the importance of glycemic control.  Mixed hyperlipidemia Currently on Crestor.   He denies myalgia or other side effects. Most recent lipids dated 02/2021 reviewed as noted above.  LDL currently at goal  Benign hypertension Office blood pressures within acceptable range. Reemphasized importance of low-salt diet. Medications reconciled.  FINAL MEDICATION LIST END OF ENCOUNTER: Meds ordered this encounter  Medications   dapagliflozin propanediol (FARXIGA) 10 MG TABS tablet    Sig: Take 1 tablet (10 mg total) by mouth daily.    Dispense:  90 tablet    Refill:  0   Medications Discontinued During  This Encounter  Medication Reason   barrier cream (NON-SPECIFIED) CREA    dapagliflozin propanediol (FARXIGA) 10 MG TABS tablet Reorder      Current Outpatient Medications:    acetaminophen (TYLENOL) 500 MG tablet, Take 1,000 mg by mouth every 6 (six) hours as needed for moderate pain or mild pain., Disp: , Rfl:    ALPRAZolam (XANAX) 0.5 MG tablet, Take 0.5 mg by mouth at bedtime as needed for sleep., Disp: , Rfl:    amiodarone (PACERONE) 100 MG tablet, Take 1 tablet (100 mg total) by mouth daily., Disp: , Rfl:    apixaban (ELIQUIS) 5 MG TABS tablet, Take 1 tablet (5 mg total) by mouth 2 (two) times daily., Disp: 180 tablet, Rfl: 0   aspirin EC 81 MG tablet, Take 81 mg by mouth daily., Disp: , Rfl:    benzonatate (TESSALON) 200 MG capsule, Take 200 mg by mouth 3 (three) times daily as needed for cough., Disp: , Rfl:    famotidine (PEPCID) 40 MG tablet, Take 40 mg by mouth at bedtime., Disp: , Rfl:    feeding supplement (ENSURE ENLIVE / ENSURE PLUS) LIQD, Take 237 mLs by mouth 2 (two) times daily between meals. Until oral intake improves, Disp: 237 mL, Rfl: 12   furosemide (LASIX) 40 MG tablet, Take 1 tablet (40 mg total) by mouth daily., Disp: 30 tablet, Rfl: 0   glipiZIDE (GLUCOTROL XL) 10 MG 24 hr tablet, Take 10 mg by mouth 2 (two) times daily., Disp: , Rfl:    Magnesium Oxide 400 MG CAPS, Take 1 capsule (400 mg total) by mouth daily., Disp: 90 capsule, Rfl: 0   metFORMIN (GLUCOPHAGE) 1000 MG tablet, Take 1,000 mg by mouth daily with breakfast., Disp: , Rfl:    metoprolol succinate (TOPROL-XL) 25 MG 24 hr tablet, Take 1 tablet (25 mg total)  by mouth daily., Disp: 90 tablet, Rfl: 0   pantoprazole (PROTONIX) 40 MG tablet, Take 40 mg by mouth every evening., Disp: , Rfl:    potassium chloride (KLOR-CON M) 10 MEQ tablet, Take 1 tablet (10 mEq total) by mouth daily., Disp: 30 tablet, Rfl: 0   rosuvastatin (CRESTOR) 5 MG tablet, Take 5 mg by mouth at bedtime., Disp: , Rfl:     sacubitril-valsartan (ENTRESTO) 97-103 MG, Take 1 tablet by mouth 2 (two) times daily., Disp: 180 tablet, Rfl: 1   traMADol (ULTRAM) 50 MG tablet, Take 1 tablet (50 mg total) by mouth every 4 (four) hours as needed for moderate pain., Disp: 30 tablet, Rfl: 0   dapagliflozin propanediol (FARXIGA) 10 MG TABS tablet, Take 1 tablet (10 mg total) by mouth daily., Disp: 90 tablet, Rfl: 0  Orders Placed This Encounter  Procedures   EKG 12-Lead   There are no Patient Instructions on file for this visit.   --Continue cardiac medications as reconciled in final medication list. --Return in about 6 weeks (around 06/30/2021) for Follow up, A. fib, s/p TEE cardioversion, and EKG on arrival. . Or sooner if needed. --Continue follow-up with your primary care physician regarding the management of your other chronic comorbid conditions.  Patient's questions and concerns were addressed to his satisfaction. He voices understanding of the instructions provided during this encounter.   This note was created using a voice recognition software as a result there may be grammatical errors inadvertently enclosed that do not reflect the nature of this encounter. Every attempt is made to correct such errors.  Rex Kras, [place], [facility]  Pager: [phone] Office: [phone]

## 2021-05-19 NOTE — Patient Instructions (Signed)
Continue to observe sternal precautions.  Continue to monitor and record blood pressures daily.  Would hold off on beginning cardiac rehab until after cardioversion.  No change in medications from CT surgery standpoint

## 2021-05-19 NOTE — Progress Notes (Signed)
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  Patient returns for routine postoperative follow-up having undergone CABG x4 and a Maze procedure by Dr. Kipp Brood on 02/26/2021. The patient's early postoperative recovery while in the hospital was notable for recurrent atrial fibrillation treated with amiodarone and apixaban.  He was in atrial fibrillation at the time of discharge (on 03/04/2021) with controlled ventricular rate.  He was followed closely post discharge by both the cardiology service as well as our practice.  During an office visit on 04/01/2019 he was noted to have right lower extremity cellulitis.  He was admitted to the hospital and started on IV vancomycin and later transitioned to oral Keflex.  Cellulitis showed evidence of resolution very quickly so he was discharged back to home on 04/02/2021 on oral antibiotics.  Since that admission, he has been seen in the emergency room on 04/14/2021 for chest pain.  He ruled out for acute coronary syndrome.  The records show his medications were reviewed with cardiology and adjustments were made.  Since that visit to the emergency room, [name] reports he continues to have some weakness.  He denies any chest pain or shortness of breath.  He uses a walker for ambulation but is able to stand and transfer independently.  [name] returned today for scheduled wound check and for clearance to begin cardiac rehab  Current Outpatient Medications  Medication Sig Dispense Refill   acetaminophen (TYLENOL) 500 MG tablet Take 1,000 mg by mouth every 6 (six) hours as needed for moderate pain or mild pain.     ALPRAZolam (XANAX) 0.5 MG tablet Take 0.5 mg by mouth at  bedtime as needed for sleep.     amiodarone (PACERONE) 100 MG tablet Take 1 tablet (100 mg total) by mouth daily.     apixaban (ELIQUIS) 5 MG TABS tablet Take 1 tablet (5 mg total) by mouth 2 (two) times daily. 180 tablet 0   aspirin EC 81 MG tablet Take 81 mg by mouth daily.     benzonatate (TESSALON) 200 MG capsule Take 200 mg by mouth 3 (three) times daily as needed for cough.     dapagliflozin propanediol (FARXIGA) 10 MG TABS tablet Take 1 tablet (10 mg total) by mouth daily. 90 tablet 0   famotidine (PEPCID) 40 MG tablet Take 40 mg by mouth at bedtime.     feeding supplement (ENSURE ENLIVE / ENSURE PLUS) LIQD Take 237 mLs by mouth 2 (two) times daily between meals. Until oral intake improves 237 mL 12   furosemide (LASIX) 40 MG tablet Take 1 tablet (40 mg total) by mouth daily. 30 tablet 0   glipiZIDE (GLUCOTROL XL) 10 MG 24 hr tablet Take 10 mg by mouth 2 (two) times daily.     Magnesium Oxide 400 MG CAPS Take 1 capsule (400 mg total) by mouth daily. 90 capsule 0   metFORMIN (GLUCOPHAGE) 1000 MG tablet Take 1,000 mg by mouth daily with breakfast.     metoprolol succinate (TOPROL-XL) 25 MG 24 hr tablet Take 1 tablet (25 mg total) by mouth daily. 90 tablet 0   pantoprazole (PROTONIX) 40 MG tablet Take 40  mg by mouth every evening.     potassium chloride (KLOR-CON M) 10 MEQ tablet Take 1 tablet (10 mEq total) by mouth daily. 30 tablet 0   rosuvastatin (CRESTOR) 5 MG tablet Take 5 mg by mouth at bedtime.     sacubitril-valsartan (ENTRESTO) 97-103 MG Take 1 tablet by mouth 2 (two) times daily. 180 tablet 1   traMADol (ULTRAM) 50 MG tablet Take 1 tablet (50 mg total) by mouth every 4 (four) hours as needed for moderate pain. 30 tablet 0   No current facility-administered medications for this visit.    Physical Exam  Vital signs BP 80/55 Pulse 72, irregular Respirations 20 SPO2 92% on room air  Heart: Irregularly irregular rhythm.  No murmur. Chest: Breath sounds are clear to  auscultation, full and equal.  The sternotomy incision is well-healed and the sternum is stable. Extremities: No peripheral edema.  There is a palpable cord along the [facility] tunnel in his right lower extremity.  There is mild erythema in the left lower leg extending down to the ankle.  There is no tenderness.  There is no evidence of wound breakdown or drainage.  Diagnostic Tests: CLINICAL DATA:  Hx CABG 02/2021, A fib, some sob, no smoke   EXAM: CHEST - 2 VIEW   COMPARISON:  04/14/2021   FINDINGS: Small / moderate left pleural effusion persists. Adjacent atelectasis at the left lung base. Right lung clear.   Heart size and mediastinal contours are within normal limits. Atrial appendage clip. CABG markers. Aortic Atherosclerosis (ICD10-170.0).   No pneumothorax.   Sternotomy wires.   IMPRESSION: Persistent left pleural effusion.     Electronically Signed   By: Lucrezia Europe M.D.   On: 05/19/2021 14:01  Impression / Plan: 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  He is being followed closely by the heart failure team.  He was most recently seen there this morning.  Cardioversion later this month is planned for his persistent atrial fibrillation.  His postoperative course was complicated by right lower extremity cellulitis resulting in readmission to the hospital for IV antibiotic therapy.  His right lower extremity is stable with some residual erythema in the lower leg and ankle.  This does not appear to be acutely infected but I would prefer to see him again in about 2 weeks just to assure continued resolution.  I concur that his persistent weakness is at least partially related to his ongoing atrial fibrillation.  His blood pressure was relatively low in our office although it was normal when measured at the heart failure clinic earlier today.  I asked Mr. [name] to keep a close watch on  his blood pressure and record readings daily as he is already been doing.  I recommend holding off on cardiac rehab until we can see him again in the office for another wound check and also for chest x-ray to follow-up on his left pleural effusion in 2 weeks.  I suggested to him that he may want to wait until after cardioversion to begin the rehab program.     Antony Odea, PA-C [facility] [phone]

## 2021-05-20 ENCOUNTER — Encounter: Payer: Self-pay | Admitting: Cardiology

## 2021-05-21 DIAGNOSIS — I739 Peripheral vascular disease, unspecified: Secondary | ICD-10-CM | POA: Diagnosis not present

## 2021-05-21 DIAGNOSIS — I10 Essential (primary) hypertension: Secondary | ICD-10-CM | POA: Diagnosis not present

## 2021-05-21 DIAGNOSIS — I429 Cardiomyopathy, unspecified: Secondary | ICD-10-CM | POA: Diagnosis not present

## 2021-05-21 DIAGNOSIS — G4730 Sleep apnea, unspecified: Secondary | ICD-10-CM | POA: Diagnosis not present

## 2021-05-21 DIAGNOSIS — Z299 Encounter for prophylactic measures, unspecified: Secondary | ICD-10-CM | POA: Diagnosis not present

## 2021-05-21 DIAGNOSIS — E1165 Type 2 diabetes mellitus with hyperglycemia: Secondary | ICD-10-CM | POA: Diagnosis not present

## 2021-05-26 DIAGNOSIS — I5022 Chronic systolic (congestive) heart failure: Secondary | ICD-10-CM | POA: Diagnosis not present

## 2021-05-26 DIAGNOSIS — I25118 Atherosclerotic heart disease of native coronary artery with other forms of angina pectoris: Secondary | ICD-10-CM | POA: Diagnosis not present

## 2021-05-26 DIAGNOSIS — I110 Hypertensive heart disease with heart failure: Secondary | ICD-10-CM | POA: Diagnosis not present

## 2021-05-26 DIAGNOSIS — I482 Chronic atrial fibrillation, unspecified: Secondary | ICD-10-CM | POA: Diagnosis not present

## 2021-05-28 ENCOUNTER — Other Ambulatory Visit: Payer: Self-pay | Admitting: Cardiology

## 2021-05-28 DIAGNOSIS — I4819 Other persistent atrial fibrillation: Secondary | ICD-10-CM

## 2021-05-28 MED ORDER — FUROSEMIDE 40 MG PO TABS: 40.0000 mg | ORAL_TABLET | Freq: Every day | ORAL | 0 refills | Status: DC

## 2021-05-28 MED ORDER — POTASSIUM CHLORIDE CRYS ER 10 MEQ PO TBCR: 10.0000 meq | EXTENDED_RELEASE_TABLET | Freq: Every day | ORAL | 0 refills | Status: DC

## 2021-05-28 MED ORDER — AMIODARONE HCL 100 MG PO TABS: 100.0000 mg | ORAL_TABLET | Freq: Every day | ORAL | Status: DC

## 2021-05-31 ENCOUNTER — Encounter (HOSPITAL_COMMUNITY): Payer: Self-pay | Admitting: Cardiology

## 2021-06-02 ENCOUNTER — Encounter (HOSPITAL_COMMUNITY): Payer: Self-pay | Admitting: Cardiology

## 2021-06-02 ENCOUNTER — Other Ambulatory Visit: Payer: Self-pay | Admitting: Surgery

## 2021-06-02 DIAGNOSIS — J329 Chronic sinusitis, unspecified: Secondary | ICD-10-CM | POA: Diagnosis not present

## 2021-06-02 DIAGNOSIS — Z951 Presence of aortocoronary bypass graft: Secondary | ICD-10-CM

## 2021-06-02 DIAGNOSIS — I10 Essential (primary) hypertension: Secondary | ICD-10-CM | POA: Diagnosis not present

## 2021-06-02 DIAGNOSIS — Z299 Encounter for prophylactic measures, unspecified: Secondary | ICD-10-CM | POA: Diagnosis not present

## 2021-06-02 DIAGNOSIS — J069 Acute upper respiratory infection, unspecified: Secondary | ICD-10-CM | POA: Diagnosis not present

## 2021-06-02 DIAGNOSIS — E1165 Type 2 diabetes mellitus with hyperglycemia: Secondary | ICD-10-CM | POA: Diagnosis not present

## 2021-06-03 ENCOUNTER — Ambulatory Visit (INDEPENDENT_AMBULATORY_CARE_PROVIDER_SITE_OTHER): Payer: Medicare PPO | Admitting: Physician Assistant

## 2021-06-03 ENCOUNTER — Other Ambulatory Visit: Payer: Self-pay

## 2021-06-03 ENCOUNTER — Other Ambulatory Visit: Payer: Self-pay | Admitting: *Deleted

## 2021-06-03 ENCOUNTER — Ambulatory Visit
Admission: RE | Admit: 2021-06-03 | Discharge: 2021-06-03 | Disposition: A | Discharge status: Home / Self Care | Payer: Medicare PPO | Source: Ambulatory Visit | Attending: Surgery | Admitting: Surgery

## 2021-06-03 VITALS — BP 103/70 | HR 84 | Resp 20 | Ht 78.0 in | Wt 188.0 lb

## 2021-06-03 DIAGNOSIS — Z951 Presence of aortocoronary bypass graft: Secondary | ICD-10-CM

## 2021-06-03 DIAGNOSIS — Z5189 Encounter for other specified aftercare: Secondary | ICD-10-CM | POA: Diagnosis not present

## 2021-06-03 DIAGNOSIS — J90 Pleural effusion, not elsewhere classified: Secondary | ICD-10-CM | POA: Diagnosis not present

## 2021-06-03 NOTE — Patient Instructions (Signed)
No change in medications from cardiac surgery standpoint.  May advance activity as tolerated without limitation.  May participate in cardiac rehab.  Follow-up as needed.

## 2021-06-03 NOTE — Progress Notes (Signed)
Derek Bond       [place] [address]             [phone]    HPI:  Mr. 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   Patient returns for routine postoperative follow-up having undergone CABG x4 and a Maze procedure by Dr. Kipp Brood on 02/26/2021. The patient's early postoperative recovery while in the hospital was notable for recurrent atrial fibrillation treated with amiodarone and apixaban.  He was in atrial fibrillation at the time of discharge (on 03/04/2021) with controlled ventricular rate.  He was followed closely post discharge by both the cardiology service as well as our practice.  During an office visit on 04/01/2019 he was noted to have right lower extremity cellulitis.  He was admitted to the hospital and started on IV vancomycin and later transitioned to oral Keflex.  Cellulitis showed evidence of resolution very quickly so he was discharged back to home on 04/02/2021 on oral antibiotics.  Since that admission, he has been seen in the emergency room on 04/14/2021 for chest pain.  He ruled out for acute coronary syndrome.  The records show his medications were reviewed with cardiology and adjustments were made.   Since that visit to the emergency room, Mr. Derek Bond reports he continues to have some weakness.  He denies any chest pain or shortness of breath.  He uses a walker for ambulation but is able to stand and transfer independently.   Mr. Derek Bond was asked to returned today for another wound check, for follow up of his pleural effusion, and for clearance to begin cardiac rehab.  Derek Bond said he has continued to progress slowly.  He continues to have some shortness of breath with activity that he believes is due to his persistent atrial fibrillation.  He is scheduled for  cardioversion by the cardiology team next week.     Current Outpatient Medications  Medication Sig Dispense Refill   acetaminophen (TYLENOL) 500 MG tablet Take 1,000 mg by mouth every 6 (six) hours as needed for moderate pain or mild pain.     ALPRAZolam (XANAX) 0.5 MG tablet Take 0.5 mg by mouth at bedtime as needed for sleep.     amiodarone (PACERONE) 100 MG tablet Take 1 tablet (100 mg total) by mouth daily.     amoxicillin (AMOXIL) 500 MG capsule Take 500 mg by mouth 3 (three) times daily.     apixaban (ELIQUIS) 5 MG TABS tablet Take 1 tablet (5 mg total) by mouth 2 (two) times daily. 180 tablet 0   aspirin EC 81 MG tablet Take 81 mg by mouth daily.     benzonatate (TESSALON) 200 MG capsule Take 200 mg by mouth 3 (three) times daily as needed for cough.     dapagliflozin propanediol (FARXIGA) 10 MG TABS tablet Take 1 tablet (10 mg total) by mouth daily. 90 tablet 0   famotidine (PEPCID) 40 MG tablet Take 40 mg by mouth at bedtime.     feeding supplement (ENSURE ENLIVE / ENSURE PLUS) LIQD Take 237 mLs by mouth 2 (two) times daily between meals. Until oral intake improves 237 mL 12   furosemide (LASIX) 40 MG tablet Take 1 tablet (40 mg total) by mouth daily. 30 tablet 0   glipiZIDE (GLUCOTROL XL) 10 MG 24 hr tablet Take 10 mg  by mouth 2 (two) times daily.     Magnesium Oxide 400 MG CAPS Take 1 capsule (400 mg total) by mouth daily. 90 capsule 0   metFORMIN (GLUCOPHAGE) 1000 MG tablet Take 1,000 mg by mouth daily with breakfast.     metoprolol succinate (TOPROL-XL) 25 MG 24 hr tablet Take 1 tablet (25 mg total) by mouth daily. 90 tablet 0   pantoprazole (PROTONIX) 40 MG tablet Take 40 mg by mouth every evening.     potassium chloride (KLOR-CON M) 10 MEQ tablet Take 1 tablet (10 mEq total) by mouth daily. 30 tablet 0   rosuvastatin (CRESTOR) 5 MG tablet Take 5 mg by mouth at bedtime.     sacubitril-valsartan (ENTRESTO) 97-103 MG Take 1 tablet by mouth 2 (two) times daily. 180 tablet 1    traMADol (ULTRAM) 50 MG tablet Take 1 tablet (50 mg total) by mouth every 4 (four) hours as needed for moderate pain. 30 tablet 0   No current facility-administered medications for this visit.    Physical Exam Vital Signs BP: 103/70 HR: 84 RR: 20 SpO2 96%  Heart: Irregularly irregular rhythm consistent with his known atrial fibrillation.  Rate is controlled. Chest: Breath sounds shallow but clear to auscultation.  Follow-up chest x-ray from today was reviewed and shows continued resolution of the left pleural effusion.  Lung fields are otherwise clear. Extremities: No cellulitis is completely resolved.  Continues to have a palpable cord along the tunnel in his right leg but this is also improved significantly since his last visit 2 weeks ago.  Diagnostic Tests:  CLINICAL DATA:  Status post CABG.   EXAM: CHEST - 2 VIEW   COMPARISON:  Chest x-ray 05/19/2021.   FINDINGS: Patient is status post cardiac surgery. There is a stable small left pleural effusion. The lungs are otherwise clear. There is no pneumothorax. The cardiomediastinal silhouette is within normal limits. No acute fractures are seen.   IMPRESSION: 1. Stable small left pleural effusion. 2. No new focal lung infiltrate.     Electronically Signed   By: Ronney Asters M.D.   On: 06/03/2021 15:20  Impression / Plan:  Mr. Derek Bond is status post CABG x4 and maze procedure by Dr. Kipp Brood 3 months ago.  He continues to have some weakness and shortness of breath daily.  He has had persistent atrial fibrillation with the rate well controlled with metoprolol succinate 25 mg p.o. daily and amiodarone 100 mg p.o. daily.  Cardioversion is scheduled next week by his cardiologist and he is hopeful that this will improve the symptoms.  The cellulitis in his right lower extremity has resolved.  The left pleural effusion is also nearly completely resolved. I see no reason to schedule any further follow-up with our office but offered  to see Derek Bond at anytime should any problem arise related to his injury.  Referral was made to cardiac rehab which he would like to begin after his cardioversion.   Derek Odea, PA-C [facility] [phone]

## 2021-06-10 ENCOUNTER — Ambulatory Visit (HOSPITAL_COMMUNITY)
Admission: RE | Admit: 2021-06-10 | Discharge: 2021-06-10 | Disposition: A | Discharge status: Home / Self Care | Payer: Medicare PPO | Attending: Cardiology | Admitting: Cardiology

## 2021-06-10 ENCOUNTER — Ambulatory Visit (HOSPITAL_COMMUNITY): Payer: Medicare PPO | Admitting: Anesthesiology

## 2021-06-10 ENCOUNTER — Other Ambulatory Visit: Payer: Self-pay

## 2021-06-10 ENCOUNTER — Ambulatory Visit (HOSPITAL_COMMUNITY): Payer: Medicare PPO

## 2021-06-10 ENCOUNTER — Encounter (HOSPITAL_COMMUNITY): Payer: Self-pay | Admitting: Cardiology

## 2021-06-10 ENCOUNTER — Encounter (HOSPITAL_COMMUNITY)
Admission: RE | Disposition: A | Discharge status: Home / Self Care | Payer: Self-pay | Source: Home / Self Care | Attending: Cardiology

## 2021-06-10 DIAGNOSIS — G4730 Sleep apnea, unspecified: Secondary | ICD-10-CM | POA: Insufficient documentation

## 2021-06-10 DIAGNOSIS — Z951 Presence of aortocoronary bypass graft: Secondary | ICD-10-CM | POA: Diagnosis not present

## 2021-06-10 DIAGNOSIS — I2510 Atherosclerotic heart disease of native coronary artery without angina pectoris: Secondary | ICD-10-CM | POA: Diagnosis not present

## 2021-06-10 DIAGNOSIS — Z87891 Personal history of nicotine dependence: Secondary | ICD-10-CM | POA: Insufficient documentation

## 2021-06-10 DIAGNOSIS — R809 Proteinuria, unspecified: Secondary | ICD-10-CM | POA: Diagnosis not present

## 2021-06-10 DIAGNOSIS — I5022 Chronic systolic (congestive) heart failure: Secondary | ICD-10-CM | POA: Diagnosis not present

## 2021-06-10 DIAGNOSIS — I110 Hypertensive heart disease with heart failure: Secondary | ICD-10-CM | POA: Insufficient documentation

## 2021-06-10 DIAGNOSIS — E1129 Type 2 diabetes mellitus with other diabetic kidney complication: Secondary | ICD-10-CM | POA: Insufficient documentation

## 2021-06-10 DIAGNOSIS — I447 Left bundle-branch block, unspecified: Secondary | ICD-10-CM | POA: Insufficient documentation

## 2021-06-10 DIAGNOSIS — I255 Ischemic cardiomyopathy: Secondary | ICD-10-CM | POA: Diagnosis not present

## 2021-06-10 DIAGNOSIS — I4819 Other persistent atrial fibrillation: Secondary | ICD-10-CM | POA: Insufficient documentation

## 2021-06-10 DIAGNOSIS — I4891 Unspecified atrial fibrillation: Secondary | ICD-10-CM

## 2021-06-10 DIAGNOSIS — Z79899 Other long term (current) drug therapy: Secondary | ICD-10-CM | POA: Diagnosis not present

## 2021-06-10 DIAGNOSIS — E119 Type 2 diabetes mellitus without complications: Secondary | ICD-10-CM | POA: Diagnosis not present

## 2021-06-10 DIAGNOSIS — I10 Essential (primary) hypertension: Secondary | ICD-10-CM | POA: Diagnosis not present

## 2021-06-10 DIAGNOSIS — Z7984 Long term (current) use of oral hypoglycemic drugs: Secondary | ICD-10-CM | POA: Insufficient documentation

## 2021-06-10 DIAGNOSIS — I700 Atherosclerosis of aorta: Secondary | ICD-10-CM | POA: Insufficient documentation

## 2021-06-10 DIAGNOSIS — Z7901 Long term (current) use of anticoagulants: Secondary | ICD-10-CM | POA: Diagnosis not present

## 2021-06-10 DIAGNOSIS — E782 Mixed hyperlipidemia: Secondary | ICD-10-CM | POA: Insufficient documentation

## 2021-06-10 DIAGNOSIS — Z794 Long term (current) use of insulin: Secondary | ICD-10-CM | POA: Diagnosis not present

## 2021-06-10 HISTORY — PX: TEE WITHOUT CARDIOVERSION: SHX5443

## 2021-06-10 HISTORY — PX: CARDIOVERSION: SHX1299

## 2021-06-10 LAB — POCT I-STAT, CHEM 8
BUN: 23 mg/dL (ref 8–23)
Calcium, Ion: 1.28 mmol/L (ref 1.15–1.40)
Chloride: 104 mmol/L (ref 98–111)
Creatinine, Ser: 0.7 mg/dL (ref 0.61–1.24)
Glucose, Bld: 138 mg/dL — ABNORMAL HIGH (ref 70–99)
HCT: 48 % (ref 39.0–52.0)
Hemoglobin: 16.3 g/dL (ref 13.0–17.0)
Potassium: 4.5 mmol/L (ref 3.5–5.1)
Sodium: 139 mmol/L (ref 135–145)
TCO2: 25 mmol/L (ref 22–32)

## 2021-06-10 LAB — ECHO TEE
AV Mean grad: 8 mmHg
AV Peak grad: 13.1 mmHg
Ao pk vel: 1.81 m/s

## 2021-06-10 LAB — GLUCOSE, CAPILLARY: Glucose-Capillary: 125 mg/dL — ABNORMAL HIGH (ref 70–99)

## 2021-06-10 SURGERY — ECHOCARDIOGRAM, TRANSESOPHAGEAL
Anesthesia: General

## 2021-06-10 MED ORDER — PHENYLEPHRINE 40 MCG/ML (10ML) SYRINGE FOR IV PUSH (FOR BLOOD PRESSURE SUPPORT)
PREFILLED_SYRINGE | INTRAVENOUS | Status: DC | PRN
  Administered 2021-06-10: 80 ug via INTRAVENOUS
  Administered 2021-06-10: 120 ug via INTRAVENOUS
  Administered 2021-06-10: 80 ug via INTRAVENOUS

## 2021-06-10 MED ORDER — PROPOFOL 500 MG/50ML IV EMUL
INTRAVENOUS | Status: DC | PRN
  Administered 2021-06-10: 100 ug/kg/min via INTRAVENOUS

## 2021-06-10 MED ORDER — PROPOFOL 10 MG/ML IV BOLUS
INTRAVENOUS | Status: DC | PRN
  Administered 2021-06-10: 10 mg via INTRAVENOUS

## 2021-06-10 MED ORDER — SODIUM CHLORIDE 0.9 % IV SOLN: INTRAVENOUS | Status: DC

## 2021-06-10 MED ORDER — LIDOCAINE 2% (20 MG/ML) 5 ML SYRINGE
INTRAMUSCULAR | Status: DC | PRN
  Administered 2021-06-10: 80 mg via INTRAVENOUS

## 2021-06-10 NOTE — Transfer of Care (Signed)
Immediate Anesthesia Transfer of Care Note  Patient: Derek Bond  Procedure(s) Performed: TRANSESOPHAGEAL ECHOCARDIOGRAM (TEE) CARDIOVERSION  Patient Location: PACU  Anesthesia Type:General  Level of Consciousness: drowsy and patient cooperative  Airway & Oxygen Therapy: Patient Spontanous Breathing and Patient connected to face mask oxygen  Post-op Assessment: Report given to RN and Post -op Vital signs reviewed and stable  Post vital signs: Reviewed and stable  Last Vitals:  Vitals Value Taken Time  BP 114/72   Temp    Pulse 62 06/10/21 1302  Resp 16 06/10/21 1302  SpO2 100 % 06/10/21 1302  Vitals shown include unvalidated device data.  Last Pain:  Vitals:   06/10/21 1140  TempSrc: Temporal  PainSc: 0-No pain         Complications: No notable events documented.

## 2021-06-10 NOTE — Progress Notes (Signed)
°  Echocardiogram Echocardiogram Transesophageal has been performed.  Derek Bond 06/10/2021, 1:34 PM

## 2021-06-10 NOTE — Anesthesia Preprocedure Evaluation (Addendum)
Anesthesia Evaluation  Patient identified by MRN, date of birth, ID band Patient awake    Reviewed: Allergy & Precautions, NPO status , Patient's Chart, lab work & pertinent test results, reviewed documented beta blocker date and time   History of Anesthesia Complications Negative for: history of anesthetic complications  Airway Mallampati: II  TM Distance: >3 FB Neck ROM: Full    Dental  (+) Dental Advisory Given,    Pulmonary sleep apnea , former smoker,    Pulmonary exam normal        Cardiovascular hypertension, Pt. on home beta blockers + CAD  + dysrhythmias Atrial Fibrillation  Rhythm:Irregular Rate:Tachycardia  Echo: 1. Left ventricular ejection fraction, by estimation, is 30 to 35%. The  left ventricle has moderately decreased function. The left ventricle  demonstrates global hypokinesis. There is moderate left ventricular  hypertrophy. Left ventricular diastolic  function could not be evaluated.  2. Right ventricular systolic function is normal. The right ventricular  size is mildly enlarged. There is normal pulmonary artery systolic  pressure.  3. Left atrial size was severely dilated.  4. Right atrial size was dilated.  5. The mitral valve is normal in structure. Mild mitral valve  regurgitation. No evidence of mitral stenosis.  6. The aortic valve is tricuspid. Aortic valve regurgitation is not  visualized.  7. There is mild dilatation of the ascending aorta, measuring 40 mm.    Neuro/Psych negative neurological ROS  negative psych ROS   GI/Hepatic Neg liver ROS, hiatal hernia, GERD  Medicated,  Endo/Other  diabetes, Type 2, Insulin Dependent, Oral Hypoglycemic Agents  Renal/GU negative Renal ROS     Musculoskeletal negative musculoskeletal ROS (+)   Abdominal   Peds  Hematology negative hematology ROS (+)   Anesthesia Other Findings   Reproductive/Obstetrics                             Anesthesia Physical  Anesthesia Plan  ASA: 3  Anesthesia Plan: General   Post-op Pain Management:    Induction: Intravenous  PONV Risk Score and Plan: 2 and Ondansetron and TIVA  Airway Management Planned: Mask  Additional Equipment:   Intra-op Plan:   Post-operative Plan:   Informed Consent: I have reviewed the patients History and Physical, chart, labs and discussed the procedure including the risks, benefits and alternatives for the proposed anesthesia with the patient or authorized representative who has indicated his/her understanding and acceptance.     Dental advisory given  Plan Discussed with: Anesthesiologist and CRNA  Anesthesia Plan Comments:        Anesthesia Quick Evaluation

## 2021-06-10 NOTE — Anesthesia Postprocedure Evaluation (Signed)
Anesthesia Post Note  Patient: Derek Bond  Procedure(s) Performed: TRANSESOPHAGEAL ECHOCARDIOGRAM (TEE) CARDIOVERSION     Patient location during evaluation: Endoscopy Anesthesia Type: General Level of consciousness: awake and alert Pain management: pain level controlled Vital Signs Assessment: post-procedure vital signs reviewed and stable Respiratory status: spontaneous breathing and respiratory function stable Cardiovascular status: stable Postop Assessment: no apparent nausea or vomiting Anesthetic complications: no   No notable events documented.  Last Vitals:  Vitals:   06/10/21 1315 06/10/21 1330  BP: 99/67 109/68  Pulse: 63 63  Resp: (!) 21 (!) 22  Temp:  (!) 36.2 C  SpO2: 100% 92%    Last Pain:  Vitals:   06/10/21 1330  TempSrc:   PainSc: 0-No pain                 Mykal Batiz DANIEL

## 2021-06-10 NOTE — Interval H&P Note (Signed)
History and Physical Interval Note:  06/10/2021 12:22 PM  Derek Bond  has presented today for surgery, with the diagnosis of AFIB.  The various methods of treatment have been discussed with the patient and family. After consideration of risks, benefits and other options for treatment, the patient has consented to  Procedure(s): TRANSESOPHAGEAL ECHOCARDIOGRAM (TEE) (N/A) CARDIOVERSION (N/A) as a surgical intervention.  The patient's history has been reviewed, patient examined, no change in status, stable for surgery.  I have reviewed the patient's chart and labs.  Questions were answered to the patient's satisfaction.    After careful review of history and examination, the risks, benefits of transesophageal echocardiogram, and alternatives have been explained to the patient. Complications include but not limited to esophageal perforation (rare), gastrointestinal bleeding (rare), cardiac arrhythmia which can include cardiac arrest and death (rare), pharyngeal irritation / discomfort with swallowing / hematoma, methemoglobinemia, bronchospasm, transient hypoxia, nonsustained ventricular tachycardia, transient atrial fibrillation, minimal hemoptysis, vomiting, hypotension, respiratory compromise, reaction to medications, unavoidable damage to teeth and gums, aspiration pneumonia  were reviewed with the patient.  Patient voices understands, provides verbal feedback, questions answered, and patient wishes to proceed with the procedure.   Risks, benefits, and alternatives of direct current cardioversion reviewed with the and patient. Risk includes but not limited to: potential for post-cardioversion rhythms, life-threatening arrhythmias (ventricular tachycardia and fibrillation, profound bradycardia, cardiac arrest), myocardial damage, acute pulmonary edema, skin burns, transient hypotension. Benefits include restoration of sinus rhythm. Alternatives to treatment were discussed, questions were answered,  patient voices understanding and provides verbal feedback.  Patient is willing to proceed.   Rex Kras, [place], [facility]  Pager: [phone] Office: [phone]

## 2021-06-10 NOTE — Op Note (Signed)
Transesophageal echocardiogram (TEE) : Preliminary report 06/10/21  Sedation: See Anesthesia records.  TEE was performed without complications   LV: Visually 45-50%.  LA: Dilated.  Spontaneous echo contrast was present.  No thrombus present. Left atrial appendage: Not well seen. S/p surgical clip.  Inter atrial septum is intact without defect. Color doppler negative for PFO.  RA: Dilated.    MV: Mild regurgitation, no stenosis, no vegetation noted.  TV: Mild regurgitation,no stenosis, no vegetation noted. AV: no regurgitation, no stenosis, no vegetation noted.   PV: trivial regurgitation, no stenosis, no vegetation noted.   Thoracic and ascending aorta: Mild plaque.  Final report forthcoming.  Mechele Claude [facility]  Pager: [phone] Office: [phone]  Direct current cardioversion:  Indication symptomatic: Symptomatic atrial fibrillation.  Procedure Details:  Consent: Risks of procedure as well as the alternatives and risks of each were explained to the (patient/caregiver).  Consent for procedure obtained.  Time Out: Verified patient identification, verified procedure, site/side was marked, verified correct patient position, special equipment/implants available, medications/allergies/relevent history reviewed, required imaging and test results available. PERFORMED.  Patient placed on cardiac monitor, pulse oximetry, supplemental oxygen as necessary.  Sedation given:  See anesthesia records. Pacer pads placed anterior and posterior chest.  Cardioverted 2 time(s).  Cardioversion with synchronized biphasic 200J shock.  Evaluation: Findings: Post procedure EKG shows: NSR Complications: None Patient did tolerate procedure well.  Daughter [name] was informed of the findings.   Haley Roza 06/10/2021, 12:59 PM

## 2021-06-11 ENCOUNTER — Ambulatory Visit: Payer: Medicare PPO | Admitting: Nutrition

## 2021-06-11 ENCOUNTER — Encounter (HOSPITAL_COMMUNITY): Payer: Self-pay | Admitting: Cardiology

## 2021-06-14 DIAGNOSIS — E1165 Type 2 diabetes mellitus with hyperglycemia: Secondary | ICD-10-CM | POA: Diagnosis not present

## 2021-06-14 DIAGNOSIS — I10 Essential (primary) hypertension: Secondary | ICD-10-CM | POA: Diagnosis not present

## 2021-06-15 ENCOUNTER — Other Ambulatory Visit: Payer: Self-pay

## 2021-06-15 ENCOUNTER — Telehealth: Payer: Self-pay | Admitting: Cardiology

## 2021-06-15 DIAGNOSIS — I4819 Other persistent atrial fibrillation: Secondary | ICD-10-CM

## 2021-06-15 MED ORDER — AMIODARONE HCL 100 MG PO TABS: 100.0000 mg | ORAL_TABLET | Freq: Every day | ORAL | 1 refills | Status: DC

## 2021-06-15 NOTE — Telephone Encounter (Signed)
yes

## 2021-06-15 NOTE — Telephone Encounter (Signed)
Can I refill amiodarone?

## 2021-06-15 NOTE — Telephone Encounter (Signed)
Patient requesting refill for Amiodarone 100 MG, uses [facility] in [place], [place].

## 2021-06-16 ENCOUNTER — Telehealth: Payer: Self-pay

## 2021-06-16 ENCOUNTER — Other Ambulatory Visit: Payer: Self-pay

## 2021-06-16 MED ORDER — AMIODARONE HCL 200 MG PO TABS: 100.0000 mg | ORAL_TABLET | Freq: Every day | ORAL | 0 refills | Status: DC

## 2021-06-16 NOTE — Telephone Encounter (Signed)
Amiodarone 200 mg half a tab daily has been sent to pharmacy. MAR has been updated.

## 2021-06-16 NOTE — Telephone Encounter (Signed)
Please send in amiodarone 200 mg half a tablet daily 20-month supply.

## 2021-06-16 NOTE — Telephone Encounter (Signed)
Pts daughter called requesting that we send in amiodarone 200 mg because the pt can not afford the 100 mg tablets. Please advise.  [phone]

## 2021-06-19 DIAGNOSIS — E1165 Type 2 diabetes mellitus with hyperglycemia: Secondary | ICD-10-CM | POA: Diagnosis not present

## 2021-06-19 DIAGNOSIS — Z299 Encounter for prophylactic measures, unspecified: Secondary | ICD-10-CM | POA: Diagnosis not present

## 2021-06-19 DIAGNOSIS — I10 Essential (primary) hypertension: Secondary | ICD-10-CM | POA: Diagnosis not present

## 2021-06-19 DIAGNOSIS — I4892 Unspecified atrial flutter: Secondary | ICD-10-CM | POA: Diagnosis not present

## 2021-06-19 DIAGNOSIS — K5900 Constipation, unspecified: Secondary | ICD-10-CM | POA: Diagnosis not present

## 2021-06-23 ENCOUNTER — Ambulatory Visit: Payer: Medicare PPO | Admitting: Cardiology

## 2021-06-23 ENCOUNTER — Other Ambulatory Visit: Payer: Self-pay

## 2021-06-23 ENCOUNTER — Encounter: Payer: Self-pay | Admitting: Cardiology

## 2021-06-23 VITALS — BP 110/65 | HR 64 | Temp 97.5°F | Resp 16 | Ht 78.0 in | Wt 185.0 lb

## 2021-06-23 DIAGNOSIS — Z9889 Other specified postprocedural states: Secondary | ICD-10-CM

## 2021-06-23 DIAGNOSIS — Z951 Presence of aortocoronary bypass graft: Secondary | ICD-10-CM | POA: Diagnosis not present

## 2021-06-23 DIAGNOSIS — E782 Mixed hyperlipidemia: Secondary | ICD-10-CM

## 2021-06-23 DIAGNOSIS — I2510 Atherosclerotic heart disease of native coronary artery without angina pectoris: Secondary | ICD-10-CM | POA: Diagnosis not present

## 2021-06-23 DIAGNOSIS — I255 Ischemic cardiomyopathy: Secondary | ICD-10-CM

## 2021-06-23 DIAGNOSIS — I5022 Chronic systolic (congestive) heart failure: Secondary | ICD-10-CM

## 2021-06-23 DIAGNOSIS — I10 Essential (primary) hypertension: Secondary | ICD-10-CM

## 2021-06-23 DIAGNOSIS — I447 Left bundle-branch block, unspecified: Secondary | ICD-10-CM

## 2021-06-23 DIAGNOSIS — E1129 Type 2 diabetes mellitus with other diabetic kidney complication: Secondary | ICD-10-CM

## 2021-06-23 DIAGNOSIS — I480 Paroxysmal atrial fibrillation: Secondary | ICD-10-CM | POA: Diagnosis not present

## 2021-06-23 DIAGNOSIS — Z7901 Long term (current) use of anticoagulants: Secondary | ICD-10-CM | POA: Diagnosis not present

## 2021-06-23 DIAGNOSIS — Z8679 Personal history of other diseases of the circulatory system: Secondary | ICD-10-CM

## 2021-06-23 DIAGNOSIS — Z79899 Other long term (current) drug therapy: Secondary | ICD-10-CM

## 2021-06-23 MED ORDER — METOPROLOL SUCCINATE ER 25 MG PO TB24: 25.0000 mg | ORAL_TABLET | Freq: Every morning | ORAL | 0 refills | Status: DC

## 2021-06-23 MED ORDER — DAPAGLIFLOZIN PROPANEDIOL 10 MG PO TABS: 10.0000 mg | ORAL_TABLET | Freq: Every day | ORAL | 0 refills | Status: DC

## 2021-06-23 MED ORDER — ENTRESTO 97-103 MG PO TABS: 1.0000 | ORAL_TABLET | Freq: Two times a day (BID) | ORAL | 1 refills | Status: DC

## 2021-06-23 NOTE — Progress Notes (Signed)
ID:  Derek Bond, DOB [birth year], MRN [id number]  PCP:  Glenda Chroman, MD  Cardiologist:  Rex Kras, DO, [place] (established care 01/26/2021)  Date: 06/23/21 Last Office Visit: 05/19/2021  Chief Complaint  Patient presents with   Atrial Fibrillation   post TEE cardioversion   Follow-up    HPI  Derek Bond is a 76 y.o. male who presents to the office with a chief complaint of "  heart failure management and  2 week follow after cardioversion." Patient's past medical history and cardiovascular risk factors include: Ischemic cardiomyopathy, acute/chronic heart failure with reduced EF, CAD s/p CABG / Clipping of atrial appendage using 57mm atrial clip, hypertension, diabetes mellitus with microalbuminuria, erectile dysfunction, heartburn, former smoker, hyperlipidemia, sleep apnea not on CPAP, paroxysmal atrial fibrillation status post Maze procedure and DDCV (06/10/2021), advanced age.  He is referred to the office at the request of [name], MD for evaluation of shortness of breath.  Patient established care in September 2022 for progressive shortness of breath.  Initial EKG noted atrial fibrillation he was started on medical therapy.  However, due to several ER visits for chest pain he underwent elective left heart catheterization and was noted to have multivessel CAD and subsequently underwent four-vessel CABG, left atrial Maze procedure, and 45 mm atrial clip placement and he was discharged home on 03/04/2021.  Post bypass surgery his overall recovery has been slow and complicated by lower extremity cellulitis requiring parenteral antibiotics and hospitalization.  He continues to feel tired, fatigued and despite up titration of medications he remained in A-fib.  At the last office visit the shared decision was to proceed with TEE guided cardioversion.  He successfully underwent cardioversion on June 12, 2021 and now is here for 2-week follow-up.  Post cardioversion patient  states that his shortness of breath and fatigue has improved significantly, he is also ambulating more freely and is not requiring a cane except for prolonged distances.  He starts cardiac rehab in March 2023.  When he changes positions quickly especially while getting out of bed he feels lightheaded and dizzy but has not experienced syncope.  He still continues to lose weight.  He is requesting discontinuation of Lasix.    FUNCTIONAL STATUS: No structured exercise program or daily routine.   ALLERGIES: Allergies  Allergen Reactions   Bee Venom Swelling    MEDICATION LIST PRIOR TO VISIT: Current Meds  Medication Sig   acetaminophen (TYLENOL) 500 MG tablet Take 1,000 mg by mouth every 6 (six) hours as needed for moderate pain or mild pain.   ALPRAZolam (XANAX) 0.5 MG tablet Take 0.5 mg by mouth at bedtime as needed for sleep.   amiodarone (PACERONE) 200 MG tablet Take 0.5 tablets (100 mg total) by mouth daily.   apixaban (ELIQUIS) 5 MG TABS tablet Take 1 tablet (5 mg total) by mouth 2 (two) times daily.   aspirin EC 81 MG tablet Take 81 mg by mouth daily.   benzonatate (TESSALON) 200 MG capsule Take 200 mg by mouth 3 (three) times daily as needed for cough.   famotidine (PEPCID) 40 MG tablet Take 40 mg by mouth at bedtime.   feeding supplement (ENSURE ENLIVE / ENSURE PLUS) LIQD Take 237 mLs by mouth 2 (two) times daily between meals. Until oral intake improves   furosemide (LASIX) 40 MG tablet Take 1 tablet (40 mg total) by mouth daily. (Patient taking differently: Take 40 mg by mouth daily as needed for edema.)   glipiZIDE (  GLUCOTROL XL) 10 MG 24 hr tablet Take 10 mg by mouth 2 (two) times daily.   Magnesium Oxide 400 MG CAPS Take 1 capsule (400 mg total) by mouth daily.   metFORMIN (GLUCOPHAGE) 1000 MG tablet Take 1,000 mg by mouth daily with breakfast.   pantoprazole (PROTONIX) 40 MG tablet Take 40 mg by mouth every evening.   potassium chloride (KLOR-CON M) 10 MEQ tablet Take 1 tablet  (10 mEq total) by mouth daily.   rosuvastatin (CRESTOR) 5 MG tablet Take 5 mg by mouth at bedtime.   [DISCONTINUED] dapagliflozin propanediol (FARXIGA) 10 MG TABS tablet Take 1 tablet (10 mg total) by mouth daily.   [DISCONTINUED] metoprolol succinate (TOPROL-XL) 25 MG 24 hr tablet Take 1 tablet (25 mg total) by mouth daily.   [DISCONTINUED] sacubitril-valsartan (ENTRESTO) 97-103 MG Take 1 tablet by mouth 2 (two) times daily.     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 41    Hiatal hernia    History of melanoma    Obstructive sleep apnea    Type 2 diabetes mellitus ([place])     PAST SURGICAL HISTORY: Past Surgical History:  Procedure Laterality Date   BIOPSY  12/21/2019   Procedure: BIOPSY;  Surgeon: Harvel Quale, MD;  [facility];  Service: Gastroenterology;;  random colon   CARDIOVERSION N/A 06/10/2021   Procedure: CARDIOVERSION;  Surgeon: Rex Kras, DO;  [facility];  Service: Cardiovascular;  Laterality: N/A;   CLIPPING OF ATRIAL APPENDAGE N/A 02/26/2021   Procedure: CLIPPING OF ATRIAL APPENDAGE USING ATRICURE CLIP SIZE 70;  Surgeon: Lajuana Matte, MD;  Location: [place];  Service: Open Heart Surgery;  Laterality: N/A;   COLONOSCOPY WITH PROPOFOL N/A 12/21/2019   Procedure: COLONOSCOPY WITH PROPOFOL;  Surgeon: Harvel Quale, MD;  [facility];  Service: Gastroenterology;  Laterality: N/A;  915   CORONARY ARTERY BYPASS GRAFT N/A 02/26/2021   Procedure: CORONARY ARTERY BYPASS GRAFTING (CABG) X4, ON PUMP, USING LEFT INTERNAL MAMMARY ARTERY AND RIGHT ENDOSCOPICALLY HARVESTED GREATER SAPHENOUS VEIN;  Surgeon: Lajuana Matte, MD;  Location: [place];  Service: Open  Heart Surgery;  Laterality: N/A;   ENDOVEIN HARVEST OF GREATER SAPHENOUS VEIN Right 02/26/2021   Procedure: ENDOVEIN HARVEST OF GREATER SAPHENOUS VEIN;  Surgeon: Lajuana Matte, MD;  Location: [place];  Service: Open Heart Surgery;  Laterality: Right;   MAZE N/A 02/26/2021   Procedure: MAZE;  Surgeon: Lajuana Matte, MD;  Location: [place];  Service: Open Heart Surgery;  Laterality: N/A;   MELANOMA EXCISION     Left facial region   REPLACEMENT TOTAL KNEE Left    RIGHT/LEFT HEART CATH AND CORONARY ANGIOGRAPHY N/A 02/23/2021   Procedure: RIGHT/LEFT HEART CATH AND CORONARY ANGIOGRAPHY;  Surgeon: Nigel Mormon, MD;  Location: [place] [facility];  Service: Cardiovascular;  Laterality: N/A;   TEE WITHOUT CARDIOVERSION N/A 02/26/2021   Procedure: TRANSESOPHAGEAL ECHOCARDIOGRAM (TEE);  Surgeon: Lajuana Matte, MD;  Location: [place];  Service: Open Heart Surgery;  Laterality: N/A;   TEE WITHOUT CARDIOVERSION N/A 06/10/2021   Procedure: TRANSESOPHAGEAL ECHOCARDIOGRAM (TEE);  Surgeon: Rex Kras, DO;  [facility];  Service: Cardiovascular;  Laterality: N/A;  UMBILICAL HERNIA REPAIR      FAMILY HISTORY: The patient family history includes Diabetes in his brother, mother, and another family member; Hypertension in his brother, brother, sister, sister, and another family member; Lung cancer in his father; Rheum arthritis in an other family member; Stroke in an other family member.  SOCIAL HISTORY:  The patient  reports that he quit smoking about 32 years ago. His smoking use included cigarettes. He has a 60.00 pack-year smoking history. He has never used smokeless tobacco. He reports that he does not currently use alcohol. He reports that he does not use drugs.  REVIEW OF SYSTEMS: Review of Systems  Constitutional: Positive for malaise/fatigue (better) and weight loss. Negative for chills and fever.  HENT:  Negative for hoarse voice and nosebleeds.   Eyes:  Negative for  discharge, double vision and pain.  Cardiovascular:  Negative for chest pain, claudication, dyspnea on exertion, leg swelling, near-syncope, orthopnea, palpitations, paroxysmal nocturnal dyspnea and syncope.  Respiratory:  Negative for hemoptysis and shortness of breath.   Endocrine: Negative for heat intolerance.  Musculoskeletal:  Negative for muscle cramps and myalgias.  Gastrointestinal:  Negative for abdominal pain, constipation, diarrhea, heartburn, hematemesis, hematochezia, melena, nausea and vomiting.  Neurological:  Negative for dizziness and light-headedness.   PHYSICAL EXAM: Vitals with BMI 06/23/2021 06/10/2021 06/10/2021  Height $Remov'6\' 6"'ZQESRe$  - -  Weight 185 lbs - -  BMI 78.93 - -  Systolic 810 175 99  Diastolic 65 68 67  Pulse 64 63 63    CONSTITUTIONAL: Well-developed and well-nourished. No acute distress.  Ambulates with a cane. SKIN: Skin is warm and dry. No rash noted. No cyanosis. No pallor. No jaundice HEAD: Normocephalic and atraumatic.  EYES: No scleral icterus MOUTH/THROAT: Moist oral membranes.  NECK: No JVD present. No thyromegaly noted. No carotid bruits  LYMPHATIC: No visible cervical adenopathy.  CHEST Normal respiratory effort. No intercostal retractions.  Sternotomy site is healing well. LUNGS: Clear to auscultation bilaterally.  No stridor. No wheezes. No rales.  CARDIOVASCULAR: Regular, S1-S2, no murmurs rubs or gallops appreciated secondary to tachycardia. ABDOMINAL: Soft, nontender, nondistended, positive bowel sounds in all 4 quadrants, no apparent ascites.  EXTREMITIES: No pitting edema. Right lower extremity warm to touch, erythematous.  No active drainage or wounds. HEMATOLOGIC: No significant bruising NEUROLOGIC: Oriented to person, place, and time. Nonfocal. Normal muscle tone.  PSYCHIATRIC: Normal mood and affect. Normal behavior. Cooperative  CARDIAC DATABASE: 02/26/2021: CABG x4 (LIMA to LAD, SVG to DIAGONAL, SVG to OM, SVG to PLB) + MAZE + 45 mm  atrial clip placement.  06/10/2021: TEE guided cardioversion for restoration of normal sinus rhythm with synchronized biphasic 200 J x 2  EKG: 01/26/2021: Atrial fibrillation with rapid ventricular rate 126 bpm, left axis deviation, left anterior fascicular block, left bundle branch block, consider old inferior infarct.  05/19/2021: Atrial fibrillation, 110 bpm, left axis, LBBB.  06/23/2021: NSR, 80bpm, LBBB, frequent PVC.  Echocardiogram: 02/05/2021:  Severely depressed LV systolic function with visual EF 25-30%. Hypokinetic global wall motion with regionality involving septal wall. Left ventricle cavity is mildly dilated. Moderate left ventricular hypertrophy. Unable to evaluate diastolic function due to atrial fibrillation. Elevated LAP.  Moderate (Grade II) mitral regurgitation.  Moderate tricuspid regurgitation. Mild pulmonary hypertension, RVSP 32mmHg.  IVC is dilated with a respiratory response of <50%.  No prior study for comparison.  02/25/2021:  1. Left ventricular ejection fraction, by estimation, is 30 to 35%. The left ventricle has moderately decreased function. The left ventricle demonstrates  global hypokinesis. There is moderate left ventricular hypertrophy. Left ventricular diastolic function could not be evaluated.   2. Right ventricular systolic function is normal. The right ventricular size is mildly enlarged. There is normal pulmonary artery systolic pressure.   3. Left atrial size was severely dilated.   4. Right atrial size was dilated.   5. The mitral valve is normal in structure. Mild mitral valve regurgitation. No evidence of mitral stenosis.   6. The aortic valve is tricuspid. Aortic valve regurgitation is not visualized.   7. There is mild dilatation of the ascending aorta, measuring 40 mm.   Stress Testing: MPI 02/12/2016: No diagnostic ST segment changes to indicate ischemia. No significant arrhythmias. Small, mild intensity, partially reversible mid to basal  inferior defect suggestive of variable soft tissue attenuation. No large ischemic zones are noted. This is a low risk study. Nuclear stress EF: 55%.  Heart Catheterization: Left and right heart catheterization 02/23/2021 LM: Normal LAD: Prox 80% stenosius at bifurcation with small to medium caliber D1 with 75% long prox disease. Mid 70% disease Ramus: Minimal luminal irregularities Lcx: OM1 with prox 80% stenosis RCA: 60-70% disease in PL branches   RA: 2 mmHg RV: 22/0 mmHg PA: 21/5 mmHg, mPAP 13 mmHg PCW: 8 mmHg   CO: 5.6 L/min CI: 2.5 L/min/m2     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%, diabetic patient CVTS consult for CABG  LABORATORY DATA: CBC Latest Ref Rng & Units 06/10/2021 05/12/2021 04/14/2021  WBC 4.0 - 10.5 K/uL - - 6.5  Hemoglobin 13.0 - 17.0 g/dL 16.3 13.5 14.1  Hematocrit 39.0 - 52.0 % 48.0 42.9 44.2  Platelets 150 - 400 K/uL - - 378    CMP Latest Ref Rng & Units 06/10/2021 05/12/2021 04/14/2021  Glucose 70 - 99 mg/dL 138(H) 189(H) 129(H)  BUN 8 - 23 mg/dL $Remove'23 24 12  'snAiESo$ Creatinine 0.61 - 1.24 mg/dL 0.70 0.88 0.85  Sodium 135 - 145 mmol/L 139 138 135  Potassium 3.5 - 5.1 mmol/L 4.5 4.4 4.3  Chloride 98 - 111 mmol/L 104 100 101  CO2 20 - 29 mmol/L - 23 23  Calcium 8.6 - 10.2 mg/dL - 10.0 9.6  Total Protein 6.5 - 8.1 g/dL - - -  Total Bilirubin 0.3 - 1.2 mg/dL - - -  Alkaline Phos 38 - 126 U/L - - -  AST 15 - 41 U/L - - -  ALT 0 - 44 U/L - - -    Lipid Panel     Component Value Date/Time   CHOL 99 02/22/2021 0131   TRIG 56 02/22/2021 0131   HDL 44 02/22/2021 0131   CHOLHDL 2.3 02/22/2021 0131   VLDL 11 02/22/2021 0131   LDLCALC 44 02/22/2021 0131    No components found for: NTPROBNP Recent Labs    02/05/21 1441 02/18/21 1534 03/23/21 1459 03/30/21 0957 05/12/21 1346  PROBNP 1,567* 2,355* 1,938* 1,722* 1,668*   Recent Labs    03/30/21 0958 04/15/21 0411 05/12/21 1346  TSH 6.220* 4.161 3.800    BMP Recent Labs     03/02/21 0126 03/23/21 1459 03/31/21 2048 04/14/21 1419 05/12/21 1346 06/10/21 1157  NA 131*   < > 134* 135 138 139  K 4.4   < > 4.6 4.3 4.4 4.5  CL 102   < > 100 101 100 104  CO2 23   < > $R'23 23 23  'Kz$ --   GLUCOSE 174*   < > 185* 129* 189* 138*  BUN 22   < >  $'21 12 24 23  'a$ CREATININE 0.76   < > 1.04 0.85 0.88 0.70  CALCIUM 8.8*   < > 9.4 9.6 10.0  --   GFRNONAA >60  --  >60 >60  --   --    < > = values in this interval not displayed.    HEMOGLOBIN A1C Lab Results  Component Value Date   HGBA1C 8.1 (H) 02/22/2021   MPG 185.77 02/22/2021    IMPRESSION:    ICD-10-CM   1. Chronic HFrEF (heart failure with reduced ejection fraction) (HCC)  I50.22 dapagliflozin propanediol (FARXIGA) 10 MG TABS tablet    sacubitril-valsartan (ENTRESTO) 97-103 MG    metoprolol succinate (TOPROL-XL) 25 MG 24 hr tablet    CMP14+EGFR    Pro b natriuretic peptide (BNP)    2. Paroxysmal atrial fibrillation (HCC)  I48.0 EKG 12-Lead    3. S/P Maze operation for atrial fibrillation  Z98.890    Z86.79     4. Long term (current) use of anticoagulants  Z79.01     5. Long term current use of antiarrhythmic drug  Z79.899 CMP14+EGFR    6. Atherosclerosis of native coronary artery of native heart without angina pectoris  I25.10     7. S/P CABG x 4  Z95.1     8. LBBB (left bundle branch block)  I44.7     9. Ischemic cardiomyopathy  I25.5     10. Type 2 diabetes mellitus with microalbuminuria, without long-term current use of insulin (HCC)  E11.29    R80.9     11. Mixed hyperlipidemia  E78.2     12. Benign hypertension  I10         RECOMMENDATIONS: Derek Bond is a 76 y.o. male whose past medical history and cardiac risk factors include: Ischemic cardiomyopathy, acute/chronic heart failure with reduced EF, hypertension, diabetes mellitus with microalbuminuria, erectile dysfunction, heartburn, former smoker, hyperlipidemia, sleep apnea not on CPAP, persistent atrial fibrillation status post  Maze procedure, advanced age.  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 TEE 06/10/2021 notes improvement in LVEF to 40-45% by visual estimation when compared to 02/2021 LVEF 30-35%. Currently on guideline directed medical therapy at maximally tolerated doses. Due to soft blood pressures, symptoms suggestive of orthostasis, and feeling tired and fatigue the shared decision was to transition him to Lasix on as needed basis.  Patient is asked to take Lasix if he gains more than 1 pound over 24 hours or 3 pounds over the course of the week. Refilled Lisabeth Register, and Toprol-XL. If he remains in normal sinus rhythm long-term would like to repeat an echocardiogram in 90 days to reevaluate LVEF and sinus rhythm Patient's overall functional status has improved after restoration of normal sinus rhythm.  He starts cardiac rehab in March 2023. We will repeat labs prior to the next office visit.  Paroxysmal atrial fibrillation (HCC)/status post Maze procedure/left atrial appendage clipping/TEE guided cardioversion Rate control: Metoprolol. Rhythm control: Amiodarone 100 mg p.o. daily. Thromboembolic prophylaxis: Eliquis CHA2DS2-VASc SCORE is 5 which correlates to 6.7% risk of stroke per year. Successfully underwent cardioversion June 10, 2021 to normal sinus rhythm. EKG today shows sinus rhythm, occasional PVCs.  Long term (current) use of anticoagulants Indication: Atrial fibrillation. Reemphasized the risks, benefits, and alternatives to oral anticoagulation. Does not endorse evidence of bleeding. Hemoglobin 16.3 g/dL as of June 10, 2021.  Long term current use of antiarrhythmic drug Indication: Atrial fibrillation TSH 3.8 (04/2021) Chest  x-ray 05/2021 AST 70, ALT 47 as of 02/2021 Patient has been having annual eye exam and PFT. Currently on amiodarone 100 mg p.o. daily.  Atherosclerosis of native coronary  artery of native heart without angina pectoris/four-vessel CABG/ischemic cardiomyopathy Chronic and stable. Improving. No use of sublingual nitroglycerin tablets. Educated on the importance of improving his modifiable cardiovascular risk factors. Start cardiac rehab March 2023 See above  Type 2 diabetes mellitus with microalbuminuria, without long-term current use of insulin ([place]) Continue Bertell Maria, Farxiga, statin therapy. Educated importance of glycemic control.  Mixed hyperlipidemia Currently on rosuvastatin.   He denies myalgia or other side effects. Most recent lipids dated 02/2021 reviewed as noted above.  LDL currently at goal.  Total time spent: 49 minutes today's office visit we discussed management of 2 or more chronic stable conditions, reviewed results of the transesophageal echocardiogram and EKG independently with the patient and his daughter, ordering additional diagnostic testing prior to the next office visit, refilled heart failure medications, patient's daughter [name] also acted as an independent historian, and discussed continuing monitoring of antiarrhythmic medications (i.e. amiodarone).  FINAL MEDICATION LIST END OF ENCOUNTER: Meds ordered this encounter  Medications   dapagliflozin propanediol (FARXIGA) 10 MG TABS tablet    Sig: Take 1 tablet (10 mg total) by mouth daily.    Dispense:  90 tablet    Refill:  0   sacubitril-valsartan (ENTRESTO) 97-103 MG    Sig: Take 1 tablet by mouth 2 (two) times daily.    Dispense:  180 tablet    Refill:  1   metoprolol succinate (TOPROL-XL) 25 MG 24 hr tablet    Sig: Take 1 tablet (25 mg total) by mouth every morning.    Dispense:  90 tablet    Refill:  0   Medications Discontinued During This Encounter  Medication Reason   amoxicillin (AMOXIL) 500 MG capsule    metoprolol succinate (TOPROL-XL) 25 MG 24 hr tablet Reorder   sacubitril-valsartan (ENTRESTO) 97-103 MG Reorder   dapagliflozin propanediol (FARXIGA) 10 MG TABS  tablet Reorder      Current Outpatient Medications:    acetaminophen (TYLENOL) 500 MG tablet, Take 1,000 mg by mouth every 6 (six) hours as needed for moderate pain or mild pain., Disp: , Rfl:    ALPRAZolam (XANAX) 0.5 MG tablet, Take 0.5 mg by mouth at bedtime as needed for sleep., Disp: , Rfl:    amiodarone (PACERONE) 200 MG tablet, Take 0.5 tablets (100 mg total) by mouth daily., Disp: 30 tablet, Rfl: 0   apixaban (ELIQUIS) 5 MG TABS tablet, Take 1 tablet (5 mg total) by mouth 2 (two) times daily., Disp: 180 tablet, Rfl: 0   aspirin EC 81 MG tablet, Take 81 mg by mouth daily., Disp: , Rfl:    benzonatate (TESSALON) 200 MG capsule, Take 200 mg by mouth 3 (three) times daily as needed for cough., Disp: , Rfl:    famotidine (PEPCID) 40 MG tablet, Take 40 mg by mouth at bedtime., Disp: , Rfl:    feeding supplement (ENSURE ENLIVE / ENSURE PLUS) LIQD, Take 237 mLs by mouth 2 (two) times daily between meals. Until oral intake improves, Disp: 237 mL, Rfl: 12   furosemide (LASIX) 40 MG tablet, Take 1 tablet (40 mg total) by mouth daily. (Patient taking differently: Take 40 mg by mouth daily as needed for edema.), Disp: 30 tablet, Rfl: 0   glipiZIDE (GLUCOTROL XL) 10 MG 24 hr tablet, Take 10 mg by mouth 2 (two) times daily., Disp: ,  Rfl:    Magnesium Oxide 400 MG CAPS, Take 1 capsule (400 mg total) by mouth daily., Disp: 90 capsule, Rfl: 0   metFORMIN (GLUCOPHAGE) 1000 MG tablet, Take 1,000 mg by mouth daily with breakfast., Disp: , Rfl:    pantoprazole (PROTONIX) 40 MG tablet, Take 40 mg by mouth every evening., Disp: , Rfl:    potassium chloride (KLOR-CON M) 10 MEQ tablet, Take 1 tablet (10 mEq total) by mouth daily., Disp: 30 tablet, Rfl: 0   rosuvastatin (CRESTOR) 5 MG tablet, Take 5 mg by mouth at bedtime., Disp: , Rfl:    dapagliflozin propanediol (FARXIGA) 10 MG TABS tablet, Take 1 tablet (10 mg total) by mouth daily., Disp: 90 tablet, Rfl: 0   metoprolol succinate (TOPROL-XL) 25 MG 24 hr  tablet, Take 1 tablet (25 mg total) by mouth every morning., Disp: 90 tablet, Rfl: 0   sacubitril-valsartan (ENTRESTO) 97-103 MG, Take 1 tablet by mouth 2 (two) times daily., Disp: 180 tablet, Rfl: 1  Orders Placed This Encounter  Procedures   CMP14+EGFR   Pro b natriuretic peptide (BNP)   EKG 12-Lead   There are no Patient Instructions on file for this visit.   --Continue cardiac medications as reconciled in final medication list. --Return in about 3 months (around 09/20/2021) for Follow up, heart failure management., A. fib, labs. Or sooner if needed. --Continue follow-up with your primary care physician regarding the management of your other chronic comorbid conditions.  Patient's questions and concerns were addressed to his satisfaction. He voices understanding of the instructions provided during this encounter.   This note was created using a voice recognition software as a result there may be grammatical errors inadvertently enclosed that do not reflect the nature of this encounter. Every attempt is made to correct such errors.  Rex Kras, [place], [facility]  Pager: [phone] Office: [phone]

## 2021-06-24 ENCOUNTER — Other Ambulatory Visit: Payer: Self-pay

## 2021-06-24 DIAGNOSIS — I5022 Chronic systolic (congestive) heart failure: Secondary | ICD-10-CM

## 2021-06-24 DIAGNOSIS — Z79899 Other long term (current) drug therapy: Secondary | ICD-10-CM

## 2021-06-25 ENCOUNTER — Other Ambulatory Visit: Payer: Self-pay | Admitting: Cardiology

## 2021-06-25 DIAGNOSIS — M109 Gout, unspecified: Secondary | ICD-10-CM | POA: Diagnosis not present

## 2021-06-25 DIAGNOSIS — Z87891 Personal history of nicotine dependence: Secondary | ICD-10-CM | POA: Diagnosis not present

## 2021-06-25 DIAGNOSIS — I429 Cardiomyopathy, unspecified: Secondary | ICD-10-CM | POA: Diagnosis not present

## 2021-06-25 DIAGNOSIS — Z299 Encounter for prophylactic measures, unspecified: Secondary | ICD-10-CM | POA: Diagnosis not present

## 2021-06-25 DIAGNOSIS — E1165 Type 2 diabetes mellitus with hyperglycemia: Secondary | ICD-10-CM | POA: Diagnosis not present

## 2021-06-25 DIAGNOSIS — I4892 Unspecified atrial flutter: Secondary | ICD-10-CM | POA: Diagnosis not present

## 2021-06-26 ENCOUNTER — Encounter (HOSPITAL_COMMUNITY)
Admission: RE | Admit: 2021-06-26 | Discharge: 2021-06-26 | Disposition: A | Discharge status: Home / Self Care | Payer: Medicare PPO | Source: Ambulatory Visit | Attending: Cardiology | Admitting: Cardiology

## 2021-06-26 ENCOUNTER — Encounter (HOSPITAL_COMMUNITY): Payer: Self-pay

## 2021-06-26 VITALS — BP 100/50 | HR 72 | Ht 78.0 in | Wt 183.0 lb

## 2021-06-26 DIAGNOSIS — Z79899 Other long term (current) drug therapy: Secondary | ICD-10-CM | POA: Insufficient documentation

## 2021-06-26 DIAGNOSIS — Z951 Presence of aortocoronary bypass graft: Secondary | ICD-10-CM | POA: Insufficient documentation

## 2021-06-26 LAB — GLUCOSE, CAPILLARY: Glucose-Capillary: 241 mg/dL — ABNORMAL HIGH (ref 70–99)

## 2021-06-26 NOTE — Progress Notes (Signed)
Cardiac Individual Treatment Plan  Patient Details  Name: Derek Bond [facility] MRN: [id number] Date of Birth: 03/22/1946 Referring Provider:   Flowsheet Row CARDIAC REHAB PHASE II ORIENTATION from 06/26/2021 in [place]  Referring Provider Dr. Kipp Brood       Initial Encounter Date:  Flowsheet Row CARDIAC REHAB PHASE II ORIENTATION from 06/26/2021 in [place]  Date 06/26/21       Visit Diagnosis: S/P CABG x 4  Patient's Home Medications on Admission:  Current Outpatient Medications:    acetaminophen (TYLENOL) 500 MG tablet, Take 1,000 mg by mouth every 6 (six) hours as needed for moderate pain or mild pain., Disp: , Rfl:    ALPRAZolam (XANAX) 0.5 MG tablet, Take 0.5 mg by mouth at bedtime as needed for sleep., Disp: , Rfl:    amiodarone (PACERONE) 200 MG tablet, Take 0.5 tablets (100 mg total) by mouth daily., Disp: 30 tablet, Rfl: 0   apixaban (ELIQUIS) 5 MG TABS tablet, Take 1 tablet (5 mg total) by mouth 2 (two) times daily., Disp: 180 tablet, Rfl: 0   aspirin EC 81 MG tablet, Take 81 mg by mouth daily., Disp: , Rfl:    benzonatate (TESSALON) 200 MG capsule, Take 200 mg by mouth 3 (three) times daily as needed for cough., Disp: , Rfl:    dapagliflozin propanediol (FARXIGA) 10 MG TABS tablet, Take 1 tablet (10 mg total) by mouth daily., Disp: 90 tablet, Rfl: 0   famotidine (PEPCID) 40 MG tablet, Take 40 mg by mouth at bedtime., Disp: , Rfl:    feeding supplement (ENSURE ENLIVE / ENSURE PLUS) LIQD, Take 237 mLs by mouth 2 (two) times daily between meals. Until oral intake improves, Disp: 237 mL, Rfl: 12   furosemide (LASIX) 40 MG tablet, TAKE 1 TABLET BY MOUTH EVERY DAY, Disp: 30 tablet, Rfl: 0   glipiZIDE (GLUCOTROL XL) 10 MG 24 hr tablet, Take 10 mg by mouth 2 (two) times daily., Disp: , Rfl:    KLOR-CON M10 10 MEQ tablet, TAKE 1 TABLET BY MOUTH EVERY DAY, Disp: 30 tablet, Rfl: 0   Magnesium Oxide 400 MG CAPS, Take 1 capsule (400 mg  total) by mouth daily., Disp: 90 capsule, Rfl: 0   metFORMIN (GLUCOPHAGE) 1000 MG tablet, Take 1,000 mg by mouth daily with breakfast., Disp: , Rfl:    metoprolol succinate (TOPROL-XL) 25 MG 24 hr tablet, Take 1 tablet (25 mg total) by mouth every morning., Disp: 90 tablet, Rfl: 0   pantoprazole (PROTONIX) 40 MG tablet, Take 40 mg by mouth every evening., Disp: , Rfl:    rosuvastatin (CRESTOR) 5 MG tablet, Take 5 mg by mouth at bedtime., Disp: , Rfl:    sacubitril-valsartan (ENTRESTO) 97-103 MG, Take 1 tablet by mouth 2 (two) times daily., Disp: 180 tablet, Rfl: 1  Past Medical History: Past Medical History:  Diagnosis Date   Atrial fibrillation (HCC)    CAD (coronary artery disease)    Mild nonobstructive disease at cardiac catheterization 2009   Cancer 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    Due to laceration at age 76    Hiatal hernia    History of melanoma    Obstructive sleep apnea    Type 2 diabetes mellitus (HCC)     Tobacco Use: Social History   Tobacco Use  Smoking Status Former   Packs/day: 2.00   Years: 30.00   Pack years: 60.00   Types:  Cigarettes   Quit date: 05/17/1989   Years since quitting: 32.1  Smokeless Tobacco Never    Labs: Recent Review Flowsheet Data     Labs for ITP Cardiac and Pulmonary Rehab Latest Ref Rng & Units 02/26/2021 02/26/2021 02/26/2021 02/26/2021 06/10/2021   Cholestrol 0 - 200 mg/dL - - - - -   LDLCALC 0 - 99 mg/dL - - - - -   HDL >40 mg/dL - - - - -   Trlycerides <150 mg/dL - - - - -   Hemoglobin A1c 4.8 - 5.6 % - - - - -   PHART 7.350 - 7.450 7.436 - 7.425 7.339(L) -   PCO2ART 32.0 - 48.0 mmHg 39.7 - 37.5 33.9 -   HCO3 20.0 - 28.0 mmol/L 26.7 - 24.6 18.2(L) -   TCO2 22 - 32 mmol/L 28 27 26  19(L) 25   ACIDBASEDEF 0.0 - 2.0 mmol/L - - - 7.0(H) -   O2SAT % 100.0 - 99.0 99.0 -       Capillary Blood Glucose: Lab Results  Component Value Date   GLUCAP 241 (H) 06/26/2021   GLUCAP 125 (H)  06/10/2021   GLUCAP 148 (H) 04/14/2021   GLUCAP 131 (H) 04/14/2021   GLUCAP 167 (H) 04/02/2021     Exercise Target Goals: Exercise Program Goal: Individual exercise prescription set using results from initial 6 min walk test and THRR while considering  patients activity barriers and safety.   Exercise Prescription Goal: Starting with aerobic activity 30 plus minutes a day, 3 days per week for initial exercise prescription. Provide home exercise prescription and guidelines that participant acknowledges understanding prior to discharge.  Activity Barriers & Risk Stratification:  Activity Barriers & Cardiac Risk Stratification - 06/26/21 1250       Activity Barriers & Cardiac Risk Stratification   Activity Barriers Left Knee Replacement;Deconditioning;Shortness of Breath;Balance Concerns;Assistive Device;Chest Pain/Angina    Cardiac Risk Stratification High             6 Minute Walk:  6 Minute Walk     Row Name 06/26/21 1407         6 Minute Walk   Phase Initial     Distance 800 feet     Walk Time 6 minutes     MPH 1.5     METS 2.16     RPE 11     VO2 Peak 7.56     Symptoms No     Resting HR 72 bpm     Resting BP 100/50     Resting Oxygen Saturation  94 %     Exercise Oxygen Saturation  during 6 min walk 95 %     Max Ex. HR 85 bpm     Max Ex. BP 115/58     2 Minute Post BP 102/58              Oxygen Initial Assessment:   Oxygen Re-Evaluation:   Oxygen Discharge (Final Oxygen Re-Evaluation):   Initial Exercise Prescription:  Initial Exercise Prescription - 06/26/21 1400       Date of Initial Exercise RX and Referring Provider   Date 06/26/21    Referring Provider Dr. Kipp Brood    Expected Discharge Date 09/11/21      NuStep   Level 1    SPM 60    Minutes 22      Arm Ergometer   Level 1    RPM 50    Minutes 17  Prescription Details   Frequency (times per week) 3    Duration Progress to 30 minutes of continuous aerobic without  signs/symptoms of physical distress      Intensity   THRR 40-80% of Max Heartrate 58-116    Ratings of Perceived Exertion 11-13    Perceived Dyspnea 0-4      Resistance Training   Training Prescription Yes    Weight 2    Reps 10-15             Perform Capillary Blood Glucose checks as needed.  Exercise Prescription Changes:   Exercise Comments:   Exercise Goals and Review:   Exercise Goals     Row Name 06/26/21 1412             Exercise Goals   Increase Physical Activity Yes       Intervention Provide advice, education, support and counseling about physical activity/exercise needs.;Develop an individualized exercise prescription for aerobic and resistive training based on initial evaluation findings, risk stratification, comorbidities and participant's personal goals.       Expected Outcomes Short Term: Attend rehab on a regular basis to increase amount of physical activity.;Long Term: Add in home exercise to make exercise part of routine and to increase amount of physical activity.;Long Term: Exercising regularly at least 3-5 days a week.       Increase Strength and Stamina Yes       Intervention Provide advice, education, support and counseling about physical activity/exercise needs.;Develop an individualized exercise prescription for aerobic and resistive training based on initial evaluation findings, risk stratification, comorbidities and participant's personal goals.       Expected Outcomes Short Term: Increase workloads from initial exercise prescription for resistance, speed, and METs.;Short Term: Perform resistance training exercises routinely during rehab and add in resistance training at home;Long Term: Improve cardiorespiratory fitness, muscular endurance and strength as measured by increased METs and functional capacity (6MWT)       Able to understand and use rate of perceived exertion (RPE) scale Yes       Intervention Provide education and explanation on how  to use RPE scale       Expected Outcomes Short Term: Able to use RPE daily in rehab to express subjective intensity level;Long Term:  Able to use RPE to guide intensity level when exercising independently       Knowledge and understanding of Target Heart Rate Range (THRR) Yes       Intervention Provide education and explanation of THRR including how the numbers were predicted and where they are located for reference       Expected Outcomes Short Term: Able to state/look up THRR;Long Term: Able to use THRR to govern intensity when exercising independently;Short Term: Able to use daily as guideline for intensity in rehab       Able to check pulse independently Yes       Intervention Provide education and demonstration on how to check pulse in carotid and radial arteries.;Review the importance of being able to check your own pulse for safety during independent exercise       Expected Outcomes Short Term: Able to explain why pulse checking is important during independent exercise       Understanding of Exercise Prescription Yes       Intervention Provide education, explanation, and written materials on patient's individual exercise prescription       Expected Outcomes Short Term: Able to explain program exercise prescription;Long Term: Able to explain home exercise  prescription to exercise independently                Exercise Goals Re-Evaluation :    Discharge Exercise Prescription (Final Exercise Prescription Changes):   Nutrition:  Target Goals: Understanding of nutrition guidelines, daily intake of sodium 1500mg , cholesterol 200mg , calories 30% from fat and 7% or less from saturated fats, daily to have 5 or more servings of fruits and vegetables.  Biometrics:  Pre Biometrics - 06/26/21 1412       Pre Biometrics   Height 6\' 6"  (1.981 m)    Weight 83 kg    Waist Circumference 43 inches    Hip Circumference 35 inches    Waist to Hip Ratio 1.23 %    BMI (Calculated) 21.15     Triceps Skinfold 6 mm    % Body Fat 22.6 %    Grip Strength 20.3 kg    Flexibility 8 in    Single Leg Stand 60 seconds              Nutrition Therapy Plan and Nutrition Goals:  Nutrition Therapy & Goals - 06/26/21 1352       Personal Nutrition Goals   Comments Patient scored 73 on his diet assessment. Score explained to patient and his grandson. Handout provided and explained on healthier choices and DM control. Patient verbalized understanding. His daughter own a convinence store and he eats there every meal except breakfast. He says he is willing to try some healthy changes but is probably not going to change his diet a lot. Assistance with RD referral offerred. Patient declined.      Intervention Plan   Intervention Nutrition handout(s) given to patient.    Expected Outcomes Short Term Goal: Understand basic principles of dietary content, such as calories, fat, sodium, cholesterol and nutrients.             Nutrition Assessments:  Nutrition Assessments - 06/26/21 1352       MEDFICTS Scores   Pre Score 73            MEDIFICTS Score Key: ?70 Need to make dietary changes  40-70 Heart Healthy Diet ? 40 Therapeutic Level Cholesterol Diet   Picture Your Plate Scores: <94 Unhealthy dietary pattern with much room for improvement. 41-50 Dietary pattern unlikely to meet recommendations for good health and room for improvement. 51-60 More healthful dietary pattern, with some room for improvement.  >60 Healthy dietary pattern, although there may be some specific behaviors that could be improved.    Nutrition Goals Re-Evaluation:   Nutrition Goals Discharge (Final Nutrition Goals Re-Evaluation):   Psychosocial: Target Goals: Acknowledge presence or absence of significant depression and/or stress, maximize coping skills, provide positive support system. Participant is able to verbalize types and ability to use techniques and skills needed for reducing stress and  depression.  Initial Review & Psychosocial Screening:  Initial Psych Review & Screening - 06/26/21 1426       Initial Review   Current issues with None Identified      Family Dynamics   Good Support System? Yes      Barriers   Psychosocial barriers to participate in program There are no identifiable barriers or psychosocial needs.      Screening Interventions   Interventions Encouraged to exercise;Provide feedback about the scores to participant    Expected Outcomes Short Term goal: Identification and review with participant of any Quality of Life or Depression concerns found by scoring the questionnaire.  Quality of Life Scores:  Quality of Life - 06/26/21 1413       Quality of Life   Select Quality of Life      Quality of Life Scores   Health/Function Pre 20.57 %    Socioeconomic Pre 21.79 %    Psych/Spiritual Pre 23.14 %    Family Pre 24 %    GLOBAL Pre 21.83 %            Scores of 19 and below usually indicate a poorer quality of life in these areas.  A difference of  2-3 points is a clinically meaningful difference.  A difference of 2-3 points in the total score of the Quality of Life Index has been associated with significant improvement in overall quality of life, self-image, physical symptoms, and general health in studies assessing change in quality of life.  PHQ-9: Recent Review Flowsheet Data     Depression screen [facility] 2/9 06/26/2021   Decreased Interest 1   Down, Depressed, Hopeless 1   PHQ - 2 Score 2   Altered sleeping 0   Tired, decreased energy 3   Change in appetite 0   Feeling bad or failure about yourself  0   Trouble concentrating 3   Moving slowly or fidgety/restless 2   Suicidal thoughts 0   PHQ-9 Score 10   Difficult doing work/chores Somewhat difficult      Interpretation of Total Score  Total Score Depression Severity:  1-4 = Minimal depression, 5-9 = Mild depression, 10-14 = Moderate depression, 15-19 = Moderately  severe depression, 20-27 = Severe depression   Psychosocial Evaluation and Intervention:  Psychosocial Evaluation - 06/26/21 1426       Psychosocial Evaluation & Interventions   Interventions Stress management education;Relaxation education;Encouraged to exercise with the program and follow exercise prescription    Comments Patient has no psychosocial barriers or issues identified at his orientation visit. He denies any depression, anxiety, or stress. His initial PHQ-9 score was 8 which he says is related to him not having the energy to do the things he used to do causing frustration. His overall QOL was 21.83% scoring lowest in health/function. His wife of 88 years passed away 3 years ago. His grandaughter moved in with  him after she died so he would not be alone and she has recently married and continues to live with him along with her brother. He has 2 daughters and 6 grandchildren. He names all of them as his support as well as a close friend. He says he is "well looked after". He is ready to start the program hoping to be able to do his yardwork and gardening this Spring.    Expected Outcomes Patient will continue to have no psychosocial barriers or issues identified.    Continue Psychosocial Services  No Follow up required             Psychosocial Re-Evaluation:   Psychosocial Discharge (Final Psychosocial Re-Evaluation):   Vocational Rehabilitation: Provide vocational rehab assistance to qualifying candidates.   Vocational Rehab Evaluation & Intervention:  Vocational Rehab - 06/26/21 1355       Initial Vocational Rehab Evaluation & Intervention   Assessment shows need for Vocational Rehabilitation No      Vocational Rehab Re-Evaulation   Comments Patient is retired and does not need vocational rehab.             Education: Education Goals: Education classes will be provided on a weekly basis, covering required topics. Participant  will state understanding/return  demonstration of topics presented.  Learning Barriers/Preferences:  Learning Barriers/Preferences - 06/26/21 1356       Learning Barriers/Preferences   Learning Barriers None    Learning Preferences Audio             Education Topics: Hypertension, Hypertension Reduction -Define heart disease and high blood pressure. Discus how high blood pressure affects the body and ways to reduce high blood pressure.   Exercise and Your Heart -Discuss why it is important to exercise, the FITT principles of exercise, normal and abnormal responses to exercise, and how to exercise safely.   Angina -Discuss definition of angina, causes of angina, treatment of angina, and how to decrease risk of having angina.   Cardiac Medications -Review what the following cardiac medications are used for, how they affect the body, and side effects that may occur when taking the medications.  Medications include Aspirin, Beta blockers, calcium channel blockers, ACE Inhibitors, angiotensin receptor blockers, diuretics, digoxin, and antihyperlipidemics.   Congestive Heart Failure -Discuss the definition of CHF, how to live with CHF, the signs and symptoms of CHF, and how keep track of weight and sodium intake.   Heart Disease and Intimacy -Discus the effect sexual activity has on the heart, how changes occur during intimacy as we age, and safety during sexual activity.   Smoking Cessation / COPD -Discuss different methods to quit smoking, the health benefits of quitting smoking, and the definition of COPD.   Nutrition I: Fats -Discuss the types of cholesterol, what cholesterol does to the heart, and how cholesterol levels can be controlled.   Nutrition II: Labels -Discuss the different components of food labels and how to read food label   Heart Parts/Heart Disease and PAD -Discuss the anatomy of the heart, the pathway of blood circulation through the heart, and these are affected by heart  disease.   Stress I: Signs and Symptoms -Discuss the causes of stress, how stress may lead to anxiety and depression, and ways to limit stress.   Stress II: Relaxation -Discuss different types of relaxation techniques to limit stress.   Warning Signs of Stroke / TIA -Discuss definition of a stroke, what the signs and symptoms are of a stroke, and how to identify when someone is having stroke.   Knowledge Questionnaire Score:  Knowledge Questionnaire Score - 06/26/21 1355       Knowledge Questionnaire Score   Pre Score 22/24             Core Components/Risk Factors/Patient Goals at Admission:  Personal Goals and Risk Factors at Admission - 06/26/21 1356       Core Components/Risk Factors/Patient Goals on Admission    Weight Management Weight Maintenance    Improve shortness of breath with ADL's Yes    Intervention Provide education, individualized exercise plan and daily activity instruction to help decrease symptoms of SOB with activities of daily living.    Expected Outcomes Short Term: Improve cardiorespiratory fitness to achieve a reduction of symptoms when performing ADLs;Long Term: Be able to perform more ADLs without symptoms or delay the onset of symptoms    Diabetes Yes    Intervention Provide education about signs/symptoms and action to take for hypo/hyperglycemia.;Provide education about proper nutrition, including hydration, and aerobic/resistive exercise prescription along with prescribed medications to achieve blood glucose in normal ranges: Fasting glucose 65-99 mg/dL    Expected Outcomes Short Term: Participant verbalizes understanding of the signs/symptoms and immediate care of hyper/hypoglycemia, proper foot care and  importance of medication, aerobic/resistive exercise and nutrition plan for blood glucose control.    Lipids Yes    Intervention Provide education and support for participant on nutrition & aerobic/resistive exercise along with prescribed  medications to achieve LDL 70mg , HDL >40mg .    Expected Outcomes Short Term: Participant states understanding of desired cholesterol values and is compliant with medications prescribed. Participant is following exercise prescription and nutrition guidelines.;Long Term: Cholesterol controlled with medications as prescribed, with individualized exercise RX and with personalized nutrition plan. Value goals: LDL < 70mg , HDL > 40 mg.    Personal Goal Other Yes    Personal Goal Patient wants to improve his strength and stamina and be able to do his ADL's and activities outside in the Spring like gardening and yardwork.    Intervention Patient will attend CR 3 days/week with exercise and education and will supplement with exercise at home.    Expected Outcomes Lisabeth Register, RN, BSN             Core Components/Risk Factors/Patient Goals Review:    Core Components/Risk Factors/Patient Goals at Discharge (Final Review):    ITP Comments:   Comments: Patient arrived for 1st visit/orientation/education at 1230. Patient was referred to CR by Dr. Kipp Brood due to S/P CABGx4 (Z95.1). During orientation advised patient on arrival and appointment times what to wear, what to do before, during and after exercise. Reviewed attendance and class policy.  Pt is scheduled to return Cardiac Rehab on 06/29/20 at 11:00. Pt was advised to come to class 15 minutes before class starts.  Discussed RPE/Dpysnea scales. Patient participated in warm up stretches. Patient was able to complete 6 minute walk test.  Telemetry:NSR with LBBB and frequent PVC's. Patient was measured for the equipment. Discussed equipment safety with patient. Took patient pre-anthropometric measurements. Patient finished visit at 1400.

## 2021-06-29 ENCOUNTER — Encounter (HOSPITAL_COMMUNITY)
Admission: RE | Admit: 2021-06-29 | Discharge: 2021-06-29 | Disposition: A | Discharge status: Home / Self Care | Payer: Medicare PPO | Source: Ambulatory Visit | Attending: Cardiology | Admitting: Cardiology

## 2021-06-29 VITALS — Wt 182.1 lb

## 2021-06-29 DIAGNOSIS — Z79899 Other long term (current) drug therapy: Secondary | ICD-10-CM | POA: Diagnosis not present

## 2021-06-29 DIAGNOSIS — Z951 Presence of aortocoronary bypass graft: Secondary | ICD-10-CM

## 2021-06-29 NOTE — Progress Notes (Signed)
Daily Session Note  Patient Details  Name: Derek Bond MRN: [id number] Date of Birth: [birth year] Referring Provider:   Flowsheet Row CARDIAC REHAB PHASE II ORIENTATION from 06/26/2021 in [place]  Referring Provider Dr. Kipp Brood       Encounter Date: 06/29/2021  Check In:  Session Check In - 06/29/21 1100       Check-In   Supervising physician immediately available to respond to emergencies [facility] MD immediately available    Physician(s) Dr. Harl Bowie    Location [facility]    Staff Present [name], MS, ACSM-CEP, Exercise Physiologist;Carlie Solorzano Zigmund Daniel, Exercise Physiologist;[name], RN, Madlyn Frankel, RN, BSN    Virtual Visit No    Medication changes reported     No    Fall or balance concerns reported    Yes    Comments Patient says he loses his balance often and gets dizzy alot. He ambulates using a straight cane.    Tobacco Cessation No Change    Warm-up and Cool-down Performed as group-led instruction    Resistance Training Performed Yes    VAD Patient? No    PAD/SET Patient? No      Pain Assessment   Currently in Pain? No/denies    Pain Score 0-No pain    Multiple Pain Sites No             Capillary Blood Glucose: No results found for this or any previous visit (from the past 24 hour(s)).    Social History   Tobacco Use  Smoking Status Former   Packs/day: 2.00   Years: 30.00   Pack years: 60.00   Types: Cigarettes   Quit date: 05/17/1989   Years since quitting: 32.1  Smokeless Tobacco Never    Goals Met:  Independence with exercise equipment Exercise tolerated well No report of concerns or symptoms today Strength training completed today  Goals Unmet:  Not Applicable  Comments: check out 1200   Dr. Carlyle Dolly is Medical Director for [facility]

## 2021-07-01 ENCOUNTER — Encounter (HOSPITAL_COMMUNITY)
Admission: RE | Admit: 2021-07-01 | Discharge: 2021-07-01 | Disposition: A | Discharge status: Home / Self Care | Payer: Medicare PPO | Source: Ambulatory Visit | Attending: Cardiology | Admitting: Cardiology

## 2021-07-01 DIAGNOSIS — Z951 Presence of aortocoronary bypass graft: Secondary | ICD-10-CM | POA: Diagnosis not present

## 2021-07-01 DIAGNOSIS — Z79899 Other long term (current) drug therapy: Secondary | ICD-10-CM | POA: Diagnosis not present

## 2021-07-01 NOTE — Progress Notes (Signed)
Daily Session Note  Patient Details  Name: Derek Bond MRN: [id number] Date of Birth: March 23, 1946 Referring Provider:   Flowsheet Row CARDIAC REHAB PHASE II ORIENTATION from 06/26/2021 in [place]  Referring Provider Dr. Kipp Brood       Encounter Date: 07/01/2021  Check In:  Session Check In - 07/01/21 1100       Check-In   Supervising physician immediately available to respond to emergencies [facility] MD immediately available    Physician(s) Dr. Harl Bowie    Location [facility]    Staff Present Redge Gainer, BS, Exercise Physiologist;Dalton Kris Mouton, MS, ACSM-CEP, Exercise Physiologist;Jakylan Ron Wynetta Emery, RN, BSN    Virtual Visit No    Medication changes reported     No    Fall or balance concerns reported    Yes    Comments Patient says he loses his balance often and gets dizzy alot. He ambulates using a straight cane.    Tobacco Cessation No Change    Warm-up and Cool-down Performed as group-led instruction    Resistance Training Performed Yes    VAD Patient? No    PAD/SET Patient? No      Pain Assessment   Currently in Pain? No/denies    Pain Score 0-No pain    Multiple Pain Sites No             Capillary Blood Glucose: No results found for this or any previous visit (from the past 24 hour(s)).    Social History   Tobacco Use  Smoking Status Former   Packs/day: 2.00   Years: 30.00   Pack years: 60.00   Types: Cigarettes   Quit date: 05/17/1989   Years since quitting: 32.1  Smokeless Tobacco Never    Goals Met:  Independence with exercise equipment Exercise tolerated well No report of concerns or symptoms today Strength training completed today  Goals Unmet:  Not Applicable  Comments: Check out 1200.   Dr. Carlyle Dolly is Medical Director for [facility]

## 2021-07-03 ENCOUNTER — Encounter (HOSPITAL_COMMUNITY)
Admission: RE | Admit: 2021-07-03 | Discharge: 2021-07-03 | Disposition: A | Discharge status: Home / Self Care | Payer: Medicare PPO | Source: Ambulatory Visit | Attending: Cardiology | Admitting: Cardiology

## 2021-07-03 DIAGNOSIS — Z79899 Other long term (current) drug therapy: Secondary | ICD-10-CM | POA: Diagnosis not present

## 2021-07-03 DIAGNOSIS — Z951 Presence of aortocoronary bypass graft: Secondary | ICD-10-CM | POA: Diagnosis not present

## 2021-07-03 NOTE — Progress Notes (Signed)
Daily Session Note  Patient Details  Name: Derek Bond MRN: [id number] Date of Birth: 11-Mar-1946 Referring Provider:   Flowsheet Row CARDIAC REHAB PHASE II ORIENTATION from 06/26/2021 in [place]  Referring Provider Dr. Kipp Brood       Encounter Date: 07/03/2021  Check In:  Session Check In - 07/03/21 1100       Check-In   Supervising physician immediately available to respond to emergencies [facility] MD immediately available    Physician(s) Dr. Harrington Challenger    Location [facility]    Staff Present Geanie Cooley, RN;Dalton Kris Mouton, MS, ACSM-CEP, Exercise Physiologist;Heather Zigmund Daniel, Exercise Physiologist    Virtual Visit No    Medication changes reported     No    Fall or balance concerns reported    Yes    Comments Patient says he loses his balance often and gets dizzy alot. He ambulates using a straight cane.    Tobacco Cessation No Change    Warm-up and Cool-down Performed as group-led instruction    Resistance Training Performed Yes    VAD Patient? No    PAD/SET Patient? No      Pain Assessment   Currently in Pain? No/denies    Pain Score 0-No pain    Multiple Pain Sites No             Capillary Blood Glucose: No results found for this or any previous visit (from the past 24 hour(s)).    Social History   Tobacco Use  Smoking Status Former   Packs/day: 2.00   Years: 30.00   Pack years: 60.00   Types: Cigarettes   Quit date: 05/17/1989   Years since quitting: 32.1  Smokeless Tobacco Never    Goals Met:  Independence with exercise equipment Exercise tolerated well No report of concerns or symptoms today Strength training completed today  Goals Unmet:  Not Applicable  Comments: check out @ 12:00pm   Dr. Carlyle Dolly is Medical Director for [facility]

## 2021-07-04 ENCOUNTER — Other Ambulatory Visit: Payer: Self-pay | Admitting: Cardiology

## 2021-07-06 ENCOUNTER — Encounter (HOSPITAL_COMMUNITY)
Admission: RE | Admit: 2021-07-06 | Discharge: 2021-07-06 | Disposition: A | Discharge status: Home / Self Care | Payer: Medicare PPO | Source: Ambulatory Visit | Attending: Cardiology | Admitting: Cardiology

## 2021-07-06 DIAGNOSIS — Z951 Presence of aortocoronary bypass graft: Secondary | ICD-10-CM | POA: Diagnosis not present

## 2021-07-06 DIAGNOSIS — Z79899 Other long term (current) drug therapy: Secondary | ICD-10-CM | POA: Diagnosis not present

## 2021-07-06 MED ORDER — MAGNESIUM OXIDE 400 MG PO CAPS: 400.0000 mg | ORAL_CAPSULE | Freq: Every day | ORAL | 0 refills | Status: DC

## 2021-07-06 NOTE — Progress Notes (Signed)
Daily Session Note  Patient Details  Name: Derek Bond MRN: [id number] Date of Birth: [birth year] Referring Provider:   Flowsheet Row CARDIAC REHAB PHASE II ORIENTATION from 06/26/2021 in [place]  Referring Provider Dr. Kipp Brood       Encounter Date: 07/06/2021  Check In:  Session Check In - 07/06/21 1100       Check-In   Supervising physician immediately available to respond to emergencies [facility] MD immediately available    Physician(s) Dr. Harl Bowie    Location [facility]    Staff Present [name], MS, ACSM-CEP, Exercise Physiologist;Heather Zigmund Daniel, Exercise Physiologist;Jahni Paul Wynetta Emery, RN, BSN    Virtual Visit No    Medication changes reported     No    Fall or balance concerns reported    Yes    Comments Patient says he loses his balance often and gets dizzy alot. He ambulates using a straight cane.    Tobacco Cessation No Change    Warm-up and Cool-down Performed as group-led instruction    Resistance Training Performed Yes    VAD Patient? No    PAD/SET Patient? No      Pain Assessment   Currently in Pain? No/denies    Pain Score 0-No pain    Multiple Pain Sites No             Capillary Blood Glucose: No results found for this or any previous visit (from the past 24 hour(s)).    Social History   Tobacco Use  Smoking Status Former   Packs/day: 2.00   Years: 30.00   Pack years: 60.00   Types: Cigarettes   Quit date: 05/17/1989   Years since quitting: 32.1  Smokeless Tobacco Never    Goals Met:  Independence with exercise equipment Exercise tolerated well No report of concerns or symptoms today Strength training completed today  Goals Unmet:  Not Applicable  Comments: Check out 1200.   Dr. Carlyle Dolly is Medical Director for [facility]

## 2021-07-06 NOTE — Progress Notes (Signed)
I have reviewed a Home Exercise Prescription with Derek Bond . Derek Bond is  currently exercising at home.  The patient was advised to walk 5 days a week for 30-45 minutes.  Derek Bond and I discussed how to progress their exercise prescription.  The patient stated that their goals were build his endurance and strength.  The patient stated that they understand the exercise prescription.  We reviewed exercise guidelines, target heart rate during exercise, RPE Scale, weather conditions, NTG use, endpoints for exercise, warmup and cool down.  Patient is encouraged to come to me with any questions. I will continue to follow up with the patient to assist them with progression and safety.

## 2021-07-07 ENCOUNTER — Ambulatory Visit: Payer: Medicare PPO | Admitting: Nutrition

## 2021-07-08 ENCOUNTER — Encounter (HOSPITAL_COMMUNITY)
Admission: RE | Admit: 2021-07-08 | Discharge: 2021-07-08 | Disposition: A | Discharge status: Home / Self Care | Payer: Medicare PPO | Source: Ambulatory Visit | Attending: Cardiology | Admitting: Cardiology

## 2021-07-08 DIAGNOSIS — Z951 Presence of aortocoronary bypass graft: Secondary | ICD-10-CM

## 2021-07-08 DIAGNOSIS — Z79899 Other long term (current) drug therapy: Secondary | ICD-10-CM | POA: Diagnosis not present

## 2021-07-08 NOTE — Progress Notes (Signed)
Daily Session Note  Patient Details  Name: Derek Bond MRN: [id number] Date of Birth: 1945-06-22 Referring Provider:   Flowsheet Row CARDIAC REHAB PHASE II ORIENTATION from 06/26/2021 in [place]  Referring Provider Dr. Kipp Brood       Encounter Date: 07/08/2021  Check In:  Session Check In - 07/08/21 1100       Check-In   Supervising physician immediately available to respond to emergencies [facility] MD immediately available    Physician(s) Dr. Domenic Polite    Location [facility]    Staff Present [name], MS, ACSM-CEP, Exercise Physiologist;Heather Otho Ket, BS, Exercise Physiologist;[name], RN, BSN    Virtual Visit No    Medication changes reported     No    Fall or balance concerns reported    Yes    Comments Patient says he loses his balance often and gets dizzy alot. He ambulates using a straight cane.    Tobacco Cessation No Change    Warm-up and Cool-down Performed as group-led instruction    Resistance Training Performed Yes    VAD Patient? No    PAD/SET Patient? No      Pain Assessment   Currently in Pain? No/denies    Pain Score 0-No pain    Multiple Pain Sites No             Capillary Blood Glucose: No results found for this or any previous visit (from the past 24 hour(s)).    Social History   Tobacco Use  Smoking Status Former   Packs/day: 2.00   Years: 30.00   Pack years: 60.00   Types: Cigarettes   Quit date: 05/17/1989   Years since quitting: 32.1  Smokeless Tobacco Never    Goals Met:  Independence with exercise equipment Exercise tolerated well No report of concerns or symptoms today Strength training completed today  Goals Unmet:  Not Applicable  Comments: checkout time is 1200   Dr. Carlyle Dolly is Medical Director for [facility]

## 2021-07-10 ENCOUNTER — Encounter (HOSPITAL_COMMUNITY)
Admission: RE | Admit: 2021-07-10 | Discharge: 2021-07-10 | Disposition: A | Discharge status: Home / Self Care | Payer: Medicare PPO | Source: Ambulatory Visit | Attending: Cardiology | Admitting: Cardiology

## 2021-07-10 DIAGNOSIS — Z951 Presence of aortocoronary bypass graft: Secondary | ICD-10-CM | POA: Diagnosis not present

## 2021-07-10 DIAGNOSIS — Z79899 Other long term (current) drug therapy: Secondary | ICD-10-CM | POA: Diagnosis not present

## 2021-07-10 NOTE — Progress Notes (Signed)
Daily Session Note  Patient Details  Name: Derek Bond MRN: [id number] Date of Birth: 1946-04-03 Referring Provider:   Flowsheet Row CARDIAC REHAB PHASE II ORIENTATION from 06/26/2021 in [place]  Referring Provider Dr. Kipp Brood       Encounter Date: 07/10/2021  Check In:  Session Check In - 07/10/21 1100       Check-In   Supervising physician immediately available to respond to emergencies [facility] MD immediately available    Physician(s) Dr. Domenic Polite    Location [facility]    Staff Present Redge Gainer, BS, Exercise Physiologist;Nia Nathaniel Wynetta Emery, RN, BSN;Other   Benay Pillow   Virtual Visit No    Medication changes reported     No    Fall or balance concerns reported    Yes    Comments Patient says he loses his balance often and gets dizzy alot. He ambulates using a straight cane.    Tobacco Cessation No Change    Warm-up and Cool-down Performed as group-led instruction    Resistance Training Performed Yes    VAD Patient? No    PAD/SET Patient? No      Pain Assessment   Currently in Pain? No/denies    Pain Score 0-No pain    Multiple Pain Sites No             Capillary Blood Glucose: No results found for this or any previous visit (from the past 24 hour(s)).    Social History   Tobacco Use  Smoking Status Former   Packs/day: 2.00   Years: 30.00   Pack years: 60.00   Types: Cigarettes   Quit date: 05/17/1989   Years since quitting: 32.1  Smokeless Tobacco Never    Goals Met:  Independence with exercise equipment Exercise tolerated well No report of concerns or symptoms today Strength training completed today  Goals Unmet:  Not Applicable  Comments: Check out 1200.   Dr. Carlyle Dolly is Medical Director for [facility]

## 2021-07-13 ENCOUNTER — Encounter (HOSPITAL_COMMUNITY)
Admission: RE | Admit: 2021-07-13 | Discharge: 2021-07-13 | Disposition: A | Discharge status: Home / Self Care | Payer: Medicare PPO | Source: Ambulatory Visit | Attending: Cardiology | Admitting: Cardiology

## 2021-07-13 VITALS — Wt 178.6 lb

## 2021-07-13 DIAGNOSIS — Z951 Presence of aortocoronary bypass graft: Secondary | ICD-10-CM | POA: Diagnosis not present

## 2021-07-13 DIAGNOSIS — Z79899 Other long term (current) drug therapy: Secondary | ICD-10-CM | POA: Diagnosis not present

## 2021-07-13 NOTE — Progress Notes (Signed)
Daily Session Note  Patient Details  Name: Derek Bond MRN: [id number] Date of Birth: [birth year] Referring Provider:   Flowsheet Row CARDIAC REHAB PHASE II ORIENTATION from 06/26/2021 in [place]  Referring Provider Dr. Kipp Brood       Encounter Date: 07/13/2021  Check In:  Session Check In - 07/13/21 1100       Check-In   Supervising physician immediately available to respond to emergencies [facility] MD immediately available    Physician(s) Dr Harl Bowie    Location [facility]    Staff Present Jilda Roche, RN, Bjorn Loser, MS, ACSM-CEP, Exercise Physiologist;Heather Zigmund Kassady Laboy, Exercise Physiologist    Virtual Visit No    Medication changes reported     No    Fall or balance concerns reported    Yes    Comments Patient says he loses his balance often and gets dizzy alot. He ambulates using a straight cane.    Tobacco Cessation No Change    Warm-up and Cool-down Performed as group-led instruction    Resistance Training Performed Yes    VAD Patient? No    PAD/SET Patient? No      Pain Assessment   Currently in Pain? No/denies    Pain Score 0-No pain             Capillary Blood Glucose: No results found for this or any previous visit (from the past 24 hour(s)).    Social History   Tobacco Use  Smoking Status Former   Packs/day: 2.00   Years: 30.00   Pack years: 60.00   Types: Cigarettes   Quit date: 05/17/1989   Years since quitting: 32.1  Smokeless Tobacco Never    Goals Met:  Independence with exercise equipment Exercise tolerated well No report of concerns or symptoms today  Goals Unmet:  Not Applicable  Comments: checkout 1200    Dr. Carlyle Dolly is Medical Director for [facility]

## 2021-07-14 DIAGNOSIS — I10 Essential (primary) hypertension: Secondary | ICD-10-CM | POA: Diagnosis not present

## 2021-07-14 DIAGNOSIS — E1165 Type 2 diabetes mellitus with hyperglycemia: Secondary | ICD-10-CM | POA: Diagnosis not present

## 2021-07-15 ENCOUNTER — Encounter (HOSPITAL_COMMUNITY)
Admission: RE | Admit: 2021-07-15 | Discharge: 2021-07-15 | Disposition: A | Discharge status: Home / Self Care | Payer: Medicare PPO | Source: Ambulatory Visit | Attending: Cardiology | Admitting: Cardiology

## 2021-07-15 DIAGNOSIS — Z951 Presence of aortocoronary bypass graft: Secondary | ICD-10-CM | POA: Diagnosis not present

## 2021-07-15 NOTE — Progress Notes (Signed)
Cardiac Individual Treatment Plan  Patient Details  Name: Derek Bond [facility] MRN: [id number] Date of Birth: 1945-09-18 Referring Provider:   Flowsheet Row CARDIAC REHAB PHASE II ORIENTATION from 06/26/2021 in [place]  Referring Provider Dr. Kipp Brood       Initial Encounter Date:  Flowsheet Row CARDIAC REHAB PHASE II ORIENTATION from 06/26/2021 in [place]  Date 06/26/21       Visit Diagnosis: S/P CABG x 4  Patient's Home Medications on Admission:  Current Outpatient Medications:    acetaminophen (TYLENOL) 500 MG tablet, Take 1,000 mg by mouth every 6 (six) hours as needed for moderate pain or mild pain., Disp: , Rfl:    ALPRAZolam (XANAX) 0.5 MG tablet, Take 0.5 mg by mouth at bedtime as needed for sleep., Disp: , Rfl:    amiodarone (PACERONE) 200 MG tablet, Take 0.5 tablets (100 mg total) by mouth daily., Disp: 30 tablet, Rfl: 0   apixaban (ELIQUIS) 5 MG TABS tablet, Take 1 tablet (5 mg total) by mouth 2 (two) times daily., Disp: 180 tablet, Rfl: 0   aspirin EC 81 MG tablet, Take 81 mg by mouth daily., Disp: , Rfl:    benzonatate (TESSALON) 200 MG capsule, Take 200 mg by mouth 3 (three) times daily as needed for cough., Disp: , Rfl:    dapagliflozin propanediol (FARXIGA) 10 MG TABS tablet, Take 1 tablet (10 mg total) by mouth daily., Disp: 90 tablet, Rfl: 0   famotidine (PEPCID) 40 MG tablet, Take 40 mg by mouth at bedtime., Disp: , Rfl:    feeding supplement (ENSURE ENLIVE / ENSURE PLUS) LIQD, Take 237 mLs by mouth 2 (two) times daily between meals. Until oral intake improves, Disp: 237 mL, Rfl: 12   furosemide (LASIX) 40 MG tablet, TAKE 1 TABLET BY MOUTH EVERY DAY, Disp: 30 tablet, Rfl: 0   glipiZIDE (GLUCOTROL XL) 10 MG 24 hr tablet, Take 10 mg by mouth 2 (two) times daily., Disp: , Rfl:    KLOR-CON M10 10 MEQ tablet, TAKE 1 TABLET BY MOUTH EVERY DAY, Disp: 30 tablet, Rfl: 0   Magnesium Oxide 400 MG CAPS, Take 1 capsule (400 mg  total) by mouth daily., Disp: 90 capsule, Rfl: 0   metFORMIN (GLUCOPHAGE) 1000 MG tablet, Take 1,000 mg by mouth daily with breakfast., Disp: , Rfl:    metoprolol succinate (TOPROL-XL) 25 MG 24 hr tablet, Take 1 tablet (25 mg total) by mouth every morning., Disp: 90 tablet, Rfl: 0   pantoprazole (PROTONIX) 40 MG tablet, Take 40 mg by mouth every evening., Disp: , Rfl:    rosuvastatin (CRESTOR) 5 MG tablet, Take 5 mg by mouth at bedtime., Disp: , Rfl:    sacubitril-valsartan (ENTRESTO) 97-103 MG, Take 1 tablet by mouth 2 (two) times daily., Disp: 180 tablet, Rfl: 1  Past Medical History: Past Medical History:  Diagnosis Date   Atrial fibrillation (HCC)    CAD (coronary artery disease)    Mild nonobstructive disease at cardiac catheterization 2009   Cancer [facility])    Melanoma   Essential hypertension    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    Due to laceration at age 59    Hiatal hernia    History of melanoma    Obstructive sleep apnea    Type 2 diabetes mellitus (HCC)     Tobacco Use: Social History   Tobacco Use  Smoking Status Former   Packs/day: 2.00   Years: 30.00   Pack years: 60.00   Types:  Cigarettes   Quit date: 05/17/1989   Years since quitting: 32.1  Smokeless Tobacco Never    Labs: Recent Review Flowsheet Data     Labs for ITP Cardiac and Pulmonary Rehab Latest Ref Rng & Units 02/26/2021 02/26/2021 02/26/2021 02/26/2021 06/10/2021   Cholestrol 0 - 200 mg/dL - - - - -   LDLCALC 0 - 99 mg/dL - - - - -   HDL >40 mg/dL - - - - -   Trlycerides <150 mg/dL - - - - -   Hemoglobin A1c 4.8 - 5.6 % - - - - -   PHART 7.350 - 7.450 7.436 - 7.425 7.339(L) -   PCO2ART 32.0 - 48.0 mmHg 39.7 - 37.5 33.9 -   HCO3 20.0 - 28.0 mmol/L 26.7 - 24.6 18.2(L) -   TCO2 22 - 32 mmol/L 28 27 26  19(L) 25   ACIDBASEDEF 0.0 - 2.0 mmol/L - - - 7.0(H) -   O2SAT % 100.0 - 99.0 99.0 -       Capillary Blood Glucose: Lab Results  Component Value Date   GLUCAP 241 (H) 06/26/2021   GLUCAP 125 (H)  06/10/2021   GLUCAP 148 (H) 04/14/2021   GLUCAP 131 (H) 04/14/2021   GLUCAP 167 (H) 04/02/2021     Exercise Target Goals: Exercise Program Goal: Individual exercise prescription set using results from initial 6 min walk test and THRR while considering  patients activity barriers and safety.   Exercise Prescription Goal: Starting with aerobic activity 30 plus minutes a day, 3 days per week for initial exercise prescription. Provide home exercise prescription and guidelines that participant acknowledges understanding prior to discharge.  Activity Barriers & Risk Stratification:  Activity Barriers & Cardiac Risk Stratification - 06/26/21 1250       Activity Barriers & Cardiac Risk Stratification   Activity Barriers Left Knee Replacement;Deconditioning;Shortness of Breath;Balance Concerns;Assistive Device;Chest Pain/Angina    Cardiac Risk Stratification High             6 Minute Walk:  6 Minute Walk     Row Name 06/26/21 1407         6 Minute Walk   Phase Initial     Distance 800 feet     Walk Time 6 minutes     MPH 1.5     METS 2.16     RPE 11     VO2 Peak 7.56     Symptoms No     Resting HR 72 bpm     Resting BP 100/50     Resting Oxygen Saturation  94 %     Exercise Oxygen Saturation  during 6 min walk 95 %     Max Ex. HR 85 bpm     Max Ex. BP 115/58     2 Minute Post BP 102/58              Oxygen Initial Assessment:   Oxygen Re-Evaluation:   Oxygen Discharge (Final Oxygen Re-Evaluation):   Initial Exercise Prescription:  Initial Exercise Prescription - 06/26/21 1400       Date of Initial Exercise RX and Referring Provider   Date 06/26/21    Referring Provider Dr. Kipp Brood    Expected Discharge Date 09/11/21      NuStep   Level 1    SPM 60    Minutes 22      Arm Ergometer   Level 1    RPM 50    Minutes 17  Prescription Details   Frequency (times per week) 3    Duration Progress to 30 minutes of continuous aerobic without  signs/symptoms of physical distress      Intensity   THRR 40-80% of Max Heartrate 58-116    Ratings of Perceived Exertion 11-13    Perceived Dyspnea 0-4      Resistance Training   Training Prescription Yes    Weight 2    Reps 10-15             Perform Capillary Blood Glucose checks as needed.  Exercise Prescription Changes:   Exercise Prescription Changes     Row Name 06/29/21 1400 07/06/21 1100 07/13/21 1200         Response to Exercise   Blood Pressure (Admit) 128/70 -- 116/68     Blood Pressure (Exercise) 125/70 -- 124/64     Blood Pressure (Exit) 120/60 -- 110/62     Heart Rate (Admit) 84 bpm -- 84 bpm     Heart Rate (Exercise) 89 bpm -- 102 bpm     Heart Rate (Exit) 81 bpm -- 93 bpm     Rating of Perceived Exertion (Exercise) 12 -- 13     Duration Continue with 30 min of aerobic exercise without signs/symptoms of physical distress. -- Continue with 30 min of aerobic exercise without signs/symptoms of physical distress.     Intensity THRR unchanged -- THRR unchanged       Progression   Progression Continue to progress workloads to maintain intensity without signs/symptoms of physical distress. -- Continue to progress workloads to maintain intensity without signs/symptoms of physical distress.       Resistance Training   Training Prescription Yes -- Yes     Weight 2 -- 3     Reps 10-15 -- 10-15     Time 10 Minutes -- 10 Minutes       NuStep   Level 1 -- 3     SPM 96 -- 102     Minutes 17 -- 17     METs 2.7 -- 3.15       Arm Ergometer   Level 1 -- 2     RPM 54 -- 57     Minutes 22 -- 22     METs 1.7 -- 2.22       Home Exercise Plan   Plans to continue exercise at -- Home (comment) --     Frequency -- Add 2 additional days to program exercise sessions. --     Initial Home Exercises Provided -- 07/06/21 --              Exercise Comments:   Exercise Comments     Row Name 07/06/21 1135           Exercise Comments home exercise reviewed                 Exercise Goals and Review:   Exercise Goals     Row Name 06/26/21 1412 07/13/21 1249           Exercise Goals   Increase Physical Activity Yes Yes      Intervention Provide advice, education, support and counseling about physical activity/exercise needs.;Develop an individualized exercise prescription for aerobic and resistive training based on initial evaluation findings, risk stratification, comorbidities and participant's personal goals. Provide advice, education, support and counseling about physical activity/exercise needs.;Develop an individualized exercise prescription for aerobic and resistive training based on initial evaluation findings, risk stratification, comorbidities and  participant's personal goals.      Expected Outcomes Short Term: Attend rehab on a regular basis to increase amount of physical activity.;Long Term: Add in home exercise to make exercise part of routine and to increase amount of physical activity.;Long Term: Exercising regularly at least 3-5 days a week. Short Term: Attend rehab on a regular basis to increase amount of physical activity.;Long Term: Add in home exercise to make exercise part of routine and to increase amount of physical activity.;Long Term: Exercising regularly at least 3-5 days a week.      Increase Strength and Stamina Yes Yes      Intervention Provide advice, education, support and counseling about physical activity/exercise needs.;Develop an individualized exercise prescription for aerobic and resistive training based on initial evaluation findings, risk stratification, comorbidities and participant's personal goals. Provide advice, education, support and counseling about physical activity/exercise needs.;Develop an individualized exercise prescription for aerobic and resistive training based on initial evaluation findings, risk stratification, comorbidities and participant's personal goals.      Expected Outcomes Short Term:  Increase workloads from initial exercise prescription for resistance, speed, and METs.;Short Term: Perform resistance training exercises routinely during rehab and add in resistance training at home;Long Term: Improve cardiorespiratory fitness, muscular endurance and strength as measured by increased METs and functional capacity (6MWT) Short Term: Increase workloads from initial exercise prescription for resistance, speed, and METs.;Short Term: Perform resistance training exercises routinely during rehab and add in resistance training at home;Long Term: Improve cardiorespiratory fitness, muscular endurance and strength as measured by increased METs and functional capacity (6MWT)      Able to understand and use rate of perceived exertion (RPE) scale Yes Yes      Intervention Provide education and explanation on how to use RPE scale Provide education and explanation on how to use RPE scale      Expected Outcomes Short Term: Able to use RPE daily in rehab to express subjective intensity level;Long Term:  Able to use RPE to guide intensity level when exercising independently Short Term: Able to use RPE daily in rehab to express subjective intensity level;Long Term:  Able to use RPE to guide intensity level when exercising independently      Knowledge and understanding of Target Heart Rate Range (THRR) Yes Yes      Intervention Provide education and explanation of THRR including how the numbers were predicted and where they are located for reference Provide education and explanation of THRR including how the numbers were predicted and where they are located for reference      Expected Outcomes Short Term: Able to state/look up THRR;Long Term: Able to use THRR to govern intensity when exercising independently;Short Term: Able to use daily as guideline for intensity in rehab Short Term: Able to state/look up THRR;Long Term: Able to use THRR to govern intensity when exercising independently;Short Term: Able to use  daily as guideline for intensity in rehab      Able to check pulse independently Yes Yes      Intervention Provide education and demonstration on how to check pulse in carotid and radial arteries.;Review the importance of being able to check your own pulse for safety during independent exercise Provide education and demonstration on how to check pulse in carotid and radial arteries.;Review the importance of being able to check your own pulse for safety during independent exercise      Expected Outcomes Short Term: Able to explain why pulse checking is important during independent exercise Short Term: Able to  explain why pulse checking is important during independent exercise      Understanding of Exercise Prescription Yes Yes      Intervention Provide education, explanation, and written materials on patient's individual exercise prescription Provide education, explanation, and written materials on patient's individual exercise prescription      Expected Outcomes Short Term: Able to explain program exercise prescription;Long Term: Able to explain home exercise prescription to exercise independently Short Term: Able to explain program exercise prescription;Long Term: Able to explain home exercise prescription to exercise independently               Exercise Goals Re-Evaluation :  Exercise Goals Re-Evaluation     Row Name 07/13/21 1250             Exercise Goal Re-Evaluation   Exercise Goals Review Increase Physical Activity;Increase Strength and Stamina;Able to understand and use rate of perceived exertion (RPE) scale;Knowledge and understanding of Target Heart Rate Range (THRR);Able to check pulse independently;Understanding of Exercise Prescription       Comments Pt has attended 8 sessions of cardiac rehab. He has shown an interest in improving his functioning. He has requested to increase his workloads a couple of times and is progressing well in the program. He is currently exercising at  3.15 METs. Will continue to monitor and progress as able.       Expected Outcomes Through exercise at rehab and at home, the patient will meet their stated goals.                 Discharge Exercise Prescription (Final Exercise Prescription Changes):  Exercise Prescription Changes - 07/13/21 1200       Response to Exercise   Blood Pressure (Admit) 116/68    Blood Pressure (Exercise) 124/64    Blood Pressure (Exit) 110/62    Heart Rate (Admit) 84 bpm    Heart Rate (Exercise) 102 bpm    Heart Rate (Exit) 93 bpm    Rating of Perceived Exertion (Exercise) 13    Duration Continue with 30 min of aerobic exercise without signs/symptoms of physical distress.    Intensity THRR unchanged      Progression   Progression Continue to progress workloads to maintain intensity without signs/symptoms of physical distress.      Resistance Training   Training Prescription Yes    Weight 3    Reps 10-15    Time 10 Minutes      NuStep   Level 3    SPM 102    Minutes 17    METs 3.15      Arm Ergometer   Level 2    RPM 57    Minutes 22    METs 2.22             Nutrition:  Target Goals: Understanding of nutrition guidelines, daily intake of sodium 1500mg , cholesterol 200mg , calories 30% from fat and 7% or less from saturated fats, daily to have 5 or more servings of fruits and vegetables.  Biometrics:  Pre Biometrics - 06/26/21 1412       Pre Biometrics   Height 6\' 6"  (1.981 m)    Weight 83 kg    Waist Circumference 43 inches    Hip Circumference 35 inches    Waist to Hip Ratio 1.23 %    BMI (Calculated) 21.15    Triceps Skinfold 6 mm    % Body Fat 22.6 %    Grip Strength 20.3 kg    Flexibility 8  in    Single Leg Stand 60 seconds              Nutrition Therapy Plan and Nutrition Goals:  Nutrition Therapy & Goals - 06/26/21 1352       Personal Nutrition Goals   Comments Patient scored 73 on his diet assessment. Score explained to patient and his grandson.  Handout provided and explained on healthier choices and DM control. Patient verbalized understanding. His daughter own a [employer] store and he eats there every meal except breakfast. He says he is willing to try some healthy changes but is probably not going to change his diet a lot. Assistance with RD referral offerred. Patient declined.      Intervention Plan   Intervention Nutrition handout(s) given to patient.    Expected Outcomes Short Term Goal: Understand basic principles of dietary content, such as calories, fat, sodium, cholesterol and nutrients.             Nutrition Assessments:  Nutrition Assessments - 06/26/21 1352       MEDFICTS Scores   Pre Score 73            MEDIFICTS Score Key: ?70 Need to make dietary changes  40-70 Heart Healthy Diet ? 40 Therapeutic Level Cholesterol Diet   Picture Your Plate Scores: <93 Unhealthy dietary pattern with much room for improvement. 41-50 Dietary pattern unlikely to meet recommendations for good health and room for improvement. 51-60 More healthful dietary pattern, with some room for improvement.  >60 Healthy dietary pattern, although there may be some specific behaviors that could be improved.    Nutrition Goals Re-Evaluation:   Nutrition Goals Discharge (Final Nutrition Goals Re-Evaluation):   Psychosocial: Target Goals: Acknowledge presence or absence of significant depression and/or stress, maximize coping skills, provide positive support system. Participant is able to verbalize types and ability to use techniques and skills needed for reducing stress and depression.  Initial Review & Psychosocial Screening:  Initial Psych Review & Screening - 06/26/21 1426       Initial Review   Current issues with None Identified      Family Dynamics   Good Support System? Yes      Barriers   Psychosocial barriers to participate in program There are no identifiable barriers or psychosocial needs.      Screening  Interventions   Interventions Encouraged to exercise;Provide feedback about the scores to participant    Expected Outcomes Short Term goal: Identification and review with participant of any Quality of Life or Depression concerns found by scoring the questionnaire.             Quality of Life Scores:  Quality of Life - 06/26/21 1413       Quality of Life   Select Quality of Life      Quality of Life Scores   Health/Function Pre 20.57 %    Socioeconomic Pre 21.79 %    Psych/Spiritual Pre 23.14 %    Family Pre 24 %    GLOBAL Pre 21.83 %            Scores of 19 and below usually indicate a poorer quality of life in these areas.  A difference of  2-3 points is a clinically meaningful difference.  A difference of 2-3 points in the total score of the Quality of Life Index has been associated with significant improvement in overall quality of life, self-image, physical symptoms, and general health in studies assessing change in quality of life.  PHQ-9: Recent Review Flowsheet Data     Depression screen [facility] 2/9 06/26/2021   Decreased Interest 1   Down, Depressed, Hopeless 1   PHQ - 2 Score 2   Altered sleeping 0   Tired, decreased energy 3   Change in appetite 0   Feeling bad or failure about yourself  0   Trouble concentrating 3   Moving slowly or fidgety/restless 2   Suicidal thoughts 0   PHQ-9 Score 10   Difficult doing work/chores Somewhat difficult      Interpretation of Total Score  Total Score Depression Severity:  1-4 = Minimal depression, 5-9 = Mild depression, 10-14 = Moderate depression, 15-19 = Moderately severe depression, 20-27 = Severe depression   Psychosocial Evaluation and Intervention:  Psychosocial Evaluation - 06/26/21 1426       Psychosocial Evaluation & Interventions   Interventions Stress management education;Relaxation education;Encouraged to exercise with the program and follow exercise prescription    Comments Patient has no psychosocial  barriers or issues identified at his orientation visit. He denies any depression, anxiety, or stress. His initial PHQ-9 score was 8 which he says is related to him not having the energy to do the things he used to do causing frustration. His overall QOL was 21.83% scoring lowest in health/function. He takes Alprazolam 0.5 mg as needed for sleep. His wife of 77 years passed away 3 years ago. His grandaughter moved in with  him after she died so he would not be alone and she has recently married and continues to live with him along with her brother. He has 2 daughters and 6 grandchildren. He names all of them as his support as well as a close friend. He says he is "well looked after". He is ready to start the program hoping to be able to do his yardwork and gardening this Spring.    Expected Outcomes Patient will continue to have no psychosocial barriers or issues identified.    Continue Psychosocial Services  No Follow up required             Psychosocial Re-Evaluation:  Psychosocial Re-Evaluation     [place] Name 07/06/21 1334             Psychosocial Re-Evaluation   Current issues with None Identified       Comments Patient is new to the program completing 4 sessions. He continues to have no psychosocial barriers or issues identified. He seems to enjoy coming to the sessions and demonstrates an interest in improving his health. We will continue to monitor.       Expected Outcomes Patient will continue to have no psychososcial barriers or issues identified.       Interventions Encouraged to attend Cardiac Rehabilitation for the exercise;Stress management education;Relaxation education       Continue Psychosocial Services  No Follow up required                Psychosocial Discharge (Final Psychosocial Re-Evaluation):  Psychosocial Re-Evaluation - 07/06/21 1334       Psychosocial Re-Evaluation   Current issues with None Identified    Comments Patient is new to the program completing 4  sessions. He continues to have no psychosocial barriers or issues identified. He seems to enjoy coming to the sessions and demonstrates an interest in improving his health. We will continue to monitor.    Expected Outcomes Patient will continue to have no psychososcial barriers or issues identified.    Interventions Encouraged to attend Cardiac  Rehabilitation for the exercise;Stress management education;Relaxation education    Continue Psychosocial Services  No Follow up required             Vocational Rehabilitation: Provide vocational rehab assistance to qualifying candidates.   Vocational Rehab Evaluation & Intervention:  Vocational Rehab - 06/26/21 1355       Initial Vocational Rehab Evaluation & Intervention   Assessment shows need for Vocational Rehabilitation No      Vocational Rehab Re-Evaulation   Comments Patient is retired and does not need vocational rehab.             Education: Education Goals: Education classes will be provided on a weekly basis, covering required topics. Participant will state understanding/return demonstration of topics presented.  Learning Barriers/Preferences:  Learning Barriers/Preferences - 06/26/21 1356       Learning Barriers/Preferences   Learning Barriers None    Learning Preferences Audio             Education Topics: Hypertension, Hypertension Reduction -Define heart disease and high blood pressure. Discus how high blood pressure affects the body and ways to reduce high blood pressure.   Exercise and Your Heart -Discuss why it is important to exercise, the FITT principles of exercise, normal and abnormal responses to exercise, and how to exercise safely.   Angina -Discuss definition of angina, causes of angina, treatment of angina, and how to decrease risk of having angina.   Cardiac Medications -Review what the following cardiac medications are used for, how they affect the body, and side effects that may occur  when taking the medications.  Medications include Aspirin, Beta blockers, calcium channel blockers, ACE Inhibitors, angiotensin receptor blockers, diuretics, digoxin, and antihyperlipidemics.   Congestive Heart Failure -Discuss the definition of CHF, how to live with CHF, the signs and symptoms of CHF, and how keep track of weight and sodium intake.   Heart Disease and Intimacy -Discus the effect sexual activity has on the heart, how changes occur during intimacy as we age, and safety during sexual activity.   Smoking Cessation / COPD -Discuss different methods to quit smoking, the health benefits of quitting smoking, and the definition of COPD. Flowsheet Row CARDIAC REHAB PHASE II EXERCISE from 07/08/2021 in [place]  Date 07/01/21  Educator [place]  Instruction Review Code 1- Verbalizes Understanding       Nutrition I: Fats -Discuss the types of cholesterol, what cholesterol does to the heart, and how cholesterol levels can be controlled. Flowsheet Row CARDIAC REHAB PHASE II EXERCISE from 07/08/2021 in [place]  Date 07/08/21  Educator DF  Instruction Review Code 2- Demonstrated Understanding       Nutrition II: Labels -Discuss the different components of food labels and how to read food label   Heart Parts/Heart Disease and PAD -Discuss the anatomy of the heart, the pathway of blood circulation through the heart, and these are affected by heart disease.   Stress I: Signs and Symptoms -Discuss the causes of stress, how stress may lead to anxiety and depression, and ways to limit stress.   Stress II: Relaxation -Discuss different types of relaxation techniques to limit stress.   Warning Signs of Stroke / TIA -Discuss definition of a stroke, what the signs and symptoms are of a stroke, and how to identify when someone is having stroke.   Knowledge Questionnaire Score:  Knowledge Questionnaire Score - 06/26/21 1355        Knowledge Questionnaire Score   Pre Score  22/24             Core Components/Risk Factors/Patient Goals at Admission:  Personal Goals and Risk Factors at Admission - 06/26/21 1356       Core Components/Risk Factors/Patient Goals on Admission    Weight Management Weight Maintenance    Improve shortness of breath with ADL's Yes    Intervention Provide education, individualized exercise plan and daily activity instruction to help decrease symptoms of SOB with activities of daily living.    Expected Outcomes Short Term: Improve cardiorespiratory fitness to achieve a reduction of symptoms when performing ADLs;Long Term: Be able to perform more ADLs without symptoms or delay the onset of symptoms    Diabetes Yes    Intervention Provide education about signs/symptoms and action to take for hypo/hyperglycemia.;Provide education about proper nutrition, including hydration, and aerobic/resistive exercise prescription along with prescribed medications to achieve blood glucose in normal ranges: Fasting glucose 65-99 mg/dL    Expected Outcomes Short Term: Participant verbalizes understanding of the signs/symptoms and immediate care of hyper/hypoglycemia, proper foot care and importance of medication, aerobic/resistive exercise and nutrition plan for blood glucose control.    Lipids Yes    Intervention Provide education and support for participant on nutrition & aerobic/resistive exercise along with prescribed medications to achieve LDL 70mg , HDL >40mg .    Expected Outcomes Short Term: Participant states understanding of desired cholesterol values and is compliant with medications prescribed. Participant is following exercise prescription and nutrition guidelines.;Long Term: Cholesterol controlled with medications as prescribed, with individualized exercise RX and with personalized nutrition plan. Value goals: LDL < 70mg , HDL > 40 mg.    Personal Goal Other Yes    Personal Goal Patient wants to improve  his strength and stamina and be able to do his ADL's and activities outside in the Spring like gardening and yardwork.    Intervention Patient will attend CR 3 days/week with exercise and education and will supplement with exercise at home.    Expected Outcomes Lisabeth Register, RN, BSN             Core Components/Risk Factors/Patient Goals Review:   Goals and Risk Factor Review     Row Name 07/06/21 1336             Core Components/Risk Factors/Patient Goals Review   Personal Goals Review Weight Management/Obesity;Diabetes;Heart Failure;Other;Improve shortness of breath with ADL's       Review Patient was referred to CR wtih CABGx4. He has multiple risk factors for CAD and is participating in the program for risk modification. His current weight is 182.4. He has completed 4 sessions. His blood pressure is at goals. His personal goals for the program are to increase his strength and stamina and be able to do his garden and lawn care this Spring and do his ADL's. We will continue to monitor his progress as he works towards meeting these goals.       Expected Outcomes Patient will complete the program meeting both personal and program goals.                Core Components/Risk Factors/Patient Goals at Discharge (Final Review):   Goals and Risk Factor Review - 07/06/21 1336       Core Components/Risk Factors/Patient Goals Review   Personal Goals Review Weight Management/Obesity;Diabetes;Heart Failure;Other;Improve shortness of breath with ADL's    Review Patient was referred to CR wtih CABGx4. He has multiple risk factors for CAD and is participating in the program for risk modification.  His current weight is 182.4. He has completed 4 sessions. His blood pressure is at goals. His personal goals for the program are to increase his strength and stamina and be able to do his garden and lawn care this Spring and do his ADL's. We will continue to monitor his progress as he works towards  meeting these goals.    Expected Outcomes Patient will complete the program meeting both personal and program goals.             ITP Comments:   Comments: ITP REVIEW Pt is making expected progress toward Cardiac Rehab goals after completing 8 sessions. Recommend continued exercise, life style modification, education, and increased stamina and strength.

## 2021-07-15 NOTE — Progress Notes (Signed)
Daily Session Note ? ?Patient Details  ?Name: Derek Bond ?MRN: [id number] ?Date of Birth: [birth year] ?Referring Provider:   ?Flowsheet Row CARDIAC REHAB PHASE II ORIENTATION from 06/26/2021 in [place]  ?Referring Provider Dr. Kipp Brood  ? ?  ? ? ?Encounter Date: 07/15/2021 ? ?Check In: ? Session Check In - 07/15/21 1100   ? ?  ? Check-In  ? Supervising physician immediately available to respond to emergencies [facility] MD immediately available   ? Physician(s) Dr Harl Bowie   ? Location [facility]   ? Staff Present [name], MS, ACSM-CEP, Exercise Physiologist;Heather Otho Ket, BS, Exercise Physiologist;Alonza Knisley Wynetta Emery, RN, BSN   ? Virtual Visit No   ? Medication changes reported     No   ? Fall or balance concerns reported    Yes   ? Comments Patient says he loses his balance often and gets dizzy alot. He ambulates using a straight cane.   ? Tobacco Cessation No Change   ? Warm-up and Cool-down Performed as group-led instruction   ? Resistance Training Performed Yes   ? VAD Patient? No   ? PAD/SET Patient? No   ?  ? Pain Assessment  ? Currently in Pain? No/denies   ? Pain Score 0-No pain   ? Multiple Pain Sites No   ? ?  ?  ? ?  ? ? ?Capillary Blood Glucose: ?No results found for this or any previous visit (from the past 24 hour(s)). ? ? ? ?Social History  ? ?Tobacco Use  ?Smoking Status Former  ? Packs/day: 2.00  ? Years: 30.00  ? Pack years: 60.00  ? Types: Cigarettes  ? Quit date: 05/17/1989  ? Years since quitting: 32.1  ?Smokeless Tobacco Never  ? ? ?Goals Met:  ?Independence with exercise equipment ?Exercise tolerated well ?No report of concerns or symptoms today ?Strength training completed today ? ?Goals Unmet:  ?Not Applicable ? ?Comments: Check out 1200. ? ? ?Dr. Carlyle Dolly is Medical Director for [place] ?

## 2021-07-17 ENCOUNTER — Encounter (HOSPITAL_COMMUNITY)
Admission: RE | Admit: 2021-07-17 | Discharge: 2021-07-17 | Disposition: A | Discharge status: Home / Self Care | Payer: Medicare PPO | Source: Ambulatory Visit | Attending: Cardiology | Admitting: Cardiology

## 2021-07-17 DIAGNOSIS — Z951 Presence of aortocoronary bypass graft: Secondary | ICD-10-CM

## 2021-07-17 NOTE — Progress Notes (Signed)
Daily Session Note ? ?Patient Details  ?Name: Derek Bond ?MRN: [id number] ?Date of Birth: Oct 19, 1945 ?Referring Provider:   ?Flowsheet Row CARDIAC REHAB PHASE II ORIENTATION from 06/26/2021 in [place]  ?Referring Provider Dr. Kipp Brood  ? ?  ? ? ?Encounter Date: 07/17/2021 ? ?Check In: ? Session Check In - 07/17/21 1105   ? ?  ? Check-In  ? Supervising physician immediately available to respond to emergencies [facility] MD immediately available   ? Physician(s) Dr Harl Bowie   ? Location [facility]   ? Staff Present [name], MS, ACSM-CEP, Exercise Physiologist;[name], RN, BSN;Kalyssa Anker, RN   ? Virtual Visit No   ? Medication changes reported     No   ? Fall or balance concerns reported    Yes   ? Comments Patient says he loses his balance often and gets dizzy alot. He ambulates using a straight cane.   ? Tobacco Cessation No Change   ? Warm-up and Cool-down Performed as group-led instruction   ? Resistance Training Performed Yes   ? VAD Patient? No   ? PAD/SET Patient? No   ?  ? Pain Assessment  ? Currently in Pain? No/denies   ? Pain Score 0-No pain   ? Multiple Pain Sites No   ? ?  ?  ? ?  ? ? ?Capillary Blood Glucose: ?No results found for this or any previous visit (from the past 24 hour(s)). ? ? ? ?Social History  ? ?Tobacco Use  ?Smoking Status Former  ? Packs/day: 2.00  ? Years: 30.00  ? Pack years: 60.00  ? Types: Cigarettes  ? Quit date: 05/17/1989  ? Years since quitting: 32.1  ?Smokeless Tobacco Never  ? ? ?Goals Met:  ?Independence with exercise equipment ?Exercise tolerated well ?No report of concerns or symptoms today ?Strength training completed today ? ?Goals Unmet:  ?Not Applicable ? ?Comments: check out @ 12:00pm ? ? ?Dr. Carlyle Dolly is Medical Director for [place] ?

## 2021-07-20 ENCOUNTER — Encounter (HOSPITAL_COMMUNITY)
Admission: RE | Admit: 2021-07-20 | Discharge: 2021-07-20 | Disposition: A | Discharge status: Home / Self Care | Payer: Medicare PPO | Source: Ambulatory Visit | Attending: Cardiology | Admitting: Cardiology

## 2021-07-20 ENCOUNTER — Encounter (HOSPITAL_COMMUNITY): Payer: Medicare PPO

## 2021-07-20 DIAGNOSIS — Z951 Presence of aortocoronary bypass graft: Secondary | ICD-10-CM

## 2021-07-20 NOTE — Progress Notes (Signed)
Daily Session Note ? ?Patient Details  ?Name: Derek Bond ?MRN: [id number] ?Date of Birth: 09/07/1945 ?Referring Provider:   ?Flowsheet Row CARDIAC REHAB PHASE II ORIENTATION from 06/26/2021 in [place]  ?Referring Provider Dr. Kipp Brood  ? ?  ? ? ?Encounter Date: 07/20/2021 ? ?Check In: ? Session Check In - 07/20/21 1059   ? ?  ? Check-In  ? Supervising physician immediately available to respond to emergencies [facility] MD immediately available   ? Physician(s) Dr Johnsie Cancel   ? Location [facility]   ? Staff Present Jilda Roche, RN, Bjorn Loser, MS, ACSM-CEP, Exercise Physiologist;Heather Zigmund Tylisa Alcivar, Exercise Physiologist   ? Virtual Visit No   ? Medication changes reported     No   ? Fall or balance concerns reported    Yes   ? Comments Patient says he loses his balance often and gets dizzy alot. He ambulates using a straight cane.   ? Tobacco Cessation No Change   ? Warm-up and Cool-down Performed as group-led instruction   ? Resistance Training Performed Yes   ? VAD Patient? No   ?  ? Pain Assessment  ? Currently in Pain? No/denies   ? Pain Score 0-No pain   ? ?  ?  ? ?  ? ? ?Capillary Blood Glucose: ?No results found for this or any previous visit (from the past 24 hour(s)). ? ? ? ?Social History  ? ?Tobacco Use  ?Smoking Status Former  ? Packs/day: 2.00  ? Years: 30.00  ? Pack years: 60.00  ? Types: Cigarettes  ? Quit date: 05/17/1989  ? Years since quitting: 32.1  ?Smokeless Tobacco Never  ? ? ?Goals Met:  ?Independence with exercise equipment ?Exercise tolerated well ?No report of concerns or symptoms today ? ?Goals Unmet:  ?Not Applicable ? ?Comments: checkout 1200 ? ? ?Dr. Carlyle Dolly is Medical Director for [place] ?

## 2021-07-21 ENCOUNTER — Encounter: Payer: Self-pay | Admitting: Pulmonary Disease

## 2021-07-21 ENCOUNTER — Other Ambulatory Visit: Payer: Self-pay

## 2021-07-21 ENCOUNTER — Ambulatory Visit (INDEPENDENT_AMBULATORY_CARE_PROVIDER_SITE_OTHER): Payer: Medicare PPO | Admitting: Pulmonary Disease

## 2021-07-21 VITALS — BP 130/68 | HR 96 | Temp 98.0°F | Ht 78.0 in | Wt 185.2 lb

## 2021-07-21 DIAGNOSIS — R0683 Snoring: Secondary | ICD-10-CM

## 2021-07-21 DIAGNOSIS — G4733 Obstructive sleep apnea (adult) (pediatric): Secondary | ICD-10-CM | POA: Diagnosis not present

## 2021-07-21 NOTE — Patient Instructions (Signed)
?  X home sleep study ?

## 2021-07-21 NOTE — Progress Notes (Signed)
? ?Subjective:  ? ? Patient ID: Derek Bond, male    DOB: Oct 17, 1945, 76 y.o.   MRN: [id number] ? ?HPI ? ?Chief Complaint  ?Patient presents with  ? Consult  ?  Ref by cardiology for sleep apnea consult. Pt has been dx with OSA in past over 10 years ago.   ? ? ?76 year old remote smoker referred by cardiology for evaluation of sleep disordered breathing. ?He carries a diagnosis of OSA when he was tested about 15 years ago, he weighed then about 300 pounds.  He lost about 100 pounds and then came off his CPAP machine. ?Cardiology consultation has been reviewed, he is on amiodarone and anticoagulation for paroxysmal atrial fibrillation.  He underwent CABG October 2022 and is currently undergoing cardiac rehab.  He lost 50 pounds, course was complicated by right lower extremity cellulitis.  He now uses a cane for balance. ? ?Epworth Sleepiness Scale is 13 and he reports sleepiness while watching TV, sitting and reading, lying down to rest in the afternoons. ?Bedtime is between 9 and 1030, sleep latency about 15 minutes, he sleeps on his side with 2 pillows, reports 4-5 nocturnal awakenings including nocturia and is out of bed by 8 AM with dryness of mouth feeling rested without headaches.  As the day goes by he felt sleepy and will occasionally take a nap he attributes this to recovering from surgery. ? ?There is no history suggestive of cataplexy, sleep paralysis or parasomnias ? ?Chest x-ray 06/03/2021 was reviewed which shows small left effusion, no definite infiltrate ? ? ? ? 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 ?Past Medical History:  ?Diagnosis Date  ? Atrial fibrillation ([place])   ? CAD (coronary artery disease)   ? Mild nonobstructive disease at cardiac catheterization 2009  ? Cancer [facility])   ? Melanoma  ? Essential  hypertension   ? 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 Due to laceration at age 71   ? Hiatal hernia   ? History of melanoma   ? Obstructive sleep apnea   ? Type 2 diabetes mellitus ([place])   ? ?Past Surgical History:  ?Procedure Laterality Date  ? BIOPSY  12/21/2019  ? Procedure: BIOPSY;  Surgeon: Montez Morita, Quillian Quince, MD;  [facility];  Service: Gastroenterology;;  random colon  ? CARDIOVERSION N/A 06/10/2021  ? Procedure: CARDIOVERSION;  Surgeon: Rex Kras, DO;  [facility];  Service: Cardiovascular;  Laterality: N/A;  ? CLIPPING OF ATRIAL APPENDAGE N/A 02/26/2021  ? Procedure: CLIPPING OF ATRIAL APPENDAGE USING ATRICURE CLIP SIZE 45;  Surgeon: Lajuana Matte, MD;  Location: [place];  Service: Open Heart Surgery;  Laterality: N/A;  ? COLONOSCOPY WITH PROPOFOL N/A 12/21/2019  ? Procedure: COLONOSCOPY WITH PROPOFOL;  Surgeon: Harvel Quale, MD;  [facility];  Service: Gastroenterology;  Laterality: N/A;  915  ? CORONARY ARTERY BYPASS GRAFT N/A 02/26/2021  ? Procedure: CORONARY ARTERY BYPASS GRAFTING (CABG) X4, ON PUMP, USING LEFT INTERNAL MAMMARY ARTERY AND RIGHT ENDOSCOPICALLY HARVESTED GREATER SAPHENOUS VEIN;  Surgeon: Lajuana Matte, MD;  Location: [place];  Service: Open Heart Surgery;  Laterality: N/A;  ? ENDOVEIN HARVEST OF GREATER SAPHENOUS VEIN Right 02/26/2021  ? Procedure: ENDOVEIN HARVEST OF GREATER SAPHENOUS VEIN;  Surgeon: Lajuana Matte, MD;  Location: [place];  Service: Open Heart Surgery;  Laterality:  Right;  ? MAZE N/A 02/26/2021  ? Procedure: MAZE;  Surgeon: Lajuana Matte, MD;  Location: [place];  Service: Open Heart Surgery;  Laterality: N/A;  ? MELANOMA EXCISION    ? Left facial region  ? REPLACEMENT TOTAL KNEE Left   ? RIGHT/LEFT HEART CATH AND CORONARY ANGIOGRAPHY N/A 02/23/2021  ? Procedure: RIGHT/LEFT HEART CATH AND CORONARY ANGIOGRAPHY;  Surgeon: Nigel Mormon, MD;  Location: [place] [facility];  Service: Cardiovascular;   Laterality: N/A;  ? TEE WITHOUT CARDIOVERSION N/A 02/26/2021  ? Procedure: TRANSESOPHAGEAL ECHOCARDIOGRAM (TEE);  Surgeon: Lajuana Matte, MD;  Location: [place];  Service: Open Heart Surgery;  Laterality: N/A;  ? TEE WITHOUT CARDIOVERSION N/A 06/10/2021  ? Procedure: TRANSESOPHAGEAL ECHOCARDIOGRAM (TEE);  Surgeon: Rex Kras, DO;  [facility];  Service: Cardiovascular;  Laterality: N/A;  ? UMBILICAL HERNIA REPAIR    ? ? ?Allergies  ?Allergen Reactions  ? Bee Venom Swelling  ? ? ?Social History  ? ?Socioeconomic History  ? Marital status: Widowed  ?  Spouse name: Not on file  ? Number of children: 2  ? Years of education: Not on file  ? Highest education level: Not on file  ?Occupational History  ? Not on file  ?Tobacco Use  ? Smoking status: Former  ?  Packs/day: 2.00  ?  Years: 30.00  ?  Pack years: 60.00  ?  Types: Cigarettes  ?  Quit date: 05/17/1989  ?  Years since quitting: 32.2  ? Smokeless tobacco: Never  ?Vaping Use  ? Vaping Use: Never used  ?Substance and Sexual Activity  ? Alcohol use: Not Currently  ? Drug use: No  ? Sexual activity: Not on file  ?Other Topics Concern  ? Not on file  ?Social History Narrative  ? Not on file  ? ?Social Determinants of Health  ? ?Financial Resource Strain: Not on file  ?Food Insecurity: Not on file  ?Transportation Needs: Not on file  ?Physical Activity: Not on file  ?Stress: Not on file  ?Social Connections: Not on file  ?Intimate Partner Violence: Not on file  ? ? ?Family History  ?Problem Relation Age of Onset  ? Diabetes Mother   ?     Died in a house fire  ? Lung cancer Father   ? Hypertension Sister   ? Hypertension Sister   ? Hypertension Brother   ? Diabetes Brother   ? Hypertension Brother   ? Hypertension Other   ? Diabetes Other   ? Rheum arthritis Other   ? Stroke Other   ? ? ? ? ?Review of Systems ? ?Ambulates with a cane for balance ? ?Constitutional: negative for anorexia, fevers and sweats  ?Eyes: negative for irritation, redness and visual  disturbance  ?Ears, nose, mouth, throat, and face: negative for earaches, epistaxis, nasal congestion and sore throat  ?Respiratory: negative for cough, dyspnea on exertion, sputum and wheezing  ?Cardiovascular: negative for chest pain, dyspnea, lower extremity edema, orthopnea, palpitations and syncope  ?Gastrointestinal: negative for abdominal pain, constipation, diarrhea, melena, nausea and vomiting  ?Genitourinary:negative for dysuria, frequency and hematuria  ?Hematologic/lymphatic: negative for bleeding, easy bruising and lymphadenopathy  ?Musculoskeletal:negative for arthralgias, muscle weakness and stiff joints  ?Neurological: negative for  headaches and weakness  ?Endocrine: negative for diabetic symptoms including polydipsia, polyuria and weight loss ? ?   ?Objective:  ? Physical Exam ? ?Gen. Pleasant, tall thin man , in no distress, normal affect ?ENT - no pallor,icterus, no post nasal drip ?  Neck: No JVD, no thyromegaly, no carotid bruits ?Lungs: no use of accessory muscles, no dullness to percussion, clear without rales or rhonchi  ?Cardiovascular: Rhythm regular, heart sounds  normal, no murmurs or gallops, no peripheral edema ?Abdomen: soft and non-tender, no hepatosplenomegaly, BS normal. ?Musculoskeletal: No deformities, no cyanosis or clubbing ?Neuro:  alert, non focal ?Skin -ecchymosis + ? ? ? ?   ?Assessment & Plan:  ? ?OSA -it is necessary to screen him for sleep disordered breathing given his hypersomnolence and prior history of OSA.  Cardiac comorbidities include congestive heart failure and paroxysmal atrial fibrillation.  However he has lost significant weight since his original study. ?Ideally he needs an attended polysomnogram due to his cardiac issues but with recently recovering from cardiac surgery, he prefers a home sleep test.  We will proceed with screening with a home sleep test and if any significant abnormalities proceed with an attended polysomnogram. ? ? The pathophysiology of  obstructive sleep apnea , it's cardiovascular consequences & modes of treatment including CPAP were discused with the patient in detail & they evidenced understanding. ? ? ?

## 2021-07-22 ENCOUNTER — Encounter (HOSPITAL_COMMUNITY)
Admission: RE | Admit: 2021-07-22 | Discharge: 2021-07-22 | Disposition: A | Discharge status: Home / Self Care | Payer: Medicare PPO | Source: Ambulatory Visit | Attending: Cardiology | Admitting: Cardiology

## 2021-07-22 DIAGNOSIS — Z951 Presence of aortocoronary bypass graft: Secondary | ICD-10-CM

## 2021-07-22 NOTE — Progress Notes (Signed)
Daily Session Note ? ?Patient Details  ?Name: Derek Bond ?MRN: [id number] ?Date of Birth: 15-Oct-1945 ?Referring Provider:   ?Flowsheet Row CARDIAC REHAB PHASE II ORIENTATION from 06/26/2021 in [place]  ?Referring Provider Dr. Kipp Brood  ? ?  ? ? ?Encounter Date: 07/22/2021 ? ?Check In: ? Session Check In - 07/22/21 1100   ? ?  ? Check-In  ? Supervising physician immediately available to respond to emergencies [facility] MD immediately available   ? Physician(s) Dr. Domenic Polite   ? Location [facility]   ? Staff Present Aundra Dubin, RN, BSN;Heather Otho Ket, BS, Exercise Physiologist;Kyndahl Jablon Kris Mouton, MS, ACSM-CEP, Exercise Physiologist   ? Virtual Visit No   ? Medication changes reported     No   ? Fall or balance concerns reported    Yes   ? Comments Patient says he loses his balance often and gets dizzy alot. He ambulates using a straight cane.   ? Tobacco Cessation No Change   ? Warm-up and Cool-down Performed as group-led instruction   ? Resistance Training Performed Yes   ? VAD Patient? No   ? PAD/SET Patient? No   ?  ? Pain Assessment  ? Currently in Pain? No/denies   ? Pain Score 0-No pain   ? Multiple Pain Sites No   ? ?  ?  ? ?  ? ? ?Capillary Blood Glucose: ?No results found for this or any previous visit (from the past 24 hour(s)). ? ? ? ?Social History  ? ?Tobacco Use  ?Smoking Status Former  ? Packs/day: 2.00  ? Years: 30.00  ? Pack years: 60.00  ? Types: Cigarettes  ? Quit date: 05/17/1989  ? Years since quitting: 32.2  ?Smokeless Tobacco Never  ? ? ?Goals Met:  ?Independence with exercise equipment ?Exercise tolerated well ?No report of concerns or symptoms today ?Strength training completed today ? ?Goals Unmet:  ?Not Applicable ? ?Comments: checkout time is 1200 ? ? ?Dr. Carlyle Dolly is Medical Director for [place] ?

## 2021-07-23 ENCOUNTER — Other Ambulatory Visit: Payer: Self-pay | Admitting: Cardiology

## 2021-07-23 DIAGNOSIS — I10 Essential (primary) hypertension: Secondary | ICD-10-CM | POA: Diagnosis not present

## 2021-07-23 DIAGNOSIS — Z6822 Body mass index (BMI) 22.0-22.9, adult: Secondary | ICD-10-CM | POA: Diagnosis not present

## 2021-07-23 DIAGNOSIS — I5022 Chronic systolic (congestive) heart failure: Secondary | ICD-10-CM | POA: Diagnosis not present

## 2021-07-23 DIAGNOSIS — E1165 Type 2 diabetes mellitus with hyperglycemia: Secondary | ICD-10-CM | POA: Diagnosis not present

## 2021-07-23 DIAGNOSIS — Z299 Encounter for prophylactic measures, unspecified: Secondary | ICD-10-CM | POA: Diagnosis not present

## 2021-07-24 ENCOUNTER — Encounter (HOSPITAL_COMMUNITY)
Admission: RE | Admit: 2021-07-24 | Discharge: 2021-07-24 | Disposition: A | Discharge status: Home / Self Care | Payer: Medicare PPO | Source: Ambulatory Visit | Attending: Cardiology | Admitting: Cardiology

## 2021-07-24 DIAGNOSIS — Z951 Presence of aortocoronary bypass graft: Secondary | ICD-10-CM

## 2021-07-24 NOTE — Progress Notes (Signed)
Daily Session Note ? ?Patient Details  ?Name: Derek Bond ?MRN: [id number] ?Date of Birth: 11/29/1945 ?Referring Provider:   ?Flowsheet Row CARDIAC REHAB PHASE II ORIENTATION from 06/26/2021 in [place]  ?Referring Provider Dr. Kipp Brood  ? ?  ? ? ?Encounter Date: 07/24/2021 ? ?Check In: ? Session Check In - 07/24/21 1100   ? ?  ? Check-In  ? Supervising physician immediately available to respond to emergencies [facility] MD immediately available   ? Physician(s) Dr. Domenic Polite   ? Location [facility]   ? Staff Present [name], MS, ACSM-CEP, Exercise Physiologist;[name] Otho Ket, BS, Exercise Physiologist;[name], RN, BSN   ? Virtual Visit No   ? Medication changes reported     No   ? Fall or balance concerns reported    Yes   ? Comments Patient says he loses his balance often and gets dizzy alot. He ambulates using a straight cane.   ? Tobacco Cessation No Change   ? Warm-up and Cool-down Performed as group-led instruction   ? Resistance Training Performed Yes   ? VAD Patient? No   ? PAD/SET Patient? No   ?  ? Pain Assessment  ? Currently in Pain? No/denies   ? Pain Score 0-No pain   ? Multiple Pain Sites No   ? ?  ?  ? ?  ? ? ?Capillary Blood Glucose: ?No results found for this or any previous visit (from the past 24 hour(s)). ? ? ? ?Social History  ? ?Tobacco Use  ?Smoking Status Former  ? Packs/day: 2.00  ? Years: 30.00  ? Pack years: 60.00  ? Types: Cigarettes  ? Quit date: 05/17/1989  ? Years since quitting: 32.2  ?Smokeless Tobacco Never  ? ? ?Goals Met:  ?Independence with exercise equipment ?Exercise tolerated well ?No report of concerns or symptoms today ?Strength training completed today ? ?Goals Unmet:  ?Not Applicable ? ?Comments: check out 1200 ? ? ?Dr. Carlyle Dolly is Medical Director for [place] ?

## 2021-07-27 ENCOUNTER — Encounter (HOSPITAL_COMMUNITY)
Admission: RE | Admit: 2021-07-27 | Discharge: 2021-07-27 | Disposition: A | Discharge status: Home / Self Care | Payer: Medicare PPO | Source: Ambulatory Visit | Attending: Cardiology | Admitting: Cardiology

## 2021-07-27 VITALS — Wt 181.4 lb

## 2021-07-27 DIAGNOSIS — Z951 Presence of aortocoronary bypass graft: Secondary | ICD-10-CM | POA: Diagnosis not present

## 2021-07-27 NOTE — Progress Notes (Signed)
Daily Session Note ? ?Patient Details  ?Name: Derek Bond ?MRN: [id number] ?Date of Birth: 07-28-1945 ?Referring Provider:   ?Flowsheet Row CARDIAC REHAB PHASE II ORIENTATION from 06/26/2021 in [place]  ?Referring Provider Dr. Kipp Brood  ? ?  ? ? ?Encounter Date: 07/27/2021 ? ?Check In: ? Session Check In - 07/27/21 1100   ? ?  ? Check-In  ? Supervising physician immediately available to respond to emergencies [facility] MD immediately available   ? Physician(s) Dr. Johney Frame   ? Location [facility]   ? Staff Present Geanie Cooley, RN;[place] Kris Mouton, MS, ACSM-CEP, Exercise Physiologist;[name], RN, BSN;Other   Daphyne Hassell Done RN  ? Virtual Visit No   ? Medication changes reported     No   ? Fall or balance concerns reported    Yes   ? Comments Patient says he loses his balance often and gets dizzy alot. He ambulates using a straight cane.   ? Tobacco Cessation No Change   ? Warm-up and Cool-down Performed as group-led instruction   ? Resistance Training Performed Yes   ? VAD Patient? No   ? PAD/SET Patient? No   ?  ? Pain Assessment  ? Currently in Pain? No/denies   ? Pain Score 0-No pain   ? Multiple Pain Sites No   ? ?  ?  ? ?  ? ? ?Capillary Blood Glucose: ?No results found for this or any previous visit (from the past 24 hour(s)). ? ? ? ?Social History  ? ?Tobacco Use  ?Smoking Status Former  ? Packs/day: 2.00  ? Years: 30.00  ? Pack years: 60.00  ? Types: Cigarettes  ? Quit date: 05/17/1989  ? Years since quitting: 32.2  ?Smokeless Tobacco Never  ? ? ?Goals Met:  ?Independence with exercise equipment ?Exercise tolerated well ?No report of concerns or symptoms today ?Strength training completed today ? ?Goals Unmet:  ?Not Applicable ? ?Comments: checkout time is 1200 ? ? ?Dr. Carlyle Dolly is Medical Director for [place] ?

## 2021-07-29 ENCOUNTER — Encounter (HOSPITAL_COMMUNITY)
Admission: RE | Admit: 2021-07-29 | Discharge: 2021-07-29 | Disposition: A | Discharge status: Home / Self Care | Payer: Medicare PPO | Source: Ambulatory Visit | Attending: Cardiology | Admitting: Cardiology

## 2021-07-29 ENCOUNTER — Other Ambulatory Visit: Payer: Self-pay | Admitting: Cardiology

## 2021-07-29 DIAGNOSIS — Z951 Presence of aortocoronary bypass graft: Secondary | ICD-10-CM

## 2021-07-29 NOTE — Progress Notes (Signed)
Daily Session Note ? ?Patient Details  ?Name: Derek Bond ?MRN: [id number] ?Date of Birth: 11/11/1945 ?Referring Provider:   ?Flowsheet Row CARDIAC REHAB PHASE II ORIENTATION from 06/26/2021 in [place]  ?Referring Provider Dr. Kipp Brood  ? ?  ? ? ?Encounter Date: 07/29/2021 ? ?Check In: ? Session Check In - 07/29/21 1100   ? ?  ? Check-In  ? Supervising physician immediately available to respond to emergencies [facility] MD immediately available   ? Physician(s) Dr Harl Bowie   ? Location [facility]   ? Staff Present [name], MS, ACSM-CEP, Exercise Physiologist;Heather Otho Ket, BS, Exercise Physiologist;[name], RN, Joanette Gula, RN, BSN   ? Virtual Visit No   ? Medication changes reported     No   ? Fall or balance concerns reported    Yes   ? Comments Patient says he loses his balance often and gets dizzy alot. He ambulates using a straight cane.   ? Tobacco Cessation No Change   ? Warm-up and Cool-down Performed as group-led instruction   ? Resistance Training Performed Yes   ? VAD Patient? No   ? PAD/SET Patient? No   ?  ? Pain Assessment  ? Currently in Pain? No/denies   ? Pain Score 0-No pain   ? Multiple Pain Sites No   ? ?  ?  ? ?  ? ? ?Capillary Blood Glucose: ?No results found for this or any previous visit (from the past 24 hour(s)). ? ? ? ?Social History  ? ?Tobacco Use  ?Smoking Status Former  ? Packs/day: 2.00  ? Years: 30.00  ? Pack years: 60.00  ? Types: Cigarettes  ? Quit date: 05/17/1989  ? Years since quitting: 32.2  ?Smokeless Tobacco Never  ? ? ?Goals Met:  ?Independence with exercise equipment ?Exercise tolerated well ?No report of concerns or symptoms today ?Strength training completed today ? ?Goals Unmet:  ?Not Applicable ? ?Comments: Check out 1200 ? ? ?Dr. Carlyle Dolly is Medical Director for [place] ?

## 2021-07-31 ENCOUNTER — Encounter (HOSPITAL_COMMUNITY)
Admission: RE | Admit: 2021-07-31 | Discharge: 2021-07-31 | Disposition: A | Discharge status: Home / Self Care | Payer: Medicare PPO | Source: Ambulatory Visit | Attending: Cardiology | Admitting: Cardiology

## 2021-07-31 DIAGNOSIS — Z951 Presence of aortocoronary bypass graft: Secondary | ICD-10-CM

## 2021-07-31 NOTE — Progress Notes (Signed)
Daily Session Note ? ?Patient Details  ?Name: Derek Bond ?MRN: [id number] ?Date of Birth: March 13, 1946 ?Referring Provider:   ?Flowsheet Row CARDIAC REHAB PHASE II ORIENTATION from 06/26/2021 in [place]  ?Referring Provider Dr. Kipp Brood  ? ?  ? ? ?Encounter Date: 07/31/2021 ? ?Check In: ? Session Check In - 07/31/21 1107   ? ?  ? Check-In  ? Supervising physician immediately available to respond to emergencies [facility] MD immediately available   ? Physician(s) Dr Harl Bowie   ? Location [facility]   ? Staff Present Geanie Cooley, RN;[name], RN, Joanette Gula, RN, Madlyn Frankel, RN, BSN   ? Virtual Visit No   ? Medication changes reported     No   ? Fall or balance concerns reported    Yes   ? Comments Patient says he loses his balance often and gets dizzy alot. He ambulates using a straight cane.   ? Tobacco Cessation No Change   ? Warm-up and Cool-down Performed as group-led instruction   ? Resistance Training Performed Yes   ? VAD Patient? No   ? PAD/SET Patient? No   ?  ? PAD/SET Patient  ? Completed foot check today? No   ?  ? Pain Assessment  ? Currently in Pain? No/denies   ? Pain Score 0-No pain   ? ?  ?  ? ?  ? ? ?Capillary Blood Glucose: ?No results found for this or any previous visit (from the past 24 hour(s)). ? ? ? ?Social History  ? ?Tobacco Use  ?Smoking Status Former  ? Packs/day: 2.00  ? Years: 30.00  ? Pack years: 60.00  ? Types: Cigarettes  ? Quit date: 05/17/1989  ? Years since quitting: 32.2  ?Smokeless Tobacco Never  ? ? ?Goals Met:  ?Independence with exercise equipment ?Exercise tolerated well ?No report of concerns or symptoms today ?Strength training completed today ? ?Goals Unmet:  ?Not Applicable ? ?Comments: checkout 1200 ? ? ? ?Dr. Carlyle Dolly is Medical Director for [place] ?

## 2021-08-03 ENCOUNTER — Encounter (HOSPITAL_COMMUNITY)
Admission: RE | Admit: 2021-08-03 | Discharge: 2021-08-03 | Disposition: A | Discharge status: Home / Self Care | Payer: Medicare PPO | Source: Ambulatory Visit | Attending: Cardiology | Admitting: Cardiology

## 2021-08-03 DIAGNOSIS — Z951 Presence of aortocoronary bypass graft: Secondary | ICD-10-CM

## 2021-08-03 NOTE — Progress Notes (Signed)
Daily Session Note ? ?Patient Details  ?Name: Derek Bond ?MRN: [id number] ?Date of Birth: [birth year] ?Referring Provider:   ?Flowsheet Row CARDIAC REHAB PHASE II ORIENTATION from 06/26/2021 in [place]  ?Referring Provider Dr. Kipp Brood  ? ?  ? ? ?Encounter Date: 08/03/2021 ? ?Check In: ? Session Check In - 08/03/21 1100   ? ?  ? Check-In  ? Supervising physician immediately available to respond to emergencies [facility] MD immediately available   ? Physician(s) Dr Domenic Polite   ? Location [facility]   ? Staff Present Redge Gainer, BS, Exercise Physiologist;[name], RN, Joanette Gula, RN, Madlyn Frankel, RN, BSN   ? Virtual Visit No   ? Medication changes reported     No   ? Fall or balance concerns reported    Yes   ? Comments Patient says he loses his balance often and gets dizzy alot. He ambulates using a straight cane.   ? Tobacco Cessation No Change   ? Warm-up and Cool-down Performed as group-led instruction   ? Resistance Training Performed Yes   ? VAD Patient? No   ? PAD/SET Patient? No   ?  ? PAD/SET Patient  ? Completed foot check today? No   ?  ? Pain Assessment  ? Currently in Pain? No/denies   ? Pain Score 0-No pain   ? ?  ?  ? ?  ? ? ?Capillary Blood Glucose: ?No results found for this or any previous visit (from the past 24 hour(s)). ? ? ? ?Social History  ? ?Tobacco Use  ?Smoking Status Former  ? Packs/day: 2.00  ? Years: 30.00  ? Pack years: 60.00  ? Types: Cigarettes  ? Quit date: 05/17/1989  ? Years since quitting: 32.2  ?Smokeless Tobacco Never  ? ? ?Goals Met:  ?Independence with exercise equipment ?Exercise tolerated well ?No report of concerns or symptoms today ?Strength training completed today ? ?Goals Unmet:  ?Not Applicable ? ?Comments: checkout 1200 ? ? ? ?Dr. Carlyle Dolly is Medical Director for [place] ?

## 2021-08-05 ENCOUNTER — Encounter (HOSPITAL_COMMUNITY)
Admission: RE | Admit: 2021-08-05 | Discharge: 2021-08-05 | Disposition: A | Discharge status: Home / Self Care | Payer: Medicare PPO | Source: Ambulatory Visit | Attending: Cardiology | Admitting: Cardiology

## 2021-08-05 DIAGNOSIS — Z951 Presence of aortocoronary bypass graft: Secondary | ICD-10-CM | POA: Diagnosis not present

## 2021-08-05 NOTE — Progress Notes (Signed)
Daily Session Note ? ?Patient Details  ?Name: Derek Bond ?MRN: [id number] ?Date of Birth: December 08, 1945 ?Referring Provider:   ?Flowsheet Row CARDIAC REHAB PHASE II ORIENTATION from 06/26/2021 in [place]  ?Referring Provider Dr. Kipp Brood  ? ?  ? ? ?Encounter Date: 08/05/2021 ? ?Check In: ? ? ?Capillary Blood Glucose: ?No results found for this or any previous visit (from the past 24 hour(s)). ? ? ? ?Social History  ? ?Tobacco Use  ?Smoking Status Former  ? Packs/day: 2.00  ? Years: 30.00  ? Pack years: 60.00  ? Types: Cigarettes  ? Quit date: 05/17/1989  ? Years since quitting: 32.2  ?Smokeless Tobacco Never  ? ? ?Goals Met:  ?Independence with exercise equipment ?Exercise tolerated well ?No report of concerns or symptoms today ?Strength training completed today ? ?Goals Unmet:  ?Not Applicable ? ?Comments: check out @ 12:00pm ? ? ?Dr. Carlyle Dolly is Medical Director for [place] ?

## 2021-08-06 ENCOUNTER — Other Ambulatory Visit: Payer: Self-pay | Admitting: Cardiology

## 2021-08-06 DIAGNOSIS — I5022 Chronic systolic (congestive) heart failure: Secondary | ICD-10-CM

## 2021-08-07 ENCOUNTER — Encounter (HOSPITAL_COMMUNITY)
Admission: RE | Admit: 2021-08-07 | Discharge: 2021-08-07 | Disposition: A | Discharge status: Home / Self Care | Payer: Medicare PPO | Source: Ambulatory Visit | Attending: Cardiology | Admitting: Cardiology

## 2021-08-07 DIAGNOSIS — Z951 Presence of aortocoronary bypass graft: Secondary | ICD-10-CM

## 2021-08-07 NOTE — Progress Notes (Signed)
Daily Session Note ? ?Patient Details  ?Name: Derek Bond ?MRN: [id number] ?Date of Birth: 07/02/1945 ?Referring Provider:   ?Flowsheet Row CARDIAC REHAB PHASE II ORIENTATION from 06/26/2021 in [place]  ?Referring Provider Dr. Kipp Brood  ? ?  ? ? ?Encounter Date: 08/07/2021 ? ?Check In: ? Session Check In - 08/07/21 1100   ? ?  ? Check-In  ? Supervising physician immediately available to respond to emergencies [facility] MD immediately available   ? Physician(s) Dr Gardiner Rhyme   ? Location [facility]   ? Staff Present Redge Gainer, BS, Exercise Physiologist;Christy Oletta Lamas, RN, Joanette Gula, RN, BSN   ? Virtual Visit No   ? Medication changes reported     No   ? Fall or balance concerns reported    Yes   ? Comments Patient says he loses his balance often and gets dizzy alot. He ambulates using a straight cane.   ? Tobacco Cessation No Change   ? Warm-up and Cool-down Performed as group-led instruction   ? Resistance Training Performed Yes   ? VAD Patient? No   ? PAD/SET Patient? No   ?  ? PAD/SET Patient  ? Completed foot check today? No   ? Open wounds to report? No   ?  ? Pain Assessment  ? Currently in Pain? No/denies   ? Pain Score 0-No pain   ? Multiple Pain Sites No   ? ?  ?  ? ?  ? ? ?Capillary Blood Glucose: ?No results found for this or any previous visit (from the past 24 hour(s)). ? ? ? ?Social History  ? ?Tobacco Use  ?Smoking Status Former  ? Packs/day: 2.00  ? Years: 30.00  ? Pack years: 60.00  ? Types: Cigarettes  ? Quit date: 05/17/1989  ? Years since quitting: 32.2  ?Smokeless Tobacco Never  ? ? ?Goals Met:  ?Independence with exercise equipment ?Exercise tolerated well ?No report of concerns or symptoms today ?Strength training completed today ? ?Goals Unmet:  ?Not Applicable ? ?Comments: Checkout at 1200. ? ? ?Dr. Carlyle Dolly is Medical Director for [place] ?

## 2021-08-10 ENCOUNTER — Other Ambulatory Visit: Payer: Self-pay | Admitting: Cardiology

## 2021-08-10 ENCOUNTER — Encounter (HOSPITAL_COMMUNITY)
Admission: RE | Admit: 2021-08-10 | Discharge: 2021-08-10 | Disposition: A | Discharge status: Home / Self Care | Payer: Medicare PPO | Source: Ambulatory Visit | Attending: Cardiology | Admitting: Cardiology

## 2021-08-10 VITALS — Wt 187.4 lb

## 2021-08-10 DIAGNOSIS — Z951 Presence of aortocoronary bypass graft: Secondary | ICD-10-CM

## 2021-08-10 DIAGNOSIS — I4891 Unspecified atrial fibrillation: Secondary | ICD-10-CM

## 2021-08-10 MED ORDER — AMIODARONE HCL 200 MG PO TABS: 100.0000 mg | ORAL_TABLET | Freq: Every day | ORAL | 0 refills | Status: DC

## 2021-08-10 MED ORDER — APIXABAN 5 MG PO TABS: 5.0000 mg | ORAL_TABLET | Freq: Two times a day (BID) | ORAL | 0 refills | Status: DC

## 2021-08-10 NOTE — Progress Notes (Signed)
Daily Session Note ? ?Patient Details  ?Name: Anant Agard Fantini ?MRN: [id number] ?Date of Birth: 1945/09/20 ?Referring Provider:   ?Flowsheet Row CARDIAC REHAB PHASE II ORIENTATION from 06/26/2021 in [place]  ?Referring Provider Dr. Kipp Brood  ? ?  ? ? ?Encounter Date: 08/10/2021 ? ?Check In: ? Session Check In - 08/10/21 1100   ? ?  ? Check-In  ? Supervising physician immediately available to respond to emergencies [facility] MD immediately available   ? Physician(s) Dr Marlou Porch   ? Location [facility]   ? Staff Present Madelyn Flavors, RN, Bjorn Loser, MS, ACSM-CEP, Exercise Physiologist;Heather Zigmund Daniel, Exercise Physiologist   ? Virtual Visit No   ? Medication changes reported     No   ? Fall or balance concerns reported    Yes   ? Comments Patient says he loses his balance often and gets dizzy alot. He ambulates using a straight cane.   ? Tobacco Cessation No Change   ? Warm-up and Cool-down Performed as group-led instruction   ? Resistance Training Performed Yes   ? VAD Patient? No   ? PAD/SET Patient? No   ?  ? PAD/SET Patient  ? Completed foot check today? No   ? Open wounds to report? No   ?  ? Pain Assessment  ? Currently in Pain? No/denies   ? Pain Score 0-No pain   ? Multiple Pain Sites No   ? ?  ?  ? ?  ? ? ?Capillary Blood Glucose: ?No results found for this or any previous visit (from the past 24 hour(s)). ? ? ? ?Social History  ? ?Tobacco Use  ?Smoking Status Former  ? Packs/day: 2.00  ? Years: 30.00  ? Pack years: 60.00  ? Types: Cigarettes  ? Quit date: 05/17/1989  ? Years since quitting: 32.2  ?Smokeless Tobacco Never  ? ? ?Goals Met:  ?Independence with exercise equipment ?Exercise tolerated well ?No report of concerns or symptoms today ?Strength training completed today ? ?Goals Unmet:  ?Not Applicable ? ?Comments: Check out at 1200. ? ? ?Dr. Carlyle Dolly is Medical Director for [place] ?

## 2021-08-12 ENCOUNTER — Encounter (HOSPITAL_COMMUNITY)
Admission: RE | Admit: 2021-08-12 | Discharge: 2021-08-12 | Disposition: A | Discharge status: Home / Self Care | Payer: Medicare PPO | Source: Ambulatory Visit | Attending: Cardiology | Admitting: Cardiology

## 2021-08-12 DIAGNOSIS — Z951 Presence of aortocoronary bypass graft: Secondary | ICD-10-CM | POA: Diagnosis not present

## 2021-08-12 NOTE — Progress Notes (Signed)
Cardiac Individual Treatment Plan ? ?Patient Details  ?Name: Derek Bond ?MRN: [id number] ?Date of Birth: 02-Jul-1945 ?Referring Provider:   ?Flowsheet Row CARDIAC REHAB PHASE II ORIENTATION from 06/26/2021 in [place]  ?Referring Provider Dr. Kipp Brood  ? ?  ? ? ?Initial Encounter Date:  ?Flowsheet Row CARDIAC REHAB PHASE II ORIENTATION from 06/26/2021 in [place]  ?Date 06/26/21  ? ?  ? ? ?Visit Diagnosis: S/P CABG x 4 ? ?Patient's Home Medications on Admission: ? ?Current Outpatient Medications:  ?  acetaminophen (TYLENOL) 500 MG tablet, Take 1,000 mg by mouth every 6 (six) hours as needed for moderate pain or mild pain., Disp: , Rfl:  ?  ALPRAZolam (XANAX) 0.5 MG tablet, Take 0.5 mg by mouth at bedtime as needed for sleep., Disp: , Rfl:  ?  amiodarone (PACERONE) 200 MG tablet, Take 0.5 tablets (100 mg total) by mouth daily., Disp: 30 tablet, Rfl: 0 ?  apixaban (ELIQUIS) 5 MG TABS tablet, Take 1 tablet (5 mg total) by mouth 2 (two) times daily., Disp: 180 tablet, Rfl: 0 ?  aspirin EC 81 MG tablet, Take 81 mg by mouth daily., Disp: , Rfl:  ?  benzonatate (TESSALON) 200 MG capsule, Take 200 mg by mouth 3 (three) times daily as needed for cough., Disp: , Rfl:  ?  dapagliflozin propanediol (FARXIGA) 10 MG TABS tablet, Take 1 tablet (10 mg total) by mouth daily., Disp: 90 tablet, Rfl: 0 ?  famotidine (PEPCID) 40 MG tablet, Take 40 mg by mouth at bedtime., Disp: , Rfl:  ?  feeding supplement (ENSURE ENLIVE / ENSURE PLUS) LIQD, Take 237 mLs by mouth 2 (two) times daily between meals. Until oral intake improves, Disp: 237 mL, Rfl: 12 ?  furosemide (LASIX) 40 MG tablet, TAKE 1 TABLET BY MOUTH EVERY DAY, Disp: 30 tablet, Rfl: 0 ?  glipiZIDE (GLUCOTROL XL) 10 MG 24 hr tablet, Take 10 mg by mouth 2 (two) times daily., Disp: , Rfl:  ?  KLOR-CON M10 10 MEQ tablet, TAKE 1 TABLET BY MOUTH EVERY DAY, Disp: 30 tablet, Rfl: 0 ?  Magnesium Oxide 400 MG CAPS, Take 1 capsule (400 mg  total) by mouth daily., Disp: 90 capsule, Rfl: 0 ?  metFORMIN (GLUCOPHAGE) 1000 MG tablet, Take 1,000 mg by mouth daily with breakfast., Disp: , Rfl:  ?  metoprolol succinate (TOPROL-XL) 25 MG 24 hr tablet, TAKE 1 TABLET (25 MG TOTAL) BY MOUTH DAILY., Disp: 90 tablet, Rfl: 0 ?  pantoprazole (PROTONIX) 40 MG tablet, Take 40 mg by mouth every evening., Disp: , Rfl:  ?  rosuvastatin (CRESTOR) 5 MG tablet, Take 5 mg by mouth at bedtime., Disp: , Rfl:  ?  sacubitril-valsartan (ENTRESTO) 97-103 MG, Take 1 tablet by mouth 2 (two) times daily., Disp: 180 tablet, Rfl: 1 ?  traMADol (ULTRAM) 50 MG tablet, Take by mouth every 6 (six) hours as needed., Disp: , Rfl:  ? ?Past Medical History: ?Past Medical History:  ?Diagnosis Date  ? Atrial fibrillation ([place])   ? CAD (coronary artery disease)   ? Mild nonobstructive disease at cardiac catheterization 2009  ? Cancer [facility])   ? Melanoma  ? Essential hypertension   ? 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 Hiatal hernia   ? History of melanoma   ? Obstructive sleep apnea   ? Type 2 diabetes mellitus ([place])   ? ? ?Tobacco Use: ?Social History  ? ?Tobacco Use  ?  Smoking Status Former  ? Packs/day: 2.00  ? Years: 30.00  ? Pack years: 60.00  ? Types: Cigarettes  ? Quit date: 05/17/1989  ? Years since quitting: 32.2  ?Smokeless Tobacco Never  ? ? ?Labs: ?Review Flowsheet   ? ?  ?  Latest Ref Rng & Units 08/01/2007 02/22/2021 02/23/2021 02/26/2021  ?Labs for ITP Cardiac and Pulmonary Rehab  ?Cholestrol 0 - 200 mg/dL 173        ?ATP III CLASSIFICATION: ? <200     mg/dL   Desirable ? 200-239  mg/dL   Borderline High ? >=240    mg/dL   High   99      ?LDL (calc) 0 - 99 mg/dL 117        ?Total Cholesterol/HDL:CHD Risk ?Coronary Heart Disease Risk Table ?                    Men   Women ? 1/2 Average Risk   3.4   3.3   44      ?HDL-C >40 mg/dL 30   44      ?Trlycerides <150 mg/dL 129   56      ?Hemoglobin A1c 4.8 - 5.6 %  8.1      ?PH, Arterial 7.350 - 7.450   7.372   7.339     ? 7.425    ? 7.436    ? 7.436    ? 7.441    ? 7.464    ? 7.457    ? 7.400    ? 7.321    ?PCO2 arterial 32.0 - 48.0 mmHg   42.4   33.9    ? 37.5    ? 39.7    ? 38.9    ? 40.3    ? 37.3    ? 38.7    ? 43.1    ? 47.9    ?Bicarbonate 20.0 - 28.0 mmol/L   24.6    ? 25.7   18.2    ? 24.6    ? 26.7    ? 26.2    ? 27.4    ? 26.7    ? 27.3    ? 26.0    ? 26.7    ? 24.7    ?TCO2 22 - 32 mmol/L   26    ? 27   19    ? 26    ? 27    ? 28    ? 27    ? 25    ? 29    ? 26    ? 28    ? 28    ? 27    ? 28    ? 28    ? 26    ? 26    ?Acid-base deficit 0.0 - 2.0 mmol/L   1.0    ? 1.0   7.0    ? 2.0    ?O2 Saturation %   99.0    ? 75.0   99.0    ? 99.0    ? 100.0    ? 99.0    ? 100.0    ? 100.0    ? 100.0    ? 88.0    ? 100.0    ? 99.0    ? ?  06/10/2021  ?Labs for ITP Cardiac and Pulmonary Rehab  ?Cholestrol   ?LDL (calc)   ?HDL-C   ?Trlycerides   ?Hemoglobin A1c   ?PH, Arterial   ?  PCO2 arterial   ?Bicarbonate   ?TCO2 25    ?Acid-base deficit   ?O2 Saturation   ?  ? ? Multiple values from one day are sorted in reverse-chronological order  ?  ?  ? ? ?Capillary Blood Glucose: ?Lab Results  ?Component Value Date  ? GLUCAP 241 (H) 06/26/2021  ? GLUCAP 125 (H) 06/10/2021  ? GLUCAP 148 (H) 04/14/2021  ? GLUCAP 131 (H) 04/14/2021  ? GLUCAP 167 (H) 04/02/2021  ? ? ? ?Exercise Target Goals: ?Exercise Program Goal: ?Individual exercise prescription set using results from initial 6 min walk test and THRR while considering  patient?s activity barriers and safety.  ? ?Exercise Prescription Goal: ?Starting with aerobic activity 30 plus minutes a day, 3 days per week for initial exercise prescription. Provide home exercise prescription and guidelines that participant acknowledges understanding prior to discharge. ? ?Activity Barriers & Risk Stratification: ? Activity Barriers & Cardiac Risk Stratification - 06/26/21 1250   ? ?  ? Activity Barriers & Cardiac Risk Stratification  ? Activity Barriers Left Knee Replacement;Deconditioning;Shortness of  Breath;Balance Concerns;Assistive Device;Chest Pain/Angina   ? Cardiac Risk Stratification High   ? ?  ?  ? ?  ? ? ?6 Minute Walk: ? 6 Minute Walk   ? ? [place] Name 06/26/21 1407  ?  ?  ?  ? 6 Minute Walk  ? Phase Initial    ? Distance 800 feet    ? Walk Time 6 minutes    ? MPH 1.5    ? METS 2.16    ? RPE 11    ? VO2 Peak 7.56    ? Symptoms No    ? Resting HR 72 bpm    ? Resting BP 100/50    ? Resting Oxygen Saturation  94 %    ? Exercise Oxygen Saturation  during 6 min walk 95 %    ? Max Ex. HR 85 bpm    ? Max Ex. BP 115/58    ? 2 Minute Post BP 102/58    ? ?  ?  ? ?  ? ? ?Oxygen Initial Assessment: ? ? ?Oxygen Re-Evaluation: ? ? ?Oxygen Discharge (Final Oxygen Re-Evaluation): ? ? ?Initial Exercise Prescription: ? Initial Exercise Prescription - 06/26/21 1400   ? ?  ? Date of Initial Exercise RX and Referring Provider  ? Date 06/26/21   ? Referring Provider Dr. Kipp Brood   ? Expected Discharge Date 09/11/21   ?  ? NuStep  ? Level 1   ? SPM 60   ? Minutes 22   ?  ? Arm Ergometer  ? Level 1   ? RPM 50   ? Minutes 17   ?  ? Prescription Details  ? Frequency (times per week) 3   ? Duration Progress to 30 minutes of continuous aerobic without signs/symptoms of physical distress   ?  ? Intensity  ? THRR 40-80% of Max Heartrate 58-116   ? Ratings of Perceived Exertion 11-13   ? Perceived Dyspnea 0-4   ?  ? Resistance Training  ? Training Prescription Yes   ? Weight 2   ? Reps 10-15   ? ?  ?  ? ?  ? ? ?Perform Capillary Blood Glucose checks as needed. ? ?Exercise Prescription Changes: ? ? Exercise Prescription Changes   ? ? [place] Name 06/29/21 1400 07/06/21 1100 07/13/21 1200 07/28/21 1200 08/10/21 1500  ?  ? Response to Exercise  ? Blood Pressure (Admit) 128/70 -- 116/68 112/60  126/74  ? Blood Pressure (Exercise) 125/70 -- 124/64 124/76 132/64  ? Blood Pressure (Exit) 120/60 -- 110/62 112/68 124/76  ? Heart Rate (Admit) 84 bpm -- 84 bpm 89 bpm 85 bpm  ? Heart Rate (Exercise) 89 bpm -- 102 bpm 108 bpm 108 bpm  ? Heart Rate  (Exit) 81 bpm -- 93 bpm 98 bpm 94 bpm  ? Rating of Perceived Exertion (Exercise) 12 -- '13 12 11  '$ ? Duration Continue with 30 min of aerobic exercise without signs/symptoms of physical distress. -- Continue w

## 2021-08-12 NOTE — Progress Notes (Signed)
Daily Session Note ? ?Patient Details  ?Name: Derek Bond ?MRN: [id number] ?Date of Birth: March 06, 1946 ?Referring Provider:   ?Flowsheet Row CARDIAC REHAB PHASE II ORIENTATION from 06/26/2021 in [place]  ?Referring Provider Dr. Kipp Brood  ? ?  ? ? ?Encounter Date: 08/12/2021 ? ?Check In: ? Session Check In - 08/12/21 1100   ? ?  ? Check-In  ? Supervising physician immediately available to respond to emergencies [facility] MD immediately available   ? Physician(s) Dr Harl Bowie   ? Location [facility]   ? Staff Present Redge Gainer, BS, Exercise Physiologist;Montine Hight Hassell Done, RN, Bjorn Loser, MS, ACSM-CEP, Exercise Physiologist;Danny Russella Dar, RN, BSN   ? Virtual Visit No   ? Medication changes reported     No   ? Fall or balance concerns reported    Yes   ? Comments Patient says he loses his balance often and gets dizzy alot. He ambulates using a straight cane.   ? Tobacco Cessation No Change   ? Warm-up and Cool-down Performed as group-led instruction   ? Resistance Training Performed Yes   ? VAD Patient? No   ? PAD/SET Patient? No   ?  ? PAD/SET Patient  ? Completed foot check today? No   ? Open wounds to report? No   ?  ? Pain Assessment  ? Currently in Pain? No/denies   ? Pain Score 0-No pain   ? Multiple Pain Sites No   ? ?  ?  ? ?  ? ? ?Capillary Blood Glucose: ?No results found for this or any previous visit (from the past 24 hour(s)). ? ? ? ?Social History  ? ?Tobacco Use  ?Smoking Status Former  ? Packs/day: 2.00  ? Years: 30.00  ? Pack years: 60.00  ? Types: Cigarettes  ? Quit date: 05/17/1989  ? Years since quitting: 32.2  ?Smokeless Tobacco Never  ? ? ?Goals Met:  ?Independence with exercise equipment ?Exercise tolerated well ?No report of concerns or symptoms today ?Strength training completed today ? ?Goals Unmet:  ?Not Applicable ? ?Comments: Check out at 1200. ? ? ?Dr. Carlyle Dolly is Medical Director for [place] ?

## 2021-08-13 DIAGNOSIS — E1165 Type 2 diabetes mellitus with hyperglycemia: Secondary | ICD-10-CM | POA: Diagnosis not present

## 2021-08-13 DIAGNOSIS — I10 Essential (primary) hypertension: Secondary | ICD-10-CM | POA: Diagnosis not present

## 2021-08-14 ENCOUNTER — Encounter (HOSPITAL_COMMUNITY)
Admission: RE | Admit: 2021-08-14 | Discharge: 2021-08-14 | Disposition: A | Discharge status: Home / Self Care | Payer: Medicare PPO | Source: Ambulatory Visit | Attending: Cardiology | Admitting: Cardiology

## 2021-08-14 DIAGNOSIS — Z951 Presence of aortocoronary bypass graft: Secondary | ICD-10-CM | POA: Diagnosis not present

## 2021-08-14 NOTE — Progress Notes (Signed)
Daily Session Note ? ?Patient Details  ?Name: Derek Bond ?MRN: [id number] ?Date of Birth: 05-10-1946 ?Referring Provider:   ?Flowsheet Row CARDIAC REHAB PHASE II ORIENTATION from 06/26/2021 in [place]  ?Referring Provider Dr. Kipp Brood  ? ?  ? ? ?Encounter Date: 08/14/2021 ? ?Check In: ? Session Check In - 08/14/21 1100   ? ?  ? Check-In  ? Supervising physician immediately available to respond to emergencies [facility] MD immediately available   ? Physician(s) Dr Harl Bowie   ? Location [facility]   ? Staff Present Aundra Dubin, RN, Joanette Gula, RN, BSN;Christy Edwards, RN, Bjorn Loser, MS, ACSM-CEP, Exercise Physiologist   ? Virtual Visit No   ? Medication changes reported     No   ? Fall or balance concerns reported    Yes   ? Comments Patient says he loses his balance often and gets dizzy alot. He ambulates using a straight cane.   ? Tobacco Cessation No Change   ? Warm-up and Cool-down Performed as group-led instruction   ? Resistance Training Performed Yes   ? VAD Patient? No   ? PAD/SET Patient? No   ?  ? PAD/SET Patient  ? Completed foot check today? No   ? Open wounds to report? No   ?  ? Pain Assessment  ? Currently in Pain? No/denies   ? Pain Score 0-No pain   ? Multiple Pain Sites No   ? ?  ?  ? ?  ? ? ?Capillary Blood Glucose: ?No results found for this or any previous visit (from the past 24 hour(s)). ? ? ? ?Social History  ? ?Tobacco Use  ?Smoking Status Former  ? Packs/day: 2.00  ? Years: 30.00  ? Pack years: 60.00  ? Types: Cigarettes  ? Quit date: 05/17/1989  ? Years since quitting: 32.2  ?Smokeless Tobacco Never  ? ? ?Goals Met:  ?Independence with exercise equipment ?Exercise tolerated well ?No report of concerns or symptoms today ?Strength training completed today ? ?Goals Unmet:  ?Not Applicable ? ?Comments: Checkout 1200. ? ? ?Dr. Carlyle Dolly is Medical Director for [facility] ?

## 2021-08-16 ENCOUNTER — Other Ambulatory Visit: Payer: Self-pay | Admitting: Cardiology

## 2021-08-17 ENCOUNTER — Encounter (HOSPITAL_COMMUNITY)
Admission: RE | Admit: 2021-08-17 | Discharge: 2021-08-17 | Disposition: A | Discharge status: Home / Self Care | Payer: Medicare PPO | Source: Ambulatory Visit | Attending: Cardiology | Admitting: Cardiology

## 2021-08-17 DIAGNOSIS — Z951 Presence of aortocoronary bypass graft: Secondary | ICD-10-CM | POA: Insufficient documentation

## 2021-08-17 NOTE — Progress Notes (Signed)
Daily Session Note ? ?Patient Details  ?Name: Derek Bond ?MRN: [id number] ?Date of Birth: 10/24/1945 ?Referring Provider:   ?Flowsheet Row CARDIAC REHAB PHASE II ORIENTATION from 06/26/2021 in [place]  ?Referring Provider Dr. Kipp Brood  ? ?  ? ? ?Encounter Date: 08/17/2021 ? ?Check In: ? Session Check In - 08/17/21 1100   ? ?  ? Check-In  ? Supervising physician immediately available to respond to emergencies [facility] MD immediately available   ? Physician(s) Dr Harl Bowie   ? Location [facility]   ? Virtual Visit No   ? Medication changes reported     No   ? Fall or balance concerns reported    Yes   ? Comments Patient says he loses his balance often and gets dizzy alot. He ambulates using a straight cane.   ? Tobacco Cessation No Change   ? Warm-up and Cool-down Performed as group-led instruction   ? Resistance Training Performed Yes   ? VAD Patient? No   ? PAD/SET Patient? No   ?  ? PAD/SET Patient  ? Completed foot check today? No   ? Open wounds to report? No   ?  ? Pain Assessment  ? Currently in Pain? No/denies   ? Pain Score 0-No pain   ? Multiple Pain Sites No   ? ?  ?  ? ?  ? ? ?Capillary Blood Glucose: ?No results found for this or any previous visit (from the past 24 hour(s)). ? ? ? ?Social History  ? ?Tobacco Use  ?Smoking Status Former  ? Packs/day: 2.00  ? Years: 30.00  ? Pack years: 60.00  ? Types: Cigarettes  ? Quit date: 05/17/1989  ? Years since quitting: 32.2  ?Smokeless Tobacco Never  ? ? ?Goals Met:  ?Independence with exercise equipment ?Exercise tolerated well ?No report of concerns or symptoms today ?Strength training completed today ? ?Goals Unmet:  ?Not Applicable ? ?Comments: Checkout 1200. ? ? ?Dr. Carlyle Dolly is Medical Director for [place] ?

## 2021-08-18 DIAGNOSIS — E1129 Type 2 diabetes mellitus with other diabetic kidney complication: Secondary | ICD-10-CM | POA: Diagnosis not present

## 2021-08-18 DIAGNOSIS — I10 Essential (primary) hypertension: Secondary | ICD-10-CM | POA: Diagnosis not present

## 2021-08-18 DIAGNOSIS — R809 Proteinuria, unspecified: Secondary | ICD-10-CM | POA: Diagnosis not present

## 2021-08-18 DIAGNOSIS — R350 Frequency of micturition: Secondary | ICD-10-CM | POA: Diagnosis not present

## 2021-08-18 DIAGNOSIS — Z299 Encounter for prophylactic measures, unspecified: Secondary | ICD-10-CM | POA: Diagnosis not present

## 2021-08-19 ENCOUNTER — Encounter (HOSPITAL_COMMUNITY)
Admission: RE | Admit: 2021-08-19 | Discharge: 2021-08-19 | Disposition: A | Discharge status: Home / Self Care | Payer: Medicare PPO | Source: Ambulatory Visit | Attending: Cardiology | Admitting: Cardiology

## 2021-08-19 DIAGNOSIS — Z951 Presence of aortocoronary bypass graft: Secondary | ICD-10-CM | POA: Diagnosis not present

## 2021-08-19 NOTE — Progress Notes (Signed)
Daily Session Note ? ?Patient Details  ?Name: Derek Bond ?MRN: [id number] ?Date of Birth: July 29, 1945 ?Referring Provider:   ?Flowsheet Row CARDIAC REHAB PHASE II ORIENTATION from 06/26/2021 in [place]  ?Referring Provider Dr. Kipp Brood  ? ?  ? ? ?Encounter Date: 08/19/2021 ? ?Check In: ? Session Check In - 08/19/21 1100   ? ?  ? Check-In  ? Supervising physician immediately available to respond to emergencies [facility] MD immediately available   ? Physician(s) Dr Harl Bowie   ? Location [facility]   ? Staff Present Aundra Dubin, RN, Joanette Gula, RN, BSN;Heather Otho Ket, BS, Exercise Physiologist   ? Virtual Visit No   ? Medication changes reported     No   ? Fall or balance concerns reported    No   ? Comments Patient says he loses his balance often and gets dizzy alot. He ambulates using a straight cane.   ? Tobacco Cessation No Change   ? Warm-up and Cool-down Performed as group-led instruction   ? Resistance Training Performed Yes   ? VAD Patient? No   ? PAD/SET Patient? No   ?  ? PAD/SET Patient  ? Completed foot check today? No   ? Open wounds to report? No   ?  ? Pain Assessment  ? Currently in Pain? No/denies   ? Pain Score 0-No pain   ? Multiple Pain Sites No   ? ?  ?  ? ?  ? ? ?Capillary Blood Glucose: ?No results found for this or any previous visit (from the past 24 hour(s)). ? ? ? ?Social History  ? ?Tobacco Use  ?Smoking Status Former  ? Packs/day: 2.00  ? Years: 30.00  ? Pack years: 60.00  ? Types: Cigarettes  ? Quit date: 05/17/1989  ? Years since quitting: 32.2  ?Smokeless Tobacco Never  ? ? ?Goals Met:  ?Independence with exercise equipment ?Exercise tolerated well ?No report of concerns or symptoms today ?Strength training completed today ? ?Goals Unmet:  ?Not Applicable ? ?Comments: Check out 1200. ? ? ?Dr. Carlyle Dolly is Medical Director for [place] ?

## 2021-08-21 ENCOUNTER — Encounter (HOSPITAL_COMMUNITY)
Admission: RE | Admit: 2021-08-21 | Discharge: 2021-08-21 | Disposition: A | Discharge status: Home / Self Care | Payer: Medicare PPO | Source: Ambulatory Visit | Attending: Cardiology | Admitting: Cardiology

## 2021-08-21 DIAGNOSIS — Z951 Presence of aortocoronary bypass graft: Secondary | ICD-10-CM | POA: Diagnosis not present

## 2021-08-21 NOTE — Progress Notes (Signed)
Daily Session Note ? ?Patient Details  ?Name: Derek Bond ?MRN: [id number] ?Date of Birth: 1945/10/23 ?Referring Provider:   ?Flowsheet Row CARDIAC REHAB PHASE II ORIENTATION from 06/26/2021 in [place]  ?Referring Provider Dr. Kipp Brood  ? ?  ? ? ?Encounter Date: 08/21/2021 ? ?Check In: ? Session Check In - 08/21/21 1100   ? ?  ? Check-In  ? Supervising physician immediately available to respond to emergencies [facility] MD immediately available   ? Physician(s) Dr Harrington Challenger   ? Location [facility]   ? Staff Present Redge Gainer, BS, Exercise Physiologist;Rut Betterton Hassell Done, RN, Bjorn Loser, MS, ACSM-CEP, Exercise Physiologist   ? Virtual Visit No   ? Medication changes reported     No   ? Fall or balance concerns reported    No   ? Comments Patient says he loses his balance often and gets dizzy alot. He ambulates using a straight cane.   ? Tobacco Cessation No Change   ? Warm-up and Cool-down Performed as group-led instruction   ? Resistance Training Performed Yes   ? VAD Patient? No   ? PAD/SET Patient? No   ?  ? PAD/SET Patient  ? Completed foot check today? No   ? Open wounds to report? No   ?  ? Pain Assessment  ? Currently in Pain? No/denies   ? Pain Score 0-No pain   ? Multiple Pain Sites No   ? ?  ?  ? ?  ? ? ?Capillary Blood Glucose: ?No results found for this or any previous visit (from the past 24 hour(s)). ? ? ? ?Social History  ? ?Tobacco Use  ?Smoking Status Former  ? Packs/day: 2.00  ? Years: 30.00  ? Pack years: 60.00  ? Types: Cigarettes  ? Quit date: 05/17/1989  ? Years since quitting: 32.2  ?Smokeless Tobacco Never  ? ? ?Goals Met:  ?Independence with exercise equipment ?Exercise tolerated well ?No report of concerns or symptoms today ?Strength training completed today ? ?Goals Unmet:  ?Not Applicable ? ?Comments: Check out at 1200. ? ? ?Dr. Carlyle Dolly is Medical Director for [place] ?

## 2021-08-22 ENCOUNTER — Other Ambulatory Visit: Payer: Self-pay | Admitting: Cardiology

## 2021-08-24 ENCOUNTER — Encounter (HOSPITAL_COMMUNITY)
Admission: RE | Admit: 2021-08-24 | Discharge: 2021-08-24 | Disposition: A | Discharge status: Home / Self Care | Payer: Medicare PPO | Source: Ambulatory Visit | Attending: Cardiology | Admitting: Cardiology

## 2021-08-24 VITALS — Wt 185.6 lb

## 2021-08-24 DIAGNOSIS — Z951 Presence of aortocoronary bypass graft: Secondary | ICD-10-CM

## 2021-08-24 NOTE — Progress Notes (Signed)
Daily Session Note ? ?Patient Details  ?Name: Derek Bond ?MRN: [id number] ?Date of Birth: 04-18-1946 ?Referring Provider:   ?Flowsheet Row CARDIAC REHAB PHASE II ORIENTATION from 06/26/2021 in [place]  ?Referring Provider Dr. Kipp Brood  ? ?  ? ? ?Encounter Date: 08/24/2021 ? ?Check In: ? Session Check In - 08/24/21 1100   ? ?  ? Check-In  ? Supervising physician immediately available to respond to emergencies [facility] MD immediately available   ? Physician(s) Dr Johnsie Cancel   ? Staff Present Geanie Cooley, RN;Heather Otho Ket, BS, Exercise Physiologist;Tacora Athanas Hassell Done, RN, BSN   ? Virtual Visit No   ? Medication changes reported     No   ? Fall or balance concerns reported    No   ? Tobacco Cessation No Change   ? Warm-up and Cool-down Performed as group-led instruction   ? Resistance Training Performed Yes   ? VAD Patient? No   ? PAD/SET Patient? No   ?  ? PAD/SET Patient  ? Completed foot check today? No   ? Open wounds to report? No   ?  ? Pain Assessment  ? Currently in Pain? No/denies   ? Pain Score 0-No pain   ? Multiple Pain Sites No   ? ?  ?  ? ?  ? ? ?Capillary Blood Glucose: ?No results found for this or any previous visit (from the past 24 hour(s)). ? ? ? ?Social History  ? ?Tobacco Use  ?Smoking Status Former  ? Packs/day: 2.00  ? Years: 30.00  ? Pack years: 60.00  ? Types: Cigarettes  ? Quit date: 05/17/1989  ? Years since quitting: 32.2  ?Smokeless Tobacco Never  ? ? ?Goals Met:  ?Independence with exercise equipment ?Exercise tolerated well ?No report of concerns or symptoms today ?Strength training completed today ? ?Goals Unmet:  ?Not Applicable ? ?Comments: Check out at 1030. ? ? ?Dr. Carlyle Dolly is Medical Director for [place] ?

## 2021-08-26 ENCOUNTER — Encounter (HOSPITAL_COMMUNITY)
Admission: RE | Admit: 2021-08-26 | Discharge: 2021-08-26 | Disposition: A | Discharge status: Home / Self Care | Payer: Medicare PPO | Source: Ambulatory Visit | Attending: Cardiology | Admitting: Cardiology

## 2021-08-26 DIAGNOSIS — Z951 Presence of aortocoronary bypass graft: Secondary | ICD-10-CM | POA: Diagnosis not present

## 2021-08-26 NOTE — Progress Notes (Signed)
Daily Session Note ? ?Patient Details  ?Name: Derek Bond ?MRN: [id number] ?Date of Birth: 02-15-1946 ?Referring Provider:   ?Flowsheet Row CARDIAC REHAB PHASE II ORIENTATION from 06/26/2021 in [place]  ?Referring Provider Dr. Kipp Brood  ? ?  ? ? ?Encounter Date: 08/26/2021 ? ?Check In: ? Session Check In - 08/26/21 1100   ? ?  ? Check-In  ? Supervising physician immediately available to respond to emergencies [facility] MD immediately available   ? Physician(s) Dr Gasper Sells   ? Location [facility]   ? Staff Present Geanie Cooley, RN;Heather Otho Ket, BS, Exercise Physiologist;Farren Landa Hassell Done, RN, Bjorn Loser, MS, ACSM-CEP, Exercise Physiologist   ? Virtual Visit No   ? Medication changes reported     No   ? Fall or balance concerns reported    No   ? Comments Patient says he loses his balance often and gets dizzy alot. He ambulates using a straight cane.   ? Tobacco Cessation No Change   ? Warm-up and Cool-down Performed as group-led instruction   ? Resistance Training Performed Yes   ? VAD Patient? No   ? PAD/SET Patient? No   ?  ? PAD/SET Patient  ? Completed foot check today? No   ? Open wounds to report? No   ?  ? Pain Assessment  ? Currently in Pain? No/denies   ? Pain Score 0-No pain   ? Multiple Pain Sites No   ? ?  ?  ? ?  ? ? ?Capillary Blood Glucose: ?No results found for this or any previous visit (from the past 24 hour(s)). ? ? ? ?Social History  ? ?Tobacco Use  ?Smoking Status Former  ? Packs/day: 2.00  ? Years: 30.00  ? Pack years: 60.00  ? Types: Cigarettes  ? Quit date: 05/17/1989  ? Years since quitting: 32.2  ?Smokeless Tobacco Never  ? ? ?Goals Met:  ?Independence with exercise equipment ?Exercise tolerated well ?No report of concerns or symptoms today ?Strength training completed today ? ?Goals Unmet:  ?Not Applicable ? ?Comments: Checkout 1200. ? ? ?Dr. Carlyle Dolly is Medical Director for [place] ?

## 2021-08-28 ENCOUNTER — Encounter (HOSPITAL_COMMUNITY)
Admission: RE | Admit: 2021-08-28 | Discharge: 2021-08-28 | Disposition: A | Discharge status: Home / Self Care | Payer: Medicare PPO | Source: Ambulatory Visit | Attending: Cardiology | Admitting: Cardiology

## 2021-08-28 DIAGNOSIS — Z951 Presence of aortocoronary bypass graft: Secondary | ICD-10-CM

## 2021-08-28 NOTE — Progress Notes (Signed)
Daily Session Note ? ?Patient Details  ?Name: Derek Bond ?MRN: [id number] ?Date of Birth: 1945/09/22 ?Referring Provider:   ?Flowsheet Row CARDIAC REHAB PHASE II ORIENTATION from 06/26/2021 in [place]  ?Referring Provider Dr. Kipp Brood  ? ?  ? ? ?Encounter Date: 08/28/2021 ? ?Check In: ? Session Check In - 08/28/21 1100   ? ?  ? Check-In  ? Supervising physician immediately available to respond to emergencies [facility] MD immediately available   ? Physician(s) Nishan   ? Location [facility]   ? Staff Present Redge Gainer, BS, Exercise Physiologist;Evamae Rowen Wynetta Emery, RN, Bjorn Loser, MS, ACSM-CEP, Exercise Physiologist   ? Virtual Visit No   ? Medication changes reported     No   ? Fall or balance concerns reported    Yes   ? Comments Patient says he loses his balance often and gets dizzy alot. He ambulates using a straight cane.   ? Tobacco Cessation No Change   ? Warm-up and Cool-down Performed as group-led instruction   ? Resistance Training Performed Yes   ? VAD Patient? No   ? PAD/SET Patient? No   ?  ? Pain Assessment  ? Currently in Pain? No/denies   ? Pain Score 0-No pain   ? Multiple Pain Sites No   ? ?  ?  ? ?  ? ? ?Capillary Blood Glucose: ?No results found for this or any previous visit (from the past 24 hour(s)). ? ? ? ?Social History  ? ?Tobacco Use  ?Smoking Status Former  ? Packs/day: 2.00  ? Years: 30.00  ? Pack years: 60.00  ? Types: Cigarettes  ? Quit date: 05/17/1989  ? Years since quitting: 32.3  ?Smokeless Tobacco Never  ? ? ?Goals Met:  ?Independence with exercise equipment ?Exercise tolerated well ?No report of concerns or symptoms today ?Strength training completed today ? ?Goals Unmet:  ?Not Applicable ? ?Comments: Check out 1200. ? ? ?Dr. Carlyle Dolly is Medical Director for [place] ?

## 2021-08-31 ENCOUNTER — Encounter (HOSPITAL_COMMUNITY)
Admission: RE | Admit: 2021-08-31 | Discharge: 2021-08-31 | Disposition: A | Discharge status: Home / Self Care | Payer: Medicare PPO | Source: Ambulatory Visit | Attending: Cardiology | Admitting: Cardiology

## 2021-08-31 DIAGNOSIS — Z951 Presence of aortocoronary bypass graft: Secondary | ICD-10-CM

## 2021-08-31 NOTE — Progress Notes (Signed)
Daily Session Note ? ?Patient Details  ?Name: Derek Bond ?MRN: [id number] ?Date of Birth: [birth year] ?Referring Provider:   ?Flowsheet Row CARDIAC REHAB PHASE II ORIENTATION from 06/26/2021 in [place]  ?Referring Provider Dr. Kipp Brood  ? ?  ? ? ?Encounter Date: 08/31/2021 ? ?Check In: ? Session Check In - 08/31/21 1100   ? ?  ? Check-In  ? Supervising physician immediately available to respond to emergencies [facility] MD immediately available   ? Physician(s) Dr Johnsie Cancel   ? Location [facility]   ? Staff Present Redge Gainer, BS, Exercise Physiologist;Evita Merida Hassell Done, RN, Bjorn Loser, MS, ACSM-CEP, Exercise Physiologist   ? Virtual Visit No   ? Medication changes reported     No   ? Fall or balance concerns reported    Yes   ? Comments Patient says he loses his balance often and gets dizzy alot. He ambulates using a straight cane.   ? Tobacco Cessation No Change   ? Warm-up and Cool-down Performed as group-led instruction   ? Resistance Training Performed Yes   ? VAD Patient? No   ? PAD/SET Patient? No   ?  ? PAD/SET Patient  ? Completed foot check today? No   ? Open wounds to report? No   ?  ? Pain Assessment  ? Currently in Pain? No/denies   ? Pain Score 0-No pain   ? Multiple Pain Sites No   ? ?  ?  ? ?  ? ? ?Capillary Blood Glucose: ?No results found for this or any previous visit (from the past 24 hour(s)). ? ? ? ?Social History  ? ?Tobacco Use  ?Smoking Status Former  ? Packs/day: 2.00  ? Years: 30.00  ? Pack years: 60.00  ? Types: Cigarettes  ? Quit date: 05/17/1989  ? Years since quitting: 32.3  ?Smokeless Tobacco Never  ? ? ?Goals Met:  ?Independence with exercise equipment ?Exercise tolerated well ?No report of concerns or symptoms today ?Strength training completed today ? ?Goals Unmet:  ?Not Applicable ? ?Comments: Checkout at 1200. ? ? ?Dr. Carlyle Dolly is Medical Director for [place] ?

## 2021-09-01 ENCOUNTER — Other Ambulatory Visit: Payer: Self-pay | Admitting: Cardiology

## 2021-09-02 ENCOUNTER — Encounter (HOSPITAL_COMMUNITY)
Admission: RE | Admit: 2021-09-02 | Discharge: 2021-09-02 | Disposition: A | Discharge status: Home / Self Care | Payer: Medicare PPO | Source: Ambulatory Visit | Attending: Cardiology | Admitting: Cardiology

## 2021-09-02 DIAGNOSIS — Z951 Presence of aortocoronary bypass graft: Secondary | ICD-10-CM

## 2021-09-02 NOTE — Progress Notes (Signed)
Daily Session Note ? ?Patient Details  ?Name: Derek Bond ?MRN: [id number] ?Date of Birth: 11/26/1945 ?Referring Provider:   ?Flowsheet Row CARDIAC REHAB PHASE II ORIENTATION from 06/26/2021 in [place]  ?Referring Provider Dr. Kipp Brood  ? ?  ? ? ?Encounter Date: 09/02/2021 ? ?Check In: ? Session Check In - 09/02/21 1058   ? ?  ? Check-In  ? Supervising physician immediately available to respond to emergencies [facility] MD immediately available   ? Physician(s) Dr Harl Bowie   ? Location [facility]   ? Staff Present Madelyn Flavors, RN, Bjorn Loser, MS, ACSM-CEP, Exercise Physiologist;Heather Zigmund Daniel, Exercise Physiologist   ? Virtual Visit No   ? Medication changes reported     No   ? Fall or balance concerns reported    Yes   ? Comments Patient says he loses his balance often and gets dizzy alot. He ambulates using a straight cane.   ? Tobacco Cessation No Change   ? Warm-up and Cool-down Performed as group-led instruction   ? Resistance Training Performed Yes   ? VAD Patient? No   ? PAD/SET Patient? No   ?  ? PAD/SET Patient  ? Completed foot check today? No   ? Open wounds to report? No   ?  ? Pain Assessment  ? Currently in Pain? No/denies   ? Pain Score 0-No pain   ? Multiple Pain Sites No   ? ?  ?  ? ?  ? ? ?Capillary Blood Glucose: ?No results found for this or any previous visit (from the past 24 hour(s)). ? ? ? ?Social History  ? ?Tobacco Use  ?Smoking Status Former  ? Packs/day: 2.00  ? Years: 30.00  ? Pack years: 60.00  ? Types: Cigarettes  ? Quit date: 05/17/1989  ? Years since quitting: 32.3  ?Smokeless Tobacco Never  ? ? ?Goals Met:  ?Independence with exercise equipment ?Exercise tolerated well ?No report of concerns or symptoms today ?Strength training completed today ? ?Goals Unmet:  ?Not Applicable ? ?Comments: Checkout at 120. ? ? ?Dr. Carlyle Dolly is Medical Director for [place] ?

## 2021-09-04 ENCOUNTER — Encounter (HOSPITAL_COMMUNITY)
Admission: RE | Admit: 2021-09-04 | Discharge: 2021-09-04 | Disposition: A | Discharge status: Home / Self Care | Payer: Medicare PPO | Source: Ambulatory Visit | Attending: Cardiology | Admitting: Cardiology

## 2021-09-04 DIAGNOSIS — Z951 Presence of aortocoronary bypass graft: Secondary | ICD-10-CM | POA: Diagnosis not present

## 2021-09-04 NOTE — Progress Notes (Signed)
Daily Session Note ? ?Patient Details  ?Name: Derek Bond ?MRN: [id number] ?Date of Birth: 1945-09-11 ?Referring Provider:   ?Flowsheet Row CARDIAC REHAB PHASE II ORIENTATION from 06/26/2021 in [place]  ?Referring Provider Dr. Kipp Brood  ? ?  ? ? ?Encounter Date: 09/04/2021 ? ?Check In: ? Session Check In - 09/04/21 1059   ? ?  ? Check-In  ? Supervising physician immediately available to respond to emergencies [facility] MD immediately available   ? Physician(s) Dr Harrington Challenger   ? Location [facility]   ? Staff Present Madelyn Flavors, RN, BSN;Christy Edwards, RN, BSN;Heather Otho Ket, BS, Exercise Physiologist   ? Virtual Visit No   ? Medication changes reported     No   ? Fall or balance concerns reported    Yes   ? Comments Patient says he loses his balance often and gets dizzy alot. He ambulates using a straight cane.   ? Tobacco Cessation No Change   ? Warm-up and Cool-down Performed as group-led instruction   ? Resistance Training Performed Yes   ? VAD Patient? No   ?  ? Pain Assessment  ? Currently in Pain? No/denies   ? Pain Score 0-No pain   ? Multiple Pain Sites No   ? ?  ?  ? ?  ? ? ?Capillary Blood Glucose: ?No results found for this or any previous visit (from the past 24 hour(s)). ? ? ? ?Social History  ? ?Tobacco Use  ?Smoking Status Former  ? Packs/day: 2.00  ? Years: 30.00  ? Pack years: 60.00  ? Types: Cigarettes  ? Quit date: 05/17/1989  ? Years since quitting: 32.3  ?Smokeless Tobacco Never  ? ? ?Goals Met:  ?Independence with exercise equipment ?Exercise tolerated well ?No report of concerns or symptoms today ?Strength training completed today ? ?Goals Unmet:  ?Not Applicable ? ?Comments: checkout at 1200. ? ? ?Dr. Carlyle Dolly is Medical Director for [place] ?

## 2021-09-07 ENCOUNTER — Encounter (HOSPITAL_COMMUNITY)
Admission: RE | Admit: 2021-09-07 | Discharge: 2021-09-07 | Disposition: A | Discharge status: Home / Self Care | Payer: Medicare PPO | Source: Ambulatory Visit | Attending: Cardiology | Admitting: Cardiology

## 2021-09-07 VITALS — Wt 187.8 lb

## 2021-09-07 DIAGNOSIS — Z951 Presence of aortocoronary bypass graft: Secondary | ICD-10-CM

## 2021-09-07 NOTE — Progress Notes (Signed)
Daily Session Note ? ?Patient Details  ?Name: Derek Bond ?MRN: [id number] ?Date of Birth: 07/13/1945 ?Referring Provider:   ?Flowsheet Row CARDIAC REHAB PHASE II ORIENTATION from 06/26/2021 in [place]  ?Referring Provider Dr. Kipp Brood  ? ?  ? ? ?Encounter Date: 09/07/2021 ? ?Check In: ? Session Check In - 09/07/21 1100   ? ?  ? Check-In  ? Supervising physician immediately available to respond to emergencies [facility] MD immediately available   ? Physician(s) Chandrasekhar   ? Location [facility]   ? Staff Present Aundra Dubin, RN, BSN;Heather Otho Ket, BS, Exercise Physiologist   ? Virtual Visit No   ? Medication changes reported     No   ? Fall or balance concerns reported    Yes   ? Comments Patient says he loses his balance often and gets dizzy alot. He ambulates using a straight cane.   ? Tobacco Cessation No Change   ? Warm-up and Cool-down Performed as group-led instruction   ? Resistance Training Performed Yes   ? VAD Patient? No   ? PAD/SET Patient? No   ?  ? PAD/SET Patient  ? Completed foot check today? No   ? Open wounds to report? No   ?  ? Pain Assessment  ? Currently in Pain? No/denies   ? Pain Score 0-No pain   ? Multiple Pain Sites No   ? ?  ?  ? ?  ? ? ?Capillary Blood Glucose: ?No results found for this or any previous visit (from the past 24 hour(s)). ? ? ? ?Social History  ? ?Tobacco Use  ?Smoking Status Former  ? Packs/day: 2.00  ? Years: 30.00  ? Pack years: 60.00  ? Types: Cigarettes  ? Quit date: 05/17/1989  ? Years since quitting: 32.3  ?Smokeless Tobacco Never  ? ? ?Goals Met:  ?Independence with exercise equipment ?Exercise tolerated well ?No report of concerns or symptoms today ?Strength training completed today ? ?Goals Unmet:  ?Not Applicable ? ?Comments: Check out 1200. ? ? ?Dr. Carlyle Dolly is Medical Director for [place] ?

## 2021-09-08 ENCOUNTER — Other Ambulatory Visit: Payer: Self-pay | Admitting: Cardiology

## 2021-09-09 ENCOUNTER — Encounter (HOSPITAL_COMMUNITY)
Admission: RE | Admit: 2021-09-09 | Discharge: 2021-09-09 | Disposition: A | Discharge status: Home / Self Care | Payer: Medicare PPO | Source: Ambulatory Visit | Attending: Cardiology | Admitting: Cardiology

## 2021-09-09 DIAGNOSIS — Z951 Presence of aortocoronary bypass graft: Secondary | ICD-10-CM

## 2021-09-09 NOTE — Progress Notes (Signed)
Daily Session Note ? ?Patient Details  ?Name: Derek Bond ?MRN: [id number] ?Date of Birth: [birth year] ?Referring Provider:   ?Flowsheet Row CARDIAC REHAB PHASE II ORIENTATION from 06/26/2021 in [place]  ?Referring Provider Dr. Kipp Brood  ? ?  ? ? ?Encounter Date: 09/09/2021 ? ?Check In: ? Session Check In - 09/09/21 1100   ? ?  ? Check-In  ? Supervising physician immediately available to respond to emergencies [facility] MD immediately available   ? Physician(s) Dr. Domenic Polite   ? Location [facility]   ? Staff Present Redge Gainer, BS, Exercise Physiologist;Exodus Kutzer Kris Mouton, MS, ACSM-CEP, Exercise Physiologist   ? Virtual Visit No   ? Medication changes reported     No   ? Fall or balance concerns reported    Yes   ? Comments Patient says he loses his balance often and gets dizzy alot. He ambulates using a straight cane.   ? Tobacco Cessation No Change   ? Warm-up and Cool-down Performed as group-led instruction   ? Resistance Training Performed Yes   ? VAD Patient? No   ? PAD/SET Patient? No   ?  ? Pain Assessment  ? Currently in Pain? No/denies   ? Pain Score 0-No pain   ? Multiple Pain Sites No   ? ?  ?  ? ?  ? ? ?Capillary Blood Glucose: ?No results found for this or any previous visit (from the past 24 hour(s)). ? ? ? ?Social History  ? ?Tobacco Use  ?Smoking Status Former  ? Packs/day: 2.00  ? Years: 30.00  ? Pack years: 60.00  ? Types: Cigarettes  ? Quit date: 05/17/1989  ? Years since quitting: 32.3  ?Smokeless Tobacco Never  ? ? ?Goals Met:  ?Independence with exercise equipment ?Exercise tolerated well ?No report of concerns or symptoms today ?Strength training completed today ? ?Goals Unmet:  ?Not Applicable ? ?Comments: checkout time is 1200 ? ? ?Dr. Carlyle Dolly is Medical Director for [place] ?

## 2021-09-09 NOTE — Progress Notes (Signed)
Cardiac Individual Treatment Plan ? ?Patient Details  ?Name: Derek Bond ?MRN: [id number] ?Date of Birth: 1945/11/05 ?Referring Provider:   ?Flowsheet Row CARDIAC REHAB PHASE II ORIENTATION from 06/26/2021 in [place]  ?Referring Provider Dr. Kipp Brood  ? ?  ? ? ?Initial Encounter Date:  ?Flowsheet Row CARDIAC REHAB PHASE II ORIENTATION from 06/26/2021 in [place]  ?Date 06/26/21  ? ?  ? ? ?Visit Diagnosis: S/P CABG x 4 ? ?Patient's Home Medications on Admission: ? ?Current Outpatient Medications:  ?  acetaminophen (TYLENOL) 500 MG tablet, Take 1,000 mg by mouth every 6 (six) hours as needed for moderate pain or mild pain., Disp: , Rfl:  ?  ALPRAZolam (XANAX) 0.5 MG tablet, Take 0.5 mg by mouth at bedtime as needed for sleep., Disp: , Rfl:  ?  amiodarone (PACERONE) 200 MG tablet, Take 0.5 tablets (100 mg total) by mouth daily., Disp: 30 tablet, Rfl: 0 ?  apixaban (ELIQUIS) 5 MG TABS tablet, Take 1 tablet (5 mg total) by mouth 2 (two) times daily., Disp: 180 tablet, Rfl: 0 ?  aspirin EC 81 MG tablet, Take 81 mg by mouth daily., Disp: , Rfl:  ?  benzonatate (TESSALON) 200 MG capsule, Take 200 mg by mouth 3 (three) times daily as needed for cough., Disp: , Rfl:  ?  dapagliflozin propanediol (FARXIGA) 10 MG TABS tablet, Take 1 tablet (10 mg total) by mouth daily., Disp: 90 tablet, Rfl: 0 ?  famotidine (PEPCID) 40 MG tablet, Take 40 mg by mouth at bedtime., Disp: , Rfl:  ?  feeding supplement (ENSURE ENLIVE / ENSURE PLUS) LIQD, Take 237 mLs by mouth 2 (two) times daily between meals. Until oral intake improves, Disp: 237 mL, Rfl: 12 ?  furosemide (LASIX) 40 MG tablet, TAKE 1 TABLET BY MOUTH EVERY DAY, Disp: 30 tablet, Rfl: 0 ?  glipiZIDE (GLUCOTROL XL) 10 MG 24 hr tablet, Take 10 mg by mouth 2 (two) times daily., Disp: , Rfl:  ?  KLOR-CON M10 10 MEQ tablet, TAKE 1 TABLET BY MOUTH EVERY DAY, Disp: 30 tablet, Rfl: 0 ?  Magnesium Oxide 400 MG CAPS, Take 1 capsule (400 mg  total) by mouth daily., Disp: 90 capsule, Rfl: 0 ?  metFORMIN (GLUCOPHAGE) 1000 MG tablet, Take 1,000 mg by mouth daily with breakfast., Disp: , Rfl:  ?  metoprolol succinate (TOPROL-XL) 25 MG 24 hr tablet, TAKE 1 TABLET (25 MG TOTAL) BY MOUTH DAILY., Disp: 90 tablet, Rfl: 0 ?  pantoprazole (PROTONIX) 40 MG tablet, Take 40 mg by mouth every evening., Disp: , Rfl:  ?  rosuvastatin (CRESTOR) 5 MG tablet, Take 5 mg by mouth at bedtime., Disp: , Rfl:  ?  sacubitril-valsartan (ENTRESTO) 97-103 MG, Take 1 tablet by mouth 2 (two) times daily., Disp: 180 tablet, Rfl: 1 ?  traMADol (ULTRAM) 50 MG tablet, Take by mouth every 6 (six) hours as needed., Disp: , Rfl:  ? ?Past Medical History: ?Past Medical History:  ?Diagnosis Date  ? Atrial fibrillation ([place])   ? CAD (coronary artery disease)   ? Mild nonobstructive disease at cardiac catheterization 2009  ? Cancer [facility])   ? Melanoma  ? Essential hypertension   ? 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 Hiatal hernia   ? History of melanoma   ? Obstructive sleep apnea   ? Type 2 diabetes mellitus ([place])   ? ? ?Tobacco Use: ?Social History  ? ?Tobacco Use  ?  Smoking Status Former  ? Packs/day: 2.00  ? Years: 30.00  ? Pack years: 60.00  ? Types: Cigarettes  ? Quit date: 05/17/1989  ? Years since quitting: 32.3  ?Smokeless Tobacco Never  ? ? ?Labs: ?Review Flowsheet   ? ?  ?  Latest Ref Rng & Units 08/01/2007 02/22/2021 02/23/2021 02/26/2021  ?Labs for ITP Cardiac and Pulmonary Rehab  ?Cholestrol 0 - 200 mg/dL 173        ?ATP III CLASSIFICATION: ? <200     mg/dL   Desirable ? 200-239  mg/dL   Borderline High ? >=240    mg/dL   High   99      ?LDL (calc) 0 - 99 mg/dL 117        ?Total Cholesterol/HDL:CHD Risk ?Coronary Heart Disease Risk Table ?                    Men   Women ? 1/2 Average Risk   3.4   3.3   44      ?HDL-C >40 mg/dL 30   44      ?Trlycerides <150 mg/dL 129   56      ?Hemoglobin A1c 4.8 - 5.6 %  8.1      ?PH, Arterial 7.350 - 7.450   7.372   7.339     ? 7.425    ? 7.436    ? 7.436    ? 7.441    ? 7.464    ? 7.457    ? 7.400    ? 7.321    ?PCO2 arterial 32.0 - 48.0 mmHg   42.4   33.9    ? 37.5    ? 39.7    ? 38.9    ? 40.3    ? 37.3    ? 38.7    ? 43.1    ? 47.9    ?Bicarbonate 20.0 - 28.0 mmol/L   24.6    ? 25.7   18.2    ? 24.6    ? 26.7    ? 26.2    ? 27.4    ? 26.7    ? 27.3    ? 26.0    ? 26.7    ? 24.7    ?TCO2 22 - 32 mmol/L   26    ? 27   19    ? 26    ? 27    ? 28    ? 27    ? 25    ? 29    ? 26    ? 28    ? 28    ? 27    ? 28    ? 28    ? 26    ? 26    ?Acid-base deficit 0.0 - 2.0 mmol/L   1.0    ? 1.0   7.0    ? 2.0    ?O2 Saturation %   99.0    ? 75.0   99.0    ? 99.0    ? 100.0    ? 99.0    ? 100.0    ? 100.0    ? 100.0    ? 88.0    ? 100.0    ? 99.0    ? ?  06/10/2021  ?Labs for ITP Cardiac and Pulmonary Rehab  ?Cholestrol   ?LDL (calc)   ?HDL-C   ?Trlycerides   ?Hemoglobin A1c   ?PH, Arterial   ?  PCO2 arterial   ?Bicarbonate   ?TCO2 25    ?Acid-base deficit   ?O2 Saturation   ?  ? ? Multiple values from one day are sorted in reverse-chronological order  ?  ?  ? ? ?Capillary Blood Glucose: ?Lab Results  ?Component Value Date  ? GLUCAP 241 (H) 06/26/2021  ? GLUCAP 125 (H) 06/10/2021  ? GLUCAP 148 (H) 04/14/2021  ? GLUCAP 131 (H) 04/14/2021  ? GLUCAP 167 (H) 04/02/2021  ? ? ? ?Exercise Target Goals: ?Exercise Program Goal: ?Individual exercise prescription set using results from initial 6 min walk test and THRR while considering  patient?s activity barriers and safety.  ? ?Exercise Prescription Goal: ?Starting with aerobic activity 30 plus minutes a day, 3 days per week for initial exercise prescription. Provide home exercise prescription and guidelines that participant acknowledges understanding prior to discharge. ? ?Activity Barriers & Risk Stratification: ? Activity Barriers & Cardiac Risk Stratification - 06/26/21 1250   ? ?  ? Activity Barriers & Cardiac Risk Stratification  ? Activity Barriers Left Knee Replacement;Deconditioning;Shortness of  Breath;Balance Concerns;Assistive Device;Chest Pain/Angina   ? Cardiac Risk Stratification High   ? ?  ?  ? ?  ? ? ?6 Minute Walk: ? 6 Minute Walk   ? ? [place] Name 06/26/21 1407  ?  ?  ?  ? 6 Minute Walk  ? Phase Initial    ? Distance 800 feet    ? Walk Time 6 minutes    ? MPH 1.5    ? METS 2.16    ? RPE 11    ? VO2 Peak 7.56    ? Symptoms No    ? Resting HR 72 bpm    ? Resting BP 100/50    ? Resting Oxygen Saturation  94 %    ? Exercise Oxygen Saturation  during 6 min walk 95 %    ? Max Ex. HR 85 bpm    ? Max Ex. BP 115/58    ? 2 Minute Post BP 102/58    ? ?  ?  ? ?  ? ? ?Oxygen Initial Assessment: ? ? ?Oxygen Re-Evaluation: ? ? ?Oxygen Discharge (Final Oxygen Re-Evaluation): ? ? ?Initial Exercise Prescription: ? Initial Exercise Prescription - 06/26/21 1400   ? ?  ? Date of Initial Exercise RX and Referring Provider  ? Date 06/26/21   ? Referring Provider Dr. Kipp Brood   ? Expected Discharge Date 09/11/21   ?  ? NuStep  ? Level 1   ? SPM 60   ? Minutes 22   ?  ? Arm Ergometer  ? Level 1   ? RPM 50   ? Minutes 17   ?  ? Prescription Details  ? Frequency (times per week) 3   ? Duration Progress to 30 minutes of continuous aerobic without signs/symptoms of physical distress   ?  ? Intensity  ? THRR 40-80% of Max Heartrate 58-116   ? Ratings of Perceived Exertion 11-13   ? Perceived Dyspnea 0-4   ?  ? Resistance Training  ? Training Prescription Yes   ? Weight 2   ? Reps 10-15   ? ?  ?  ? ?  ? ? ?Perform Capillary Blood Glucose checks as needed. ? ?Exercise Prescription Changes: ? ? Exercise Prescription Changes   ? ? [place] Name 06/29/21 1400 07/06/21 1100 07/13/21 1200 07/28/21 1200 08/10/21 1500  ?  ? Response to Exercise  ? Blood Pressure (Admit) 128/70 -- 116/68 112/60  126/74  ? Blood Pressure (Exercise) 125/70 -- 124/64 124/76 132/64  ? Blood Pressure (Exit) 120/60 -- 110/62 112/68 124/76  ? Heart Rate (Admit) 84 bpm -- 84 bpm 89 bpm 85 bpm  ? Heart Rate (Exercise) 89 bpm -- 102 bpm 108 bpm 108 bpm  ? Heart Rate  (Exit) 81 bpm -- 93 bpm 98 bpm 94 bpm  ? Rating of Perceived Exertion (Exercise) 12 -- '13 12 11  '$ ? Duration Continue with 30 min of aerobic exercise without signs/symptoms of physical distress. -- Continue w

## 2021-09-11 ENCOUNTER — Encounter (HOSPITAL_COMMUNITY)
Admission: RE | Admit: 2021-09-11 | Discharge: 2021-09-11 | Disposition: A | Discharge status: Home / Self Care | Payer: Medicare PPO | Source: Ambulatory Visit | Attending: Cardiology | Admitting: Cardiology

## 2021-09-11 DIAGNOSIS — Z951 Presence of aortocoronary bypass graft: Secondary | ICD-10-CM

## 2021-09-11 NOTE — Progress Notes (Signed)
Daily Session Note ? ?Patient Details  ?Name: Derek Bond ?MRN: [id number] ?Date of Birth: [birth year] ?Referring Provider:   ?Flowsheet Row CARDIAC REHAB PHASE II ORIENTATION from 06/26/2021 in [place]  ?Referring Provider Dr. Kipp Brood  ? ?  ? ? ?Encounter Date: 09/11/2021 ? ?Check In: ? Session Check In - 09/11/21 1100   ? ?  ? Check-In  ? Supervising physician immediately available to respond to emergencies [facility] MD immediately available   ? Physician(s) Dr. Domenic Polite   ? Location [facility]   ? Staff Present Aundra Dubin, RN, BSN;Heather Otho Ket, BS, Exercise Physiologist   ? Virtual Visit No   ? Medication changes reported     No   ? Fall or balance concerns reported    No   ? Comments Patient says he loses his balance often and gets dizzy alot. He ambulates using a straight cane.   ? Warm-up and Cool-down Performed as group-led instruction   ? Resistance Training Performed Yes   ? VAD Patient? No   ? PAD/SET Patient? No   ?  ? PAD/SET Patient  ? Completed foot check today? No   ?  ? Pain Assessment  ? Currently in Pain? No/denies   ? Multiple Pain Sites No   ? ?  ?  ? ?  ? ? ?Capillary Blood Glucose: ?No results found for this or any previous visit (from the past 24 hour(s)). ? ? ? ?Social History  ? ?Tobacco Use  ?Smoking Status Former  ? Packs/day: 2.00  ? Years: 30.00  ? Pack years: 60.00  ? Types: Cigarettes  ? Quit date: 05/17/1989  ? Years since quitting: 32.3  ?Smokeless Tobacco Never  ? ? ?Goals Met:  ?Independence with exercise equipment ?Exercise tolerated well ?No report of concerns or symptoms today ?Strength training completed today ? ?Goals Unmet:  ?Not Applicable ? ?Comments: Check out at 1200. ? ? ?Dr. Carlyle Dolly is Medical Director for [place] ?

## 2021-09-13 DIAGNOSIS — I10 Essential (primary) hypertension: Secondary | ICD-10-CM | POA: Diagnosis not present

## 2021-09-13 DIAGNOSIS — E1165 Type 2 diabetes mellitus with hyperglycemia: Secondary | ICD-10-CM | POA: Diagnosis not present

## 2021-09-14 ENCOUNTER — Encounter (HOSPITAL_COMMUNITY)
Admission: RE | Admit: 2021-09-14 | Discharge: 2021-09-14 | Disposition: A | Discharge status: Home / Self Care | Payer: Medicare PPO | Source: Ambulatory Visit | Attending: Cardiology | Admitting: Cardiology

## 2021-09-14 DIAGNOSIS — Z951 Presence of aortocoronary bypass graft: Secondary | ICD-10-CM | POA: Diagnosis not present

## 2021-09-14 NOTE — Progress Notes (Signed)
Daily Session Note ? ?Patient Details  ?Name: Derek Bond ?MRN: [id number] ?Date of Birth: [birth year] ?Referring Provider:   ?Flowsheet Row CARDIAC REHAB PHASE II ORIENTATION from 06/26/2021 in [place]  ?Referring Provider Dr. Kipp Brood  ? ?  ? ? ?Encounter Date: 09/14/2021 ? ?Check In: ? Session Check In - 09/14/21 1100   ? ?  ? Check-In  ? Supervising physician immediately available to respond to emergencies [facility] MD immediately available   ? Physician(s) Dr Harl Bowie   ? Staff Present Aundra Dubin, RN, BSN;Heather Otho Ket, BS, Exercise Physiologist;Landyn Lorincz Hassell Done, RN, BSN   ? Virtual Visit No   ? Medication changes reported     No   ? Fall or balance concerns reported    No   ? Comments Patient says he loses his balance often and gets dizzy alot. He ambulates using a straight cane.   ? Tobacco Cessation No Change   ? Warm-up and Cool-down Performed as group-led instruction   ? Resistance Training Performed Yes   ? VAD Patient? No   ? PAD/SET Patient? No   ?  ? PAD/SET Patient  ? Completed foot check today? No   ? Open wounds to report? No   ?  ? Pain Assessment  ? Currently in Pain? No/denies   ? Pain Score 0-No pain   ? Multiple Pain Sites No   ? ?  ?  ? ?  ? ? ?Capillary Blood Glucose: ?No results found for this or any previous visit (from the past 24 hour(s)). ? ? ? ?Social History  ? ?Tobacco Use  ?Smoking Status Former  ? Packs/day: 2.00  ? Years: 30.00  ? Pack years: 60.00  ? Types: Cigarettes  ? Quit date: 05/17/1989  ? Years since quitting: 32.3  ?Smokeless Tobacco Never  ? ? ?Goals Met:  ?Independence with exercise equipment ?Exercise tolerated well ?No report of concerns or symptoms today ?Strength training completed today ? ?Goals Unmet:  ?Not Applicable ? ?Comments: check out at 1200. ? ? ?Dr. Carlyle Dolly is Medical Director for [place] ?

## 2021-09-16 ENCOUNTER — Encounter (HOSPITAL_COMMUNITY)
Admission: RE | Admit: 2021-09-16 | Discharge: 2021-09-16 | Disposition: A | Discharge status: Home / Self Care | Payer: Medicare PPO | Source: Ambulatory Visit | Attending: Cardiology | Admitting: Cardiology

## 2021-09-16 DIAGNOSIS — Z951 Presence of aortocoronary bypass graft: Secondary | ICD-10-CM

## 2021-09-16 NOTE — Progress Notes (Signed)
Daily Session Note ? ?Patient Details  ?Name: Derek Bond ?MRN: [id number] ?Date of Birth: 1946/03/25 ?Referring Provider:   ?Flowsheet Row CARDIAC REHAB PHASE II ORIENTATION from 06/26/2021 in [place]  ?Referring Provider Dr. Kipp Brood  ? ?  ? ? ?Encounter Date: 09/16/2021 ? ?Check In: ? Session Check In - 09/16/21 1100   ? ?  ? Check-In  ? Supervising physician immediately available to respond to emergencies [facility] MD immediately available   ? Physician(s) Dr. Gardiner Rhyme   ? Location [facility]-Cardiac [facility] Pulmonary Re[facility]   ? Staff Present Aundra Dubin, RN, BSN;Madasyn Heath Otho Ket, BS, Exercise Physiologist;Dalton Kris Mouton, MS, ACSM-CEP, Exercise Physiologist   ? Virtual Visit No   ? Medication changes reported     No   ? Fall or balance concerns reported    No   ? Tobacco Cessation No Change   ? Warm-up and Cool-down Performed as group-led instruction   ? Resistance Training Performed Yes   ? VAD Patient? No   ? PAD/SET Patient? No   ?  ? Pain Assessment  ? Currently in Pain? No/denies   ? Pain Score 0-No pain   ? Multiple Pain Sites No   ? ?  ?  ? ?  ? ? ?Capillary Blood Glucose: ?No results found for this or any previous visit (from the past 24 hour(s)). ? ? ? ?Social History  ? ?Tobacco Use  ?Smoking Status Former  ? Packs/day: 2.00  ? Years: 30.00  ? Pack years: 60.00  ? Types: Cigarettes  ? Quit date: 05/17/1989  ? Years since quitting: 32.3  ?Smokeless Tobacco Never  ? ? ?Goals Met:  ?Independence with exercise equipment ?Exercise tolerated well ?No report of concerns or symptoms today ?Strength training completed today ? ?Goals Unmet:  ?Not Applicable ? ?Comments: check out 1200 ? ? ?Dr. Carlyle Dolly is Medical Director for [place] ?

## 2021-09-18 ENCOUNTER — Encounter (HOSPITAL_COMMUNITY)
Admission: RE | Admit: 2021-09-18 | Discharge: 2021-09-18 | Disposition: A | Discharge status: Home / Self Care | Payer: Medicare PPO | Source: Ambulatory Visit | Attending: Cardiology | Admitting: Cardiology

## 2021-09-18 VITALS — Ht 78.0 in | Wt 186.1 lb

## 2021-09-18 DIAGNOSIS — Z951 Presence of aortocoronary bypass graft: Secondary | ICD-10-CM | POA: Diagnosis not present

## 2021-09-18 NOTE — Progress Notes (Signed)
Daily Session Note ? ?Patient Details  ?Name: Derek Bond ?MRN: [id number] ?Date of Birth: 10/12/1945 ?Referring Provider:   ?Flowsheet Row CARDIAC REHAB PHASE II ORIENTATION from 06/26/2021 in [place] [facility]  ?Referring Provider Dr. Lightfoot  ? ?  ? ? ?Encounter Date: 09/18/2021 ? ?Check In: ? Session Check In - 09/18/21 1100   ? ?  ? Check-In  ? Supervising physician immediately available to respond to emergencies [facility] MD immediately available   ? Physician(s) Dr Turner   ? Location [facility]   ? Staff Present Daphyne Martin, RN, BSN;Dalton Fletcher, MS, ACSM-CEP, Exercise Physiologist;Heather Jachimiak, BS, Exercise Physiologist   ? Virtual Visit No   ? Medication changes reported     No   ? Fall or balance concerns reported    No   ? Comments Patient says he loses his balance often and gets dizzy alot. He ambulates using a straight cane.   ? Tobacco Cessation No Change   ? Warm-up and Cool-down Performed as group-led instruction   ? Resistance Training Performed Yes   ? VAD Patient? No   ? PAD/SET Patient? No   ?  ? PAD/SET Patient  ? Completed foot check today? No   ? Open wounds to report? No   ?  ? Pain Assessment  ? Currently in Pain? No/denies   ? Pain Score 0-No pain   ? Multiple Pain Sites No   ? ?  ?  ? ?  ? ? ?Capillary Blood Glucose: ?No results found for this or any previous visit (from the past 24 hour(s)). ? ? ? ?Social History  ? ?Tobacco Use  ?Smoking Status Former  ? Packs/day: 2.00  ? Years: 30.00  ? Pack years: 60.00  ? Types: Cigarettes  ? Quit date: 05/17/1989  ? Years since quitting: 32.3  ?Smokeless Tobacco Never  ? ? ?Goals Met:  ?Independence with exercise equipment ?Exercise tolerated well ?No report of concerns or symptoms today ?Strength training completed today ? ?Goals Unmet:  ?Not Applicable ? ?Comments: Check out at 1200. ? ? ?Dr. Jonathan Branch is Medical Director for [facility] ?

## 2021-09-19 ENCOUNTER — Other Ambulatory Visit: Payer: Self-pay | Admitting: Cardiology

## 2021-09-21 NOTE — Progress Notes (Signed)
Discharge Progress Report ? ?Patient Details  ?Name: Derek Bond ?MRN: [id number] ?Date of Birth: 24-Oct-1945 ?Referring Provider:   ?Flowsheet Row CARDIAC REHAB PHASE II ORIENTATION from 06/26/2021 in [place]  ?Referring Provider Dr. Kipp Brood  ? ?  ? ? ? ?Number of Visits: 3 ? ?Reason for Discharge:  ?Patient reached a stable level of exercise. ?Patient independent in their exercise. ?Patient has met program and personal goals. ? ?Smoking History:  ?Social History  ? ?Tobacco Use  ?Smoking Status Former  ? Packs/day: 2.00  ? Years: 30.00  ? Pack years: 60.00  ? Types: Cigarettes  ? Quit date: 05/17/1989  ? Years since quitting: 32.3  ?Smokeless Tobacco Never  ? ? ?Diagnosis:  ?S/P CABG x 4 ? ?ADL UCSD: ? ? ?Initial Exercise Prescription: ? Initial Exercise Prescription - 06/26/21 1400   ? ?  ? Date of Initial Exercise RX and Referring Provider  ? Date 06/26/21   ? Referring Provider Dr. Kipp Brood   ? Expected Discharge Date 09/11/21   ?  ? NuStep  ? Level 1   ? SPM 60   ? Minutes 22   ?  ? Arm Ergometer  ? Level 1   ? RPM 50   ? Minutes 17   ?  ? Prescription Details  ? Frequency (times per week) 3   ? Duration Progress to 30 minutes of continuous aerobic without signs/symptoms of physical distress   ?  ? Intensity  ? THRR 40-80% of Max Heartrate 58-116   ? Ratings of Perceived Exertion 11-13   ? Perceived Dyspnea 0-4   ?  ? Resistance Training  ? Training Prescription Yes   ? Weight 2   ? Reps 10-15   ? ?  ?  ? ?  ? ? ?Discharge Exercise Prescription (Final Exercise Prescription Changes): ? Exercise Prescription Changes - 09/07/21 1200   ? ?  ? Response to Exercise  ? Blood Pressure (Admit) 120/60   ? Blood Pressure (Exercise) 140/70   ? Blood Pressure (Exit) 130/60   ? Heart Rate (Admit) 79 bpm   ? Heart Rate (Exercise) 107 bpm   ? Heart Rate (Exit) 92 bpm   ? Rating of Perceived Exertion (Exercise) 11   ? Duration Continue with 30 min of aerobic exercise without signs/symptoms of  physical distress.   ? Intensity THRR unchanged   ?  ? Progression  ? Progression Continue to progress workloads to maintain intensity without signs/symptoms of physical distress.   ?  ? Resistance Training  ? Training Prescription Yes   ? Weight 5   ? Reps 10-15   ? Time 10 Minutes   ?  ? NuStep  ? Level 4   ? SPM 96   ? Minutes 17   ? METs 2.96   ?  ? Arm Ergometer  ? Level 2   ? ?  ?  ? ?  ? ? ?Functional Capacity: ? 6 Minute Walk   ? ? [place] Name 06/26/21 1407 09/18/21 1132  ?  ?  ? 6 Minute Walk  ? Phase Initial Discharge   ? Distance 800 feet 1150 feet   ? Distance Feet Change -- 350 ft   ? Walk Time 6 minutes 6 minutes   ? # of Rest Breaks -- 0   ? MPH 1.5 2.17   ? METS 2.16 2.94   ? RPE 11 12   ? VO2 Peak 7.56 10.31   ? Symptoms  No No   ? Resting HR 72 bpm 78 bpm   ? Resting BP 100/50 126/62   ? Resting Oxygen Saturation  94 % 96 %   ? Exercise Oxygen Saturation  during 6 min walk 95 % 96 %   ? Max Ex. HR 85 bpm 95 bpm   ? Max Ex. BP 115/58 132/66   ? 2 Minute Post BP 102/58 118/62   ? ?  ?  ? ?  ? ? ?Psychological, QOL, Others - Outcomes: ?PHQ 2/9: ? ?  09/21/2021  ? 10:10 AM 06/26/2021  ?  1:51 PM  ?Depression screen PHQ 2/9  ?Decreased Interest 0 1  ?Down, Depressed, Hopeless 1 1  ?PHQ - 2 Score 1 2  ?Altered sleeping 2 0  ?Tired, decreased energy 2 3  ?Change in appetite 2 0  ?Feeling bad or failure about yourself  0 0  ?Trouble concentrating 1 3  ?Moving slowly or fidgety/restless 1 2  ?Suicidal thoughts 0 0  ?PHQ-9 Score 9 10  ?Difficult doing work/chores Somewhat difficult Somewhat difficult  ? ? ?Quality of Life: ? Quality of Life - 09/18/21 1134   ? ?  ? Quality of Life Scores  ? Health/Function Pre 20.57 %   ? Health/Function Post 22.29 %   ? Health/Function % Change 8.36 %   ? Socioeconomic Pre 21.79 %   ? Socioeconomic Post 24.67 %   ? Socioeconomic % Change  13.22 %   ? Psych/Spiritual Pre 23.14 %   ? Psych/Spiritual Post 25.71 %   ? Psych/Spiritual % Change 11.11 %   ? Family Pre 24 %   ? Family  Post 28.5 %   ? Family % Change 18.75 %   ? GLOBAL Pre 21.83 %   ? GLOBAL Post 24.32 %   ? GLOBAL % Change 11.41 %   ? ?  ?  ? ?  ? ? ?Personal Goals: ?Goals established at orientation with interventions provided to work toward goal. ? Personal Goals and Risk Factors at Admission - 06/26/21 1356   ? ?  ? Core Components/Risk Factors/Patient Goals on Admission  ?  Weight Management Weight Maintenance   ? Improve shortness of breath with ADL's Yes   ? Intervention Provide education, individualized exercise plan and daily activity instruction to help decrease symptoms of SOB with activities of daily living.   ? Expected Outcomes Short Term: Improve cardiorespiratory fitness to achieve a reduction of symptoms when performing ADLs;Long Term: Be able to perform more ADLs without symptoms or delay the onset of symptoms   ? Diabetes Yes   ? Intervention Provide education about signs/symptoms and action to take for hypo/hyperglycemia.;Provide education about proper nutrition, including hydration, and aerobic/resistive exercise prescription along with prescribed medications to achieve blood glucose in normal ranges: Fasting glucose 65-99 mg/dL   ? Expected Outcomes Short Term: Participant verbalizes understanding of the signs/symptoms and immediate care of hyper/hypoglycemia, proper foot care and importance of medication, aerobic/resistive exercise and nutrition plan for blood glucose control.   ? Lipids Yes   ? Intervention Provide education and support for participant on nutrition & aerobic/resistive exercise along with prescribed medications to achieve LDL <55m, HDL >488m   ? Expected Outcomes Short Term: Participant states understanding of desired cholesterol values and is compliant with medications prescribed. Participant is following exercise prescription and nutrition guidelines.;Long Term: Cholesterol controlled with medications as prescribed, with individualized exercise RX and with personalized nutrition plan.  Value goals: LDL < 7025mHDL >  40 mg.   ? Personal Goal Other Yes   ? Personal Goal Patient wants to improve his strength and stamina and be able to do his ADL's and activities outside in the Spring like gardening and yardwork.   ? Intervention Patient will attend CR 3 days/week with exercise and education and will supplement with exercise at home.   ? Expected Outcomes Lisabeth Register, RN, BSN   ? ?  ?  ? ?  ?  ? ?Personal Goals Discharge: ? Goals and Risk Factor Review   ? ? [place] Name 07/06/21 1336 08/03/21 1237 08/31/21 1046 09/21/21 1018  ?  ?  ? Core Components/Risk Factors/Patient Goals Review  ? Personal Goals Review Weight Management/Obesity;Diabetes;Heart Failure;Other;Improve shortness of breath with ADL's -- Weight Management/Obesity;Diabetes;Heart Failure;Other;Improve shortness of breath with ADL's Weight Management/Obesity;Diabetes;Heart Failure;Other;Improve shortness of breath with ADL's   ? Review Patient was referred to CR wtih CABGx4. He has multiple risk factors for CAD and is participating in the program for risk modification. His current weight is 182.4. He has completed 4 sessions. His blood pressure is at goals. His personal goals for the program are to increase his strength and stamina and be able to do his garden and lawn care this Spring and do his ADL's. We will continue to monitor his progress as he works towards meeting these goals. Patient has completed 16 sessions. His current weight is 181.6 down 1 lb since last 30 day review. He is doing well in the program with consistent attendance and progressions. His blood pressure continues to be well controlled. He was referred to Dr. Elsworth Soho for evaluation of OSA that he was diagnosed with over 15 years ago. He has not used a CPAP machine since he lose over 100 bls. Dr. Elsworth Soho ordered a home sleep study. His personal goals for the program continue to be to increase his strength and stamina and be able to do his garden and lawn care this Spring and  do his ADL's. We will continue to monitor his progress as he works towards meeting these goals. Patient has completed 28 sessions. His current weight is 186.1 up 4.5 lbs since last 30 day review. He continues to

## 2021-09-22 ENCOUNTER — Other Ambulatory Visit: Payer: Self-pay | Admitting: Cardiology

## 2021-09-22 ENCOUNTER — Encounter: Payer: Self-pay | Admitting: Cardiology

## 2021-09-22 ENCOUNTER — Ambulatory Visit: Payer: Medicare PPO | Admitting: Cardiology

## 2021-09-22 VITALS — BP 126/81 | HR 78 | Temp 98.4°F | Resp 16 | Ht 78.0 in | Wt 187.8 lb

## 2021-09-22 DIAGNOSIS — I10 Essential (primary) hypertension: Secondary | ICD-10-CM

## 2021-09-22 DIAGNOSIS — I447 Left bundle-branch block, unspecified: Secondary | ICD-10-CM

## 2021-09-22 DIAGNOSIS — I480 Paroxysmal atrial fibrillation: Secondary | ICD-10-CM

## 2021-09-22 DIAGNOSIS — I5022 Chronic systolic (congestive) heart failure: Secondary | ICD-10-CM

## 2021-09-22 DIAGNOSIS — Z9889 Other specified postprocedural states: Secondary | ICD-10-CM | POA: Diagnosis not present

## 2021-09-22 DIAGNOSIS — Z79899 Other long term (current) drug therapy: Secondary | ICD-10-CM | POA: Diagnosis not present

## 2021-09-22 DIAGNOSIS — Z8679 Personal history of other diseases of the circulatory system: Secondary | ICD-10-CM

## 2021-09-22 DIAGNOSIS — R809 Proteinuria, unspecified: Secondary | ICD-10-CM

## 2021-09-22 DIAGNOSIS — E782 Mixed hyperlipidemia: Secondary | ICD-10-CM

## 2021-09-22 DIAGNOSIS — I2510 Atherosclerotic heart disease of native coronary artery without angina pectoris: Secondary | ICD-10-CM

## 2021-09-22 DIAGNOSIS — Z951 Presence of aortocoronary bypass graft: Secondary | ICD-10-CM | POA: Diagnosis not present

## 2021-09-22 DIAGNOSIS — I255 Ischemic cardiomyopathy: Secondary | ICD-10-CM | POA: Diagnosis not present

## 2021-09-22 DIAGNOSIS — Z7901 Long term (current) use of anticoagulants: Secondary | ICD-10-CM

## 2021-09-22 DIAGNOSIS — E1129 Type 2 diabetes mellitus with other diabetic kidney complication: Secondary | ICD-10-CM | POA: Diagnosis not present

## 2021-09-22 MED ORDER — AMIODARONE HCL 200 MG PO TABS: 100.0000 mg | ORAL_TABLET | Freq: Every day | ORAL | 0 refills | Status: DC

## 2021-09-22 MED ORDER — POTASSIUM CHLORIDE CRYS ER 10 MEQ PO TBCR: 10.0000 meq | EXTENDED_RELEASE_TABLET | Freq: Every day | ORAL | 1 refills | Status: DC

## 2021-09-22 MED ORDER — APIXABAN 5 MG PO TABS: 5.0000 mg | ORAL_TABLET | Freq: Two times a day (BID) | ORAL | 1 refills | Status: DC

## 2021-09-22 MED ORDER — MAGNESIUM OXIDE 400 MG PO CAPS: 400.0000 mg | ORAL_CAPSULE | Freq: Every day | ORAL | 1 refills | Status: DC

## 2021-09-22 MED ORDER — ENTRESTO 97-103 MG PO TABS: 1.0000 | ORAL_TABLET | Freq: Two times a day (BID) | ORAL | 1 refills | Status: AC

## 2021-09-22 NOTE — Progress Notes (Signed)
? ?IDSuhaas Bond, DOB 1946/01/26, MRN [id number] ? ?PCP:  Glenda Chroman, MD  ?Cardiologist:  Rex Kras, DO, [facility] (established care 01/26/2021) ? ?Date: 09/22/21 ?Last Office Visit: 06/23/2021 ? ?Chief Complaint  ?Patient presents with  ? Follow-up  ?  CAD, HFrEF, A-fib  ? ? ?HPI  ?Derek Bond is a 76 y.o. male whose past medical history and cardiovascular risk factors include: Ischemic cardiomyopathy, acute/chronic heart failure with reduced EF, CAD s/p CABG / Clipping of atrial appendage using 54m atrial clip, hypertension, diabetes mellitus with microalbuminuria, erectile dysfunction, heartburn, former smoker, hyperlipidemia, sleep apnea not on CPAP, paroxysmal atrial fibrillation status post Maze procedure and DDCV (06/10/2021), advanced age. ? ?He is referred to the office at the request of VJerene BearsB, MD for evaluation of shortness of breath. ? ?Patient is accompanied by his daughter at today's office visit who provides collateral history during today's encounter.  Patient establish care back in September 2022 for progressive dyspnea.  After several ER visits for chest pain and dyspnea he underwent elective left heart catheterization and was noted to have multivessel CAD and subsequently underwent four-vessel CABG, left atrial Maze procedure, and 45 mm atrial clip placement and he was discharged home on 03/04/2021.  Post bypass surgery he had lower extremity cellulitis which required parenteral antibiotics and brief hospitalization.  He continued to feel tired and fatigue and short of breath despite surgical revascularization.  He subsequently underwent TEE guided cardioversion on June 12, 2021 and restored normal sinus rhythm.  He now presents for 325-monthollow-up visit. ? ?Over the last 3 months patient states that he is doing well from a cardiovascular standpoint.  He is no longer ambulating with a cane.  He is tired and fatigue still present but slowly improving, he is slowly regaining  his appetite and has gained 2 pounds over the last 3 months due to caloric intake, he is completed cardiac rehab. ? ?He remains on amiodarone for rhythm control given his congestive heart failure/CAD.  His ophthalmologically exam is still pending. ? ?FUNCTIONAL STATUS: ?No structured exercise program or daily routine.  ? ?ALLERGIES: ?Allergies  ?Allergen Reactions  ? Bee Venom Swelling  ? ? ?MEDICATION LIST PRIOR TO VISIT: ?Current Meds  ?Medication Sig  ? acetaminophen (TYLENOL) 500 MG tablet Take 1,000 mg by mouth every 6 (six) hours as needed for moderate pain or mild pain.  ? aspirin EC 81 MG tablet Take 81 mg by mouth daily.  ? famotidine (PEPCID) 40 MG tablet Take 40 mg by mouth at bedtime.  ? FARXIGA 10 MG TABS tablet TAKE 1 TABLET EVERY DAY  ? glipiZIDE (GLUCOTROL XL) 10 MG 24 hr tablet Take 10 mg by mouth 2 (two) times daily.  ? metFORMIN (GLUCOPHAGE) 1000 MG tablet Take 1,000 mg by mouth daily with breakfast.  ? metoprolol succinate (TOPROL-XL) 25 MG 24 hr tablet TAKE 1 TABLET (25 MG TOTAL) BY MOUTH DAILY.  ? pantoprazole (PROTONIX) 40 MG tablet Take 40 mg by mouth every evening.  ? rosuvastatin (CRESTOR) 5 MG tablet Take 5 mg by mouth at bedtime.  ? traMADol (ULTRAM) 50 MG tablet Take by mouth every 6 (six) hours as needed.  ? [DISCONTINUED] amiodarone (PACERONE) 200 MG tablet Take 0.5 tablets (100 mg total) by mouth daily.  ? [DISCONTINUED] apixaban (ELIQUIS) 5 MG TABS tablet Take 1 tablet (5 mg total) by mouth 2 (two) times daily.  ? [DISCONTINUED] KLOR-CON M10 10 MEQ tablet TAKE 1 TABLET BY MOUTH EVERY  DAY  ? [DISCONTINUED] Magnesium Oxide 400 MG CAPS Take 1 capsule (400 mg total) by mouth daily.  ? [DISCONTINUED] sacubitril-valsartan (ENTRESTO) 97-103 MG Take 1 tablet by mouth 2 (two) times daily.  ?  ? ?PAST MEDICAL HISTORY: ?Past Medical History:  ?Diagnosis Date  ? Atrial fibrillation ([place])   ? CAD (coronary artery disease)   ? Mild nonobstructive disease at cardiac catheterization 2009  ? Cancer  [facility])   ? Melanoma  ? Essential hypertension   ? 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 Due to laceration at age 25   ? Hiatal hernia   ? History of melanoma   ? Obstructive sleep apnea   ? Type 2 diabetes mellitus ([place])   ? ? ?PAST SURGICAL HISTORY: ?Past Surgical History:  ?Procedure Laterality Date  ? BIOPSY  12/21/2019  ? Procedure: BIOPSY;  Surgeon: Montez Morita, Quillian Quince, MD;  [facility];  Service: Gastroenterology;;  random colon  ? CARDIOVERSION N/A 06/10/2021  ? Procedure: CARDIOVERSION;  Surgeon: Rex Kras, DO;  [facility];  Service: Cardiovascular;  Laterality: N/A;  ? CLIPPING OF ATRIAL APPENDAGE N/A 02/26/2021  ? Procedure: CLIPPING OF ATRIAL APPENDAGE USING ATRICURE CLIP SIZE 45;  Surgeon: Lajuana Matte, MD;  Location: [place];  Service: Open Heart Surgery;  Laterality: N/A;  ? COLONOSCOPY WITH PROPOFOL N/A 12/21/2019  ? Procedure: COLONOSCOPY WITH PROPOFOL;  Surgeon: Harvel Quale, MD;  [facility];  Service: Gastroenterology;  Laterality: N/A;  915  ? CORONARY ARTERY BYPASS GRAFT N/A 02/26/2021  ? Procedure: CORONARY ARTERY BYPASS GRAFTING (CABG) X4, ON PUMP, USING LEFT INTERNAL MAMMARY ARTERY AND RIGHT ENDOSCOPICALLY HARVESTED GREATER SAPHENOUS VEIN;  Surgeon: Lajuana Matte, MD;  Location: [place];  Service: Open Heart Surgery;  Laterality: N/A;  ? ENDOVEIN HARVEST OF GREATER SAPHENOUS VEIN Right 02/26/2021  ? Procedure: ENDOVEIN HARVEST OF GREATER SAPHENOUS VEIN;  Surgeon: Lajuana Matte, MD;  Location: [place];  Service: Open Heart Surgery;  Laterality: Right;  ? MAZE N/A 02/26/2021  ? Procedure: MAZE;  Surgeon: Lajuana Matte, MD;  Location: [place];  Service: Open Heart Surgery;  Laterality: N/A;  ? MELANOMA EXCISION    ? Left facial region  ? REPLACEMENT TOTAL KNEE Left   ? RIGHT/LEFT HEART CATH AND CORONARY ANGIOGRAPHY N/A 02/23/2021  ? Procedure: RIGHT/LEFT HEART CATH AND CORONARY ANGIOGRAPHY;  Surgeon: Nigel Mormon, MD;   Location: [place] [facility];  Service: Cardiovascular;  Laterality: N/A;  ? TEE WITHOUT CARDIOVERSION N/A 02/26/2021  ? Procedure: TRANSESOPHAGEAL ECHOCARDIOGRAM (TEE);  Surgeon: Lajuana Matte, MD;  Location: [place];  Service: Open Heart Surgery;  Laterality: N/A;  ? TEE WITHOUT CARDIOVERSION N/A 06/10/2021  ? Procedure: TRANSESOPHAGEAL ECHOCARDIOGRAM (TEE);  Surgeon: Rex Kras, DO;  [facility];  Service: Cardiovascular;  Laterality: N/A;  ? UMBILICAL HERNIA REPAIR    ? ? ?FAMILY HISTORY: ?The patient family history includes Diabetes in his brother, mother, and another family member; Hypertension in his brother, brother, sister, sister, and another family member; Lung cancer in his father; Rheum arthritis in an other family member; Stroke in an other family member. ? ?SOCIAL HISTORY:  ?The patient  reports that he quit smoking about 32 years ago. His smoking use included cigarettes. He has a 60.00 pack-year smoking history. He has never used smokeless tobacco. He reports that he does not currently use alcohol. He reports that he does not use drugs. ? ?REVIEW OF SYSTEMS: ?Review of Systems  ?  Constitutional: Positive for malaise/fatigue (better) and weight gain. Negative for chills, fever and weight loss.  ?HENT:  Negative for hoarse voice and nosebleeds.   ?Eyes:  Negative for discharge, double vision and pain.  ?Cardiovascular:  Negative for chest pain, claudication, dyspnea on exertion, leg swelling, near-syncope, orthopnea, palpitations, paroxysmal nocturnal dyspnea and syncope.  ?Respiratory:  Negative for hemoptysis and shortness of breath.   ?Endocrine: Negative for heat intolerance.  ?Musculoskeletal:  Negative for muscle cramps and myalgias.  ?Gastrointestinal:  Negative for abdominal pain, constipation, diarrhea, heartburn, hematemesis, hematochezia, melena, nausea and vomiting.  ?Neurological:  Negative for dizziness and light-headedness.  ? ?PHYSICAL EXAM: ? ?  09/22/2021  ?  1:38 PM  09/18/2021  ? 11:33 AM 09/07/2021  ? 11:00 AM  ?Vitals with BMI  ?Height '6\' 6"'$  '6\' 6"'$    ?Weight 187 lbs 13 oz 186 lbs 1 oz 187 lbs 13 oz  ?BMI 21.71 21.51   ?Systolic 383    ?Diastolic 81    ?Pulse 78    ? ? ?C

## 2021-10-01 ENCOUNTER — Other Ambulatory Visit: Payer: Self-pay | Admitting: Cardiology

## 2021-10-05 ENCOUNTER — Other Ambulatory Visit: Payer: Self-pay | Admitting: Cardiology

## 2021-10-05 DIAGNOSIS — Z7901 Long term (current) use of anticoagulants: Secondary | ICD-10-CM | POA: Diagnosis not present

## 2021-10-05 DIAGNOSIS — I5022 Chronic systolic (congestive) heart failure: Secondary | ICD-10-CM | POA: Diagnosis not present

## 2021-10-05 DIAGNOSIS — Z79899 Other long term (current) drug therapy: Secondary | ICD-10-CM | POA: Diagnosis not present

## 2021-10-06 LAB — CMP14+EGFR
ALT: 15 IU/L (ref 0–44)
AST: 17 IU/L (ref 0–40)
Albumin/Globulin Ratio: 1.5 (ref 1.2–2.2)
Albumin: 4 g/dL (ref 3.7–4.7)
Alkaline Phosphatase: 108 IU/L (ref 44–121)
BUN/Creatinine Ratio: 27 — ABNORMAL HIGH (ref 10–24)
BUN: 21 mg/dL (ref 8–27)
Bilirubin Total: 0.6 mg/dL (ref 0.0–1.2)
CO2: 23 mmol/L (ref 20–29)
Calcium: 9.9 mg/dL (ref 8.6–10.2)
Chloride: 105 mmol/L (ref 96–106)
Creatinine, Ser: 0.77 mg/dL (ref 0.76–1.27)
Globulin, Total: 2.6 g/dL (ref 1.5–4.5)
Glucose: 228 mg/dL — ABNORMAL HIGH (ref 70–99)
Potassium: 4.6 mmol/L (ref 3.5–5.2)
Sodium: 141 mmol/L (ref 134–144)
Total Protein: 6.6 g/dL (ref 6.0–8.5)
eGFR: 93 mL/min/{1.73_m2} (ref >59)

## 2021-10-06 LAB — HEMOGLOBIN AND HEMATOCRIT, BLOOD
Hematocrit: 44.3 % (ref 37.5–51.0)
Hemoglobin: 14.4 g/dL (ref 13.0–17.7)

## 2021-10-06 LAB — TSH: TSH: 2.5 u[IU]/mL (ref 0.450–4.500)

## 2021-10-06 LAB — PRO B NATRIURETIC PEPTIDE: NT-Pro BNP: 455 pg/mL (ref 0–486)

## 2021-10-06 LAB — MAGNESIUM: Magnesium: 1.7 mg/dL (ref 1.6–2.3)

## 2021-10-12 ENCOUNTER — Other Ambulatory Visit: Payer: Self-pay | Admitting: Cardiology

## 2021-10-12 DIAGNOSIS — I5022 Chronic systolic (congestive) heart failure: Secondary | ICD-10-CM

## 2021-10-13 DIAGNOSIS — E1165 Type 2 diabetes mellitus with hyperglycemia: Secondary | ICD-10-CM | POA: Diagnosis not present

## 2021-10-13 DIAGNOSIS — I10 Essential (primary) hypertension: Secondary | ICD-10-CM | POA: Diagnosis not present

## 2021-10-13 NOTE — Progress Notes (Signed)
Called and spoke to pt, pt aware. He voiced understanding and will discuss this with his PCP.

## 2021-10-27 ENCOUNTER — Ambulatory Visit: Payer: Medicare PPO

## 2021-10-27 ENCOUNTER — Ambulatory Visit (HOSPITAL_COMMUNITY)
Admission: RE | Admit: 2021-10-27 | Discharge: 2021-10-27 | Disposition: A | Discharge status: Home / Self Care | Payer: Medicare PPO | Source: Ambulatory Visit | Attending: Cardiology | Admitting: Cardiology

## 2021-10-27 DIAGNOSIS — Z79899 Other long term (current) drug therapy: Secondary | ICD-10-CM | POA: Insufficient documentation

## 2021-10-27 DIAGNOSIS — R0683 Snoring: Secondary | ICD-10-CM

## 2021-10-27 DIAGNOSIS — G4733 Obstructive sleep apnea (adult) (pediatric): Secondary | ICD-10-CM | POA: Diagnosis not present

## 2021-10-27 LAB — PULMONARY FUNCTION TEST
DL/VA % pred: 133 %
DL/VA: 5.08 ml/min/mmHg/L
DLCO unc % pred: 82 %
DLCO unc: 26.16 ml/min/mmHg
FEF 25-75 Post: 2.85 L/sec
FEF 25-75 Pre: 2.19 L/sec
FEF2575-%Change-Post: 30 %
FEF2575-%Pred-Post: 96 %
FEF2575-%Pred-Pre: 73 %
FEV1-%Change-Post: 11 %
FEV1-%Pred-Post: 73 %
FEV1-%Pred-Pre: 66 %
FEV1-Post: 3.03 L
FEV1-Pre: 2.71 L
FEV1FVC-%Change-Post: 9 %
FEV1FVC-%Pred-Pre: 97 %
FEV6-%Change-Post: 1 %
FEV6-%Pred-Post: 73 %
FEV6-%Pred-Pre: 71 %
FEV6-Post: 3.89 L
FEV6-Pre: 3.83 L
FEV6FVC-%Pred-Post: 105 %
FEV6FVC-%Pred-Pre: 105 %
FVC-%Change-Post: 1 %
FVC-%Pred-Post: 69 %
FVC-%Pred-Pre: 68 %
FVC-Post: 3.89 L
FVC-Pre: 3.83 L
Post FEV1/FVC ratio: 78 %
Post FEV6/FVC ratio: 100 %
Pre FEV1/FVC ratio: 71 %
Pre FEV6/FVC Ratio: 100 %
RV % pred: 141 %
RV: 4.27 L
TLC % pred: 89 %
TLC: 7.72 L

## 2021-10-27 MED ORDER — ALBUTEROL SULFATE (2.5 MG/3ML) 0.083% IN NEBU
2.5000 mg | INHALATION_SOLUTION | Freq: Once | RESPIRATORY_TRACT | Status: AC
  Administered 2021-10-27: 2.5 mg via RESPIRATORY_TRACT

## 2021-10-30 ENCOUNTER — Telehealth: Payer: Self-pay | Admitting: Pulmonary Disease

## 2021-10-30 DIAGNOSIS — Z789 Other specified health status: Secondary | ICD-10-CM | POA: Diagnosis not present

## 2021-10-30 DIAGNOSIS — E1165 Type 2 diabetes mellitus with hyperglycemia: Secondary | ICD-10-CM | POA: Diagnosis not present

## 2021-10-30 DIAGNOSIS — M25561 Pain in right knee: Secondary | ICD-10-CM | POA: Diagnosis not present

## 2021-10-30 DIAGNOSIS — Z299 Encounter for prophylactic measures, unspecified: Secondary | ICD-10-CM | POA: Diagnosis not present

## 2021-10-30 DIAGNOSIS — I10 Essential (primary) hypertension: Secondary | ICD-10-CM | POA: Diagnosis not present

## 2021-10-30 DIAGNOSIS — G4733 Obstructive sleep apnea (adult) (pediatric): Secondary | ICD-10-CM | POA: Diagnosis not present

## 2021-10-30 DIAGNOSIS — I5022 Chronic systolic (congestive) heart failure: Secondary | ICD-10-CM | POA: Diagnosis not present

## 2021-10-30 NOTE — Telephone Encounter (Signed)
HST showed severe OSA with AHI 40/ hr & low O2 of 78% Suggest autoCPAP  5-15 cm, mask of choice OV with me in 6 wks  after starting

## 2021-11-02 NOTE — Telephone Encounter (Signed)
Spoke with patient regarding HST results. They verbalized understanding. No further questions.  Nothing further needed at this time. Patient was notified of importance of 6 week f/u for insurance purposes.

## 2021-11-10 ENCOUNTER — Other Ambulatory Visit: Payer: Self-pay

## 2021-11-10 DIAGNOSIS — I5022 Chronic systolic (congestive) heart failure: Secondary | ICD-10-CM

## 2021-11-10 MED ORDER — METOPROLOL SUCCINATE ER 25 MG PO TB24: 25.0000 mg | ORAL_TABLET | Freq: Every morning | ORAL | 0 refills | Status: DC

## 2021-11-11 ENCOUNTER — Other Ambulatory Visit: Payer: Self-pay

## 2021-11-11 DIAGNOSIS — I5022 Chronic systolic (congestive) heart failure: Secondary | ICD-10-CM

## 2021-11-11 MED ORDER — METOPROLOL SUCCINATE ER 25 MG PO TB24: 25.0000 mg | ORAL_TABLET | Freq: Every morning | ORAL | 0 refills | Status: DC

## 2021-11-12 DIAGNOSIS — E1165 Type 2 diabetes mellitus with hyperglycemia: Secondary | ICD-10-CM | POA: Diagnosis not present

## 2021-11-12 DIAGNOSIS — I10 Essential (primary) hypertension: Secondary | ICD-10-CM | POA: Diagnosis not present

## 2021-11-13 DIAGNOSIS — E7800 Pure hypercholesterolemia, unspecified: Secondary | ICD-10-CM | POA: Diagnosis not present

## 2021-11-13 DIAGNOSIS — I10 Essential (primary) hypertension: Secondary | ICD-10-CM | POA: Diagnosis not present

## 2021-11-13 DIAGNOSIS — E119 Type 2 diabetes mellitus without complications: Secondary | ICD-10-CM | POA: Diagnosis not present

## 2021-11-27 ENCOUNTER — Other Ambulatory Visit: Payer: Self-pay | Admitting: Cardiology

## 2021-11-27 DIAGNOSIS — Z299 Encounter for prophylactic measures, unspecified: Secondary | ICD-10-CM | POA: Diagnosis not present

## 2021-11-27 DIAGNOSIS — I10 Essential (primary) hypertension: Secondary | ICD-10-CM | POA: Diagnosis not present

## 2021-11-27 DIAGNOSIS — M25561 Pain in right knee: Secondary | ICD-10-CM | POA: Diagnosis not present

## 2021-11-27 DIAGNOSIS — M79641 Pain in right hand: Secondary | ICD-10-CM | POA: Diagnosis not present

## 2021-11-27 DIAGNOSIS — M25541 Pain in joints of right hand: Secondary | ICD-10-CM | POA: Diagnosis not present

## 2021-11-27 DIAGNOSIS — M25542 Pain in joints of left hand: Secondary | ICD-10-CM | POA: Diagnosis not present

## 2021-11-27 DIAGNOSIS — M1711 Unilateral primary osteoarthritis, right knee: Secondary | ICD-10-CM | POA: Diagnosis not present

## 2021-11-27 DIAGNOSIS — R2681 Unsteadiness on feet: Secondary | ICD-10-CM | POA: Diagnosis not present

## 2021-11-27 DIAGNOSIS — I5022 Chronic systolic (congestive) heart failure: Secondary | ICD-10-CM

## 2021-11-30 MED ORDER — POTASSIUM CHLORIDE CRYS ER 10 MEQ PO TBCR: 10.0000 meq | EXTENDED_RELEASE_TABLET | Freq: Every day | ORAL | 0 refills | Status: DC

## 2021-12-02 DIAGNOSIS — G4733 Obstructive sleep apnea (adult) (pediatric): Secondary | ICD-10-CM | POA: Diagnosis not present

## 2021-12-04 DIAGNOSIS — M059 Rheumatoid arthritis with rheumatoid factor, unspecified: Secondary | ICD-10-CM | POA: Diagnosis not present

## 2021-12-04 DIAGNOSIS — I10 Essential (primary) hypertension: Secondary | ICD-10-CM | POA: Diagnosis not present

## 2021-12-04 DIAGNOSIS — Z299 Encounter for prophylactic measures, unspecified: Secondary | ICD-10-CM | POA: Diagnosis not present

## 2021-12-04 DIAGNOSIS — M1711 Unilateral primary osteoarthritis, right knee: Secondary | ICD-10-CM | POA: Diagnosis not present

## 2021-12-07 ENCOUNTER — Other Ambulatory Visit: Payer: Self-pay | Admitting: Cardiology

## 2021-12-07 DIAGNOSIS — M059 Rheumatoid arthritis with rheumatoid factor, unspecified: Secondary | ICD-10-CM | POA: Diagnosis not present

## 2021-12-07 DIAGNOSIS — Z79899 Other long term (current) drug therapy: Secondary | ICD-10-CM

## 2021-12-07 DIAGNOSIS — Z111 Encounter for screening for respiratory tuberculosis: Secondary | ICD-10-CM | POA: Diagnosis not present

## 2021-12-07 DIAGNOSIS — I480 Paroxysmal atrial fibrillation: Secondary | ICD-10-CM

## 2021-12-07 NOTE — Telephone Encounter (Signed)
Pt needs a refill.

## 2021-12-09 ENCOUNTER — Other Ambulatory Visit: Payer: Self-pay | Admitting: Cardiology

## 2021-12-09 DIAGNOSIS — I5022 Chronic systolic (congestive) heart failure: Secondary | ICD-10-CM

## 2021-12-09 MED ORDER — METOPROLOL SUCCINATE ER 25 MG PO TB24: 25.0000 mg | ORAL_TABLET | Freq: Every morning | ORAL | 0 refills | Status: DC

## 2021-12-12 ENCOUNTER — Emergency Department (HOSPITAL_COMMUNITY): Payer: Medicare PPO

## 2021-12-12 ENCOUNTER — Other Ambulatory Visit: Payer: Self-pay

## 2021-12-12 ENCOUNTER — Inpatient Hospital Stay (HOSPITAL_COMMUNITY)
Admission: EM | Admit: 2021-12-12 | Discharge: 2021-12-14 | DRG: 303 | Disposition: A | Discharge status: Home / Self Care | Payer: Medicare PPO | Attending: Internal Medicine | Admitting: Internal Medicine

## 2021-12-12 DIAGNOSIS — R0789 Other chest pain: Secondary | ICD-10-CM | POA: Diagnosis not present

## 2021-12-12 DIAGNOSIS — G4730 Sleep apnea, unspecified: Secondary | ICD-10-CM | POA: Diagnosis present

## 2021-12-12 DIAGNOSIS — Z8249 Family history of ischemic heart disease and other diseases of the circulatory system: Secondary | ICD-10-CM

## 2021-12-12 DIAGNOSIS — Z7901 Long term (current) use of anticoagulants: Secondary | ICD-10-CM | POA: Diagnosis not present

## 2021-12-12 DIAGNOSIS — I255 Ischemic cardiomyopathy: Secondary | ICD-10-CM | POA: Diagnosis present

## 2021-12-12 DIAGNOSIS — K219 Gastro-esophageal reflux disease without esophagitis: Secondary | ICD-10-CM | POA: Diagnosis present

## 2021-12-12 DIAGNOSIS — I2510 Atherosclerotic heart disease of native coronary artery without angina pectoris: Secondary | ICD-10-CM | POA: Diagnosis present

## 2021-12-12 DIAGNOSIS — E119 Type 2 diabetes mellitus without complications: Secondary | ICD-10-CM | POA: Diagnosis present

## 2021-12-12 DIAGNOSIS — E118 Type 2 diabetes mellitus with unspecified complications: Secondary | ICD-10-CM

## 2021-12-12 DIAGNOSIS — I25118 Atherosclerotic heart disease of native coronary artery with other forms of angina pectoris: Principal | ICD-10-CM | POA: Diagnosis present

## 2021-12-12 DIAGNOSIS — Q219 Congenital malformation of cardiac septum, unspecified: Secondary | ICD-10-CM | POA: Diagnosis not present

## 2021-12-12 DIAGNOSIS — I5020 Unspecified systolic (congestive) heart failure: Secondary | ICD-10-CM | POA: Diagnosis not present

## 2021-12-12 DIAGNOSIS — E785 Hyperlipidemia, unspecified: Secondary | ICD-10-CM | POA: Diagnosis present

## 2021-12-12 DIAGNOSIS — Z87891 Personal history of nicotine dependence: Secondary | ICD-10-CM | POA: Diagnosis not present

## 2021-12-12 DIAGNOSIS — I5022 Chronic systolic (congestive) heart failure: Secondary | ICD-10-CM | POA: Diagnosis not present

## 2021-12-12 DIAGNOSIS — I208 Other forms of angina pectoris: Principal | ICD-10-CM | POA: Diagnosis present

## 2021-12-12 DIAGNOSIS — Z96652 Presence of left artificial knee joint: Secondary | ICD-10-CM | POA: Diagnosis present

## 2021-12-12 DIAGNOSIS — Z951 Presence of aortocoronary bypass graft: Secondary | ICD-10-CM

## 2021-12-12 DIAGNOSIS — Z91030 Bee allergy status: Secondary | ICD-10-CM | POA: Diagnosis not present

## 2021-12-12 DIAGNOSIS — Z85820 Personal history of malignant melanoma of skin: Secondary | ICD-10-CM | POA: Diagnosis not present

## 2021-12-12 DIAGNOSIS — I10 Essential (primary) hypertension: Secondary | ICD-10-CM | POA: Diagnosis not present

## 2021-12-12 DIAGNOSIS — R079 Chest pain, unspecified: Secondary | ICD-10-CM | POA: Diagnosis not present

## 2021-12-12 DIAGNOSIS — I5189 Other ill-defined heart diseases: Secondary | ICD-10-CM | POA: Diagnosis not present

## 2021-12-12 DIAGNOSIS — Z833 Family history of diabetes mellitus: Secondary | ICD-10-CM | POA: Diagnosis not present

## 2021-12-12 DIAGNOSIS — I480 Paroxysmal atrial fibrillation: Secondary | ICD-10-CM | POA: Diagnosis not present

## 2021-12-12 DIAGNOSIS — Z7982 Long term (current) use of aspirin: Secondary | ICD-10-CM | POA: Diagnosis not present

## 2021-12-12 DIAGNOSIS — Z79899 Other long term (current) drug therapy: Secondary | ICD-10-CM

## 2021-12-12 DIAGNOSIS — G4733 Obstructive sleep apnea (adult) (pediatric): Secondary | ICD-10-CM | POA: Diagnosis present

## 2021-12-12 DIAGNOSIS — Z794 Long term (current) use of insulin: Secondary | ICD-10-CM

## 2021-12-12 DIAGNOSIS — Z7984 Long term (current) use of oral hypoglycemic drugs: Secondary | ICD-10-CM | POA: Diagnosis not present

## 2021-12-12 DIAGNOSIS — I4891 Unspecified atrial fibrillation: Secondary | ICD-10-CM | POA: Diagnosis present

## 2021-12-12 DIAGNOSIS — E1165 Type 2 diabetes mellitus with hyperglycemia: Secondary | ICD-10-CM | POA: Diagnosis not present

## 2021-12-12 DIAGNOSIS — I110 Hypertensive heart disease with heart failure: Secondary | ICD-10-CM | POA: Diagnosis present

## 2021-12-12 DIAGNOSIS — E1159 Type 2 diabetes mellitus with other circulatory complications: Secondary | ICD-10-CM | POA: Diagnosis not present

## 2021-12-12 DIAGNOSIS — I25708 Atherosclerosis of coronary artery bypass graft(s), unspecified, with other forms of angina pectoris: Secondary | ICD-10-CM | POA: Diagnosis not present

## 2021-12-12 LAB — TROPONIN I (HIGH SENSITIVITY)
Troponin I (High Sensitivity): 6 ng/L (ref <18)
Troponin I (High Sensitivity): 6 ng/L (ref <18)

## 2021-12-12 LAB — CBC
HCT: 44 % (ref 39.0–52.0)
Hemoglobin: 14.5 g/dL (ref 13.0–17.0)
MCH: 30.2 pg (ref 26.0–34.0)
MCHC: 33 g/dL (ref 30.0–36.0)
MCV: 91.7 fL (ref 80.0–100.0)
Platelets: 210 10*3/uL (ref 150–400)
RBC: 4.8 MIL/uL (ref 4.22–5.81)
RDW: 15.1 % (ref 11.5–15.5)
WBC: 6.4 10*3/uL (ref 4.0–10.5)
nRBC: 0 % (ref 0.0–0.2)

## 2021-12-12 LAB — BASIC METABOLIC PANEL
Anion gap: 8 (ref 5–15)
BUN: 19 mg/dL (ref 8–23)
CO2: 22 mmol/L (ref 22–32)
Calcium: 9.3 mg/dL (ref 8.9–10.3)
Chloride: 106 mmol/L (ref 98–111)
Creatinine, Ser: 0.77 mg/dL (ref 0.61–1.24)
GFR, Estimated: >60 mL/min (ref >60)
Glucose, Bld: 221 mg/dL — ABNORMAL HIGH (ref 70–99)
Potassium: 3.9 mmol/L (ref 3.5–5.1)
Sodium: 136 mmol/L (ref 135–145)

## 2021-12-12 LAB — HEPATIC FUNCTION PANEL
ALT: 18 U/L (ref 0–44)
AST: 20 U/L (ref 15–41)
Albumin: 3.6 g/dL (ref 3.5–5.0)
Alkaline Phosphatase: 80 U/L (ref 38–126)
Bilirubin, Direct: 0.2 mg/dL (ref 0.0–0.2)
Indirect Bilirubin: 0.6 mg/dL (ref 0.3–0.9)
Total Bilirubin: 0.8 mg/dL (ref 0.3–1.2)
Total Protein: 6.6 g/dL (ref 6.5–8.1)

## 2021-12-12 NOTE — ED Triage Notes (Signed)
Cp that has been off/on. Open heart surgery Feb 26, 2021. Pt took nitro at 11am.

## 2021-12-12 NOTE — ED Notes (Signed)
Hospitalist at bedside 

## 2021-12-12 NOTE — H&P (Signed)
History and Physical    Patient: Derek Bond [facility] XLK:[id number] DOB: 1946/04/09 DOA: 12/12/2021 DOS: the patient was seen and examined on 12/12/2021 PCP: Glenda Chroman, MD  Patient coming from: Home  Chief Complaint:  Chief Complaint  Patient presents with   Chest Pain   HPI: Derek Bond is a 76 y.o. male with medical history significant of atrial fibrillation on Eliquis and amiodarone, coronary artery disease with four-vessel bypass in 02/26/2021, hypertension, type 2 diabetes, obstructive sleep apnea.  Patient seen due to intermittent chest pain that is described as a dull pressure in the substernal area of his chest without radiation.  His pain is increased with activity and improved with rest.  His cardiologist is Dr Rex Kras with [facility] cardiovascular.  He did take nitroglycerin earlier today with some improvement.  When he gets the chest pain, there is no nausea or diaphoresis.  Review of Systems: As mentioned in the history of present illness. All other systems reviewed and are negative. 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    Due to laceration at age 76    Hiatal hernia    History of melanoma    Obstructive sleep apnea    Type 2 diabetes mellitus ([place])    Past Surgical History:  Procedure Laterality Date   BIOPSY  12/21/2019   Procedure: BIOPSY;  Surgeon: Harvel Quale, MD;  [facility];  Service: Gastroenterology;;  random colon   CARDIOVERSION N/A 06/10/2021   Procedure: CARDIOVERSION;  Surgeon: Rex Kras, DO;  [facility];  Service: Cardiovascular;  Laterality: N/A;   CLIPPING OF ATRIAL APPENDAGE N/A 02/26/2021   Procedure: CLIPPING OF ATRIAL APPENDAGE USING ATRICURE CLIP SIZE 76;  Surgeon: Lajuana Matte, MD;   Location: [place];  Service: Open Heart Surgery;  Laterality: N/A;   COLONOSCOPY WITH PROPOFOL N/A 12/21/2019   Procedure: COLONOSCOPY WITH PROPOFOL;  Surgeon: Harvel Quale, MD;  [facility];  Service: Gastroenterology;  Laterality: N/A;  915   CORONARY ARTERY BYPASS GRAFT N/A 02/26/2021   Procedure: CORONARY ARTERY BYPASS GRAFTING (CABG) X4, ON PUMP, USING LEFT INTERNAL MAMMARY ARTERY AND RIGHT ENDOSCOPICALLY HARVESTED GREATER SAPHENOUS VEIN;  Surgeon: Lajuana Matte, MD;  Location: [place];  Service: Open Heart Surgery;  Laterality: N/A;   ENDOVEIN HARVEST OF GREATER SAPHENOUS VEIN Right 02/26/2021   Procedure: ENDOVEIN HARVEST OF GREATER SAPHENOUS VEIN;  Surgeon: Lajuana Matte, MD;  Location: [place];  Service: Open Heart Surgery;  Laterality: Right;   MAZE N/A 02/26/2021   Procedure: MAZE;  Surgeon: Lajuana Matte, MD;  Location: [place];  Service: Open Heart Surgery;  Laterality: N/A;   MELANOMA EXCISION     Left facial region   REPLACEMENT TOTAL KNEE Left    RIGHT/LEFT HEART CATH AND CORONARY ANGIOGRAPHY N/A 02/23/2021   Procedure: RIGHT/LEFT HEART CATH AND CORONARY ANGIOGRAPHY;  Surgeon: Nigel Mormon, MD;  Location: [place] [facility];  Service: Cardiovascular;  Laterality: N/A;   TEE WITHOUT CARDIOVERSION N/A 02/26/2021   Procedure: TRANSESOPHAGEAL ECHOCARDIOGRAM (TEE);  Surgeon: Lajuana Matte, MD;  Location: [place];  Service: Open Heart Surgery;  Laterality: N/A;   TEE WITHOUT CARDIOVERSION N/A 06/10/2021   Procedure: TRANSESOPHAGEAL ECHOCARDIOGRAM (TEE);  Surgeon: Terri Skains,  Sunit, DO;  [facility];  Service: Cardiovascular;  Laterality: N/A;   UMBILICAL HERNIA REPAIR     Social History:  reports that he quit smoking about 32 years ago. His smoking use included cigarettes. He has a 60.00 pack-year smoking history. He has never used smokeless tobacco. He reports that he does not currently use alcohol. He reports that he does not use  drugs.  Allergies  Allergen Reactions   Bee Venom Swelling    Family History  Problem Relation Age of Onset   Diabetes Mother        Died in a house fire   Lung cancer Father    Hypertension Sister    Hypertension Sister    Hypertension Brother    Diabetes Brother    Hypertension Brother    Hypertension Other    Diabetes Other    Rheum arthritis Other    Stroke Other     Prior to Admission medications   Medication Sig Start Date End Date Taking? Authorizing Provider  acetaminophen (TYLENOL) 500 MG tablet Take 1,000 mg by mouth every 6 (six) hours as needed for moderate pain or mild pain.   Yes [provider]  amiodarone (PACERONE) 200 MG tablet TAKE 1/2 TABLET EVERY DAY 12/10/21  Yes Tolia, Sunit, DO  apixaban (ELIQUIS) 5 MG TABS tablet Take 1 tablet (5 mg total) by mouth 2 (two) times daily. 09/22/21 03/21/22 Yes Tolia, Sunit, DO  aspirin EC 81 MG tablet Take 81 mg by mouth daily.   Yes [provider]  famotidine (PEPCID) 40 MG tablet Take 40 mg by mouth at bedtime. 04/13/21  Yes [provider]  FARXIGA 10 MG TABS tablet TAKE 1 TABLET EVERY DAY 09/22/21  Yes Tolia, Sunit, DO  glipiZIDE (GLUCOTROL XL) 10 MG 24 hr tablet Take 10 mg by mouth 2 (two) times daily.   Yes [provider]  Magnesium Oxide 400 MG CAPS Take 1 capsule (400 mg total) by mouth daily. 09/22/21 03/21/22 Yes Tolia, Sunit, DO  metFORMIN (GLUCOPHAGE) 1000 MG tablet Take 1,000 mg by mouth daily with breakfast.   Yes [provider]  methotrexate (RHEUMATREX) 2.5 MG tablet Take 2.5 mg by mouth as directed. 12/04/21  Yes [provider]  metoprolol succinate (TOPROL-XL) 25 MG 24 hr tablet Take 1 tablet (25 mg total) by mouth every morning. 12/09/21  Yes Tolia, Sunit, DO  nitroGLYCERIN (NITROSTAT) 0.4 MG SL tablet Place under the tongue. 06/24/21  Yes [provider]  pantoprazole (PROTONIX) 40 MG tablet Take 40 mg by mouth every evening.   Yes [provider]  potassium chloride (KLOR-CON M10) 10 MEQ tablet Take 1 tablet (10 mEq total) by mouth daily. 11/30/21  Yes Tolia, Sunit, DO  rosuvastatin (CRESTOR) 5 MG tablet Take 5 mg by mouth at bedtime.   Yes [provider]  sacubitril-valsartan (ENTRESTO) 97-103 MG Take 1 tablet by mouth 2 (two) times daily. 09/22/21 03/21/22 Yes Tolia, Sunit, DO  traMADol (ULTRAM) 50 MG tablet Take by mouth every 6 (six) hours as needed.   Yes [provider]  diclofenac (VOLTAREN) 75 MG EC tablet Take 75 mg by mouth 2 (two) times daily.    [provider]    Physical Exam: Vitals:   12/12/21 1900 12/12/21 1930 12/12/21 1959 12/12/21 2000  BP: 131/73 127/74  (!) 152/90  Pulse: 66 62  68  Resp: 14 16  (!) 22  Temp:   97.9 F (36.6 C)   TempSrc:  Oral   SpO2: 96% 96%  98%  Weight:      Height:       General: Elderly male. Awake and alert and oriented x3. No acute cardiopulmonary distress.  HEENT: Normocephalic atraumatic.  Right and left ears normal in appearance.  Pupils equal, round, reactive to light. Extraocular muscles are intact. Sclerae anicteric and noninjected.  Moist mucosal membranes. No mucosal lesions.  Neck: Neck supple without lymphadenopathy. No carotid bruits. No masses palpated.  Cardiovascular: Regular rate with normal S1-S2 sounds. No murmurs, rubs, gallops auscultated. No JVD.  Respiratory: Good respiratory effort with no wheezes, rales, rhonchi. Lungs clear to auscultation bilaterally.  No accessory muscle use. Abdomen: Soft, nontender, nondistended. Active bowel sounds. No masses or hepatosplenomegaly  Skin: No rashes, lesions, or ulcerations.  Dry, warm to touch. 2+ dorsalis pedis and radial pulses. Musculoskeletal: No calf or leg pain. All major joints not erythematous nontender.  No upper or lower joint deformation.  Good ROM.  No contractures  Psychiatric: Intact judgment and insight. Pleasant and cooperative. Neurologic: No focal neurological  deficits. Strength is 5/5 and symmetric in upper and lower extremities.  Cranial nerves II through XII are grossly intact.  Data Reviewed: Results for orders placed or performed during the hospital encounter of 12/12/21 (from the past 24 hour(s))  Basic metabolic panel     Status: Abnormal   Collection Time: 12/12/21  4:28 PM  Result Value Ref Range   Sodium 136 135 - 145 mmol/L   Potassium 3.9 3.5 - 5.1 mmol/L   Chloride 106 98 - 111 mmol/L   CO2 22 22 - 32 mmol/L   Glucose, Bld 221 (H) 70 - 99 mg/dL   BUN 19 8 - 23 mg/dL   Creatinine, Ser 0.77 0.61 - 1.24 mg/dL   Calcium 9.3 8.9 - 10.3 mg/dL   GFR, Estimated >60 >60 mL/min   Anion gap 8 5 - 15  CBC     Status: None   Collection Time: 12/12/21  4:28 PM  Result Value Ref Range   WBC 6.4 4.0 - 10.5 K/uL   RBC 4.80 4.22 - 5.81 MIL/uL   Hemoglobin 14.5 13.0 - 17.0 g/dL   HCT 44.0 39.0 - 52.0 %   MCV 91.7 80.0 - 100.0 fL   MCH 30.2 26.0 - 34.0 pg   MCHC 33.0 30.0 - 36.0 g/dL   RDW 15.1 11.5 - 15.5 %   Platelets 210 150 - 400 K/uL   nRBC 0.0 0.0 - 0.2 %  Troponin I (High Sensitivity)     Status: None   Collection Time: 12/12/21  4:28 PM  Result Value Ref Range   Troponin I (High Sensitivity) 6 <18 ng/L  Hepatic function panel     Status: None   Collection Time: 12/12/21  5:04 PM  Result Value Ref Range   Total Protein 6.6 6.5 - 8.1 g/dL   Albumin 3.6 3.5 - 5.0 g/dL   AST 20 15 - 41 U/L   ALT 18 0 - 44 U/L   Alkaline Phosphatase 80 38 - 126 U/L   Total Bilirubin 0.8 0.3 - 1.2 mg/dL   Bilirubin, Direct 0.2 0.0 - 0.2 mg/dL   Indirect Bilirubin 0.6 0.3 - 0.9 mg/dL  Troponin I (High Sensitivity)     Status: None   Collection Time: 12/12/21  6:20 PM  Result Value Ref Range   Troponin I (High Sensitivity) 6 <18 ng/L   DG Chest Port 1 View  Result Date: 12/12/2021 CLINICAL  DATA:  Intermittent chest pain EXAM: PORTABLE CHEST 1 VIEW COMPARISON:  June 03, 2021 FINDINGS: Sternotomy wires are intact. The heart, hila, mediastinum,  lungs, and pleura are unremarkable. IMPRESSION: No active disease. Electronically Signed   By: Dorise Bullion III M.D.   On: 12/12/2021 17:31    EKG personally read by me.  Normal sinus rhythm with right axis deviation.  Nonspecific intraventricular conduction block.  No acute ST changes.  Assessment and Plan: No notes have been filed under this hospital service. Service: Hospitalist  Principal Problem:   Stable angina ([place]) Active Problems:   Atrial fibrillation with RVR ([place])   Essential hypertension   Type 2 diabetes mellitus with complication, with long-term current use of insulin (H[facility])   HFrEF (heart failure with reduced ejection fraction) ([facility])   S/P CABG x 4   Coronary artery disease  Stable angina With patient's angina, will transfer to [facility] so that he can be further evaluated by cardiology. Repeat troponins every 6 hours. Paroxysmal atrial fibrillation Continue rhythm control with amiodarone and metoprolol. Continue Eliquis Diabetes We will hold metformin and glyburide Levemir and sliding scale insulin with CBGs before meals and nightly Heart failure with reduced EF Compensated Coronary artery disease status post four-vessel CABG   Advance Care Planning:   Code Status: Prior full code  Consults: Cardiology  Family Communication: None  Severity of Illness: The appropriate patient status for this patient is INPATIENT. Inpatient status is judged to be reasonable and necessary in order to provide the required intensity of service to ensure the patient's safety. The patient's presenting symptoms, physical exam findings, and initial radiographic and laboratory data in the context of their chronic comorbidities is felt to place them at high risk for further clinical deterioration. Furthermore, it is not anticipated that the patient will be medically stable for discharge from the hospital within 2 midnights of admission.   * I certify that at the point of  admission it is my clinical judgment that the patient will require inpatient hospital care spanning beyond 2 midnights from the point of admission due to high intensity of service, high risk for further deterioration and high frequency of surveillance required.*  Author: Truett Mainland, DO 12/12/2021 9:11 PM  For on call review [email or web address].

## 2021-12-13 ENCOUNTER — Inpatient Hospital Stay (HOSPITAL_COMMUNITY): Payer: Medicare PPO

## 2021-12-13 ENCOUNTER — Encounter (HOSPITAL_COMMUNITY): Payer: Self-pay | Admitting: Family Medicine

## 2021-12-13 DIAGNOSIS — I208 Other forms of angina pectoris: Secondary | ICD-10-CM | POA: Diagnosis not present

## 2021-12-13 LAB — BASIC METABOLIC PANEL
Anion gap: 6 (ref 5–15)
BUN: 15 mg/dL (ref 8–23)
CO2: 24 mmol/L (ref 22–32)
Calcium: 9.3 mg/dL (ref 8.9–10.3)
Chloride: 107 mmol/L (ref 98–111)
Creatinine, Ser: 0.69 mg/dL (ref 0.61–1.24)
GFR, Estimated: >60 mL/min (ref >60)
Glucose, Bld: 222 mg/dL — ABNORMAL HIGH (ref 70–99)
Potassium: 3.8 mmol/L (ref 3.5–5.1)
Sodium: 137 mmol/L (ref 135–145)

## 2021-12-13 LAB — ECHOCARDIOGRAM COMPLETE
AR max vel: 1.42 cm2
AV Area VTI: 1.34 cm2
AV Area mean vel: 1.48 cm2
AV Mean grad: 13 mmHg
AV Peak grad: 19.8 mmHg
Ao pk vel: 2.23 m/s
Area-P 1/2: 2.31 cm2
Calc EF: 36.3 %
Height: 78 in
S' Lateral: 3.4 cm
Single Plane A2C EF: 37.2 %
Single Plane A4C EF: 37.6 %
Weight: 2998.26 oz

## 2021-12-13 LAB — GLUCOSE, CAPILLARY
Glucose-Capillary: 157 mg/dL — ABNORMAL HIGH (ref 70–99)
Glucose-Capillary: 245 mg/dL — ABNORMAL HIGH (ref 70–99)
Glucose-Capillary: 188 mg/dL — ABNORMAL HIGH (ref 70–99)
Glucose-Capillary: 283 mg/dL — ABNORMAL HIGH (ref 70–99)

## 2021-12-13 LAB — CBC
HCT: 42.8 % (ref 39.0–52.0)
Hemoglobin: 14.3 g/dL (ref 13.0–17.0)
MCH: 30 pg (ref 26.0–34.0)
MCHC: 33.4 g/dL (ref 30.0–36.0)
MCV: 89.7 fL (ref 80.0–100.0)
Platelets: 202 10*3/uL (ref 150–400)
RBC: 4.77 MIL/uL (ref 4.22–5.81)
RDW: 15.1 % (ref 11.5–15.5)
WBC: 5.9 10*3/uL (ref 4.0–10.5)
nRBC: 0 % (ref 0.0–0.2)

## 2021-12-13 LAB — TSH: TSH: 2.403 u[IU]/mL (ref 0.350–4.500)

## 2021-12-13 LAB — MRSA NEXT GEN BY PCR, NASAL: MRSA by PCR Next Gen: NOT DETECTED

## 2021-12-13 LAB — HEMOGLOBIN A1C
Hgb A1c MFr Bld: 8.7 % — ABNORMAL HIGH (ref 4.8–5.6)
Mean Plasma Glucose: 202.99 mg/dL

## 2021-12-13 LAB — LIPID PANEL
Cholesterol: 120 mg/dL (ref 0–200)
HDL: 50 mg/dL (ref >40)
LDL Cholesterol: 58 mg/dL (ref 0–99)
Total CHOL/HDL Ratio: 2.4 RATIO
Triglycerides: 59 mg/dL (ref <150)
VLDL: 12 mg/dL (ref 0–40)

## 2021-12-13 LAB — LDL CHOLESTEROL, DIRECT: Direct LDL: 54 mg/dL (ref 0–99)

## 2021-12-13 LAB — TROPONIN I (HIGH SENSITIVITY)
Troponin I (High Sensitivity): 8 ng/L (ref <18)
Troponin I (High Sensitivity): 8 ng/L (ref <18)

## 2021-12-13 LAB — BRAIN NATRIURETIC PEPTIDE: B Natriuretic Peptide: 122.9 pg/mL — ABNORMAL HIGH (ref 0.0–100.0)

## 2021-12-13 MED ORDER — APIXABAN 5 MG PO TABS
5.0000 mg | ORAL_TABLET | Freq: Two times a day (BID) | ORAL | Status: DC
  Administered 2021-12-13 – 2021-12-14 (×3): 5 mg via ORAL
  Filled 2021-12-13 (×2): qty 1

## 2021-12-13 MED ORDER — DICLOFENAC SODIUM 75 MG PO TBEC
75.0000 mg | DELAYED_RELEASE_TABLET | Freq: Two times a day (BID) | ORAL | Status: DC
  Administered 2021-12-13 (×2): 75 mg via ORAL
  Filled 2021-12-13 (×3): qty 1

## 2021-12-13 MED ORDER — SACUBITRIL-VALSARTAN 97-103 MG PO TABS
1.0000 | ORAL_TABLET | Freq: Two times a day (BID) | ORAL | Status: DC
  Administered 2021-12-13 – 2021-12-14 (×3): 1 via ORAL
  Filled 2021-12-13 (×5): qty 1

## 2021-12-13 MED ORDER — INSULIN ASPART 100 UNIT/ML IJ SOLN
0.0000 [IU] | Freq: Three times a day (TID) | INTRAMUSCULAR | Status: DC
  Administered 2021-12-13: 3 [IU] via SUBCUTANEOUS
  Administered 2021-12-13: 5 [IU] via SUBCUTANEOUS
  Administered 2021-12-13 – 2021-12-14 (×3): 3 [IU] via SUBCUTANEOUS

## 2021-12-13 MED ORDER — ROSUVASTATIN CALCIUM 5 MG PO TABS
5.0000 mg | ORAL_TABLET | Freq: Every day | ORAL | Status: DC
  Administered 2021-12-13: 5 mg via ORAL
  Filled 2021-12-13: qty 1

## 2021-12-13 MED ORDER — METOPROLOL SUCCINATE ER 25 MG PO TB24
25.0000 mg | ORAL_TABLET | Freq: Every morning | ORAL | Status: DC
  Administered 2021-12-13 – 2021-12-14 (×2): 25 mg via ORAL
  Filled 2021-12-13: qty 1

## 2021-12-13 MED ORDER — RANOLAZINE ER 500 MG PO TB12
500.0000 mg | ORAL_TABLET | Freq: Two times a day (BID) | ORAL | Status: DC
  Administered 2021-12-13 – 2021-12-14 (×3): 500 mg via ORAL
  Filled 2021-12-13 (×2): qty 1

## 2021-12-13 MED ORDER — INSULIN DETEMIR 100 UNIT/ML ~~LOC~~ SOLN
20.0000 [IU] | Freq: Every day | SUBCUTANEOUS | Status: DC
  Filled 2021-12-13: qty 0.2

## 2021-12-13 MED ORDER — ORAL CARE MOUTH RINSE: 15.0000 mL | OROMUCOSAL | Status: DC | PRN

## 2021-12-13 MED ORDER — TRAMADOL HCL 50 MG PO TABS: 50.0000 mg | ORAL_TABLET | Freq: Four times a day (QID) | ORAL | Status: DC | PRN

## 2021-12-13 MED ORDER — FAMOTIDINE 20 MG PO TABS
40.0000 mg | ORAL_TABLET | Freq: Every day | ORAL | Status: DC
  Administered 2021-12-13: 40 mg via ORAL
  Filled 2021-12-13: qty 2

## 2021-12-13 MED ORDER — ASPIRIN 81 MG PO TBEC
81.0000 mg | DELAYED_RELEASE_TABLET | Freq: Every day | ORAL | Status: DC
  Administered 2021-12-13 – 2021-12-14 (×2): 81 mg via ORAL
  Filled 2021-12-13: qty 1

## 2021-12-13 MED ORDER — INSULIN ASPART 100 UNIT/ML IJ SOLN
0.0000 [IU] | Freq: Every day | INTRAMUSCULAR | Status: DC
  Administered 2021-12-13: 3 [IU] via SUBCUTANEOUS

## 2021-12-13 MED ORDER — AMIODARONE HCL 100 MG PO TABS
100.0000 mg | ORAL_TABLET | Freq: Every day | ORAL | Status: DC
Start: 2021-12-13 — End: 2021-12-14
  Administered 2021-12-13: 100 mg via ORAL

## 2021-12-13 MED ORDER — DAPAGLIFLOZIN PROPANEDIOL 10 MG PO TABS
10.0000 mg | ORAL_TABLET | Freq: Every day | ORAL | Status: DC
  Administered 2021-12-13 – 2021-12-14 (×2): 10 mg via ORAL
  Filled 2021-12-13: qty 1

## 2021-12-13 MED ORDER — PANTOPRAZOLE SODIUM 40 MG PO TBEC
40.0000 mg | DELAYED_RELEASE_TABLET | Freq: Every evening | ORAL | Status: DC
  Administered 2021-12-13: 40 mg via ORAL
  Filled 2021-12-13: qty 1

## 2021-12-13 MED ORDER — ONDANSETRON HCL 4 MG PO TABS: 4.0000 mg | ORAL_TABLET | Freq: Four times a day (QID) | ORAL | Status: DC | PRN

## 2021-12-13 MED ORDER — POTASSIUM CHLORIDE CRYS ER 10 MEQ PO TBCR
10.0000 meq | EXTENDED_RELEASE_TABLET | Freq: Every day | ORAL | Status: DC
  Administered 2021-12-13 – 2021-12-14 (×2): 10 meq via ORAL
  Filled 2021-12-13: qty 1

## 2021-12-13 MED ORDER — POLYETHYLENE GLYCOL 3350 17 G PO PACK
17.0000 g | PACK | Freq: Every day | ORAL | Status: DC | PRN
Start: 2021-12-13 — End: 2021-12-14

## 2021-12-13 MED ORDER — ONDANSETRON HCL 4 MG/2ML IJ SOLN: 4.0000 mg | Freq: Four times a day (QID) | INTRAMUSCULAR | Status: DC | PRN

## 2021-12-13 NOTE — Progress Notes (Signed)
Echocardiogram 2D Echocardiogram has been performed.  Oneal Deputy Berta Denson RDCS 12/13/2021, 3:18 PM

## 2021-12-13 NOTE — Consult Note (Signed)
CARDIOLOGY CONSULT NOTE  Patient ID: Derek Bond MRN: [id number] DOB/AGE: December 07, 1945 76 y.o.  Admit date: 12/12/2021 Attending physician: Lavina Hamman, MD Primary Physician:  Glenda Chroman, MD Outpatient Cardiologist: Rex Kras, DO, [facility] Inpatient Cardiologist: Rex Kras, DO, [facility]  Reason of consultation: Chest Pain Referring physician: Dr. Loma Boston  Chief complaint: chest pain   HPI:  Derek Bond is a 76 y.o. Caucasian male who presents with a chief complaint of "chest pain." His past medical history and cardiovascular risk factors include: Ischemic cardiomyopathy, chronic heart failure with reduced EF, CAD s/p CABG / Clipping of atrial appendage using 60m atrial clip, hypertension, diabetes mellitus with microalbuminuria, severe sleep apnea, erectile dysfunction, heartburn, former smoker, hyperlipidemia,  paroxysmal atrial fibrillation status post Maze procedure and DDCV (06/10/2021), advanced age.  Cardiology has been asked to evaluate the patient during his hospitalization for chest pain.  Patient states that he has been experiencing chest heaviness for the last 1 week, no change in intensity, frequency, or duration.  At times the discomfort is noticeable with walking but not always.  The symptoms do relief/improve after resting.  Patient states that similar symptoms happen when he overeats and may be attributed to his underlying heartburn/GERD.  He is currently on Pepcid and Protonix. Chest heaviness is 3-4 out of 10, lasting for 5 minutes or less, substernally located.    The pain is not as severe when compared to his anginal discomfort in the past leading to bypass surgery.  He tried 1 sublingual nitroglycerin tablet over the last 1 week which did help improve his symptoms.  Since the last office visit he has been diagnosed with sleep apnea and awaiting CPAP machine.  He was scheduled to have an echocardiogram to reevaluate LVEF later next week as outpatient as  well.  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    Due to laceration at age 76   Hiatal hernia    History of melanoma    Obstructive sleep apnea    [place] 2 [place]     PAST SURGICAL HISTORY: Past Surgical History:  Procedure Laterality Date   BIOPSY  12/21/2019   Procedure: BIOPSY;  Surgeon: CHarvel Quale MD;  [facility];  Service: Gastroenterology;;  random colon   CARDIOVERSION N/A 06/10/2021   Procedure: CARDIOVERSION;  Surgeon: TRex Kras DO;  [facility];  Service: Cardiovascular;  Laterality: N/A;   CLIPPING OF ATRIAL APPENDAGE N/A 02/26/2021   Procedure: CLIPPING OF ATRIAL APPENDAGE USING ATRICURE CLIP SIZE 478  Surgeon: LLajuana Matte MD;  [facility]  Service: Open Heart Surgery;  Laterality: N/A;   COLONOSCOPY WITH PROPOFOL N/A 12/21/2019   Procedure: COLONOSCOPY WITH PROPOFOL;  Surgeon: CHarvel Quale MD;  [facility];  Service: Gastroenterology;  Laterality: N/A;  915   CORONARY ARTERY BYPASS GRAFT N/A 02/26/2021   Procedure: CORONARY ARTERY BYPASS GRAFTING (CABG) X4, ON PUMP, USING LEFT INTERNAL MAMMARY ARTERY AND RIGHT ENDOSCOPICALLY HARVESTED GREATER SAPHENOUS VEIN;  Surgeon: LLajuana Matte MD;  [place]  Service: Open Heart Surgery;  Laterality: N/A;   ENDOVEIN HARVEST OF GREATER SAPHENOUS  VEIN Right 02/26/2021   Procedure: ENDOVEIN HARVEST OF GREATER SAPHENOUS VEIN;  Surgeon: Lajuana Matte, MD;  Location: [place];  Service: Open Heart Surgery;  Laterality: Right;   MAZE N/A 02/26/2021   Procedure: MAZE;  Surgeon: Lajuana Matte, MD;  Location: [place];  Service:  Open Heart Surgery;  Laterality: N/A;   MELANOMA EXCISION     Left facial region   REPLACEMENT TOTAL KNEE Left    RIGHT/LEFT HEART CATH AND CORONARY ANGIOGRAPHY N/A 02/23/2021   Procedure: RIGHT/LEFT HEART CATH AND CORONARY ANGIOGRAPHY;  Surgeon: Nigel Mormon, MD;  Location: [place] [facility];  Service: Cardiovascular;  Laterality: N/A;   TEE WITHOUT CARDIOVERSION N/A 02/26/2021   Procedure: TRANSESOPHAGEAL ECHOCARDIOGRAM (TEE);  Surgeon: Lajuana Matte, MD;  Location: [place];  Service: Open Heart Surgery;  Laterality: N/A;   TEE WITHOUT CARDIOVERSION N/A 06/10/2021   Procedure: TRANSESOPHAGEAL ECHOCARDIOGRAM (TEE);  Surgeon: Rex Kras, DO;  [facility];  Service: Cardiovascular;  Laterality: N/A;   UMBILICAL HERNIA REPAIR      FAMILY HISTORY: The patient's family history includes Diabetes in his brother, mother, and another family member; Hypertension in his brother, brother, sister, sister, and another family member; Lung cancer in his father; Rheum arthritis in an other family member; Stroke in an other family member.   SOCIAL HISTORY:  The patient  reports that he quit smoking about 32 years ago. His smoking use included cigarettes. He has a 60.00 pack-year smoking history. He has never used smokeless tobacco. He reports that he does not currently use alcohol. He reports that he does not use drugs.  MEDICATIONS: Current Outpatient Medications  Medication Instructions   acetaminophen (TYLENOL) 1,000 mg, Oral, Every 6 hours PRN   amiodarone (PACERONE) 100 mg, Oral, Daily   apixaban (ELIQUIS) 5 mg, Oral, 2 times daily   aspirin EC 81 mg, Oral, Daily   diclofenac (VOLTAREN) 75 mg, Oral, 2 times daily   famotidine (PEPCID) 40 mg, Oral, Daily at bedtime   FARXIGA 10 MG TABS tablet TAKE 1 TABLET EVERY DAY   glipiZIDE (GLUCOTROL XL) 10 mg, Oral, 2 times daily   Magnesium Oxide 400 mg, Oral, Daily   metFORMIN (GLUCOPHAGE) 1,000 mg, Oral, Daily with breakfast    methotrexate (RHEUMATREX) 2.5 mg, Oral, As directed   metoprolol succinate (TOPROL-XL) 25 mg, Oral, Every morning   nitroGLYCERIN (NITROSTAT) 0.4 MG SL tablet Sublingual   pantoprazole (PROTONIX) 40 mg, Oral, Every evening   potassium chloride (KLOR-CON M10) 10 MEQ tablet 10 mEq, Oral, Daily   rosuvastatin (CRESTOR) 5 mg, Oral, Daily at bedtime   sacubitril-valsartan (ENTRESTO) 97-103 MG 1 tablet, Oral, 2 times daily   traMADol (ULTRAM) 50 MG tablet Oral, Every 6 hours PRN    REVIEW OF SYSTEMS: Review of Systems  Cardiovascular:  Positive for chest pain (see heaviness) and dyspnea on exertion (chronic). Negative for claudication, irregular heartbeat, leg swelling, near-syncope, orthopnea, palpitations, paroxysmal nocturnal dyspnea and syncope.  Respiratory:  Negative for shortness of breath.   Hematologic/Lymphatic: Negative for bleeding problem.  Musculoskeletal:  Negative for muscle cramps and myalgias.  Gastrointestinal:  Positive for heartburn.  Neurological:  Negative for dizziness and light-headedness.    PHYSICAL EXAM:    12/13/2021   11:21 AM 12/13/2021    9:36 AM 12/13/2021    7:52 AM  Vitals with BMI  Systolic [phone]  Diastolic 80 90 76  Pulse 66 85 80     Intake/Output Summary (Last 24 hours) at  12/13/2021 1316 Last data filed at 12/13/2021 1259 Gross per 24 hour  Intake 298 ml  Output --  Net 298 ml    Net IO Since Admission: 298 mL [12/13/21 1316]  CONSTITUTIONAL: Well-developed and well-nourished. No acute distress.  SKIN: Skin is warm and dry. No rash noted. No cyanosis. No pallor. No jaundice HEAD: Normocephalic and atraumatic.  EYES: No scleral icterus, no xanthelasma MOUTH/THROAT: Moist oral membranes.  NECK: No JVD present. No thyromegaly noted. No carotid bruits  CHEST Normal respiratory effort. No intercostal retractions.  Sternotomy site is well-healed. LUNGS: Clear to auscultation bilaterally.  No stridor. No wheezes. No rales.  CARDIOVASCULAR:  Regular rate and rhythm, positive S1-S2, no murmurs rubs or gallops appreciated ABDOMINAL: Soft, nontender, nondistended, positive bowel sounds in all 4 quadrants,  EXTREMITIES: No pitting edema, warm to touch.  HEMATOLOGIC: No significant bruising NEUROLOGIC: Oriented to person, place, and time. Nonfocal. Normal muscle tone.  PSYCHIATRIC: Normal mood and affect. Normal behavior. Cooperative  RADIOLOGY: DG Chest Port 1 View  Result Date: 12/12/2021 CLINICAL DATA:  Intermittent chest pain EXAM: PORTABLE CHEST 1 VIEW COMPARISON:  June 03, 2021 FINDINGS: Sternotomy wires are intact. The heart, hila, mediastinum, lungs, and pleura are unremarkable. IMPRESSION: No active disease. Electronically Signed   By: Dorise Bullion III M.D.   On: 12/12/2021 17:31    LABORATORY DATA: Lab Results  Component Value Date   WBC 5.9 12/13/2021   HGB 14.3 12/13/2021   HCT 42.8 12/13/2021   MCV 89.7 12/13/2021   PLT 202 12/13/2021    Recent Labs  Lab 12/12/21 1704 12/13/21 0750  NA  --  137  K  --  3.8  CL  --  107  CO2  --  24  BUN  --  15  CREATININE  --  0.69  CALCIUM  --  9.3  PROT 6.6  --   BILITOT 0.8  --   ALKPHOS 80  --   ALT 18  --   AST 20  --   GLUCOSE  --  222*    Lipid Panel  Lab Results  Component Value Date   CHOL 99 02/22/2021   HDL 44 02/22/2021   LDLCALC 44 02/22/2021   TRIG 56 02/22/2021   CHOLHDL 2.3 02/22/2021    BNP (last 3 results) Recent Labs    03/31/21 2048 04/14/21 2319 12/13/21 0750  BNP 427.7* 573.6* 122.9*    HEMOGLOBIN A1C Lab Results  Component Value Date   HGBA1C 8.7 (H) 12/13/2021   MPG 202.99 12/13/2021    Cardiac Panel (last 3 results) Recent Labs    12/12/21 1820 12/13/21 0750 12/13/21 1035  TROPONINIHS '6 8 8     '$ TSH Recent Labs    04/15/21 0411 05/12/21 1346 10/05/21 1111  TSH 4.161 3.800 2.500     CARDIAC DATABASE: 02/26/2021: CABG x4 (LIMA to LAD, SVG to DIAGONAL, SVG to OM, SVG to PLB) + MAZE + 45 mm atrial  clip placement.   06/10/2021: TEE guided cardioversion for restoration of normal sinus rhythm with synchronized biphasic 200 J x 2   EKG: 01/26/2021: Atrial fibrillation with rapid ventricular rate 126 bpm, left axis deviation, left anterior fascicular block, left bundle branch block, consider old inferior infarct.   05/19/2021: Atrial fibrillation, 110 bpm, left axis, LBBB.   12/12/2021: NSR, 83 bpm, IVCD (likely LBBB), poor R wave progression, without underlying ischemia or injury pattern.  12/13/2021: Normal sinus rhythm, 73 bpm, poor R wave progression, without underlying ischemia or  injury pattern.   Echocardiogram: 02/05/2021: LVEF 25-30%, global hypokinesis with regionality LV cavity mildly dilated, moderate LVH, elevated LAP, moderate MR, moderate TR, mild pulmonary hypertension, RVSP 36 mmHg   02/25/2021:  1. Left ventricular ejection fraction, by estimation, is 30 to 35%. The left ventricle has moderately decreased function. The left ventricle demonstrates global hypokinesis. There is moderate left ventricular hypertrophy. Left ventricular diastolic function could not be evaluated.   2. Right ventricular systolic function is normal. The right ventricular size is mildly enlarged. There is normal pulmonary artery systolic pressure.   3. Left atrial size was severely dilated.   4. Right atrial size was dilated.   5. The mitral valve is normal in structure. Mild mitral valve regurgitation. No evidence of mitral stenosis.   6. The aortic valve is tricuspid. Aortic valve regurgitation is not visualized.   7. There is mild dilatation of the ascending aorta, measuring 40 mm.    Stress Testing: MPI 02/12/2016: No diagnostic ST segment changes to indicate ischemia. No significant arrhythmias. Small, mild intensity, partially reversible mid to basal inferior defect suggestive of variable soft tissue attenuation. No large ischemic zones are noted. This is a low risk study. Nuclear stress EF:  55%.   Heart Catheterization: Left and right heart catheterization 02/23/2021 LM: Normal LAD: Prox 80% stenosius at bifurcation with small to medium caliber D1 with 75% long prox disease. Mid 70% disease Ramus: Minimal luminal irregularities Lcx: OM1 with prox 80% stenosis RCA: 60-70% disease in PL branches   RA: 2 mmHg RV: 22/0 mmHg PA: 21/5 mmHg, mPAP 13 mmHg PCW: 8 mmHg   CO: 5.6 L/min CI: 2.5 L/min/m2     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%, diabetic patient CVTS consult for CABG  IMPRESSION & RECOMMENDATIONS: Derek Bond is a 76 y.o. Caucasian male whose past medical history and cardiovascular risk factors include: Ischemic cardiomyopathy, chronic heart failure with reduced EF, CAD s/p CABG / Clipping of atrial appendage using 27m atrial clip, hypertension, diabetes mellitus with microalbuminuria, severe sleep apnea, erectile dysfunction, heartburn, former smoker, hyperlipidemia,  paroxysmal atrial fibrillation status post Maze procedure and DDCV (06/10/2021), advanced age.  Impression:  Established coronary artery disease, status post CABG, presents with stable angina Ischemic cardiomyopathy Paroxysmal atrial fibrillation status post Maze procedure, clipping of the atrial appendage, atrial clip, status post cardioversion January 2023 Diabetes mellitus type 2 with microalbuminuria. Severe sleep apnea. Heartburn/GERD. Hyperlipidemia. Former smoker  Plan:  Established coronary artery disease, status post CABG, presents with stable angina Symptoms are suggestive of angina pectoris based on clinical trajectory I suspect this is stable angina. High sensitive troponins negative x3. EKG nonischemic. Chest x-ray not suggestive of volume overload. Clinically appears euvolemic. Currently denies anginal discomfort. Shared decision was to proceed with echocardiogram to evaluate for LVEF (was scheduled for outpatient echo later this week) and nuclear stress test to  evaluate for reversible ischemia given his anginal discomfort and recent bypass surgery. Continue telemetry. N.p.o. after midnight Antianginal tx: Toprol XL, Ranexa (will add '500mg'$  po bid).  Fasting lipid profile and direct LDL  Ischemic cardiomyopathy Compensated. Not in overt heart failure Currently on Entresto, Farxiga, Toprol-XL. Trend  blood pressures and lab work and if able we will add spironolactone. Echo pending  Paroxysmal atrial fibrillation Status post maze procedure, clipping of the left atrial appendage, atrial clip, status post cardioversion January 2023. Rate control: Metoprolol. Rhythm control amiodarone. Thromboembolic prophylaxis: Eliquis Does not endorse bleeding CHA2DS2-VASc SCORE is 5 which correlates to 6.7% risk of stroke  per year. Check TSH Chest x-ray within normal limits AST ALT within normal limits. We will consider transitioning to Multaq prior to discharge.  Hyperlipidemia: Continue rosuvastatin. Does not endorse myalgias. We will check fasting lipid profile and direct LDL.  We will defer management of his other chronic comorbid conditions to attending physician.  Unable to perform stress test today as the patient had breakfast including coffee.  N.p.o. after midnight.  Nuclear tech is aware of the pharmacological stress tomorrow morning.   Total encounter time 62 minutes. *Total Encounter Time as defined by the Centers for Medicare and Medicaid Services includes, in addition to the face-to-face time of a patient visit (documented in the note above) non-face-to-face time: obtaining and reviewing outside history, ordering and reviewing medications, tests or procedures, care coordination (communications with other health care professionals or caregivers) and documentation in the medical record.  Patient's questions and concerns were addressed to his satisfaction. He voices understanding of the instructions provided during this encounter.   This note  was created using a voice recognition software as a result there may be grammatical errors inadvertently enclosed that do not reflect the nature of this encounter. Every attempt is made to correct such errors.  Mechele Claude [facility]  Pager: [phone] Office: [phone] 12/13/2021, 1:16 PM

## 2021-12-13 NOTE — ED Provider Notes (Signed)
[facility] PROGRESSIVE CARE Provider Note   CSN: [id number] Arrival date & time: 12/12/21  1556     History  Chief Complaint  Patient presents with   Chest Pain    Derek Bond is a 76 y.o. male.  Patient with history of coronary disease and bypass surgery.  He states that he has been having chest pressure for last couple weeks that is been occurring while he exerts himself and he also gets shortness of breath.  When he rests he feels better  The history is provided by the patient and medical records. No language interpreter was used.  Chest Pain Pain location:  L chest Pain quality: aching   Pain radiates to:  Does not radiate Pain severity:  Mild Onset quality:  Sudden Timing:  Intermittent Progression:  Waxing and waning Chronicity:  Recurrent Context: not breathing   Associated symptoms: no abdominal pain, no back pain, no cough, no fatigue and no headache        Home Medications Prior to Admission medications   Medication Sig Start Date End Date Taking? Authorizing Provider  acetaminophen (TYLENOL) 500 MG tablet Take 1,000 mg by mouth every 6 (six) hours as needed for moderate pain or mild pain.   Yes [provider]  amiodarone (PACERONE) 200 MG tablet TAKE 1/2 TABLET EVERY DAY 12/10/21  Yes Tolia, Sunit, DO  apixaban (ELIQUIS) 5 MG TABS tablet Take 1 tablet (5 mg total) by mouth 2 (two) times daily. 09/22/21 03/21/22 Yes Tolia, Sunit, DO  aspirin EC 81 MG tablet Take 81 mg by mouth daily.   Yes [provider]  famotidine (PEPCID) 40 MG tablet Take 40 mg by mouth at bedtime. 04/13/21  Yes [provider]  FARXIGA 10 MG TABS tablet TAKE 1 TABLET EVERY DAY 09/22/21  Yes Tolia, Sunit, DO  glipiZIDE (GLUCOTROL XL) 10 MG 24 hr tablet Take 10 mg by mouth 2 (two) times daily.   Yes [provider]  Magnesium Oxide 400 MG CAPS Take 1 capsule (400 mg total) by mouth daily. 09/22/21 03/21/22 Yes Tolia, Sunit, DO  metFORMIN (GLUCOPHAGE)  1000 MG tablet Take 1,000 mg by mouth daily with breakfast.   Yes [provider]  methotrexate (RHEUMATREX) 2.5 MG tablet Take 2.5 mg by mouth as directed. 12/04/21  Yes [provider]  metoprolol succinate (TOPROL-XL) 25 MG 24 hr tablet Take 1 tablet (25 mg total) by mouth every morning. 12/09/21  Yes Tolia, Sunit, DO  nitroGLYCERIN (NITROSTAT) 0.4 MG SL tablet Place under the tongue. 06/24/21  Yes [provider]  pantoprazole (PROTONIX) 40 MG tablet Take 40 mg by mouth every evening.   Yes [provider]  potassium chloride (KLOR-CON M10) 10 MEQ tablet Take 1 tablet (10 mEq total) by mouth daily. 11/30/21  Yes Tolia, Sunit, DO  rosuvastatin (CRESTOR) 5 MG tablet Take 5 mg by mouth at bedtime.   Yes [provider]  sacubitril-valsartan (ENTRESTO) 97-103 MG Take 1 tablet by mouth 2 (two) times daily. 09/22/21 03/21/22 Yes Tolia, Sunit, DO  traMADol (ULTRAM) 50 MG tablet Take by mouth every 6 (six) hours as needed.   Yes [provider]  diclofenac (VOLTAREN) 75 MG EC tablet Take 75 mg by mouth 2 (two) times daily.    [provider]      Allergies    Bee venom    Review of Systems   Review of Systems  Constitutional:  Negative for appetite change and fatigue.  HENT:  Negative for congestion, ear discharge and sinus pressure.   Eyes:  Negative for discharge.  Respiratory:  Negative for cough.   Cardiovascular:  Positive for chest pain.  Gastrointestinal:  Negative for abdominal pain and diarrhea.  Genitourinary:  Negative for frequency and hematuria.  Musculoskeletal:  Negative for back pain.  Skin:  Negative for rash.  Neurological:  Negative for seizures and headaches.  Psychiatric/Behavioral:  Negative for hallucinations.     Physical Exam Updated Vital Signs BP 125/90   Pulse 85   Temp 97.9 F (36.6 C) (Oral)   Resp 16   Ht '6\' 6"'$  (1.981 m)   Wt 85 kg   SpO2 94%   BMI 21.66 kg/m  Physical Exam Vitals and nursing  note reviewed.  Constitutional:      Appearance: He is well-developed.  HENT:     Head: Normocephalic.     Nose: Nose normal.  Eyes:     General: No scleral icterus.    Conjunctiva/sclera: Conjunctivae normal.  Neck:     Thyroid: No thyromegaly.  Cardiovascular:     Rate and Rhythm: Normal rate and regular rhythm.     Heart sounds: No murmur heard.    No friction rub. No gallop.  Pulmonary:     Breath sounds: No stridor. No wheezing or rales.  Chest:     Chest wall: No tenderness.  Abdominal:     General: There is no distension.     Tenderness: There is no abdominal tenderness. There is no rebound.  Musculoskeletal:        General: Normal range of motion.     Cervical back: Neck supple.  Lymphadenopathy:     Cervical: No cervical adenopathy.  Skin:    Findings: No erythema or rash.  Neurological:     Mental Status: He is alert and oriented to person, place, and time.     Motor: No abnormal muscle tone.     Coordination: Coordination normal.  Psychiatric:        Behavior: Behavior normal.     ED Results / Procedures / Treatments   Labs (all labs ordered are listed, but only abnormal results are displayed) Labs Reviewed  BASIC METABOLIC PANEL - Abnormal; Notable for the following components:      Result Value   Glucose, Bld 221 (*)    All other components within normal limits  HEMOGLOBIN A1C - Abnormal; Notable for the following components:   Hgb A1c MFr Bld 8.7 (*)    All other components within normal limits  BASIC METABOLIC PANEL - Abnormal; Notable for the following components:   Glucose, Bld 222 (*)    All other components within normal limits  GLUCOSE, CAPILLARY - Abnormal; Notable for the following components:   Glucose-Capillary 157 (*)    All other components within normal limits  MRSA NEXT GEN BY PCR, NASAL  CBC  HEPATIC FUNCTION PANEL  CBC  BRAIN NATRIURETIC PEPTIDE  TSH  TROPONIN I (HIGH SENSITIVITY)  TROPONIN I (HIGH SENSITIVITY)  TROPONIN I  (HIGH SENSITIVITY)  TROPONIN I (HIGH SENSITIVITY)    EKG EKG Interpretation  Date/Time:  Saturday December 12 2021 16:06:39 EDT Ventricular Rate:  83 PR Interval:  194 QRS Duration: 146 QT Interval:  440 QTC Calculation: 517 R Axis:   212 Text Interpretation: Normal sinus rhythm Right superior axis deviation Non-specific intra-ventricular conduction block Abnormal ECG When compared with ECG of 10-Jun-2021 13:11, Premature ventricular complexes are no longer Present QRS axis Shifted left T wave  amplitude has increased in Inferior leads Confirmed by Milton Ferguson ([id number]) on 12/12/2021 4:54:22 PM  Radiology DG Chest Port 1 View  Result Date: 12/12/2021 CLINICAL DATA:  Intermittent chest pain EXAM: PORTABLE CHEST 1 VIEW COMPARISON:  June 03, 2021 FINDINGS: Sternotomy wires are intact. The heart, hila, mediastinum, lungs, and pleura are unremarkable. IMPRESSION: No active disease. Electronically Signed   By: Dorise Bullion III M.D.   On: 12/12/2021 17:31    Procedures Procedures    Medications Ordered in ED Medications  aspirin EC tablet 81 mg (81 mg Oral Given 12/13/21 0936)  diclofenac (VOLTAREN) EC tablet 75 mg (75 mg Oral Given 12/13/21 0936)  traMADol (ULTRAM) tablet 50 mg (has no administration in time range)  amiodarone (PACERONE) tablet 100 mg (100 mg Oral Given 12/13/21 0935)  metoprolol succinate (TOPROL-XL) 24 hr tablet 25 mg (25 mg Oral Given 12/13/21 0936)  rosuvastatin (CRESTOR) tablet 5 mg (has no administration in time range)  sacubitril-valsartan (ENTRESTO) 97-103 mg per tablet (1 tablet Oral Given 12/13/21 0936)  dapagliflozin propanediol (FARXIGA) tablet 10 mg (10 mg Oral Given 12/13/21 0936)  famotidine (PEPCID) tablet 40 mg (has no administration in time range)  pantoprazole (PROTONIX) EC tablet 40 mg (has no administration in time range)  apixaban (ELIQUIS) tablet 5 mg (5 mg Oral Given 12/13/21 0936)  potassium chloride (KLOR-CON M) CR tablet 10 mEq (10 mEq Oral Given  12/13/21 0935)  insulin aspart (novoLOG) injection 0-15 Units (3 Units Subcutaneous Given 12/13/21 0643)  insulin aspart (novoLOG) injection 0-5 Units (has no administration in time range)  polyethylene glycol (MIRALAX / GLYCOLAX) packet 17 g (has no administration in time range)  ondansetron (ZOFRAN) tablet 4 mg (has no administration in time range)    Or  ondansetron (ZOFRAN) injection 4 mg (has no administration in time range)  Oral care mouth rinse (has no administration in time range)    ED Course/ Medical Decision Making/ A&P                           Medical Decision Making Amount and/or Complexity of Data Reviewed Labs: ordered. Radiology: ordered.  Risk Decision regarding hospitalization.  This patient presents to the ED for concern of chest pain, this involves an extensive number of treatment options, and is a complaint that carries with it a high risk of complications and morbidity.  The differential diagnosis includes MI   Co morbidities that complicate the patient evaluation  Bypass surgery   Additional history obtained:  Additional history obtained from patient External records from outside source obtained and reviewed including hospital record   Lab Tests:  I Ordered, and personally interpreted labs.  The pertinent results include: Glucose 221, troponin x2 negative   Imaging Studies ordered:  I ordered imaging studies including chest X I independently visualized and interpreted imaging which showed negative I agree with the radiologist interpretation   Cardiac Monitoring: / EKG:  The patient was maintained on a cardiac monitor.  I personally viewed and interpreted the cardiac monitored which showed an underlying rhythm of: Normal sinus rhythm   Consultations Obtained:  I requested consultation with the hospitalist,  and discussed lab and imaging findings as well as pertinent plan - they recommend: Admit   Problem List / ED Course / Critical  interventions / Medication management  Chest pain No medicines were Reevaluation of the patient after these medicines showed that the patient improved I have reviewed the patients home medicines and  have made adjustments as needed   Social Determinants of Health:  None   Test / Admission - Considered:  None  Patient with chest pressure on exertion.  He will be admitted to medicine over [facility] and        Final Clinical Impression(s) / ED Diagnoses Final diagnoses:  None    Rx / DC Orders ED Discharge Orders     None         Milton Ferguson, MD 12/13/21 1026

## 2021-12-13 NOTE — Hospital Course (Signed)
PMH of CAD SP CABG, HTN, type II DM, OSA, PAF presented to the hospital with complaints of chest pain.  Cardiology consulted.  Currently plan for stress test on 7/31.

## 2021-12-13 NOTE — Progress Notes (Signed)
  Progress Note Patient: [facility] HOZ:[id number] DOB: 1945-08-30 DOA: 12/12/2021  DOS: the patient was seen and examined on 12/13/2021  Brief hospital course: PMH of CAD SP CABG, HTN, type II DM, OSA, PAF presented to the hospital with complaints of chest pain.  Cardiology consulted.  Currently plan for stress test on 7/31. Assessment and Plan: CAD SP CABG. Stable angina. Continues to have some chest pain right now concerning for stable angina. Troponins are negative x3. EKG shows no evidence of acute ischemia. Chest x-ray negative for any acute abnormality. Currently chest pain-free at the time of my evaluation. Echocardiogram performed shows preserved EF without any wall motion abnormality. Unfortunately had a coffee this morning therefore unable to perform stress test on 7/30. N.p.o. after midnight for possible stress test on 7/31. Continue Toprol, Ranexa. Management per cardiology.  Chronic HFrEF Does not appear to be any volume overload. Continue Entresto Farxiga and Toprol-XL.  PAF. SP maze procedure, left atrial appendage clipping, atrial clip and cardioversion in January 2023. Currently on amiodarone. Also on Lopressor. Anticoagulation with Eliquis continue. CHA2DS2-VASc score is 5.  Cardiology considering transition to [facility] prior to discharge.  HLD. Continue Crestor.  GERD. Continue PPI.  On methotrexate. Per medication reconciliation patient is on methotrexate but unable to find out the indication for this medication. For now we will hold monitor  Subjective: No nausea no vomiting or no fever no chills.  No chest pain right now.  Physical Exam: Vitals:   12/13/21 0752 12/13/21 0936 12/13/21 1121 12/13/21 1622  BP: 118/76 125/90 127/80 111/69  Pulse: 80 85 66 65  Resp: '16  15 14  '$ Temp: 97.9 F (36.6 C)  97.6 F (36.4 C) 97.6 F (36.4 C)  TempSrc: Oral  Oral Oral  SpO2: 94%  96% 94%  Weight:      Height:       General: Appear in mild distress;  no visible Abnormal Neck Mass Or lumps, Conjunctiva normal Cardiovascular: S1 and S2 Present, no Murmur, Respiratory: good respiratory effort, Bilateral Air entry present and CTA, no Crackles, no wheezes Abdomen: Bowel Sound present, Non tender  Extremities: no Pedal edema Neurology: alert and oriented to time, place, and person  Gait not checked due to patient safety concerns   Data Reviewed: I have Reviewed nursing notes, Vitals, and Lab results since pt's last encounter. Pertinent lab results CBC and BMP I have ordered test including CBC and BMP I have discussed pt's care plan and test results with cardiology.   Family Communication: None at bedside  Disposition: Status is: Inpatient Remains inpatient appropriate because: Stress test tomorrow.  May require further work-up pending stress test results.  Author: Berle Mull, MD 12/13/2021 6:59 PM  Please look on [email or web address] to find out who is on call.

## 2021-12-13 NOTE — Plan of Care (Signed)

## 2021-12-13 NOTE — Progress Notes (Signed)
Report given to  April RN at [facility]. Awaiting Research scientist (medical). Bryson Corona Edd Fabian

## 2021-12-14 ENCOUNTER — Other Ambulatory Visit (HOSPITAL_COMMUNITY): Payer: Self-pay

## 2021-12-14 ENCOUNTER — Inpatient Hospital Stay (HOSPITAL_COMMUNITY): Payer: Medicare PPO

## 2021-12-14 ENCOUNTER — Telehealth (HOSPITAL_COMMUNITY): Payer: Self-pay | Admitting: Pharmacy Technician

## 2021-12-14 DIAGNOSIS — E1165 Type 2 diabetes mellitus with hyperglycemia: Secondary | ICD-10-CM | POA: Diagnosis not present

## 2021-12-14 DIAGNOSIS — I10 Essential (primary) hypertension: Secondary | ICD-10-CM | POA: Diagnosis not present

## 2021-12-14 LAB — GLUCOSE, CAPILLARY
Glucose-Capillary: 171 mg/dL — ABNORMAL HIGH (ref 70–99)
Glucose-Capillary: 178 mg/dL — ABNORMAL HIGH (ref 70–99)

## 2021-12-14 MED ORDER — DRONEDARONE HCL 400 MG PO TABS
400.0000 mg | ORAL_TABLET | Freq: Two times a day (BID) | ORAL | 0 refills | Status: DC
  Filled 2021-12-14: qty 60, 30d supply, fill #0

## 2021-12-14 MED ORDER — TECHNETIUM TC 99M TETROFOSMIN IV KIT
10.8000 | PACK | Freq: Once | INTRAVENOUS | Status: AC | PRN
  Administered 2021-12-14: 10.8 via INTRAVENOUS

## 2021-12-14 MED ORDER — RANOLAZINE ER 500 MG PO TB12: 500.0000 mg | ORAL_TABLET | Freq: Two times a day (BID) | ORAL | 0 refills | Status: DC

## 2021-12-14 MED ORDER — REGADENOSON 0.4 MG/5ML IV SOLN
0.4000 mg | Freq: Once | INTRAVENOUS | Status: AC
  Administered 2021-12-14: 0.4 mg via INTRAVENOUS
  Filled 2021-12-14: qty 5

## 2021-12-14 MED ORDER — TECHNETIUM TC 99M TETROFOSMIN IV KIT
32.7000 | PACK | Freq: Once | INTRAVENOUS | Status: AC | PRN
  Administered 2021-12-14: 32.7 via INTRAVENOUS

## 2021-12-14 MED ORDER — DRONEDARONE HCL 400 MG PO TABS
400.0000 mg | ORAL_TABLET | Freq: Two times a day (BID) | ORAL | Status: DC
Start: 2021-12-14 — End: 2021-12-14
  Filled 2021-12-14: qty 1

## 2021-12-14 NOTE — Inpatient Diabetes Management (Signed)
Inpatient Diabetes Program Recommendations  AACE/ADA: New Consensus Statement on Inpatient Glycemic Control (2015)  Target Ranges:  Prepandial:   less than 140 mg/dL      Peak postprandial:   less than 180 mg/dL (1-2 hours)      Critically ill patients:  140 - 180 mg/dL   Lab Results  Component Value Date   GLUCAP 171 (H) 12/14/2021   HGBA1C 8.7 (H) 12/13/2021    Review of Glycemic Control  Latest Reference Range & Units 12/13/21 16:25 12/13/21 21:41 12/14/21 06:11  Glucose-Capillary 70 - 99 mg/dL 188 (H) 283 (H) 171 (H)  (H): Data is abnormally high Diabetes history: Type 2 Dm Outpatient Diabetes medications: Metformin 1000 mg BID, Glipizide 10 mg BID, Farxiga 10 mg QD Current orders for Inpatient glycemic control: Novolog 0-15 units TID & HS, Farxiga 10 mg QD  Inpatient Diabetes Program Recommendations:    If to remain inpatient, consider adding Semglee 10 units QD.  Thanks, Bronson Curb, MSN, RNC-OB Diabetes Coordinator [phone] (8a-5p)

## 2021-12-14 NOTE — Progress Notes (Signed)
RN went over d/c summary w/ pt and removed PIV. Awaiting TOC meds to be delivered prior to d/c.   1530 Belongings w/ pt, including TOC meds. Pt's family en route to transport pt home.  NT transporting pt to private vehicle

## 2021-12-14 NOTE — Telephone Encounter (Signed)
Pharmacy Patient Advocate Encounter  Insurance verification completed.    The patient is insured through [employer]   The patient is currently admitted and ran test claims for the following: Multaq '400mg'$  bid.  Copays and coinsurance results were relayed to Inpatient clinical team.

## 2021-12-14 NOTE — Progress Notes (Signed)
Progress Note  Patient Name: Derek Bond [facility] Date of Encounter: 12/14/2021  Attending physician: Lavina Hamman, MD Primary care provider: Glenda Chroman, MD Primary Cardiologist: Rex Kras, DO, [facility]  Subjective: Derek Bond is a 76 y.o. male who was seen and examined at stress lab He denies anginal discomfort or heart failure symptoms. Wishes to go home.  Objective: Vital Signs in the last 24 hours: Temp:  [97.6 F (36.4 C)-98.1 F (36.7 C)] 98.1 F (36.7 C) (07/31 0723) Pulse Rate:  [59-81] 81 (07/31 0316) Resp:  [12-20] 20 (07/31 0723) BP: (96-130)/(53-83) 118/70 (07/31 0905) SpO2:  [94 %-96 %] 94 % (07/30 1622)  Intake/Output:  Intake/Output Summary (Last 24 hours) at 12/14/2021 1024 Last data filed at 12/13/2021 1259 Gross per 24 hour  Intake 118 ml  Output --  Net 118 ml    Net IO Since Admission: 298 mL [12/14/21 1024]  Weights:  Filed Weights   12/12/21 1609 12/13/21 0248  Weight: 84.4 kg 85 kg    Telemetry: Personally reviewed. NSR  Physical examination: PHYSICAL EXAM: .Temp:  [97.6 F (36.4 C)-98.1 F (36.7 C)] 98.1 F (36.7 C) (07/31 0723) Pulse Rate:  [59-81] 81 (07/31 0316) Cardiac Rhythm: Normal sinus rhythm;Bundle branch block (07/31 0743) Resp:  [12-20] 20 (07/31 0723) BP: (96-130)/(53-83) 118/70 (07/31 0905) SpO2:  [94 %-96 %] 94 % (07/30 1622)  CONSTITUTIONAL: Well-developed and well-nourished. No acute distress.  SKIN: Skin is warm and dry. No rash noted. No cyanosis. No pallor. No jaundice HEAD: Normocephalic and atraumatic.  EYES: No scleral icterus, no xanthelasma MOUTH/THROAT: Moist oral membranes.  NECK: No JVD present. No thyromegaly noted. No carotid bruits  CHEST Normal respiratory effort. No intercostal retractions.  Sternotomy site is well-healed. LUNGS: Clear to auscultation bilaterally.  No stridor. No wheezes. No rales.  CARDIOVASCULAR: Regular rate and rhythm, positive S1-S2, no murmurs rubs or gallops  appreciated ABDOMINAL: Soft, nontender, nondistended, positive bowel sounds in all 4 quadrants,  EXTREMITIES: No pitting edema, warm to touch.  HEMATOLOGIC: No significant bruising NEUROLOGIC: Oriented to person, place, and time. Nonfocal. Normal muscle tone.  PSYCHIATRIC: Normal mood and affect. Normal behavior. Cooperative  No change in physical examination since yesterday.   Lab Results: Hematology Recent Labs  Lab 12/12/21 1628 12/13/21 0750  WBC 6.4 5.9  RBC 4.80 4.77  HGB 14.5 14.3  HCT 44.0 42.8  MCV 91.7 89.7  MCH 30.2 30.0  MCHC 33.0 33.4  RDW 15.1 15.1  PLT 210 202    Chemistry Recent Labs  Lab 12/12/21 1628 12/12/21 1704 12/13/21 0750  NA 136  --  137  K 3.9  --  3.8  CL 106  --  107  CO2 22  --  24  GLUCOSE 221*  --  222*  BUN 19  --  15  CREATININE 0.77  --  0.69  CALCIUM 9.3  --  9.3  PROT  --  6.6  --   ALBUMIN  --  3.6  --   AST  --  20  --   ALT  --  18  --   ALKPHOS  --  80  --   BILITOT  --  0.8  --   GFRNONAA >60  --  >60  ANIONGAP 8  --  6     Cardiac Enzymes: Cardiac Panel (last 3 results) Recent Labs    12/12/21 1820 12/13/21 0750 12/13/21 1035  TROPONINIHS '6 8 8    '$ BNP (last 3 results) Recent  Labs    03/31/21 2048 04/14/21 2319 12/13/21 0750  BNP 427.7* 573.6* 122.9*    ProBNP (last 3 results) Recent Labs    03/30/21 0957 05/12/21 1346 10/05/21 1111  PROBNP 1,722* 1,668* 455     DDimer No results for input(s): "DDIMER" in the last 168 hours.   Hemoglobin A1c:  Lab Results  Component Value Date   HGBA1C 8.7 (H) 12/13/2021   MPG 202.99 12/13/2021    TSH  Recent Labs    05/12/21 1346 10/05/21 1111 12/13/21 0750  TSH 3.800 2.500 2.403    Lipid Panel     Component Value Date/Time   CHOL 120 12/13/2021 1035   TRIG 59 12/13/2021 1035   HDL 50 12/13/2021 1035   CHOLHDL 2.4 12/13/2021 1035   VLDL 12 12/13/2021 1035   LDLCALC 58 12/13/2021 1035   LDLDIRECT 54 12/13/2021 1035     Imaging: ECHOCARDIOGRAM COMPLETE  Result Date: 12/13/2021    ECHOCARDIOGRAM REPORT   Patient Name:   Derek Bond Date of Exam: 12/13/2021 Medical Rec #:  [id number]         Height:       78.0 in Accession #:    [id number]        Weight:       187.4 lb Date of Birth:  Nov 08, 1945          BSA:          2.196 m Patient Age:    4 years          BP:           127/80 mmHg Patient Gender: M                 HR:           63 bpm. Exam Location:  Inpatient Procedure: 2D Echo, Color Doppler and Cardiac Doppler Indications:    R07.89 Other chest pain; B51.02 Chronic systolic (congestive)                 heart failure  History:        Patient has prior history of Echocardiogram examinations, most                 recent 06/10/2021. Prior CABG, Arrythmias:Atrial Fibrillation and                 LBBB; Risk Factors:Hypertension, Diabetes and Sleep Apnea.  Sonographer:    Raquel Sarna Senior RDCS Referring Phys: [id number] Rex Kras  Sonographer Comments: Suboptimal subcostal window and Technically difficult study due to poor echo windows. IMPRESSIONS  1. Left ventricular ejection fraction, by estimation, is 40 to 45%. The left ventricle has mildly decreased function. Left ventricular endocardial border not optimally defined to evaluate regional wall motion. There is moderate left ventricular hypertrophy. Left ventricular diastolic parameters are consistent with Grade I diastolic dysfunction (impaired relaxation).  2. Right ventricular systolic function reduced. The right ventricular size is normal.  3. The mitral valve is degenerative. Mild mitral valve regurgitation. No evidence of mitral stenosis.  4. The aortic valve is tricuspid. There is moderate calcification of the aortic valve. There is moderate thickening of the aortic valve. Aortic valve regurgitation is not visualized. Moderate aortic valve stenosis. Aortic valve area, by VTI measures 1.34 cm. Aortic valve mean gradient measures 13.0 mmHg. Aortic valve Vmax measures  2.23 m/s.  5. There is mild dilatation of the ascending aorta, measuring 42 mm.  6. The inferior vena cava is normal in  size with greater than 50% respiratory variability, suggesting right atrial pressure of 3 mmHg. FINDINGS  Left Ventricle: Left ventricular ejection fraction, by estimation, is 40 to 45%. The left ventricle has mildly decreased function. Left ventricular endocardial border not optimally defined to evaluate regional wall motion. The left ventricular internal cavity size was normal in size. There is moderate left ventricular hypertrophy. Left ventricular diastolic parameters are consistent with Grade I diastolic dysfunction (impaired relaxation). Right Ventricle: The right ventricular size is normal. No increase in right ventricular wall thickness. Right ventricular systolic function reduced. Left Atrium: Left atrial size was normal in size. Right Atrium: Right atrial size was normal in size. Pericardium: There is no evidence of pericardial effusion. Mitral Valve: The mitral valve is degenerative in appearance. There is mild thickening of the mitral valve leaflet(s). There is mild calcification of the mitral valve leaflet(s). Normal mobility of the mitral valve leaflets. Mild mitral annular calcification. Mild mitral valve regurgitation. No evidence of mitral valve stenosis. Tricuspid Valve: The tricuspid valve is grossly normal. Tricuspid valve regurgitation is trivial. No evidence of tricuspid stenosis. Aortic Valve: The aortic valve is tricuspid. There is moderate calcification of the aortic valve. There is moderate thickening of the aortic valve. There is mild aortic valve annular calcification. Aortic valve regurgitation is not visualized. Moderate aortic stenosis is present. Aortic valve mean gradient measures 13.0 mmHg. Aortic valve peak gradient measures 19.8 mmHg. Aortic valve area, by VTI measures 1.34 cm. Pulmonic Valve: The pulmonic valve was normal in structure. Pulmonic valve  regurgitation is not visualized. Aorta: The aortic root is normal in size and structure. There is mild dilatation of the ascending aorta, measuring 42 mm. Venous: The inferior vena cava is normal in size with greater than 50% respiratory variability, suggesting right atrial pressure of 3 mmHg. IAS/Shunts: The interatrial septum was not well visualized.  LEFT VENTRICLE PLAX 2D LVIDd:         4.60 cm      Diastology LVIDs:         3.40 cm      LV e' medial:    4.79 cm/s LV PW:         1.40 cm      LV E/e' medial:  12.4 LV IVS:        1.65 cm      LV e' lateral:   7.83 cm/s LVOT diam:     2.30 cm      LV E/e' lateral: 7.6 LV SV:         65 LV SV Index:   30 LVOT Area:     4.15 cm  LV Volumes (MOD) LV vol d, MOD A2C: 100.0 ml LV vol d, MOD A4C: 125.0 ml LV vol s, MOD A2C: 62.8 ml LV vol s, MOD A4C: 78.0 ml LV SV MOD A2C:     37.2 ml LV SV MOD A4C:     125.0 ml LV SV MOD BP:      41.0 ml RIGHT VENTRICLE RV S prime:     6.53 cm/s TAPSE (M-mode): 1.0 cm LEFT ATRIUM           Index        RIGHT ATRIUM           Index LA diam:      4.20 cm 1.91 cm/m   RA Area:     17.10 cm LA Vol (A2C): 84.5 ml 38.48 ml/m  RA Volume:   40.40 ml  18.40 ml/m  LA Vol (A4C): 55.2 ml 25.14 ml/m  AORTIC VALVE AV Area (Vmax):    1.42 cm AV Area (Vmean):   1.48 cm AV Area (VTI):     1.34 cm AV Vmax:           222.75 cm/s AV Vmean:          166.000 cm/s AV VTI:            0.484 m AV Peak Grad:      19.8 mmHg AV Mean Grad:      13.0 mmHg LVOT Vmax:         76.30 cm/s LVOT Vmean:        59.200 cm/s LVOT VTI:          0.156 m LVOT/AV VTI ratio: 0.32  AORTA Ao Root diam: 3.70 cm Ao Asc diam:  4.20 cm MITRAL VALVE MV Area (PHT): 2.31 cm    SHUNTS MV Decel Time: 329 msec    Systemic VTI:  0.16 m MV E velocity: 59.50 cm/s  Systemic Diam: 2.30 cm MV A velocity: 36.50 cm/s MV E/A ratio:  1.63 Krystalynn Ridgeway DO Electronically signed by Rex Kras DO Signature Date/Time: 12/13/2021/4:46:11 PM    Final    DG Chest Port 1 View  Result Date:  12/12/2021 CLINICAL DATA:  Intermittent chest pain EXAM: PORTABLE CHEST 1 VIEW COMPARISON:  June 03, 2021 FINDINGS: Sternotomy wires are intact. The heart, hila, mediastinum, lungs, and pleura are unremarkable. IMPRESSION: No active disease. Electronically Signed   By: Dorise Bullion III M.D.   On: 12/12/2021 17:31    CARDIAC DATABASE: 02/26/2021: CABG x4 (LIMA to LAD, SVG to DIAGONAL, SVG to OM, SVG to PLB) + MAZE + 45 mm atrial clip placement.   06/10/2021: TEE guided cardioversion- NSR with synchronized biphasic 200 J x 2   EKG: 01/26/2021: Atrial fibrillation with rapid ventricular rate 126 bpm, left axis deviation, left anterior fascicular block, left bundle branch block, consider old inferior infarct.   05/19/2021: Atrial fibrillation, 110 bpm, left axis, LBBB.   12/12/2021: NSR, 83 bpm, IVCD (likely LBBB), poor R wave progression, without underlying ischemia or injury pattern.   12/13/2021: Normal sinus rhythm, 73 bpm, poor R wave progression, without underlying ischemia or injury pattern.   Echocardiogram: 02/05/2021: LVEF 25-30%.  02/25/2021:  1. Left ventricular ejection fraction, by estimation, is 30 to 35%. The left ventricle has moderately decreased function. The left ventricle demonstrates global hypokinesis. There is moderate left ventricular hypertrophy. Left ventricular diastolic function could not be evaluated.   2. Right ventricular systolic function is normal. The right ventricular size is mildly enlarged. There is normal pulmonary artery systolic pressure.   3. Left atrial size was severely dilated.   4. Right atrial size was dilated.   5. The mitral valve is normal in structure. Mild mitral valve regurgitation. No evidence of mitral stenosis.   6. The aortic valve is tricuspid. Aortic valve regurgitation is not visualized.   7. There is mild dilatation of the ascending aorta, measuring 40 mm.   12/14/2021: Technically difficult study. LVEF 40-45% Moderate LVH Grade  1 diastolic impairment. Mild MR. Trileaflet aortic valve, moderate aortic stenosis (peak velocity 2.2 m/s, mean gradient 13 mmHg, AVA by VTI 1.34 cm, dimensional index 0.32, stroke-volume index 30).  Dilated ascending thoracic aorta 42 mm, aortic root 37 mm. Compared to prior study left and right atrium's are within normal limits.   Stress Testing: MPI 02/12/2016: No diagnostic ST segment changes to indicate  ischemia. No significant arrhythmias. Small, mild intensity, partially reversible mid to basal inferior defect suggestive of variable soft tissue attenuation. No large ischemic zones are noted. This is a low risk study. Nuclear stress EF: 55%.   Heart Catheterization: Left and right heart catheterization 02/23/2021 LM: Normal LAD: Prox 80% stenosius at bifurcation with small to medium caliber D1 with 75% long prox disease. Mid 70% disease Ramus: Minimal luminal irregularities Lcx: OM1 with prox 80% stenosis RCA: 60-70% disease in PL branches   RA: 2 mmHg RV: 22/0 mmHg PA: 21/5 mmHg, mPAP 13 mmHg PCW: 8 mmHg   CO: 5.6 L/min CI: 2.5 L/min/m2     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%, diabetic patient CVTS consult for CABG  Scheduled Meds:  apixaban  5 mg Oral BID   aspirin EC  81 mg Oral Daily   dapagliflozin propanediol  10 mg Oral Daily   dronedarone  400 mg Oral BID WC   famotidine  40 mg Oral QHS   insulin aspart  0-15 Units Subcutaneous TID WC   insulin aspart  0-5 Units Subcutaneous QHS   metoprolol succinate  25 mg Oral q morning   pantoprazole  40 mg Oral QPM   potassium chloride  10 mEq Oral Daily   ranolazine  500 mg Oral BID   rosuvastatin  5 mg Oral QHS   sacubitril-valsartan  1 tablet Oral BID    Continuous Infusions:   PRN Meds: ondansetron **OR** ondansetron (ZOFRAN) IV, mouth rinse, polyethylene glycol, traMADol   IMPRESSION & RECOMMENDATIONS: Derek Bond is a 76 y.o. Caucasian male whose past medical history and cardiac risk  factors include:  Ischemic cardiomyopathy, chronic systolic and diastolic heart failure, CAD s/p CABG / Clipping of atrial appendage using 38m atrial clip, aortic valve stenosis, dilated ascending thoracic aorta, hypertension, diabetes mellitus with microalbuminuria, severe sleep apnea, erectile dysfunction, heartburn, former smoker, hyperlipidemia,  paroxysmal atrial fibrillation status post Maze procedure and DDCV (06/10/2021), advanced age.  Impression: Established CAD, status post CABG, with stable angina: Resolved. Ischemic cardiomyopathy. Aortic stenosis, asymptomatic. Paroxysmal atrial fibrillation status post Maze procedure/clipping of the left atrial appendage/atrial clip/cardioversion in January 2023. Diabetes mellitus type 2 with microalbuminuria. Severe sleep apnea-pending CPAP. Heartburn/GERD. Hyperlipidemia. Former smoker  Recommendations: Patient is currently asymptomatic with regards to anginal discomfort.  His symptoms are very classic of stable angina.  However given his concerns that shared decision was to proceed with echo and myocardial perfusion imaging during this hospitalization.  Echocardiogram notes improvement in LVEF when compared to the past however, unable to comment on regional wall motion abnormalities due to it being a technically difficult study.  MPI results are pending.  No anginal discomfort since yesterday.  Antianginal medications include Toprol-XL, Ranexa, and nitro tablets as needed.  From a ischemic cardiomyopathy standpoint patient is on appropriate GDMT.  With regards to history of paroxysmal atrial fibrillation continue metoprolol for rate control, oral anticoagulation for thromboembolic prophylaxis, and we will transition him from amiodarone to Multaq.  TSH is within normal limits, no recent heart failure exacerbation.  Recommend up titration of medical therapy given his underlying heartburn/GERD.  He may benefit from GI evaluation as  outpatient.  Will defer management to attending physician.  Further recommendations to follow up on the results of the MPI.  Patient's questions and concerns were addressed to his satisfaction. He voices understanding of the instructions provided during this encounter.   This note was created using a voice recognition software as a result there may be  grammatical errors inadvertently enclosed that do not reflect the nature of this encounter. Every attempt is made to correct such errors.  Total time spent: 25 minutes  Mechele Claude [facility]  Pager: [phone] Office: [phone] 12/14/2021, 10:24 AM    ADDENDUM: 12:30 PM  Myocardial perfusion imaging: 12/14/2021 1. No reversible perfusion defects identified.   Small in size, mild in severity fixed defect is noted involving the basal inferior and mid inferior segments compatible with infarction involving the RCA.  Medium in size, mild in severity fixed defect involves the apical septal, apical inferior and apicolateral and apical segments. This is compatible with infarct involving the LAD.   2. Moderate apical and inferior hypokinesis with paradoxical motion of the septum. No left ventricular dilatation.   3. Left ventricular ejection fraction 40%, decreased from 55% on the previous exam.   4. Non invasive risk stratification*: Intermediate  Spoke to the patient over the phone.  He is made aware of the results of the nuclear stress test. No reversible myocardial perfusion defects. Currently asymptomatic on current antianginal therapy.  Patient can be discharged home from cardiovascular standpoint with follow-up with cardiology in 4 weeks..  Recommendations also conveyed to nursing staff as well as attending physician.  Rex Kras, [place], [employer]  Pager: [phone] Office: [phone]

## 2021-12-14 NOTE — TOC Benefit Eligibility Note (Signed)
Patient Research scientist (life sciences) completed.     The patient is currently admitted and upon discharge could be taking Multaq '400mg'$ .   The current 30 day co-pay is, $40.68.   The patient is insured through [employer].

## 2021-12-16 DIAGNOSIS — E119 Type 2 diabetes mellitus without complications: Secondary | ICD-10-CM | POA: Diagnosis not present

## 2021-12-16 DIAGNOSIS — I10 Essential (primary) hypertension: Secondary | ICD-10-CM | POA: Diagnosis not present

## 2021-12-16 DIAGNOSIS — Z299 Encounter for prophylactic measures, unspecified: Secondary | ICD-10-CM | POA: Diagnosis not present

## 2021-12-16 DIAGNOSIS — Z09 Encounter for follow-up examination after completed treatment for conditions other than malignant neoplasm: Secondary | ICD-10-CM | POA: Diagnosis not present

## 2021-12-16 DIAGNOSIS — R350 Frequency of micturition: Secondary | ICD-10-CM | POA: Diagnosis not present

## 2021-12-16 DIAGNOSIS — E1165 Type 2 diabetes mellitus with hyperglycemia: Secondary | ICD-10-CM | POA: Diagnosis not present

## 2021-12-16 DIAGNOSIS — I25119 Atherosclerotic heart disease of native coronary artery with unspecified angina pectoris: Secondary | ICD-10-CM | POA: Diagnosis not present

## 2021-12-17 ENCOUNTER — Ambulatory Visit (HOSPITAL_COMMUNITY): Payer: Medicare PPO

## 2021-12-17 NOTE — Discharge Summary (Signed)
Physician Discharge Summary   Patient: Derek Bond [address] [facility] MRN: [id number] DOB: 76/02/1946  Admit date:     12/12/2021  Discharge date: 12/14/2021  Discharge Physician: Berle Mull  PCP: Glenda Chroman, MD  Recommendations at discharge: Follow-up with cardiology as recommended  Discharge Diagnoses: Principal Problem:   Stable angina ([place]) Active Problems:   Atrial fibrillation with RVR ([place])   Essential hypertension   Type 2 diabetes mellitus with complication, with long-term current use of insulin (HCC)   HFrEF (heart failure with reduced ejection fraction) (HCC)   S/P CABG x 4   Coronary artery disease   Hospital Course: PMH of CAD SP CABG, HTN, type II DM, OSA, PAF presented to the hospital with complaints of chest pain.  Cardiology consulted.  Underwent stress test on 7/31 which was negative for reversible ischemia  Assessment and Plan: CAD SP CABG. Stable angina. Continues to have some chest pain right now concerning for stable angina. Troponins are negative x3. EKG shows no evidence of acute ischemia. Chest x-ray negative for any acute abnormality. Currently chest pain-free at the time of my evaluation. Echocardiogram performed shows preserved EF without any wall motion abnormality. Underwent stress test which was negative for any large reversible ischemia. Continue Toprol, Ranexa. Management per cardiology.   Chronic HFrEF Does not appear to be any volume overload. Continue Entresto Farxiga and Toprol-XL.  PAF. SP maze procedure, left atrial appendage clipping, atrial clip and cardioversion in January 2023. Currently on amiodarone.  Stopped on discharge Also on Lopressor. Anticoagulation with Eliquis continue. CHA2DS2-VASc score is 5.  Cardiology initiated Multaq prior to discharge.   HLD. Continue Crestor.  GERD. Continue PPI.   On methotrexate. Per medication reconciliation patient is on methotrexate but unable to find out the indication for this  medication.  Recommend to follow-up with PCP for further verification.  Consultants: Cardiology Procedures performed:  Nuclear medicine stress test DISCHARGE MEDICATION: Allergies as of 12/14/2021       Reactions   Bee Venom Swelling        Medication List     STOP taking these medications    amiodarone 200 MG tablet Commonly known as: PACERONE   diclofenac 75 MG EC tablet Commonly known as: VOLTAREN       TAKE these medications    acetaminophen 500 MG tablet Commonly known as: TYLENOL Take 1,000 mg by mouth every 6 (six) hours as needed for moderate pain or mild pain.   apixaban 5 MG Tabs tablet Commonly known as: ELIQUIS Take 1 tablet (5 mg total) by mouth 2 (two) times daily.   aspirin EC 81 MG tablet Take 81 mg by mouth daily.   Entresto 97-103 MG Generic drug: sacubitril-valsartan Take 1 tablet by mouth 2 (two) times daily.   famotidine 40 MG tablet Commonly known as: PEPCID Take 40 mg by mouth at bedtime.   Farxiga 10 MG Tabs tablet Generic drug: dapagliflozin propanediol TAKE 1 TABLET EVERY DAY   glipiZIDE 10 MG 24 hr tablet Commonly known as: GLUCOTROL XL Take 10 mg by mouth 2 (two) times daily.   Magnesium Oxide 400 MG Caps Take 1 capsule (400 mg total) by mouth daily.   metFORMIN 1000 MG tablet Commonly known as: GLUCOPHAGE Take 1,000 mg by mouth daily with breakfast.   methotrexate 2.5 MG tablet Commonly known as: RHEUMATREX Take 2.5 mg by mouth as directed.   metoprolol succinate 25 MG 24 hr tablet Commonly known as: TOPROL-XL Take 1 tablet (25 mg total) by mouth  every morning.   Multaq 400 MG tablet Generic drug: dronedarone Take 1 tablet (400 mg total) by mouth 2 (two) times daily with a meal.   nitroGLYCERIN 0.4 MG SL tablet Commonly known as: NITROSTAT Place under the tongue.   pantoprazole 40 MG tablet Commonly known as: PROTONIX Take 40 mg by mouth every evening.   potassium chloride 10 MEQ tablet Commonly known as:  Klor-Con M10 Take 1 tablet (10 mEq total) by mouth daily.   ranolazine 500 MG 12 hr tablet Commonly known as: RANEXA Take 1 tablet (500 mg total) by mouth 2 (two) times daily.   rosuvastatin 5 MG tablet Commonly known as: CRESTOR Take 5 mg by mouth at bedtime.   traMADol 50 MG tablet Commonly known as: ULTRAM Take by mouth every 6 (six) hours as needed.        Follow-up Information     Vyas, Dhruv B, MD. Schedule an appointment as soon as possible for a visit in 1 week(s).   Specialty: Internal Medicine Contact information: [place] [address] [phone]         [place], Sunit, DO. Schedule an appointment as soon as possible for a visit on 12/24/2021.   Specialties: Cardiology, Vascular Surgery Why: 230pm Contact information: [place] [address] [phone]                Disposition: Home Diet recommendation: Cardiac diet  Discharge Exam: Vitals:   12/14/21 0905 12/14/21 1026 12/14/21 1052 12/14/21 1153  BP: 118/70 (!) 111/94  107/88  Pulse:  77 77 79  Resp:  20  20  Temp:  97.9 F (36.6 C)  98 F (36.7 C)  TempSrc:  Oral  Oral  SpO2:  97%  93%  Weight:      Height:       General: Appear in no distress; no visible Abnormal Neck Mass Or lumps, Conjunctiva normal Cardiovascular: S1 and S2 Present, no Murmur, Respiratory: good respiratory effort, Bilateral Air entry present and CTA, no Crackles, no wheezes Abdomen: Bowel Sound present, Non tender  Extremities: no Pedal edema Neurology: alert and oriented to time, place, and person  Gait not checked due to patient safety concerns Filed Weights   12/12/21 1609 12/13/21 0248  Weight: 84.4 kg 85 kg   Condition at discharge: stable  The results of significant diagnostics from this hospitalization (including imaging, microbiology, ancillary and laboratory) are listed below for reference.   Imaging Studies: NM Myocar Multi W/Spect W/Wall Motion /  EF  Result Date: 12/14/2021 CLINICAL DATA:  Chest pain. EXAM: MYOCARDIAL IMAGING WITH SPECT (REST AND PHARMACOLOGIC-STRESS) GATED LEFT VENTRICULAR WALL MOTION STUDY LEFT VENTRICULAR EJECTION FRACTION TECHNIQUE: Standard myocardial SPECT imaging was performed after resting intravenous injection of 10 mCi Tc-40mtetrofosmin. Subsequently, intravenous infusion of Lexiscan was performed under the supervision of the Cardiology staff. At peak effect of the drug, 30 mCi Tc-922metrofosmin was injected intravenously and standard myocardial SPECT imaging was performed. Quantitative gated imaging was also performed to evaluate left ventricular wall motion, and estimate left ventricular ejection fraction. COMPARISON:  02/12/2016 FINDINGS: Perfusion: Small in size mild in severity fixed defect is noted involving the basal inferior and mid inferior segments. Medium in size mild in severity fixed defect involves the apical septal, apical inferior and apicolateral and apical segments. No reversible perfusion defects identified. Wall Motion: Normal wall motion involving the anterior and lateral wall. There is moderate apical and inferior hypokinesis with paradoxical motion  of the septum. Left Ventricular Ejection Fraction: 40 %, on the previous exam the ejection fraction was 55%. End diastolic volume 91 ml End systolic volume 55 ml IMPRESSION: 1. No reversible perfusion defects identified. Small in size, mild in severity fixed defect is noted involving the basal inferior and mid inferior segments compatible with infarction involving the RCA. Medium in size, mild in severity fixed defect involves the apical septal, apical inferior and apicolateral and apical segments. This is compatible with infarct involving the LAD. 2. Moderate apical and inferior hypokinesis with paradoxical motion of the septum. No left ventricular dilatation. 3. Left ventricular ejection fraction 40%, decreased from 55% on the previous exam. 4. Non invasive  risk stratification*: Intermediate *2012 Appropriate Use Criteria for Coronary Revascularization Focused Update: J Am Coll Cardiol. 3762;83(1):517-616. http://content.airportbarriers.com.aspx?articleid=1201161 Electronically Signed   By: Kerby Moors M.D.   On: 12/14/2021 10:36   ECHOCARDIOGRAM COMPLETE  Result Date: 12/13/2021    ECHOCARDIOGRAM REPORT   Patient Name:   [facility] RAY Kraemer Date of Exam: 12/13/2021 Medical Rec #:  [id number]         Height:       78.0 in Accession #:    [id number]        Weight:       187.4 lb Date of Birth:  09-Sep-1945          BSA:          2.196 m Patient Age:    76 years          BP:           127/80 mmHg Patient Gender: M                 HR:           63 bpm. Exam Location:  Inpatient Procedure: 2D Echo, Color Doppler and Cardiac Doppler Indications:    R07.89 Other chest pain; J00.93 Chronic systolic (congestive)                 heart failure  History:        Patient has prior history of Echocardiogram examinations, most                 recent 06/10/2021. Prior CABG, Arrythmias:Atrial Fibrillation and                 LBBB; Risk Factors:Hypertension, Diabetes and Sleep Apnea.  Sonographer:    Raquel Sarna Senior RDCS Referring Phys: [id number] Rex Kras  Sonographer Comments: Suboptimal subcostal window and Technically difficult study due to poor echo windows. IMPRESSIONS  1. Left ventricular ejection fraction, by estimation, is 40 to 45%. The left ventricle has mildly decreased function. Left ventricular endocardial border not optimally defined to evaluate regional wall motion. There is moderate left ventricular hypertrophy. Left ventricular diastolic parameters are consistent with Grade I diastolic dysfunction (impaired relaxation).  2. Right ventricular systolic function reduced. The right ventricular size is normal.  3. The mitral valve is degenerative. Mild mitral valve regurgitation. No evidence of mitral stenosis.  4. The aortic valve is tricuspid. There is moderate  calcification of the aortic valve. There is moderate thickening of the aortic valve. Aortic valve regurgitation is not visualized. Moderate aortic valve stenosis. Aortic valve area, by VTI measures 1.34 cm. Aortic valve mean gradient measures 13.0 mmHg. Aortic valve Vmax measures 2.23 m/s.  5. There is mild dilatation of the ascending aorta, measuring 42 mm.  6. The inferior vena cava is normal in size with  greater than 50% respiratory variability, suggesting right atrial pressure of 3 mmHg. FINDINGS  Left Ventricle: Left ventricular ejection fraction, by estimation, is 40 to 45%. The left ventricle has mildly decreased function. Left ventricular endocardial border not optimally defined to evaluate regional wall motion. The left ventricular internal cavity size was normal in size. There is moderate left ventricular hypertrophy. Left ventricular diastolic parameters are consistent with Grade I diastolic dysfunction (impaired relaxation). Right Ventricle: The right ventricular size is normal. No increase in right ventricular wall thickness. Right ventricular systolic function reduced. Left Atrium: Left atrial size was normal in size. Right Atrium: Right atrial size was normal in size. Pericardium: There is no evidence of pericardial effusion. Mitral Valve: The mitral valve is degenerative in appearance. There is mild thickening of the mitral valve leaflet(s). There is mild calcification of the mitral valve leaflet(s). Normal mobility of the mitral valve leaflets. Mild mitral annular calcification. Mild mitral valve regurgitation. No evidence of mitral valve stenosis. Tricuspid Valve: The tricuspid valve is grossly normal. Tricuspid valve regurgitation is trivial. No evidence of tricuspid stenosis. Aortic Valve: The aortic valve is tricuspid. There is moderate calcification of the aortic valve. There is moderate thickening of the aortic valve. There is mild aortic valve annular calcification. Aortic valve  regurgitation is not visualized. Moderate aortic stenosis is present. Aortic valve mean gradient measures 13.0 mmHg. Aortic valve peak gradient measures 19.8 mmHg. Aortic valve area, by VTI measures 1.34 cm. Pulmonic Valve: The pulmonic valve was normal in structure. Pulmonic valve regurgitation is not visualized. Aorta: The aortic root is normal in size and structure. There is mild dilatation of the ascending aorta, measuring 42 mm. Venous: The inferior vena cava is normal in size with greater than 50% respiratory variability, suggesting right atrial pressure of 3 mmHg. IAS/Shunts: The interatrial septum was not well visualized.  LEFT VENTRICLE PLAX 2D LVIDd:         4.60 cm      Diastology LVIDs:         3.40 cm      LV e' medial:    4.79 cm/s LV PW:         1.40 cm      LV E/e' medial:  12.4 LV IVS:        1.65 cm      LV e' lateral:   7.83 cm/s LVOT diam:     2.30 cm      LV E/e' lateral: 7.6 LV SV:         65 LV SV Index:   30 LVOT Area:     4.15 cm  LV Volumes (MOD) LV vol d, MOD A2C: 100.0 ml LV vol d, MOD A4C: 125.0 ml LV vol s, MOD A2C: 62.8 ml LV vol s, MOD A4C: 78.0 ml LV SV MOD A2C:     37.2 ml LV SV MOD A4C:     125.0 ml LV SV MOD BP:      41.0 ml RIGHT VENTRICLE RV S prime:     6.53 cm/s TAPSE (M-mode): 1.0 cm LEFT ATRIUM           Index        RIGHT ATRIUM           Index LA diam:      4.20 cm 1.91 cm/m   RA Area:     17.10 cm LA Vol (A2C): 84.5 ml 38.48 ml/m  RA Volume:   40.40 ml  18.40 ml/m LA Vol (  A4C): 55.2 ml 25.14 ml/m  AORTIC VALVE AV Area (Vmax):    1.42 cm AV Area (Vmean):   1.48 cm AV Area (VTI):     1.34 cm AV Vmax:           222.75 cm/s AV Vmean:          166.000 cm/s AV VTI:            0.484 m AV Peak Grad:      19.8 mmHg AV Mean Grad:      13.0 mmHg LVOT Vmax:         76.30 cm/s LVOT Vmean:        59.200 cm/s LVOT VTI:          0.156 m LVOT/AV VTI ratio: 0.32  AORTA Ao Root diam: 3.70 cm Ao Asc diam:  4.20 cm MITRAL VALVE MV Area (PHT): 2.31 cm    SHUNTS MV Decel Time: 329  msec    Systemic VTI:  0.16 m MV E velocity: 59.50 cm/s  Systemic Diam: 2.30 cm MV A velocity: 36.50 cm/s MV E/A ratio:  1.63 Sunit Tolia DO Electronically signed by Rex Kras DO Signature Date/Time: 12/13/2021/4:46:11 PM    Final    DG Chest Port 1 View  Result Date: 12/12/2021 CLINICAL DATA:  Intermittent chest pain EXAM: PORTABLE CHEST 1 VIEW COMPARISON:  June 03, 2021 FINDINGS: Sternotomy wires are intact. The heart, hila, mediastinum, lungs, and pleura are unremarkable. IMPRESSION: No active disease. Electronically Signed   By: Dorise Bullion III M.D.   On: 12/12/2021 17:31    Microbiology: Results for orders placed or performed during the hospital encounter of 12/12/21  MRSA Next Gen by PCR, Nasal     Status: None   Collection Time: 12/13/21  2:56 AM   Specimen: Nasal Mucosa; Nasal Swab  Result Value Ref Range Status   MRSA by PCR Next Gen NOT DETECTED NOT DETECTED Final    Comment: (NOTE) The GeneXpert MRSA Assay (FDA approved for NASAL specimens only), is one component of a comprehensive MRSA colonization surveillance program. It is not intended to diagnose MRSA infection nor to guide or monitor treatment for MRSA infections. Test performance is not FDA approved in patients less than 55 years old. Performed at [facility], [place] [address] [place], [place] [address]     Labs: CBC: Recent Labs  Lab 12/12/21 1628 12/13/21 0750  WBC 6.4 5.9  HGB 14.5 14.3  HCT 44.0 42.8  MCV 91.7 89.7  PLT 210 272   Basic Metabolic Panel: Recent Labs  Lab 12/12/21 1628 12/13/21 0750  NA 136 137  K 3.9 3.8  CL 106 107  CO2 22 24  GLUCOSE 221* 222*  BUN 19 15  CREATININE 0.77 0.69  CALCIUM 9.3 9.3   Liver Function Tests: Recent Labs  Lab 12/12/21 1704  AST 20  ALT 18  ALKPHOS 80  BILITOT 0.8  PROT 6.6  ALBUMIN 3.6   CBG: Recent Labs  Lab 12/13/21 1252 12/13/21 1625 12/13/21 2141 12/14/21 0611 12/14/21 1152  GLUCAP 245* 188* 283* 171* 178*     Discharge time spent: greater than 30 minutes.  Signed: Berle Mull, MD [facility] 12/14/2021

## 2021-12-21 DIAGNOSIS — M059 Rheumatoid arthritis with rheumatoid factor, unspecified: Secondary | ICD-10-CM | POA: Diagnosis not present

## 2021-12-22 ENCOUNTER — Other Ambulatory Visit: Payer: Self-pay | Admitting: Cardiology

## 2021-12-22 DIAGNOSIS — I5022 Chronic systolic (congestive) heart failure: Secondary | ICD-10-CM

## 2021-12-24 ENCOUNTER — Ambulatory Visit: Payer: Medicare PPO | Admitting: Cardiology

## 2021-12-24 ENCOUNTER — Encounter: Payer: Self-pay | Admitting: Cardiology

## 2021-12-24 VITALS — BP 123/69 | HR 68 | Temp 98.6°F | Resp 16 | Ht 78.0 in | Wt 189.0 lb

## 2021-12-24 DIAGNOSIS — I350 Nonrheumatic aortic (valve) stenosis: Secondary | ICD-10-CM

## 2021-12-24 DIAGNOSIS — I2510 Atherosclerotic heart disease of native coronary artery without angina pectoris: Secondary | ICD-10-CM | POA: Diagnosis not present

## 2021-12-24 DIAGNOSIS — I480 Paroxysmal atrial fibrillation: Secondary | ICD-10-CM | POA: Diagnosis not present

## 2021-12-24 DIAGNOSIS — I255 Ischemic cardiomyopathy: Secondary | ICD-10-CM | POA: Diagnosis not present

## 2021-12-24 DIAGNOSIS — E782 Mixed hyperlipidemia: Secondary | ICD-10-CM

## 2021-12-24 DIAGNOSIS — I5022 Chronic systolic (congestive) heart failure: Secondary | ICD-10-CM | POA: Diagnosis not present

## 2021-12-24 DIAGNOSIS — Z951 Presence of aortocoronary bypass graft: Secondary | ICD-10-CM

## 2021-12-24 DIAGNOSIS — I447 Left bundle-branch block, unspecified: Secondary | ICD-10-CM

## 2021-12-24 DIAGNOSIS — Z79899 Other long term (current) drug therapy: Secondary | ICD-10-CM | POA: Diagnosis not present

## 2021-12-24 DIAGNOSIS — E1129 Type 2 diabetes mellitus with other diabetic kidney complication: Secondary | ICD-10-CM | POA: Diagnosis not present

## 2021-12-24 DIAGNOSIS — Z9889 Other specified postprocedural states: Secondary | ICD-10-CM | POA: Diagnosis not present

## 2021-12-24 DIAGNOSIS — R809 Proteinuria, unspecified: Secondary | ICD-10-CM

## 2021-12-24 DIAGNOSIS — Z7901 Long term (current) use of anticoagulants: Secondary | ICD-10-CM

## 2021-12-24 DIAGNOSIS — Z8679 Personal history of other diseases of the circulatory system: Secondary | ICD-10-CM

## 2021-12-24 NOTE — Progress Notes (Signed)
ID:  Derek Bond, DOB [birth year], MRN [id number]  PCP:  Glenda Chroman, MD  Cardiologist:  Rex Kras, DO, [place] (established care 01/26/2021)  Date: 12/24/21 Last Office Visit: 09/22/2021  Chief Complaint  Patient presents with   Coronary Artery Disease   heart failure management    HPI  Derek Bond is a 76 y.o. male whose past medical history and cardiovascular risk factors include: Ischemic cardiomyopathy, acute/chronic heart failure with reduced EF, CAD s/p CABG / Clipping of atrial appendage using 55m atrial clip, aortic stenosis, hypertension, diabetes mellitus with microalbuminuria, erectile dysfunction, heartburn, former smoker, hyperlipidemia, sleep apnea not on CPAP, paroxysmal atrial fibrillation status post Maze procedure and DDCV (06/10/2021), advanced age.  He is referred to the office at the request of VJerene BearsB, MD for evaluation of shortness of breath.  He is accompanied by his daughter ALevada Dyat today's office visit who also provides collateral history as part of today's encounter.  In September 2022 he presented with symptoms of progressive dyspnea initially thought to be secondary to atrial fibrillation.  However, due to recurrent symptoms and angina he was recommended to undergo heart catheterization.  Patient was found to have multivessel CAD and subsequently underwent four-vessel bypass, left atrial Maze procedure, and 45 mm atrial clip placement and he was discharged home on 03/04/2021.  Postoperatively patient continued to be in A-fib despite medical therapy and therefore was scheduled for TEE guided cardioversion on 06/12/2021 which restored NSR and he was placed on antiarrhythmic medications.  He presented to the hospital since last office visit in July 2023 with symptoms of chest pain findings suggestive of a stable angina.  He underwent an echocardiogram and nuclear stress test during his hospitalization results noted below.  I uptitrated his  antianginal therapy to Ranexa 500 mg p.o. twice daily and transitioned him from amiodarone to Multaq.  He now presents for follow-up.  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   FUNCTIONAL STATUS: No structured exercise program or daily routine.   ALLERGIES: Allergies  Allergen Reactions   Bee Venom Swelling    MEDICATION LIST PRIOR TO VISIT: Current Meds  Medication Sig   acetaminophen (TYLENOL) 500 MG tablet Take 1,000 mg by mouth every 6 (six) hours as needed for moderate pain or mild pain.   apixaban (ELIQUIS) 5 MG TABS tablet Take 1 tablet (5 mg total) by mouth 2 (two) times daily.   aspirin EC 81 MG tablet Take 81 mg by mouth daily.   dronedarone (MULTAQ) 400 MG tablet Take 1 tablet (400 mg total) by mouth 2 (two) times daily with a meal.   famotidine (PEPCID) 40 MG tablet Take 40 mg by mouth at bedtime.   FARXIGA 10 MG TABS tablet TAKE 1 TABLET EVERY DAY   glipiZIDE (GLUCOTROL XL) 10 MG 24 hr tablet Take 10 mg by mouth 2 (two) times daily.   KLOR-CON M10 10 MEQ tablet TAKE 1 TABLET BY MOUTH EVERY DAY   Magnesium Oxide 400 MG CAPS Take 1 capsule (400 mg total) by mouth daily.   metFORMIN (GLUCOPHAGE) 1000 MG tablet Take 1,000 mg by mouth daily with breakfast.   methotrexate (RHEUMATREX) 2.5 MG tablet Take 2.5 mg by mouth as directed.   metoprolol succinate (TOPROL-XL) 25 MG 24 hr tablet Take 1 tablet (25 mg total) by mouth every morning.   nitroGLYCERIN (NITROSTAT) 0.4 MG SL tablet  Place under the tongue.   pantoprazole (PROTONIX) 40 MG tablet Take 40 mg by mouth every evening.   ranolazine (RANEXA) 500 MG 12 hr tablet Take 1 tablet (500 mg total) by mouth 2 (two) times daily.   rosuvastatin (CRESTOR) 5 MG tablet Take 5 mg by mouth at bedtime.   sacubitril-valsartan (ENTRESTO) 97-103 MG Take 1 tablet by mouth 2 (two) times daily.   traMADol (ULTRAM) 50 MG  tablet Take by mouth every 6 (six) hours as needed.     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    Due to laceration at age 66    Hiatal hernia    History of melanoma    Obstructive sleep apnea    Type 2 diabetes mellitus ([place])     PAST SURGICAL HISTORY: Past Surgical History:  Procedure Laterality Date   BIOPSY  12/21/2019   Procedure: BIOPSY;  Surgeon: Harvel Quale, MD;  [facility];  Service: Gastroenterology;;  random colon   CARDIOVERSION N/A 06/10/2021   Procedure: CARDIOVERSION;  Surgeon: Rex Kras, DO;  [facility]: [facility];  Service: Cardiovascular;  Laterality: N/A;   CLIPPING OF ATRIAL APPENDAGE N/A 02/26/2021   Procedure: CLIPPING OF ATRIAL APPENDAGE USING ATRICURE CLIP SIZE 84;  Surgeon: Lajuana Matte, MD;  Location: [place];  Service: Open Heart Surgery;  Laterality: N/A;   COLONOSCOPY WITH PROPOFOL N/A 12/21/2019   Procedure: COLONOSCOPY WITH PROPOFOL;  Surgeon: Harvel Quale, MD;  [facility];  Service: Gastroenterology;  Laterality: N/A;  915   CORONARY ARTERY BYPASS GRAFT N/A 02/26/2021   Procedure: CORONARY ARTERY BYPASS GRAFTING (CABG) X4, ON PUMP, USING LEFT INTERNAL MAMMARY ARTERY AND RIGHT ENDOSCOPICALLY HARVESTED GREATER SAPHENOUS VEIN;  Surgeon: Lajuana Matte, MD;  Location: [place];  Service: Open Heart Surgery;  Laterality: N/A;   ENDOVEIN HARVEST OF GREATER SAPHENOUS VEIN Right 02/26/2021   Procedure: ENDOVEIN HARVEST OF GREATER SAPHENOUS VEIN;  Surgeon: Lajuana Matte, MD;  Location: [place];  Service: Open Heart Surgery;  Laterality: Right;   MAZE N/A 02/26/2021   Procedure: MAZE;  Surgeon: Lajuana Matte, MD;  Location: [place];  Service: Open Heart Surgery;   Laterality: N/A;   MELANOMA EXCISION     Left facial region   REPLACEMENT TOTAL KNEE Left    RIGHT/LEFT HEART CATH AND CORONARY ANGIOGRAPHY N/A 02/23/2021   Procedure: RIGHT/LEFT HEART CATH AND CORONARY ANGIOGRAPHY;  Surgeon: Nigel Mormon, MD;  Location: [place] [facility];  Service: Cardiovascular;  Laterality: N/A;   TEE WITHOUT CARDIOVERSION N/A 02/26/2021   Procedure: TRANSESOPHAGEAL ECHOCARDIOGRAM (TEE);  Surgeon: Lajuana Matte, MD;  Location: [place];  Service: Open Heart Surgery;  Laterality: N/A;   TEE WITHOUT CARDIOVERSION N/A 06/10/2021   Procedure: TRANSESOPHAGEAL ECHOCARDIOGRAM (TEE);  Surgeon: Rex Kras, DO;  [facility];  Service: Cardiovascular;  Laterality: N/A;   UMBILICAL HERNIA REPAIR      FAMILY HISTORY: The patient family history includes Diabetes in his brother, mother, and another family member; Hypertension in his brother, brother, sister, sister, and another family member; Lung cancer in his father; Rheum arthritis in an other family member; Stroke in an other family member.  SOCIAL HISTORY:  The patient  reports that  he quit smoking about 32 years ago. His smoking use included cigarettes. He has a 60.00 pack-year smoking history. He has never used smokeless tobacco. He reports that he does not currently use alcohol. He reports that he does not use drugs.  REVIEW OF SYSTEMS: Review of Systems  Constitutional: Positive for malaise/fatigue (better) and weight gain. Negative for chills, fever and weight loss.  HENT:  Negative for hoarse voice and nosebleeds.   Eyes:  Negative for discharge, double vision and pain.  Cardiovascular:  Negative for chest pain, claudication, dyspnea on exertion, leg swelling, near-syncope, orthopnea, palpitations, paroxysmal nocturnal dyspnea and syncope.  Respiratory:  Negative for hemoptysis and shortness of breath.   Endocrine: Negative for heat intolerance.  Musculoskeletal:  Negative for muscle cramps and  myalgias.  Gastrointestinal:  Negative for abdominal pain, constipation, diarrhea, heartburn, hematemesis, hematochezia, melena, nausea and vomiting.  Neurological:  Negative for dizziness and light-headedness.    PHYSICAL EXAM:    12/24/2021    2:20 PM 12/14/2021   11:53 AM 12/14/2021   10:52 AM  Vitals with BMI  Height '6\' 6"'$     Weight 189 lbs    BMI 18.29    Systolic 937 169   Diastolic 69 88   Pulse 68 79 77    CONSTITUTIONAL: Well-developed and well-nourished. No acute distress.  SKIN: Skin is warm and dry. No rash noted. No cyanosis. No pallor. No jaundice HEAD: Normocephalic and atraumatic.  EYES: No scleral icterus MOUTH/THROAT: Moist oral membranes.  NECK: No JVD present. No thyromegaly noted. No carotid bruits  LYMPHATIC: No visible cervical adenopathy.  CHEST Normal respiratory effort. No intercostal retractions.  Sternotomy site is healing well. LUNGS: Clear to auscultation bilaterally.  No stridor. No wheezes. No rales.  CARDIOVASCULAR: Regular, S1-S2, no murmurs rubs or gallops appreciated secondary to tachycardia. ABDOMINAL: Soft, nontender, nondistended, positive bowel sounds in all 4 quadrants, no apparent ascites.  EXTREMITIES: No pitting edema. Right lower extremity warm to touch, erythematous.  No active drainage or wounds. HEMATOLOGIC: No significant bruising NEUROLOGIC: Oriented to person, place, and time. Nonfocal. Normal muscle tone.  PSYCHIATRIC: Normal mood and affect. Normal behavior. Cooperative No significant change in physical examination since last office visit.  CARDIAC DATABASE: 02/26/2021: CABG x4 (LIMA to LAD, SVG to DIAGONAL, SVG to OM, SVG to PLB) + MAZE + 45 mm atrial clip placement.  06/10/2021: TEE guided cardioversion for restoration of normal sinus rhythm with synchronized biphasic 200 J x 2  EKG: 01/26/2021: Atrial fibrillation with rapid ventricular rate 126 bpm, left axis deviation, left anterior fascicular block, left bundle branch  block, consider old inferior infarct.  05/19/2021: Atrial fibrillation, 110 bpm, left axis, LBBB.  06/23/2021: NSR, 80bpm, LBBB, frequent PVC.  09/22/2021: Normal sinus rhythm, 81 bpm, left axis, left bundle branch block, without underlying ischemia or injury pattern.  Echocardiogram: 02/05/2021:  Severely depressed LV systolic function with visual EF 25-30%. Hypokinetic global wall motion with regionality involving septal wall. Left ventricle cavity is mildly dilated. Moderate left ventricular hypertrophy. Unable to evaluate diastolic function due to atrial fibrillation. Elevated LAP.  Moderate (Grade II) mitral regurgitation.  Moderate tricuspid regurgitation. Mild pulmonary hypertension, RVSP 9mHg.  IVC is dilated with a respiratory response of <50%.  No prior study for comparison.  02/25/2021:  1. Left ventricular ejection fraction, by estimation, is 30 to 35%. The left ventricle has moderately decreased function. The left ventricle demonstrates global hypokinesis. There is moderate left ventricular hypertrophy. Left ventricular diastolic function could not be evaluated.  2. Right ventricular systolic function is normal. The right ventricular size is mildly enlarged. There is normal pulmonary artery systolic pressure.   3. Left atrial size was severely dilated.   4. Right atrial size was dilated.   5. The mitral valve is normal in structure. Mild mitral valve regurgitation. No evidence of mitral stenosis.   6. The aortic valve is tricuspid. Aortic valve regurgitation is not visualized.   7. There is mild dilatation of the ascending aorta, measuring 40 mm.   11/2021: LVEF 40-45%, G1DD, moderate AS, proximal As Ao 33m, estimated RAP 358mG.   Stress Testing: MPI 12/14/2021 1. No reversible perfusion defects identified. Small in size, mild in severity fixed defect is noted involving the basal inferior and mid inferior segments compatible with infarction involving the RCA.  Medium in  size, mild in severity fixed defect involves the apical septal, apical inferior and apicolateral and apical segments. This is compatible with infarct involving the LAD.   2. Moderate apical and inferior hypokinesis with paradoxical motion of the septum. No left ventricular dilatation.   3. Left ventricular ejection fraction 40%, decreased from 55% on the previous exam.   4. Non invasive risk stratification*: Intermediate  Heart Catheterization: Left and right heart catheterization 02/23/2021 LM: Normal LAD: Prox 80% stenosius at bifurcation with small to medium caliber D1 with 75% long prox disease. Mid 70% disease Ramus: Minimal luminal irregularities Lcx: OM1 with prox 80% stenosis RCA: 60-70% disease in PL branches   RA: 2 mmHg RV: 22/0 mmHg PA: 21/5 mmHg, mPAP 13 mmHg PCW: 8 mmHg   CO: 5.6 L/min CI: 2.5 L/min/m2     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%, diabetic patient CVTS consult for CABG  LABORATORY DATA:    Latest Ref Rng & Units 12/13/2021    7:50 AM 12/12/2021    4:28 PM 10/05/2021   11:11 AM  CBC  WBC 4.0 - 10.5 K/uL 5.9  6.4    Hemoglobin 13.0 - 17.0 g/dL 14.3  14.5  14.4   Hematocrit 39.0 - 52.0 % 42.8  44.0  44.3   Platelets 150 - 400 K/uL 202  210         Latest Ref Rng & Units 12/13/2021    7:50 AM 12/12/2021    5:04 PM 12/12/2021    4:28 PM  CMP  Glucose 70 - 99 mg/dL 222   221   BUN 8 - 23 mg/dL 15   19   Creatinine 0.61 - 1.24 mg/dL 0.69   0.77   Sodium 135 - 145 mmol/L 137   136   Potassium 3.5 - 5.1 mmol/L 3.8   3.9   Chloride 98 - 111 mmol/L 107   106   CO2 22 - 32 mmol/L 24   22   Calcium 8.9 - 10.3 mg/dL 9.3   9.3   Total Protein 6.5 - 8.1 g/dL  6.6    Total Bilirubin 0.3 - 1.2 mg/dL  0.8    Alkaline Phos 38 - 126 U/L  80    AST 15 - 41 U/L  20    ALT 0 - 44 U/L  18      Lipid Panel     Component Value Date/Time   CHOL 120 12/13/2021 1035   TRIG 59 12/13/2021 1035   HDL 50 12/13/2021 1035   CHOLHDL 2.4 12/13/2021 1035    VLDL 12 12/13/2021 1035   LDLCALC 58 12/13/2021 1035   LDLDIRECT 54 12/13/2021 1035    No  components found for: "NTPROBNP" Recent Labs    02/05/21 1441 02/18/21 1534 03/23/21 1459 03/30/21 0957 05/12/21 1346 10/05/21 1111  PROBNP 1,567* 2,355* 1,938* 1,722* 1,668* 455   Recent Labs    05/12/21 1346 10/05/21 1111 12/13/21 0750  TSH 3.800 2.500 2.403    BMP Recent Labs    04/14/21 1419 05/12/21 1346 10/05/21 1111 12/12/21 1628 12/13/21 0750  NA 135   < > 141 136 137  K 4.3   < > 4.6 3.9 3.8  CL 101   < > 105 106 107  CO2 23   < > '23 22 24  '$ GLUCOSE 129*   < > 228* 221* 222*  BUN 12   < > '21 19 15  '$ CREATININE 0.85   < > 0.77 0.77 0.69  CALCIUM 9.6   < > 9.9 9.3 9.3  GFRNONAA >60  --   --  >60 >60   < > = values in this interval not displayed.    HEMOGLOBIN A1C Lab Results  Component Value Date   HGBA1C 8.7 (H) 12/13/2021   MPG 202.99 12/13/2021    IMPRESSION:    ICD-10-CM   1. 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 Paroxysmal atrial fibrillation (HCC)  I48.0     3. S/P Maze operation for atrial fibrillation  Z98.890    Z86.79     4. Long term current use of antiarrhythmic drug  Z79.899     5. Long term (current) use of anticoagulants  Z79.01     6. Atherosclerosis of native coronary artery of native heart without angina pectoris  I25.10     7. S/P CABG x 4  Z95.1     8. LBBB (left bundle branch block)  I44.7     9. Ischemic cardiomyopathy  I25.5     10. Nonrheumatic aortic valve stenosis  I35.0     11. Type 2 diabetes mellitus with microalbuminuria, without long-term current use of insulin (HCC)  E11.29    R80.9     12. Mixed hyperlipidemia  E78.2         RECOMMENDATIONS: Derek Bond is a 76 y.o. male whose past medical history and cardiac risk factors include: Ischemic cardiomyopathy, acute/chronic heart failure with reduced EF, hypertension, diabetes mellitus with microalbuminuria,  erectile dysfunction, heartburn, former smoker, hyperlipidemia, sleep apnea not on CPAP, persistent atrial fibrillation status post Maze procedure, advanced age.  Chronic HFrEF (heart failure with reduced ejection fraction) (HCC) Stage C, NYHA class II. Echo 12/13/2021: 78-29%, grade 1 diastolic impairment, other details noted above Medications reconciled. No changes warranted at this time. Euvolemic on physical examination.  Paroxysmal atrial fibrillation ([place]), s/p Maze, Long term current use of antiarrhythmic & anticoagulation, s/p TEE/DCCV Currently normal sinus rhythm. Rate control: Metoprolol. Rhythm control: Multaq Thromboembolic prophylaxis: Eliquis. CHA2DS2-VASc SCORE is 5 which correlates to 6.7% risk of stroke per year.  Successfully underwent TEE guided cardioversion 05/2021. Does not endorse bleeding. Transition to Multaq during his hospitalization in July 2023.  Atherosclerosis of native coronary artery of native heart without angina pectoris, S/P CABG x 4, ICMP, LBBB Hospitalized in July 2023 with symptoms consistent with stable angina. Underwent an echo and nuclear stress test as outlined above. Currently symptom-free. Tolerated Ranexa well without any side effects or intolerances. No use of sublingual nitroglycerin tablets. Educated on importance of secondary prevention and improving his modifiable cardiovascular risk factors. Recent hospitalization records reviewed including discharge summary, cardiology progress note,  echo and MPI results.  Aortic stenosis: Asymptomatic. Moderate intensity as per the last echocardiogram. Patient is asked to be cognizant for symptoms of angina pectoris, heart failure, or syncope Monitor for now  Type 2 diabetes mellitus with microalbuminuria, without long-term current use of insulin ([place]) Educated on the importance of glycemic control. Currently on Entresto, Farxiga, statin therapy  Mixed hyperlipidemia Currently on  rosuvastatin.   He denies myalgia or other side effects. Currently being managed by PCP according to the patient.  Benign hypertension Office blood pressures are very well controlled. Medications reconciled. No changes warranted.  Recommend 81-monthfollow-up visit; however, patient would like to continue his 367-monthisits.  FINAL MEDICATION LIST END OF ENCOUNTER: No orders of the defined types were placed in this encounter.  There are no discontinued medications.     Current Outpatient Medications:    acetaminophen (TYLENOL) 500 MG tablet, Take 1,000 mg by mouth every 6 (six) hours as needed for moderate pain or mild pain., Disp: , Rfl:    apixaban (ELIQUIS) 5 MG TABS tablet, Take 1 tablet (5 mg total) by mouth 2 (two) times daily., Disp: 180 tablet, Rfl: 1   aspirin EC 81 MG tablet, Take 81 mg by mouth daily., Disp: , Rfl:    dronedarone (MULTAQ) 400 MG tablet, Take 1 tablet (400 mg total) by mouth 2 (two) times daily with a meal., Disp: 60 tablet, Rfl: 0   famotidine (PEPCID) 40 MG tablet, Take 40 mg by mouth at bedtime., Disp: , Rfl:    FARXIGA 10 MG TABS tablet, TAKE 1 TABLET EVERY DAY, Disp: 90 tablet, Rfl: 0   glipiZIDE (GLUCOTROL XL) 10 MG 24 hr tablet, Take 10 mg by mouth 2 (two) times daily., Disp: , Rfl:    KLOR-CON M10 10 MEQ tablet, TAKE 1 TABLET BY MOUTH EVERY DAY, Disp: 30 tablet, Rfl: 0   Magnesium Oxide 400 MG CAPS, Take 1 capsule (400 mg total) by mouth daily., Disp: 90 capsule, Rfl: 1   metFORMIN (GLUCOPHAGE) 1000 MG tablet, Take 1,000 mg by mouth daily with breakfast., Disp: , Rfl:    methotrexate (RHEUMATREX) 2.5 MG tablet, Take 2.5 mg by mouth as directed., Disp: , Rfl:    metoprolol succinate (TOPROL-XL) 25 MG 24 hr tablet, Take 1 tablet (25 mg total) by mouth every morning., Disp: 30 tablet, Rfl: 0   nitroGLYCERIN (NITROSTAT) 0.4 MG SL tablet, Place under the tongue., Disp: , Rfl:    pantoprazole (PROTONIX) 40 MG tablet, Take 40 mg by mouth every evening.,  Disp: , Rfl:    ranolazine (RANEXA) 500 MG 12 hr tablet, Take 1 tablet (500 mg total) by mouth 2 (two) times daily., Disp: 60 tablet, Rfl: 0   rosuvastatin (CRESTOR) 5 MG tablet, Take 5 mg by mouth at bedtime., Disp: , Rfl:    sacubitril-valsartan (ENTRESTO) 97-103 MG, Take 1 tablet by mouth 2 (two) times daily., Disp: 180 tablet, Rfl: 1   traMADol (ULTRAM) 50 MG tablet, Take by mouth every 6 (six) hours as needed., Disp: , Rfl:   No orders of the defined types were placed in this encounter.  There are no Patient Instructions on file for this visit.   --Continue cardiac medications as reconciled in final medication list. --Return in about 3 months (around 03/26/2022) for Follow up, CAD, A. fib, heart failure management.. Or sooner if needed. --Continue follow-up with your primary care physician regarding the management of your other chronic comorbid conditions.  Patient's questions and concerns were addressed to  his satisfaction. He voices understanding of the instructions provided during this encounter.   This note was created using a voice recognition software as a result there may be grammatical errors inadvertently enclosed that do not reflect the nature of this encounter. Every attempt is made to correct such errors.  As part of today's office visit discussed management of least 2 chronic comorbid conditions, ordered additional diagnostic work-up, management/monitoring for drug toxicity, medications reconciled/refilled, most recent labs independently reviewed, plan of care and HPI discussed with his daughter at today's office visit.  Rex Kras, [place], [facility]  Pager: [phone] Office: [phone]

## 2021-12-25 DIAGNOSIS — I10 Essential (primary) hypertension: Secondary | ICD-10-CM | POA: Diagnosis not present

## 2021-12-25 DIAGNOSIS — Z789 Other specified health status: Secondary | ICD-10-CM | POA: Diagnosis not present

## 2021-12-25 DIAGNOSIS — M059 Rheumatoid arthritis with rheumatoid factor, unspecified: Secondary | ICD-10-CM | POA: Diagnosis not present

## 2021-12-25 DIAGNOSIS — M25541 Pain in joints of right hand: Secondary | ICD-10-CM | POA: Diagnosis not present

## 2021-12-25 DIAGNOSIS — M25542 Pain in joints of left hand: Secondary | ICD-10-CM | POA: Diagnosis not present

## 2021-12-25 DIAGNOSIS — Z299 Encounter for prophylactic measures, unspecified: Secondary | ICD-10-CM | POA: Diagnosis not present

## 2021-12-30 ENCOUNTER — Other Ambulatory Visit: Payer: Self-pay | Admitting: Cardiology

## 2021-12-30 DIAGNOSIS — I5022 Chronic systolic (congestive) heart failure: Secondary | ICD-10-CM

## 2021-12-30 MED ORDER — POTASSIUM CHLORIDE CRYS ER 10 MEQ PO TBCR: 10.0000 meq | EXTENDED_RELEASE_TABLET | Freq: Every day | ORAL | 0 refills | Status: DC

## 2022-01-02 DIAGNOSIS — G4733 Obstructive sleep apnea (adult) (pediatric): Secondary | ICD-10-CM | POA: Diagnosis not present

## 2022-01-08 ENCOUNTER — Other Ambulatory Visit: Payer: Self-pay

## 2022-01-08 DIAGNOSIS — Z6822 Body mass index (BMI) 22.0-22.9, adult: Secondary | ICD-10-CM | POA: Diagnosis not present

## 2022-01-08 DIAGNOSIS — Z0000 Encounter for general adult medical examination without abnormal findings: Secondary | ICD-10-CM | POA: Diagnosis not present

## 2022-01-08 DIAGNOSIS — I10 Essential (primary) hypertension: Secondary | ICD-10-CM | POA: Diagnosis not present

## 2022-01-08 DIAGNOSIS — Z1331 Encounter for screening for depression: Secondary | ICD-10-CM | POA: Diagnosis not present

## 2022-01-08 DIAGNOSIS — Z79899 Other long term (current) drug therapy: Secondary | ICD-10-CM | POA: Diagnosis not present

## 2022-01-08 DIAGNOSIS — R5383 Other fatigue: Secondary | ICD-10-CM | POA: Diagnosis not present

## 2022-01-08 DIAGNOSIS — Z7189 Other specified counseling: Secondary | ICD-10-CM | POA: Diagnosis not present

## 2022-01-08 DIAGNOSIS — Z1339 Encounter for screening examination for other mental health and behavioral disorders: Secondary | ICD-10-CM | POA: Diagnosis not present

## 2022-01-08 DIAGNOSIS — E039 Hypothyroidism, unspecified: Secondary | ICD-10-CM | POA: Diagnosis not present

## 2022-01-08 DIAGNOSIS — Z299 Encounter for prophylactic measures, unspecified: Secondary | ICD-10-CM | POA: Diagnosis not present

## 2022-01-08 DIAGNOSIS — Z125 Encounter for screening for malignant neoplasm of prostate: Secondary | ICD-10-CM | POA: Diagnosis not present

## 2022-01-08 DIAGNOSIS — E7800 Pure hypercholesterolemia, unspecified: Secondary | ICD-10-CM | POA: Diagnosis not present

## 2022-01-08 MED ORDER — DRONEDARONE HCL 400 MG PO TABS: 400.0000 mg | ORAL_TABLET | Freq: Two times a day (BID) | ORAL | 0 refills | Status: DC

## 2022-01-11 ENCOUNTER — Other Ambulatory Visit: Payer: Self-pay

## 2022-01-11 MED ORDER — RANOLAZINE ER 500 MG PO TB12: 500.0000 mg | ORAL_TABLET | Freq: Two times a day (BID) | ORAL | 0 refills | Status: DC

## 2022-01-13 ENCOUNTER — Other Ambulatory Visit: Payer: Self-pay

## 2022-01-13 DIAGNOSIS — E1165 Type 2 diabetes mellitus with hyperglycemia: Secondary | ICD-10-CM | POA: Diagnosis not present

## 2022-01-13 DIAGNOSIS — M25561 Pain in right knee: Secondary | ICD-10-CM | POA: Diagnosis not present

## 2022-01-13 DIAGNOSIS — I10 Essential (primary) hypertension: Secondary | ICD-10-CM | POA: Diagnosis not present

## 2022-01-13 DIAGNOSIS — M13861 Other specified arthritis, right knee: Secondary | ICD-10-CM | POA: Diagnosis not present

## 2022-01-14 DIAGNOSIS — E7800 Pure hypercholesterolemia, unspecified: Secondary | ICD-10-CM | POA: Diagnosis not present

## 2022-01-14 DIAGNOSIS — M1711 Unilateral primary osteoarthritis, right knee: Secondary | ICD-10-CM | POA: Diagnosis not present

## 2022-01-14 DIAGNOSIS — I10 Essential (primary) hypertension: Secondary | ICD-10-CM | POA: Diagnosis not present

## 2022-01-14 DIAGNOSIS — Z299 Encounter for prophylactic measures, unspecified: Secondary | ICD-10-CM | POA: Diagnosis not present

## 2022-01-19 ENCOUNTER — Telehealth: Payer: Self-pay

## 2022-01-19 NOTE — Telephone Encounter (Signed)
Spoke with patient and patient states that he needed a refill for the RX multaq but was told by [facility] in [place] that they could not refill the medication because they showed him taking Amiodarone and Multaq . Tried to call pharmacy but had to leave VM, will call back again

## 2022-01-19 NOTE — Telephone Encounter (Signed)
Patient called and left VM stating that he needed refills but did not specify which medication. Returned patients call and LVM for patient to call back to discuss which meds

## 2022-02-02 DIAGNOSIS — G4733 Obstructive sleep apnea (adult) (pediatric): Secondary | ICD-10-CM | POA: Diagnosis not present

## 2022-02-04 DIAGNOSIS — I10 Essential (primary) hypertension: Secondary | ICD-10-CM | POA: Diagnosis not present

## 2022-02-04 DIAGNOSIS — E1165 Type 2 diabetes mellitus with hyperglycemia: Secondary | ICD-10-CM | POA: Diagnosis not present

## 2022-02-04 DIAGNOSIS — Z79899 Other long term (current) drug therapy: Secondary | ICD-10-CM | POA: Diagnosis not present

## 2022-02-04 DIAGNOSIS — Z23 Encounter for immunization: Secondary | ICD-10-CM | POA: Diagnosis not present

## 2022-02-04 DIAGNOSIS — Z299 Encounter for prophylactic measures, unspecified: Secondary | ICD-10-CM | POA: Diagnosis not present

## 2022-02-09 ENCOUNTER — Other Ambulatory Visit: Payer: Self-pay

## 2022-02-09 MED ORDER — RANOLAZINE ER 500 MG PO TB12: 500.0000 mg | ORAL_TABLET | Freq: Two times a day (BID) | ORAL | 3 refills | Status: DC

## 2022-02-11 ENCOUNTER — Other Ambulatory Visit: Payer: Self-pay | Admitting: Cardiology

## 2022-02-11 DIAGNOSIS — I5022 Chronic systolic (congestive) heart failure: Secondary | ICD-10-CM

## 2022-02-12 DIAGNOSIS — E1165 Type 2 diabetes mellitus with hyperglycemia: Secondary | ICD-10-CM | POA: Diagnosis not present

## 2022-02-12 DIAGNOSIS — I10 Essential (primary) hypertension: Secondary | ICD-10-CM | POA: Diagnosis not present

## 2022-02-13 ENCOUNTER — Other Ambulatory Visit: Payer: Self-pay | Admitting: Cardiology

## 2022-02-15 NOTE — Telephone Encounter (Signed)
Refill request

## 2022-02-17 ENCOUNTER — Other Ambulatory Visit: Payer: Self-pay | Admitting: Cardiology

## 2022-02-17 DIAGNOSIS — I5022 Chronic systolic (congestive) heart failure: Secondary | ICD-10-CM

## 2022-02-18 ENCOUNTER — Telehealth: Payer: Self-pay

## 2022-02-18 ENCOUNTER — Other Ambulatory Visit: Payer: Self-pay

## 2022-02-18 MED ORDER — MULTAQ 400 MG PO TABS: 400.0000 mg | ORAL_TABLET | Freq: Two times a day (BID) | ORAL | 1 refills | Status: DC

## 2022-02-18 NOTE — Telephone Encounter (Signed)
Patient called requesting a refill on Derek Bond. Patient also asked if you can send in a 90 day supply instead of 30. Please advise.

## 2022-02-18 NOTE — Telephone Encounter (Signed)
Yes please send 90 supply for Multaq  Dr. Terri Skains

## 2022-02-19 DIAGNOSIS — M1711 Unilateral primary osteoarthritis, right knee: Secondary | ICD-10-CM | POA: Diagnosis not present

## 2022-02-22 ENCOUNTER — Other Ambulatory Visit: Payer: Self-pay | Admitting: Cardiology

## 2022-02-22 DIAGNOSIS — I5022 Chronic systolic (congestive) heart failure: Secondary | ICD-10-CM

## 2022-03-03 ENCOUNTER — Other Ambulatory Visit (HOSPITAL_COMMUNITY): Payer: Self-pay

## 2022-03-04 ENCOUNTER — Encounter: Payer: Self-pay | Admitting: Cardiology

## 2022-03-04 DIAGNOSIS — M1711 Unilateral primary osteoarthritis, right knee: Secondary | ICD-10-CM | POA: Diagnosis not present

## 2022-03-04 DIAGNOSIS — G4733 Obstructive sleep apnea (adult) (pediatric): Secondary | ICD-10-CM | POA: Diagnosis not present

## 2022-03-04 NOTE — Telephone Encounter (Signed)
From patient.

## 2022-03-11 DIAGNOSIS — M1711 Unilateral primary osteoarthritis, right knee: Secondary | ICD-10-CM | POA: Diagnosis not present

## 2022-03-15 DIAGNOSIS — I10 Essential (primary) hypertension: Secondary | ICD-10-CM | POA: Diagnosis not present

## 2022-03-15 DIAGNOSIS — E1165 Type 2 diabetes mellitus with hyperglycemia: Secondary | ICD-10-CM | POA: Diagnosis not present

## 2022-03-18 DIAGNOSIS — M1711 Unilateral primary osteoarthritis, right knee: Secondary | ICD-10-CM | POA: Diagnosis not present

## 2022-03-22 ENCOUNTER — Other Ambulatory Visit: Payer: Self-pay

## 2022-03-22 MED ORDER — MULTAQ 400 MG PO TABS: 400.0000 mg | ORAL_TABLET | Freq: Two times a day (BID) | ORAL | 1 refills | Status: DC

## 2022-03-25 ENCOUNTER — Ambulatory Visit: Payer: Medicare PPO | Admitting: Cardiology

## 2022-03-25 DIAGNOSIS — M1711 Unilateral primary osteoarthritis, right knee: Secondary | ICD-10-CM | POA: Diagnosis not present

## 2022-03-27 ENCOUNTER — Other Ambulatory Visit: Payer: Self-pay | Admitting: Cardiology

## 2022-03-27 DIAGNOSIS — Z7901 Long term (current) use of anticoagulants: Secondary | ICD-10-CM

## 2022-03-27 DIAGNOSIS — I480 Paroxysmal atrial fibrillation: Secondary | ICD-10-CM

## 2022-04-13 DIAGNOSIS — Z299 Encounter for prophylactic measures, unspecified: Secondary | ICD-10-CM | POA: Diagnosis not present

## 2022-04-13 DIAGNOSIS — R0981 Nasal congestion: Secondary | ICD-10-CM | POA: Diagnosis not present

## 2022-04-13 DIAGNOSIS — J069 Acute upper respiratory infection, unspecified: Secondary | ICD-10-CM | POA: Diagnosis not present

## 2022-04-14 DIAGNOSIS — E1165 Type 2 diabetes mellitus with hyperglycemia: Secondary | ICD-10-CM | POA: Diagnosis not present

## 2022-04-14 DIAGNOSIS — I10 Essential (primary) hypertension: Secondary | ICD-10-CM | POA: Diagnosis not present

## 2022-04-16 ENCOUNTER — Encounter: Payer: Self-pay | Admitting: Cardiology

## 2022-04-16 ENCOUNTER — Ambulatory Visit: Payer: Medicare PPO | Admitting: Cardiology

## 2022-04-16 VITALS — BP 129/73 | HR 69 | Resp 16 | Ht 78.0 in | Wt 191.2 lb

## 2022-04-16 DIAGNOSIS — I10 Essential (primary) hypertension: Secondary | ICD-10-CM

## 2022-04-16 DIAGNOSIS — Z8679 Personal history of other diseases of the circulatory system: Secondary | ICD-10-CM

## 2022-04-16 DIAGNOSIS — I2510 Atherosclerotic heart disease of native coronary artery without angina pectoris: Secondary | ICD-10-CM

## 2022-04-16 DIAGNOSIS — E1129 Type 2 diabetes mellitus with other diabetic kidney complication: Secondary | ICD-10-CM | POA: Diagnosis not present

## 2022-04-16 DIAGNOSIS — I5022 Chronic systolic (congestive) heart failure: Secondary | ICD-10-CM

## 2022-04-16 DIAGNOSIS — Z951 Presence of aortocoronary bypass graft: Secondary | ICD-10-CM

## 2022-04-16 DIAGNOSIS — I447 Left bundle-branch block, unspecified: Secondary | ICD-10-CM | POA: Diagnosis not present

## 2022-04-16 DIAGNOSIS — Z7901 Long term (current) use of anticoagulants: Secondary | ICD-10-CM | POA: Diagnosis not present

## 2022-04-16 DIAGNOSIS — E782 Mixed hyperlipidemia: Secondary | ICD-10-CM

## 2022-04-16 DIAGNOSIS — Z79899 Other long term (current) drug therapy: Secondary | ICD-10-CM | POA: Diagnosis not present

## 2022-04-16 DIAGNOSIS — Z9889 Other specified postprocedural states: Secondary | ICD-10-CM | POA: Diagnosis not present

## 2022-04-16 DIAGNOSIS — I480 Paroxysmal atrial fibrillation: Secondary | ICD-10-CM | POA: Diagnosis not present

## 2022-04-16 DIAGNOSIS — Z87891 Personal history of nicotine dependence: Secondary | ICD-10-CM

## 2022-04-16 DIAGNOSIS — I255 Ischemic cardiomyopathy: Secondary | ICD-10-CM | POA: Diagnosis not present

## 2022-04-16 DIAGNOSIS — I350 Nonrheumatic aortic (valve) stenosis: Secondary | ICD-10-CM

## 2022-04-16 NOTE — Progress Notes (Signed)
ID:  Derek Bond, DOB 1945/07/11, MRN [id number]  PCP:  Glenda Chroman, MD  Cardiologist:  Rex Kras, DO, [place] (established care 01/26/2021)  Date: 04/16/22 Last Office Visit: 12/24/2021  Chief Complaint  Patient presents with   Coronary Artery Disease   Atrial Fibrillation   Follow-up    3 month    HPI  Derek Bond is a 76 y.o. male whose past medical history and cardiovascular risk factors include: Ischemic cardiomyopathy, acute/chronic heart failure with reduced EF, CAD s/p CABG / Clipping of atrial appendage using 28m atrial clip, aortic stenosis, hypertension, diabetes mellitus with microalbuminuria, erectile dysfunction, heartburn, former smoker, hyperlipidemia, sleep apnea not on CPAP, paroxysmal atrial fibrillation status post Maze procedure and DDCV (06/10/2021), advanced age.  He is referred to the office at the request of VJerene BearsB, MD for evaluation of shortness of breath.  In September 2022 he presented with symptoms of progressive dyspnea initially thought to be secondary to atrial fibrillation.  However, due to recurrent dyspnea  and angina he was recommended to undergo heart catheterization.  Patient was found to have multivessel CAD and subsequently underwent four-vessel bypass, left atrial Maze procedure, and 45 mm atrial clip placement and he was discharged home on 03/04/2021. He underwent TEE guided cardioversion on 06/12/2021 which restored NSR and he was placed on antiarrhythmic medications. During his last hospitalization in July 2023 he underwent stress test which was reported to be intermediate risk and uptitrated as an anginal therapy.   Since last office visit patient is doing well from a cardiovascular standpoint.  He denies anginal discomfort or heart failure symptoms.  He has required 1-2 tablets of sublingual nitroglycerin tablets over the last 3 months.  He is currently having upper respiratory like symptoms.  ALLERGIES: Allergies  Allergen  Reactions   Bee Venom Swelling    MEDICATION LIST PRIOR TO VISIT: Current Meds  Medication Sig   acetaminophen (TYLENOL) 500 MG tablet Take 1,000 mg by mouth every 6 (six) hours as needed for moderate pain or mild pain.   aspirin EC 81 MG tablet Take 81 mg by mouth daily.   dronedarone (MULTAQ) 400 MG tablet Take 1 tablet (400 mg total) by mouth 2 (two) times daily with a meal.   ELIQUIS 5 MG TABS tablet TAKE 1 TABLET (5 MG TOTAL) BY MOUTH 2 (TWO) TIMES DAILY.   ENTRESTO 97-103 MG TAKE 1 TABLET TWICE DAILY   famotidine (PEPCID) 40 MG tablet Take 40 mg by mouth at bedtime.   FARXIGA 10 MG TABS tablet TAKE 1 TABLET EVERY DAY   folic acid (FOLVITE) 1 MG tablet Take 2 mg by mouth daily.   glipiZIDE (GLUCOTROL XL) 10 MG 24 hr tablet Take 10 mg by mouth 2 (two) times daily.   magnesium oxide (MAG-OX) 400 (240 Mg) MG tablet TAKE 1 TABLET EVERY DAY   metFORMIN (GLUCOPHAGE) 1000 MG tablet Take 1,000 mg by mouth daily with breakfast.   methotrexate (RHEUMATREX) 2.5 MG tablet Take 2.5 mg by mouth as directed.   metoprolol succinate (TOPROL-XL) 25 MG 24 hr tablet Take 1 tablet (25 mg total) by mouth every morning.   nitroGLYCERIN (NITROSTAT) 0.4 MG SL tablet Place under the tongue.   pantoprazole (PROTONIX) 40 MG tablet Take 40 mg by mouth every evening.   potassium chloride (KLOR-CON M) 10 MEQ tablet TAKE 1 TABLET (10 MEQ TOTAL) BY MOUTH DAILY.   ranolazine (RANEXA) 500 MG 12 hr tablet Take 1 tablet (500 mg total) by  mouth 2 (two) times daily.   rosuvastatin (CRESTOR) 5 MG tablet Take 5 mg by mouth at bedtime.     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    Due to laceration at age 61    Hiatal hernia    History of melanoma    Obstructive sleep apnea    Type 2  diabetes mellitus ([place])     PAST SURGICAL HISTORY: Past Surgical History:  Procedure Laterality Date   BIOPSY  12/21/2019   Procedure: BIOPSY;  Surgeon: Harvel Quale, MD;  [facility];  Service: Gastroenterology;;  random colon   CARDIOVERSION N/A 06/10/2021   Procedure: CARDIOVERSION;  Surgeon: Rex Kras, DO;  [facility];  Service: Cardiovascular;  Laterality: N/A;   CLIPPING OF ATRIAL APPENDAGE N/A 02/26/2021   Procedure: CLIPPING OF ATRIAL APPENDAGE USING ATRICURE CLIP SIZE 64;  Surgeon: Lajuana Matte, MD;  Location: [place];  Service: Open Heart Surgery;  Laterality: N/A;   COLONOSCOPY WITH PROPOFOL N/A 12/21/2019   Procedure: COLONOSCOPY WITH PROPOFOL;  Surgeon: Harvel Quale, MD;  [facility];  Service: Gastroenterology;  Laterality: N/A;  915   CORONARY ARTERY BYPASS GRAFT N/A 02/26/2021   Procedure: CORONARY ARTERY BYPASS GRAFTING (CABG) X4, ON PUMP, USING LEFT INTERNAL MAMMARY ARTERY AND RIGHT ENDOSCOPICALLY HARVESTED GREATER SAPHENOUS VEIN;  Surgeon: Lajuana Matte, MD;  Location: [place];  Service: Open Heart Surgery;  Laterality: N/A;   ENDOVEIN HARVEST OF GREATER SAPHENOUS VEIN Right 02/26/2021   Procedure: ENDOVEIN HARVEST OF GREATER SAPHENOUS VEIN;  Surgeon: Lajuana Matte, MD;  Location: [place];  Service: Open Heart Surgery;  Laterality: Right;   MAZE N/A 02/26/2021   Procedure: MAZE;  Surgeon: Lajuana Matte, MD;  Location: [place];  Service: Open Heart Surgery;  Laterality: N/A;   MELANOMA EXCISION     Left facial region   REPLACEMENT TOTAL KNEE Left    RIGHT/LEFT HEART CATH AND CORONARY ANGIOGRAPHY N/A 02/23/2021   Procedure: RIGHT/LEFT HEART CATH AND CORONARY ANGIOGRAPHY;  Surgeon: Nigel Mormon, MD;  Location: [place] [facility];  Service: Cardiovascular;  Laterality: N/A;   TEE WITHOUT CARDIOVERSION N/A 02/26/2021   Procedure: TRANSESOPHAGEAL ECHOCARDIOGRAM (TEE);  Surgeon: Lajuana Matte, MD;  Location: [place];  Service: Open Heart Surgery;  Laterality: N/A;   TEE WITHOUT CARDIOVERSION N/A 06/10/2021   Procedure: TRANSESOPHAGEAL ECHOCARDIOGRAM (TEE);  Surgeon: Rex Kras, DO;  [facility];  Service: Cardiovascular;  Laterality: N/A;   UMBILICAL HERNIA REPAIR      FAMILY HISTORY: The patient family history includes Diabetes in his brother, mother, and another family member; Heart attack in his brother and brother; Heart disease in his sister; Hypertension in his brother, brother, sister, sister, and another family member; Lung cancer in his father and sister; Rheum arthritis in an other family member; Stroke in an other family member.  SOCIAL HISTORY:  The patient  reports that he quit smoking about 32 years ago. His smoking use included cigarettes. He has a 60.00 pack-year smoking history. He has never used smokeless tobacco. He reports that he does not currently use alcohol. He reports that he does not use drugs.  REVIEW OF SYSTEMS: Review of Systems  Constitutional:  Positive for weight gain.  Cardiovascular:  Negative for chest pain, claudication, dyspnea on exertion, irregular heartbeat, leg swelling, near-syncope, orthopnea, palpitations, paroxysmal nocturnal dyspnea and syncope.  Respiratory:  Negative for shortness of breath.   Hematologic/Lymphatic: Negative for bleeding problem.  Musculoskeletal:  Negative for muscle cramps and myalgias.  Neurological:  Negative for dizziness and light-headedness.    PHYSICAL EXAM:    04/16/2022   10:11 AM 12/24/2021    2:20 PM 12/14/2021   11:53 AM  Vitals with BMI  Height '6\' 6"'$  '6\' 6"'$    Weight 191 lbs 3 oz 189 lbs   BMI 63.7 85.88   Systolic 502 774 128  Diastolic 73 69 88  Pulse 69 68 79    CONSTITUTIONAL: Well-developed and well-nourished. No acute distress.  SKIN: Skin is warm and dry. No rash noted. No cyanosis. No pallor. No jaundice HEAD: Normocephalic and atraumatic.  EYES: No scleral  icterus MOUTH/THROAT: Moist oral membranes.  NECK: No JVD present. No thyromegaly noted. No carotid bruits  CHEST Normal respiratory effort. No intercostal retractions.  Sternotomy site is healing well. LUNGS: Clear to auscultation bilaterally.  No stridor. No wheezes. No rales.  CARDIOVASCULAR: Regular, S1-S2, +3/6 SEM 2RICS, no rubs or gallops appreciated secondary to tachycardia. ABDOMINAL: Soft, nontender, nondistended, positive bowel sounds in all 4 quadrants, no apparent ascites.  EXTREMITIES: No pitting edema HEMATOLOGIC: No significant bruising NEUROLOGIC: Oriented to person, place, and time. Nonfocal. Normal muscle tone.  PSYCHIATRIC: Normal mood and affect. Normal behavior. Cooperative  CARDIAC DATABASE: 02/26/2021: CABG x4 (LIMA to LAD, SVG to DIAGONAL, SVG to OM, SVG to PLB) + MAZE + 45 mm atrial clip placement.  06/10/2021: TEE guided cardioversion for restoration of normal sinus rhythm with synchronized biphasic 200 J x 2  EKG: 01/26/2021: Atrial fibrillation with rapid ventricular rate 126 bpm, left axis deviation, left anterior fascicular block, left bundle branch block, consider old inferior infarct.  05/19/2021: Atrial fibrillation, 110 bpm, left axis, LBBB.  06/23/2021: NSR, 80bpm, LBBB, frequent PVC.  09/22/2021: Normal sinus rhythm, 81 bpm, left axis, left bundle branch block, without underlying ischemia or injury pattern.  04/16/22: Sinus rhythm, 66 bpm, left axis, left bundle branch block.  Echocardiogram: 02/05/2021:  Severely depressed LV systolic function with visual EF 25-30%. Hypokinetic global wall motion with regionality involving septal wall. Left ventricle cavity is mildly dilated. Moderate left ventricular hypertrophy. Unable to evaluate diastolic function due to atrial fibrillation. Elevated LAP.  Moderate (Grade II) mitral regurgitation.  Moderate tricuspid regurgitation. Mild pulmonary hypertension, RVSP 76mHg.  IVC is dilated with a respiratory  response of <50%.  No prior study for comparison.  02/25/2021:  1. Left ventricular ejection fraction, by estimation, is 30 to 35%. The left ventricle has moderately decreased function. The left ventricle demonstrates global hypokinesis. There is moderate left ventricular hypertrophy. Left ventricular diastolic function could not be evaluated.   2. Right ventricular systolic function is normal. The right ventricular size is mildly enlarged. There is normal pulmonary artery systolic pressure.   3. Left atrial size was severely dilated.   4. Right atrial size was dilated.   5. The mitral valve is normal in structure. Mild mitral valve regurgitation. No evidence of mitral stenosis.   6. The aortic valve is tricuspid. Aortic valve regurgitation is not visualized.   7. There is mild dilatation of the ascending aorta, measuring 40 mm.   12/13/2021: LVEF 40-45%, G1DD, moderate AS, proximal As Ao 467m estimated RAP 37m70m.   Stress Testing: MPI 12/14/2021 1.  No reversible perfusion defects identified. Small in size, mild in severity fixed defect is noted involving the basal inferior and mid inferior segments compatible with infarction involving the RCA.  Medium in size, mild in severity fixed defect involves the apical septal, apical inferior and apicolateral and apical segments. This is compatible with infarct involving the LAD.   2. Moderate apical and inferior hypokinesis with paradoxical motion of the septum. No left ventricular dilatation.   3. Left ventricular ejection fraction 40%, decreased from 55% on the previous exam.   4. Non invasive risk stratification*: Intermediate  Heart Catheterization: Left and right heart catheterization 02/23/2021 LM: Normal LAD: Prox 80% stenosius at bifurcation with small to medium caliber D1 with 75% long prox disease. Mid 70% disease Ramus: Minimal luminal irregularities Lcx: OM1 with prox 80% stenosis RCA: 60-70% disease in PL branches   RA: 2  mmHg RV: 22/0 mmHg PA: 21/5 mmHg, mPAP 13 mmHg PCW: 8 mmHg   CO: 5.6 L/min CI: 2.5 L/min/m2     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%, diabetic patient CVTS consult for CABG  LABORATORY DATA:    Latest Ref Rng & Units 12/13/2021    7:50 AM 12/12/2021    4:28 PM 10/05/2021   11:11 AM  CBC  WBC 4.0 - 10.5 K/uL 5.9  6.4    Hemoglobin 13.0 - 17.0 g/dL 14.3  14.5  14.4   Hematocrit 39.0 - 52.0 % 42.8  44.0  44.3   Platelets 150 - 400 K/uL 202  210         Latest Ref Rng & Units 12/13/2021    7:50 AM 12/12/2021    5:04 PM 12/12/2021    4:28 PM  CMP  Glucose 70 - 99 mg/dL 222   221   BUN 8 - 23 mg/dL 15   19   Creatinine 0.61 - 1.24 mg/dL 0.69   0.77   Sodium 135 - 145 mmol/L 137   136   Potassium 3.5 - 5.1 mmol/L 3.8   3.9   Chloride 98 - 111 mmol/L 107   106   CO2 22 - 32 mmol/L 24   22   Calcium 8.9 - 10.3 mg/dL 9.3   9.3   Total Protein 6.5 - 8.1 g/dL  6.6    Total Bilirubin 0.3 - 1.2 mg/dL  0.8    Alkaline Phos 38 - 126 U/L  80    AST 15 - 41 U/L  20    ALT 0 - 44 U/L  18      Lipid Panel     Component Value Date/Time   CHOL 120 12/13/2021 1035   TRIG 59 12/13/2021 1035   HDL 50 12/13/2021 1035   CHOLHDL 2.4 12/13/2021 1035   VLDL 12 12/13/2021 1035   LDLCALC 58 12/13/2021 1035   LDLDIRECT 54 12/13/2021 1035    No components found for: "NTPROBNP" Recent Labs    05/12/21 1346 10/05/21 1111  PROBNP 1,668* 455   Recent Labs    05/12/21 1346 10/05/21 1111 12/13/21 0750  TSH 3.800 2.500 2.403    BMP Recent Labs    10/05/21 1111 12/12/21 1628 12/13/21 0750  NA 141 136 137  K 4.6 3.9 3.8  CL 105 106 107  CO2 '23 22 24  '$ GLUCOSE 228* 221* 222*  BUN '21 19 15  '$ CREATININE 0.77 0.77 0.69  CALCIUM 9.9 9.3 9.3  GFRNONAA  --  >60 >60    HEMOGLOBIN A1C Lab Results  Component Value Date  HGBA1C 8.7 (H) 12/13/2021   MPG 202.99 12/13/2021    IMPRESSION:    ICD-10-CM   1. Paroxysmal atrial fibrillation (HCC)  I48.0 EKG 12-Lead    2.  S/P Maze operation for atrial fibrillation  Z98.890    Z86.79     3. Long term current use of antiarrhythmic drug  Z79.899     4. Long term (current) use of anticoagulants  Z79.01     5. 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 Atherosclerosis of native coronary artery of native heart without angina pectoris  I25.10     7. S/P CABG x 4  Z95.1     8. LBBB (left bundle branch block)  I44.7     9. Ischemic cardiomyopathy  I25.5     10. Nonrheumatic aortic valve stenosis  I35.0 PCV ECHOCARDIOGRAM COMPLETE    11. Type 2 diabetes mellitus with microalbuminuria, without long-term current use of insulin (HCC)  E11.29    R80.9     12. Mixed hyperlipidemia  E78.2     13. Benign hypertension  I10     14. Former smoker  Z87.891         RECOMMENDATIONS: Derek Bond is a 76 y.o. male whose past medical history and cardiac risk factors include: Ischemic cardiomyopathy, acute/chronic heart failure with reduced EF, hypertension, diabetes mellitus with microalbuminuria, erectile dysfunction, heartburn, former smoker, hyperlipidemia, sleep apnea not on CPAP, persistent atrial fibrillation status post Maze procedure, advanced age.  Paroxysmal atrial fibrillation ([place]), s/p Maze, Long term current use of antiarrhythmic & anticoagulation, s/p TEE/DCCV Currently normal sinus rhythm. Rate control: Metoprolol. Rhythm control: Multaq Thromboembolic prophylaxis: Eliquis. CHA2DS2-VASc SCORE is 5 which correlates to 6.7% risk of stroke per year.  Successfully underwent TEE guided cardioversion 05/2021. Does not endorse bleeding. Transitioned to [facility] during his hospitalization in July 2023.  Chronic HFrEF (heart failure with reduced ejection fraction) (HCC) Stage C, NYHA class II. Echo 12/13/2021: 45-80%, grade 1 diastolic impairment, other details noted above Medications reconciled. No changes warranted at this time. Euvolemic on physical  examination.  Atherosclerosis of native coronary artery of native heart without angina pectoris, S/P CABG x 4, ICMP, LBBB Hospitalized in July 2023 with symptoms consistent with stable angina. Last MPI in July 2023 - no reversible defect.  Currently symptom-free. Tolerated Ranexa well without any side effects or intolerances. Has used couple pills of sublingual nitroglycerin tablets since last visit.  Educated on importance of secondary prevention and improving his modifiable cardiovascular risk factors.  Aortic stenosis: Asymptomatic. Moderate intensity as per the last echocardiogram. Denies symptoms of angina pectoris, heart failure, or syncope Plan echo prior to next visit.  Monitor for now  Type 2 diabetes mellitus with microalbuminuria, without long-term current use of insulin ([place]) Educated on the importance of glycemic control. Currently on Entresto, Farxiga, statin therapy  Mixed hyperlipidemia Currently on rosuvastatin.   He denies myalgia or other side effects. Currently being managed by PCP according to the patient.  Benign hypertension Office blood pressures are very well controlled. Medications reconciled. No changes warranted.  FINAL MEDICATION LIST END OF ENCOUNTER: No orders of the defined types were placed in this encounter.  Medications Discontinued During This Encounter  Medication Reason   traMADol (ULTRAM) 50 MG tablet     Current Outpatient Medications:    acetaminophen (TYLENOL) 500 MG tablet, Take 1,000 mg by mouth every 6 (six) hours as needed for moderate pain or mild pain., Disp: ,  Rfl:    aspirin EC 81 MG tablet, Take 81 mg by mouth daily., Disp: , Rfl:    dronedarone (MULTAQ) 400 MG tablet, Take 1 tablet (400 mg total) by mouth 2 (two) times daily with a meal., Disp: 180 tablet, Rfl: 1   ELIQUIS 5 MG TABS tablet, TAKE 1 TABLET (5 MG TOTAL) BY MOUTH 2 (TWO) TIMES DAILY., Disp: 180 tablet, Rfl: 10   ENTRESTO 97-103 MG, TAKE 1 TABLET TWICE DAILY,  Disp: 180 tablet, Rfl: 10   famotidine (PEPCID) 40 MG tablet, Take 40 mg by mouth at bedtime., Disp: , Rfl:    FARXIGA 10 MG TABS tablet, TAKE 1 TABLET EVERY DAY, Disp: 90 tablet, Rfl: 0   folic acid (FOLVITE) 1 MG tablet, Take 2 mg by mouth daily., Disp: , Rfl:    glipiZIDE (GLUCOTROL XL) 10 MG 24 hr tablet, Take 10 mg by mouth 2 (two) times daily., Disp: , Rfl:    magnesium oxide (MAG-OX) 400 (240 Mg) MG tablet, TAKE 1 TABLET EVERY DAY, Disp: 90 tablet, Rfl: 10   metFORMIN (GLUCOPHAGE) 1000 MG tablet, Take 1,000 mg by mouth daily with breakfast., Disp: , Rfl:    methotrexate (RHEUMATREX) 2.5 MG tablet, Take 2.5 mg by mouth as directed., Disp: , Rfl:    metoprolol succinate (TOPROL-XL) 25 MG 24 hr tablet, Take 1 tablet (25 mg total) by mouth every morning., Disp: 30 tablet, Rfl: 0   nitroGLYCERIN (NITROSTAT) 0.4 MG SL tablet, Place under the tongue., Disp: , Rfl:    pantoprazole (PROTONIX) 40 MG tablet, Take 40 mg by mouth every evening., Disp: , Rfl:    potassium chloride (KLOR-CON M) 10 MEQ tablet, TAKE 1 TABLET (10 MEQ TOTAL) BY MOUTH DAILY., Disp: 90 tablet, Rfl: 0   ranolazine (RANEXA) 500 MG 12 hr tablet, Take 1 tablet (500 mg total) by mouth 2 (two) times daily., Disp: 180 tablet, Rfl: 3   rosuvastatin (CRESTOR) 5 MG tablet, Take 5 mg by mouth at bedtime., Disp: , Rfl:   Orders Placed This Encounter  Procedures   EKG 12-Lead   PCV ECHOCARDIOGRAM COMPLETE    There are no Patient Instructions on file for this visit.   --Continue cardiac medications as reconciled in final medication list. --Return in about 3 months (around 07/16/2022) for Follow up, Aoritc stenosis, Echo, Afib, CMP. Or sooner if needed. --Continue follow-up with your primary care physician regarding the management of your other chronic comorbid conditions.  Patient's questions and concerns were addressed to his satisfaction. He voices understanding of the instructions provided during this encounter.   This note was  created using a voice recognition software as a result there may be grammatical errors inadvertently enclosed that do not reflect the nature of this encounter. Every attempt is made to correct such errors.  Rex Kras, [place], [facility]  Pager: [phone] Office: [phone]

## 2022-05-01 ENCOUNTER — Other Ambulatory Visit: Payer: Self-pay | Admitting: Cardiology

## 2022-05-01 DIAGNOSIS — I5022 Chronic systolic (congestive) heart failure: Secondary | ICD-10-CM

## 2022-05-14 DIAGNOSIS — I10 Essential (primary) hypertension: Secondary | ICD-10-CM | POA: Diagnosis not present

## 2022-05-14 DIAGNOSIS — E1165 Type 2 diabetes mellitus with hyperglycemia: Secondary | ICD-10-CM | POA: Diagnosis not present

## 2022-06-14 DIAGNOSIS — E1165 Type 2 diabetes mellitus with hyperglycemia: Secondary | ICD-10-CM | POA: Diagnosis not present

## 2022-06-14 DIAGNOSIS — I10 Essential (primary) hypertension: Secondary | ICD-10-CM | POA: Diagnosis not present

## 2022-06-17 ENCOUNTER — Telehealth: Payer: Self-pay

## 2022-06-17 NOTE — Telephone Encounter (Signed)
Yes. Please refill.   Derek Vaden Loomis, DO, [facility]

## 2022-06-17 NOTE — Telephone Encounter (Signed)
Pt needing a refill for Multaq, are you okay with me sending this?

## 2022-06-18 ENCOUNTER — Other Ambulatory Visit: Payer: Self-pay

## 2022-06-18 MED ORDER — MULTAQ 400 MG PO TABS: 400.0000 mg | ORAL_TABLET | Freq: Two times a day (BID) | ORAL | 1 refills | Status: DC

## 2022-06-18 NOTE — Telephone Encounter (Signed)
done 

## 2022-06-29 ENCOUNTER — Ambulatory Visit: Payer: Medicare PPO

## 2022-06-29 ENCOUNTER — Other Ambulatory Visit: Payer: Medicare PPO

## 2022-06-29 DIAGNOSIS — I350 Nonrheumatic aortic (valve) stenosis: Secondary | ICD-10-CM | POA: Diagnosis not present

## 2022-06-30 ENCOUNTER — Other Ambulatory Visit: Payer: Medicare PPO

## 2022-06-30 DIAGNOSIS — E1165 Type 2 diabetes mellitus with hyperglycemia: Secondary | ICD-10-CM | POA: Diagnosis not present

## 2022-06-30 DIAGNOSIS — M545 Low back pain, unspecified: Secondary | ICD-10-CM | POA: Diagnosis not present

## 2022-06-30 DIAGNOSIS — R5383 Other fatigue: Secondary | ICD-10-CM | POA: Diagnosis not present

## 2022-06-30 DIAGNOSIS — I10 Essential (primary) hypertension: Secondary | ICD-10-CM | POA: Diagnosis not present

## 2022-06-30 DIAGNOSIS — I5022 Chronic systolic (congestive) heart failure: Secondary | ICD-10-CM | POA: Diagnosis not present

## 2022-06-30 DIAGNOSIS — Z299 Encounter for prophylactic measures, unspecified: Secondary | ICD-10-CM | POA: Diagnosis not present

## 2022-06-30 DIAGNOSIS — Z79899 Other long term (current) drug therapy: Secondary | ICD-10-CM | POA: Diagnosis not present

## 2022-07-07 ENCOUNTER — Other Ambulatory Visit: Payer: Self-pay | Admitting: Cardiology

## 2022-07-07 DIAGNOSIS — I5022 Chronic systolic (congestive) heart failure: Secondary | ICD-10-CM

## 2022-07-09 ENCOUNTER — Other Ambulatory Visit: Payer: Self-pay

## 2022-07-09 DIAGNOSIS — I447 Left bundle-branch block, unspecified: Secondary | ICD-10-CM

## 2022-07-09 DIAGNOSIS — I480 Paroxysmal atrial fibrillation: Secondary | ICD-10-CM

## 2022-07-09 DIAGNOSIS — I5022 Chronic systolic (congestive) heart failure: Secondary | ICD-10-CM

## 2022-07-09 NOTE — Progress Notes (Signed)
Order labs.

## 2022-07-09 NOTE — Progress Notes (Signed)
Patient is aware. Labs ordered.

## 2022-07-14 DIAGNOSIS — E1165 Type 2 diabetes mellitus with hyperglycemia: Secondary | ICD-10-CM | POA: Diagnosis not present

## 2022-07-14 DIAGNOSIS — I10 Essential (primary) hypertension: Secondary | ICD-10-CM | POA: Diagnosis not present

## 2022-07-15 ENCOUNTER — Other Ambulatory Visit: Payer: Self-pay | Admitting: Cardiology

## 2022-07-15 DIAGNOSIS — I5022 Chronic systolic (congestive) heart failure: Secondary | ICD-10-CM

## 2022-07-15 DIAGNOSIS — I480 Paroxysmal atrial fibrillation: Secondary | ICD-10-CM | POA: Diagnosis not present

## 2022-07-15 DIAGNOSIS — I447 Left bundle-branch block, unspecified: Secondary | ICD-10-CM | POA: Diagnosis not present

## 2022-07-16 LAB — BASIC METABOLIC PANEL
BUN/Creatinine Ratio: 22 (ref 10–24)
BUN: 20 mg/dL (ref 8–27)
CO2: 21 mmol/L (ref 20–29)
Calcium: 10 mg/dL (ref 8.6–10.2)
Chloride: 103 mmol/L (ref 96–106)
Creatinine, Ser: 0.93 mg/dL (ref 0.76–1.27)
Glucose: 249 mg/dL — ABNORMAL HIGH (ref 70–99)
Potassium: 4.9 mmol/L (ref 3.5–5.2)
Sodium: 140 mmol/L (ref 134–144)
eGFR: 85 mL/min/{1.73_m2} (ref >59)

## 2022-07-16 LAB — MAGNESIUM: Magnesium: 1.9 mg/dL (ref 1.6–2.3)

## 2022-07-16 LAB — PRO B NATRIURETIC PEPTIDE: NT-Pro BNP: 287 pg/mL (ref 0–486)

## 2022-07-19 ENCOUNTER — Encounter: Payer: Self-pay | Admitting: Cardiology

## 2022-07-19 ENCOUNTER — Ambulatory Visit: Payer: Medicare PPO | Admitting: Cardiology

## 2022-07-19 VITALS — BP 147/84 | HR 76 | Ht 78.0 in | Wt 196.2 lb

## 2022-07-19 DIAGNOSIS — E782 Mixed hyperlipidemia: Secondary | ICD-10-CM

## 2022-07-19 DIAGNOSIS — I5022 Chronic systolic (congestive) heart failure: Secondary | ICD-10-CM

## 2022-07-19 DIAGNOSIS — I480 Paroxysmal atrial fibrillation: Secondary | ICD-10-CM

## 2022-07-19 DIAGNOSIS — Z87891 Personal history of nicotine dependence: Secondary | ICD-10-CM | POA: Diagnosis not present

## 2022-07-19 DIAGNOSIS — I2510 Atherosclerotic heart disease of native coronary artery without angina pectoris: Secondary | ICD-10-CM

## 2022-07-19 DIAGNOSIS — Z9889 Other specified postprocedural states: Secondary | ICD-10-CM | POA: Diagnosis not present

## 2022-07-19 DIAGNOSIS — Z79899 Other long term (current) drug therapy: Secondary | ICD-10-CM

## 2022-07-19 DIAGNOSIS — Z7901 Long term (current) use of anticoagulants: Secondary | ICD-10-CM

## 2022-07-19 DIAGNOSIS — I10 Essential (primary) hypertension: Secondary | ICD-10-CM

## 2022-07-19 DIAGNOSIS — Z8679 Personal history of other diseases of the circulatory system: Secondary | ICD-10-CM

## 2022-07-19 DIAGNOSIS — I255 Ischemic cardiomyopathy: Secondary | ICD-10-CM | POA: Diagnosis not present

## 2022-07-19 DIAGNOSIS — Z951 Presence of aortocoronary bypass graft: Secondary | ICD-10-CM

## 2022-07-19 DIAGNOSIS — I4891 Unspecified atrial fibrillation: Secondary | ICD-10-CM | POA: Diagnosis not present

## 2022-07-19 DIAGNOSIS — E1129 Type 2 diabetes mellitus with other diabetic kidney complication: Secondary | ICD-10-CM

## 2022-07-19 DIAGNOSIS — I350 Nonrheumatic aortic (valve) stenosis: Secondary | ICD-10-CM

## 2022-07-19 NOTE — Progress Notes (Signed)
ID:  Derek Bond, DOB [birth year], MRN [id number]  PCP:  Glenda Chroman, MD  Cardiologist:  Rex Kras, DO, [place] (established care 01/26/2021)  Date: 07/19/22 Last Office Visit: 04/16/2022  Chief Complaint  Patient presents with   Atrial Fibrillation   Aortic Stenosis   Follow-up    3 month    HPI  Derek Bond is a 77 y.o. male whose past medical history and cardiovascular risk factors include: Ischemic cardiomyopathy, acute/chronic heart failure with reduced EF, CAD s/p CABG / Clipping of atrial appendage using 67m atrial clip, aortic stenosis, hypertension, diabetes mellitus with microalbuminuria, erectile dysfunction, heartburn, former smoker, hyperlipidemia, sleep apnea not on CPAP, paroxysmal atrial fibrillation status post Maze procedure and DDCV (06/10/2021), advanced age.  He is referred to the office at the request of Derek BearsB, MD for evaluation of shortness of breath.  In September 2022 he presented with symptoms of progressive dyspnea initially thought to be secondary to atrial fibrillation.  However, due to recurrent dyspnea  and angina he underwent angiography & he was found to have multivessel CAD.  He subsequently underwent four-vessel bypass, left atrial Maze procedure, and 45 mm atrial clip placement and he was discharged home on 03/04/2021. He underwent TEE guided cardioversion on 06/12/2021 which restored NSR and he was placed on antiarrhythmic medications. During his last hospitalization in July 2023 he underwent stress test which was reported to be intermediate risk and uptitrated as an anginal therapy.   Since last office visit patient is doing well from a cardiovascular standpoint.  He denies active anginal discomfort.  At times he has precordial discomfort which resolved with 1 sublingual nitroglycerin tablet.  He probably has used couple over the last 3 months.  His appetite has improved and he has gained approximately 5 pounds since last visit due to  diet as opposed to fluid accumulation.  He still feels tired and fatigue at baseline without any significant improvement.  ALLERGIES: Allergies  Allergen Reactions   Bee Venom Swelling    MEDICATION LIST PRIOR TO VISIT: Current Meds  Medication Sig   aspirin EC 81 MG tablet Take 81 mg by mouth daily.   dapagliflozin propanediol (FARXIGA) 10 MG TABS tablet TAKE 1 TABLET EVERY DAY   dronedarone (MULTAQ) 400 MG tablet Take 1 tablet (400 mg total) by mouth 2 (two) times daily with a meal.   ELIQUIS 5 MG TABS tablet TAKE 1 TABLET (5 MG TOTAL) BY MOUTH 2 (TWO) TIMES DAILY.   ENTRESTO 97-103 MG TAKE 1 TABLET TWICE DAILY   famotidine (PEPCID) 40 MG tablet Take 40 mg by mouth at bedtime.   folic acid (FOLVITE) 1 MG tablet Take 2 mg by mouth daily.   glipiZIDE (GLUCOTROL XL) 10 MG 24 hr tablet Take 10 mg by mouth 2 (two) times daily.   magnesium oxide (MAG-OX) 400 (240 Mg) MG tablet TAKE 1 TABLET EVERY DAY   metFORMIN (GLUCOPHAGE) 1000 MG tablet Take 1,000 mg by mouth daily with breakfast.   methotrexate (RHEUMATREX) 2.5 MG tablet Take 2.5 mg by mouth as directed.   metoprolol succinate (TOPROL-XL) 25 MG 24 hr tablet TAKE 1 TABLET EVERY MORNING   nitroGLYCERIN (NITROSTAT) 0.4 MG SL tablet Place under the tongue.   pantoprazole (PROTONIX) 40 MG tablet Take 40 mg by mouth every evening.   potassium chloride (KLOR-CON M) 10 MEQ tablet TAKE 1 TABLET (10 MEQ TOTAL) BY MOUTH DAILY.   ranolazine (RANEXA) 500 MG 12 hr tablet Take 1 tablet (500  mg total) by mouth 2 (two) times daily.   rosuvastatin (CRESTOR) 5 MG tablet Take 5 mg by mouth at bedtime.     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    Due to laceration at age 72    Hiatal hernia    History of melanoma     Obstructive sleep apnea    Type 2 diabetes mellitus ([place])     PAST SURGICAL HISTORY: Past Surgical History:  Procedure Laterality Date   BIOPSY  12/21/2019   Procedure: BIOPSY;  Surgeon: Harvel Quale, MD;  [facility];  Service: Gastroenterology;;  random colon   CARDIOVERSION N/A 06/10/2021   Procedure: CARDIOVERSION;  Surgeon: Rex Kras, DO;  [facility];  Service: Cardiovascular;  Laterality: N/A;   CLIPPING OF ATRIAL APPENDAGE N/A 02/26/2021   Procedure: CLIPPING OF ATRIAL APPENDAGE USING ATRICURE CLIP SIZE 20;  Surgeon: Lajuana Matte, MD;  Location: [place];  Service: Open Heart Surgery;  Laterality: N/A;   COLONOSCOPY WITH PROPOFOL N/A 12/21/2019   Procedure: COLONOSCOPY WITH PROPOFOL;  Surgeon: Harvel Quale, MD;  [facility];  Service: Gastroenterology;  Laterality: N/A;  915   CORONARY ARTERY BYPASS GRAFT N/A 02/26/2021   Procedure: CORONARY ARTERY BYPASS GRAFTING (CABG) X4, ON PUMP, USING LEFT INTERNAL MAMMARY ARTERY AND RIGHT ENDOSCOPICALLY HARVESTED GREATER SAPHENOUS VEIN;  Surgeon: Lajuana Matte, MD;  Location: [place];  Service: Open Heart Surgery;  Laterality: N/A;   ENDOVEIN HARVEST OF GREATER SAPHENOUS VEIN Right 02/26/2021   Procedure: ENDOVEIN HARVEST OF GREATER SAPHENOUS VEIN;  Surgeon: Lajuana Matte, MD;  Location: [place];  Service: Open Heart Surgery;  Laterality: Right;   MAZE N/A 02/26/2021   Procedure: MAZE;  Surgeon: Lajuana Matte, MD;  Location: [place];  Service: Open Heart Surgery;  Laterality: N/A;   MELANOMA EXCISION     Left facial region   REPLACEMENT TOTAL KNEE Left    RIGHT/LEFT HEART CATH AND CORONARY ANGIOGRAPHY N/A 02/23/2021   Procedure: RIGHT/LEFT HEART CATH AND CORONARY ANGIOGRAPHY;  Surgeon: Nigel Mormon, MD;  Location: [place] [facility];  Service: Cardiovascular;  Laterality: N/A;   TEE WITHOUT CARDIOVERSION N/A 02/26/2021   Procedure: TRANSESOPHAGEAL  ECHOCARDIOGRAM (TEE);  Surgeon: Lajuana Matte, MD;  Location: [place];  Service: Open Heart Surgery;  Laterality: N/A;   TEE WITHOUT CARDIOVERSION N/A 06/10/2021   Procedure: TRANSESOPHAGEAL ECHOCARDIOGRAM (TEE);  Surgeon: Rex Kras, DO;  [facility];  Service: Cardiovascular;  Laterality: N/A;   UMBILICAL HERNIA REPAIR      FAMILY HISTORY: The patient family history includes Diabetes in his brother, mother, and another family member; Heart attack in his brother and brother; Heart disease in his sister; Hypertension in his brother, brother, sister, sister, and another family member; Lung cancer in his father and sister; Rheum arthritis in an other family member; Stroke in an other family member.  SOCIAL HISTORY:  The patient  reports that he quit smoking about 33 years ago. His smoking use included cigarettes. He has a 60.00 pack-year smoking history. He has never used smokeless tobacco. He reports that he does not currently use alcohol. He reports that he does not use drugs.  REVIEW OF SYSTEMS: Review of  Systems  Constitutional: Positive for weight gain.  Cardiovascular:  Negative for chest pain, claudication, dyspnea on exertion, irregular heartbeat, leg swelling, near-syncope, orthopnea, palpitations, paroxysmal nocturnal dyspnea and syncope.  Respiratory:  Negative for shortness of breath.   Hematologic/Lymphatic: Negative for bleeding problem.  Musculoskeletal:  Negative for muscle cramps and myalgias.  Neurological:  Positive for dizziness (at times). Negative for light-headedness.    PHYSICAL EXAM:    07/19/2022    9:47 AM 04/16/2022   10:11 AM 12/24/2021    2:20 PM  Vitals with BMI  Height '6\' 6"'$  '6\' 6"'$  '6\' 6"'$   Weight 196 lbs 3 oz 191 lbs 3 oz 189 lbs  BMI 22.68 [id number]  Systolic Q000111Q Q000111Q [id number]  Diastolic 84 73 69  Pulse 76 69 68    CONSTITUTIONAL: Well-developed and well-nourished. No acute distress.  SKIN: Skin is warm and dry. No rash noted. No cyanosis. No  pallor. No jaundice HEAD: Normocephalic and atraumatic.  EYES: No scleral icterus MOUTH/THROAT: Moist oral membranes.  NECK: No JVD present. No thyromegaly noted. No carotid bruits  CHEST Normal respiratory effort. No intercostal retractions.  Sternotomy site is healing well. LUNGS: Clear to auscultation bilaterally.  No stridor. No wheezes. No rales.  CARDIOVASCULAR: Regular, S1-S2, +3/6 SEM 2RICS, no rubs or gallops appreciated secondary to tachycardia. ABDOMINAL: Soft, nontender, nondistended, positive bowel sounds in all 4 quadrants, no apparent ascites.  EXTREMITIES: No pitting edema HEMATOLOGIC: No significant bruising NEUROLOGIC: Oriented to person, place, and time. Nonfocal. Normal muscle tone.  PSYCHIATRIC: Normal mood and affect. Normal behavior. Cooperative  CARDIAC DATABASE: 02/26/2021: CABG x4 (LIMA to LAD, SVG to DIAGONAL, SVG to OM, SVG to PLB) + MAZE + 45 mm atrial clip placement.  06/10/2021: TEE guided cardioversion for restoration of normal sinus rhythm with synchronized biphasic 200 J x 2  EKG: 01/26/2021: Atrial fibrillation with rapid ventricular rate 126 bpm, left axis deviation, left anterior fascicular block, left bundle branch block, consider old inferior infarct.  05/19/2021: Atrial fibrillation, 110 bpm, left axis, LBBB.  07/19/2022: Sinus rhythm, 71 bpm, left axis, left bundle branch block.  Echocardiogram: 02/05/2021:  Severely depressed LV systolic function with visual EF 25-30%. Hypokinetic global wall motion with regionality involving septal wall. Left ventricle cavity is mildly dilated. Moderate left ventricular hypertrophy. Unable to evaluate diastolic function due to atrial fibrillation. Elevated LAP.  Moderate (Grade II) mitral regurgitation.  Moderate tricuspid regurgitation. Mild pulmonary hypertension, RVSP 71mHg.  IVC is dilated with a respiratory response of <50%.  No prior study for comparison.  02/25/2021:  1. Left ventricular ejection  fraction, by estimation, is 30 to 35%. The left ventricle has moderately decreased function. The left ventricle demonstrates global hypokinesis. There is moderate left ventricular hypertrophy. Left ventricular diastolic function could not be evaluated.   2. Right ventricular systolic function is normal. The right ventricular size is mildly enlarged. There is normal pulmonary artery systolic pressure.   3. Left atrial size was severely dilated.   4. Right atrial size was dilated.   5. The mitral valve is normal in structure. Mild mitral valve regurgitation. No evidence of mitral stenosis.   6. The aortic valve is tricuspid. Aortic valve regurgitation is not visualized.   7. There is mild dilatation of the ascending aorta, measuring 40 mm.   12/13/2021: LVEF 40-45%, G1DD, moderate AS, proximal As Ao 448m estimated RAP 41m74m.   06/29/2022: Mildly depressed LV systolic function with visual EF 45-50%. Left ventricle cavity is normal in size. Mild left ventricular  hypertrophy. Normal global wall motion. Abnormal septal wall motion due to post-operative septum. Doppler evidence of grade II (pseudonormal) diastolic dysfunction, normal LAP.  Left atrial cavity is mildly dilated at 37.1 ml/m^2. Native trileaflet aortic valve with no regurgitation. Aortic valve sclerosis with mild to moderate aortic valve stenosis. Peak velocity 2.68ms, Peak Pressure Gradient 32 mmHg, Mean Gradient 14.4 mmHg, AVA 1.4cm, Dimensionless Index 0.4 Mild (Grade I) mitral regurgitation. Moderate tricuspid regurgitation. Mild pulmonary hypertension. RVSP measures 36 mmHg. The aortic root is normal. Proximal ascending aorta dilated, 42 mm. Compared to 12/13/2021 LVEF improved from 40-45% to 45-50%, G1DD is now G2DD, moderate AS is now mild/moderate, proximal As Ao remains stable, mild PHTN is new.   Stress Testing: MPI 12/14/2021 1. No reversible perfusion defects identified. Small in size, mild in severity fixed defect is noted  involving the basal inferior and mid inferior segments compatible with infarction involving the RCA.  Medium in size, mild in severity fixed defect involves the apical septal, apical inferior and apicolateral and apical segments. This is compatible with infarct involving the LAD.   2. Moderate apical and inferior hypokinesis with paradoxical motion of the septum. No left ventricular dilatation.   3. Left ventricular ejection fraction 40%, decreased from 55% on the previous exam.   4. Non invasive risk stratification*: Intermediate  Heart Catheterization: Left and right heart catheterization 02/23/2021 LM: Normal LAD: Prox 80% stenosius at bifurcation with small to medium caliber D1 with 75% long prox disease. Mid 70% disease Ramus: Minimal luminal irregularities Lcx: OM1 with prox 80% stenosis RCA: 60-70% disease in PL branches   RA: 2 mmHg RV: 22/0 mmHg PA: 21/5 mmHg, mPAP 13 mmHg PCW: 8 mmHg   CO: 5.6 L/min CI: 2.5 L/min/m2     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%, diabetic patient CVTS consult for CABG  LABORATORY DATA:    Latest Ref Rng & Units 12/13/2021    7:50 AM 12/12/2021    4:28 PM 10/05/2021   11:11 AM  CBC  WBC 4.0 - 10.5 K/uL 5.9  6.4    Hemoglobin 13.0 - 17.0 g/dL 14.3  14.5  14.4   Hematocrit 39.0 - 52.0 % 42.8  44.0  44.3   Platelets 150 - 400 K/uL 202  210         Latest Ref Rng & Units 07/15/2022   11:12 AM 12/13/2021    7:50 AM 12/12/2021    5:04 PM  CMP  Glucose 70 - 99 mg/dL 249  222    BUN 8 - 27 mg/dL 20  15    Creatinine 0.76 - 1.27 mg/dL 0.93  0.69    Sodium 134 - 144 mmol/L 140  137    Potassium 3.5 - 5.2 mmol/L 4.9  3.8    Chloride 96 - 106 mmol/L 103  107    CO2 20 - 29 mmol/L 21  24    Calcium 8.6 - 10.2 mg/dL 10.0  9.3    Total Protein 6.5 - 8.1 g/dL   6.6   Total Bilirubin 0.3 - 1.2 mg/dL   0.8   Alkaline Phos 38 - 126 U/L   80   AST 15 - 41 U/L   20   ALT 0 - 44 U/L   18     Lipid Panel     Component Value Date/Time    CHOL 120 12/13/2021 1035   TRIG 59 12/13/2021 1035   HDL 50 12/13/2021 1035   CHOLHDL 2.4 12/13/2021 1035   VLDL  12 12/13/2021 1035   LDLCALC 58 12/13/2021 1035   LDLDIRECT 54 12/13/2021 1035    No components found for: "NTPROBNP" Recent Labs    10/05/21 1111 07/15/22 1108  PROBNP 455 287   Recent Labs    10/05/21 1111 12/13/21 0750  TSH 2.500 2.403    BMP Recent Labs    12/12/21 1628 12/13/21 0750 07/15/22 1112  NA 136 137 140  K 3.9 3.8 4.9  CL 106 107 103  CO2 '22 24 21  '$ GLUCOSE 221* 222* 249*  BUN '19 15 20  '$ CREATININE 0.77 0.69 0.93  CALCIUM 9.3 9.3 10.0  GFRNONAA >60 >60  --     HEMOGLOBIN A1C Lab Results  Component Value Date   HGBA1C 8.7 (H) 12/13/2021   MPG 202.99 12/13/2021    IMPRESSION:    ICD-10-CM   1. Ischemic cardiomyopathy  Q000111Q Basic metabolic panel    Pro b natriuretic peptide (BNP)    Magnesium    Hemoglobin and hematocrit, blood    2. Atherosclerosis of native coronary artery of native heart without angina pectoris  I25.10     3. S/P CABG x 4  Z95.1     4. Chronic HFrEF (heart failure with reduced ejection fraction) (HCC)  I50.22     5. Paroxysmal atrial fibrillation (HCC)  I48.0 EKG [id number]    Basic metabolic panel    Hemoglobin and hematocrit, blood    6. S/P Maze operation for atrial fibrillation  Z98.890    Z86.79     7. Long term current use of antiarrhythmic drug  Z79.899     8. Long term (current) use of anticoagulants  Z79.01     9. Nonrheumatic aortic valve stenosis  I35.0     10. Type 2 diabetes mellitus with microalbuminuria, without long-term current use of insulin (HCC)  E11.29    R80.9     11. Mixed hyperlipidemia  E78.2     12. Benign hypertension  I10     13. Former smoker  Z87.891         RECOMMENDATIONS: Kito Lacross is a 77 y.o. male whose past medical history and cardiac risk factors include: Ischemic cardiomyopathy, acute/chronic heart failure with reduced EF, hypertension, diabetes  mellitus with microalbuminuria, erectile dysfunction, heartburn, former smoker, hyperlipidemia, sleep apnea not on CPAP, persistent atrial fibrillation status post Maze procedure, advanced age.  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 Stage C, NYHA class II. Last MPI in July 2023 - no reversible defect.  Most recent echocardiogram notes slight improvement in LVEF at Q000111Q, grade 2 diastolic dysfunction, other details noted above. Clinically he is euvolemic. Recent labs also noted significant improvement in proBNP. Will hold off on up titration of diuretic therapies at this time. Medications reconciled. Prescriptions refilled as requested Strict I's and O's, daily weights. Monitor for now Will check labs prior to next office visit  Paroxysmal atrial fibrillation ([place]) S/P Maze operation for atrial fibrillation Long term current use of antiarrhythmic drug Long term (current) use of anticoagulants EKG: Sinus rhythm. Rate control: Metoprolol. Rhythm control: Multaq. Thromboembolic prophylaxis: Eliquis. CHA2DS2-VASc SCORE is 5 which correlates to 6.7% risk of stroke per year.  Successfully underwent TEE guided cardioversion 05/2021. Does not endorse bleeding. Check H&H and BMP prior to the next office visit.  Nonrheumatic aortic valve stenosis Asymptomatic. Based on the echo severity remains stable  at moderate. Denies anginal discomfort, heart failure exacerbation, or syncope. Monitor for now  Type 2 diabetes mellitus with microalbuminuria, without long-term current use of insulin ([place]) Reemphasized importance of glycemic control. Currently on Entresto, Farxiga, statin therapy  Mixed hyperlipidemia Currently on rosuvastatin.   He denies myalgia or other side effects. Most recent lipids dated July 2023  reviewed as noted above.   FINAL MEDICATION LIST END OF ENCOUNTER: No orders of the defined types were placed in this encounter.  There are no discontinued medications.   Current Outpatient Medications:    aspirin EC 81 MG tablet, Take 81 mg by mouth daily., Disp: , Rfl:    dapagliflozin propanediol (FARXIGA) 10 MG TABS tablet, TAKE 1 TABLET EVERY DAY, Disp: 90 tablet, Rfl: 0   dronedarone (MULTAQ) 400 MG tablet, Take 1 tablet (400 mg total) by mouth 2 (two) times daily with a meal., Disp: 180 tablet, Rfl: 1   ELIQUIS 5 MG TABS tablet, TAKE 1 TABLET (5 MG TOTAL) BY MOUTH 2 (TWO) TIMES DAILY., Disp: 180 tablet, Rfl: 10   ENTRESTO 97-103 MG, TAKE 1 TABLET TWICE DAILY, Disp: 180 tablet, Rfl: 10   famotidine (PEPCID) 40 MG tablet, Take 40 mg by mouth at bedtime., Disp: , Rfl:    folic acid (FOLVITE) 1 MG tablet, Take 2 mg by mouth daily., Disp: , Rfl:    glipiZIDE (GLUCOTROL XL) 10 MG 24 hr tablet, Take 10 mg by mouth 2 (two) times daily., Disp: , Rfl:    magnesium oxide (MAG-OX) 400 (240 Mg) MG tablet, TAKE 1 TABLET EVERY DAY, Disp: 90 tablet, Rfl: 10   metFORMIN (GLUCOPHAGE) 1000 MG tablet, Take 1,000 mg by mouth daily with breakfast., Disp: , Rfl:    methotrexate (RHEUMATREX) 2.5 MG tablet, Take 2.5 mg by mouth as directed., Disp: , Rfl:    metoprolol succinate (TOPROL-XL) 25 MG 24 hr tablet, TAKE 1 TABLET EVERY MORNING, Disp: 90 tablet, Rfl: 3   nitroGLYCERIN (NITROSTAT) 0.4 MG SL tablet, Place under the tongue., Disp: , Rfl:    pantoprazole (PROTONIX) 40 MG tablet, Take 40 mg by mouth every evening., Disp: , Rfl:    potassium chloride (KLOR-CON M) 10 MEQ tablet, TAKE 1 TABLET (10 MEQ TOTAL) BY MOUTH DAILY., Disp: 90 tablet, Rfl: 3   ranolazine (RANEXA) 500 MG 12 hr tablet, Take 1 tablet (500 mg total) by mouth 2 (two) times daily., Disp: 180 tablet, Rfl: 3   rosuvastatin (CRESTOR) 5 MG tablet, Take 5 mg by mouth at bedtime., Disp: , Rfl:    acetaminophen (TYLENOL) 500 MG tablet, Take  1,000 mg by mouth every 6 (six) hours as needed for moderate pain or mild pain., Disp: , Rfl:   Orders Placed This Encounter  Procedures   Basic metabolic panel   Pro b natriuretic peptide (BNP)   Magnesium   Hemoglobin and hematocrit, blood   EKG 12-Lead    There are no Patient Instructions on file for this visit.   --Continue cardiac medications as reconciled in final medication list. --Return in about 3 months (around 10/21/2022) for heart failure management.. Or sooner if needed. --Continue follow-up with your primary care physician regarding the management of your other chronic comorbid conditions.  Patient's questions and concerns were addressed to his satisfaction. He voices understanding of the instructions provided during this encounter.   This note was created using a voice recognition software as a result there may be grammatical errors inadvertently enclosed that do not reflect the nature of this encounter.  Every attempt is made to correct such errors.  Rex Kras, [place], [facility]  Pager:  [phone] Office: [phone]

## 2022-07-29 DIAGNOSIS — M25561 Pain in right knee: Secondary | ICD-10-CM | POA: Diagnosis not present

## 2022-08-14 DIAGNOSIS — I10 Essential (primary) hypertension: Secondary | ICD-10-CM | POA: Diagnosis not present

## 2022-08-14 DIAGNOSIS — E1165 Type 2 diabetes mellitus with hyperglycemia: Secondary | ICD-10-CM | POA: Diagnosis not present

## 2022-08-16 DIAGNOSIS — M6281 Muscle weakness (generalized): Secondary | ICD-10-CM | POA: Diagnosis not present

## 2022-08-16 DIAGNOSIS — R269 Unspecified abnormalities of gait and mobility: Secondary | ICD-10-CM | POA: Diagnosis not present

## 2022-08-19 DIAGNOSIS — R269 Unspecified abnormalities of gait and mobility: Secondary | ICD-10-CM | POA: Diagnosis not present

## 2022-08-19 DIAGNOSIS — M6281 Muscle weakness (generalized): Secondary | ICD-10-CM | POA: Diagnosis not present

## 2022-08-23 DIAGNOSIS — R269 Unspecified abnormalities of gait and mobility: Secondary | ICD-10-CM | POA: Diagnosis not present

## 2022-08-23 DIAGNOSIS — M6281 Muscle weakness (generalized): Secondary | ICD-10-CM | POA: Diagnosis not present

## 2022-08-26 DIAGNOSIS — R269 Unspecified abnormalities of gait and mobility: Secondary | ICD-10-CM | POA: Diagnosis not present

## 2022-08-26 DIAGNOSIS — M6281 Muscle weakness (generalized): Secondary | ICD-10-CM | POA: Diagnosis not present

## 2022-08-30 DIAGNOSIS — R269 Unspecified abnormalities of gait and mobility: Secondary | ICD-10-CM | POA: Diagnosis not present

## 2022-08-30 DIAGNOSIS — M6281 Muscle weakness (generalized): Secondary | ICD-10-CM | POA: Diagnosis not present

## 2022-08-31 ENCOUNTER — Other Ambulatory Visit: Payer: Self-pay

## 2022-09-02 DIAGNOSIS — M6281 Muscle weakness (generalized): Secondary | ICD-10-CM | POA: Diagnosis not present

## 2022-09-02 DIAGNOSIS — R269 Unspecified abnormalities of gait and mobility: Secondary | ICD-10-CM | POA: Diagnosis not present

## 2022-09-14 DIAGNOSIS — E1165 Type 2 diabetes mellitus with hyperglycemia: Secondary | ICD-10-CM | POA: Diagnosis not present

## 2022-09-14 DIAGNOSIS — I10 Essential (primary) hypertension: Secondary | ICD-10-CM | POA: Diagnosis not present

## 2022-09-15 ENCOUNTER — Ambulatory Visit (INDEPENDENT_AMBULATORY_CARE_PROVIDER_SITE_OTHER): Payer: Medicare PPO | Admitting: Pulmonary Disease

## 2022-09-15 ENCOUNTER — Encounter: Payer: Self-pay | Admitting: Pulmonary Disease

## 2022-09-15 VITALS — BP 128/72 | HR 68 | Ht 76.5 in | Wt 198.0 lb

## 2022-09-15 DIAGNOSIS — G4733 Obstructive sleep apnea (adult) (pediatric): Secondary | ICD-10-CM

## 2022-09-15 NOTE — Progress Notes (Signed)
   Subjective:    Patient ID: Derek Bond, male    DOB: 01/30/1946, 77 y.o.   MRN: [id number]  HPI 77 year old man for follow-up of OSA  Diagnosed about 15 years ago,  weighed about 300 pounds. He lost about 100 pounds and then came off his CPAP machine   PMH : HFrEF 30%, CAD s/p CABG / Clipping of atrial appendage 02/2021 ,  hypertension,  diabetes mellitus with microalbuminuria,  erectile dysfunction,  paroxysmal atrial fibrillation status post Maze procedure and DCCV (06/10/2021),   We saw him in 2023, repeated his study which showed severe OSA.  We set him up with CPAP.  He went to [facility].  They gave him a poor fitting mask.  He requested a replacement & the second mask did not work either.  They never fitted him properly.  He was then told that he would have to pay out-of-pocket for his CPAP if his compliance was not good.  He then returned machine. He feels that he was treated very poorly by this DME.  He feels he is resting okay, denies choking or gasping episodes in his sleep Cardiology office visit was reviewed  Significant tests/ events reviewed  10/2021 HST showed severe OSA with AHI 40/ hr & low O2 of 78% - 187 lbs  PFTs 10/2021 show ratio 71, FEV1 66%, FVC 68%, 11% bronchodilator response, TLC normal, DLCO normal  Review of Systems neg for any significant sore throat, dysphagia, itching, sneezing, nasal congestion or excess/ purulent secretions, fever, chills, sweats, unintended wt loss, pleuritic or exertional cp, hempoptysis, orthopnea pnd or change in chronic leg swelling. Also denies presyncope, palpitations, heartburn, abdominal pain, nausea, vomiting, diarrhea or change in bowel or urinary habits, dysuria,hematuria, rash, arthralgias, visual complaints, headache, numbness weakness or ataxia.     Objective:   Physical Exam  Gen. Pleasant, well-nourished, in no distress ENT - no thrush, no pallor/icterus,no post nasal drip Neck: No JVD, no thyromegaly, no  carotid bruits Lungs: no use of accessory muscles, no dullness to percussion, clear without rales or rhonchi  Cardiovascular: Rhythm regular, heart sounds  normal, no murmurs or gallops, no peripheral edema Musculoskeletal: No deformities, no cyanosis or clubbing        Assessment & Plan:

## 2022-09-15 NOTE — Patient Instructions (Signed)
X CPAP titration study  Based on this we will get you the CPAP machine back

## 2022-09-15 NOTE — Assessment & Plan Note (Signed)
He still seems to have severe degree of OSA in spite of weight loss.  Unfortunately DME provider prematurely fitting mask and did not really provide mask fitting unfortunately.  Not sure if he used to be CPAP intolerant, he has tolerated CPAP before. Will schedule formal CPAP titration study given History of heart failure. This will also allow [place] to provide mask fitting.  He would not want to deal with [facility] again and will then contact him with a different DME provider

## 2022-09-22 ENCOUNTER — Other Ambulatory Visit: Payer: Self-pay | Admitting: Cardiology

## 2022-09-23 NOTE — Telephone Encounter (Signed)
Can I refill this? 

## 2022-09-24 NOTE — Telephone Encounter (Signed)
yes

## 2022-09-24 NOTE — Telephone Encounter (Signed)
Can I refill this? 

## 2022-09-26 ENCOUNTER — Other Ambulatory Visit: Payer: Self-pay | Admitting: Cardiology

## 2022-09-26 DIAGNOSIS — I5022 Chronic systolic (congestive) heart failure: Secondary | ICD-10-CM

## 2022-10-05 ENCOUNTER — Ambulatory Visit (HOSPITAL_BASED_OUTPATIENT_CLINIC_OR_DEPARTMENT_OTHER): Payer: Medicare PPO | Attending: Pulmonary Disease | Admitting: Pulmonary Disease

## 2022-10-05 DIAGNOSIS — G4733 Obstructive sleep apnea (adult) (pediatric): Secondary | ICD-10-CM | POA: Insufficient documentation

## 2022-10-08 ENCOUNTER — Other Ambulatory Visit: Payer: Self-pay | Admitting: Cardiology

## 2022-10-08 DIAGNOSIS — I255 Ischemic cardiomyopathy: Secondary | ICD-10-CM | POA: Diagnosis not present

## 2022-10-08 DIAGNOSIS — I4891 Unspecified atrial fibrillation: Secondary | ICD-10-CM | POA: Diagnosis not present

## 2022-10-09 LAB — BASIC METABOLIC PANEL
BUN/Creatinine Ratio: 20 (ref 10–24)
BUN: 16 mg/dL (ref 8–27)
CO2: 22 mmol/L (ref 20–29)
Calcium: 10 mg/dL (ref 8.6–10.2)
Chloride: 103 mmol/L (ref 96–106)
Creatinine, Ser: 0.8 mg/dL (ref 0.76–1.27)
Glucose: 142 mg/dL — ABNORMAL HIGH (ref 70–99)
Potassium: 4.6 mmol/L (ref 3.5–5.2)
Sodium: 140 mmol/L (ref 134–144)
eGFR: 91 mL/min/{1.73_m2} (ref >59)

## 2022-10-09 LAB — HEMOGLOBIN AND HEMATOCRIT, BLOOD
Hematocrit: 44.4 % (ref 37.5–51.0)
Hemoglobin: 14.5 g/dL (ref 13.0–17.7)

## 2022-10-09 LAB — PRO B NATRIURETIC PEPTIDE: NT-Pro BNP: 302 pg/mL (ref 0–486)

## 2022-10-09 LAB — MAGNESIUM: Magnesium: 1.8 mg/dL (ref 1.6–2.3)

## 2022-10-15 DIAGNOSIS — E1165 Type 2 diabetes mellitus with hyperglycemia: Secondary | ICD-10-CM | POA: Diagnosis not present

## 2022-10-15 DIAGNOSIS — I10 Essential (primary) hypertension: Secondary | ICD-10-CM | POA: Diagnosis not present

## 2022-10-19 ENCOUNTER — Other Ambulatory Visit: Payer: Self-pay

## 2022-10-19 ENCOUNTER — Encounter: Payer: Self-pay | Admitting: Cardiology

## 2022-10-19 ENCOUNTER — Ambulatory Visit: Payer: Medicare PPO | Admitting: Cardiology

## 2022-10-19 VITALS — BP 135/76 | HR 72 | Ht 78.0 in | Wt 197.0 lb

## 2022-10-19 DIAGNOSIS — I350 Nonrheumatic aortic (valve) stenosis: Secondary | ICD-10-CM

## 2022-10-19 DIAGNOSIS — E782 Mixed hyperlipidemia: Secondary | ICD-10-CM

## 2022-10-19 DIAGNOSIS — I480 Paroxysmal atrial fibrillation: Secondary | ICD-10-CM | POA: Diagnosis not present

## 2022-10-19 DIAGNOSIS — Z8679 Personal history of other diseases of the circulatory system: Secondary | ICD-10-CM

## 2022-10-19 DIAGNOSIS — I5022 Chronic systolic (congestive) heart failure: Secondary | ICD-10-CM | POA: Diagnosis not present

## 2022-10-19 DIAGNOSIS — I2510 Atherosclerotic heart disease of native coronary artery without angina pectoris: Secondary | ICD-10-CM | POA: Diagnosis not present

## 2022-10-19 DIAGNOSIS — Z7901 Long term (current) use of anticoagulants: Secondary | ICD-10-CM

## 2022-10-19 DIAGNOSIS — I255 Ischemic cardiomyopathy: Secondary | ICD-10-CM | POA: Diagnosis not present

## 2022-10-19 DIAGNOSIS — E1129 Type 2 diabetes mellitus with other diabetic kidney complication: Secondary | ICD-10-CM

## 2022-10-19 DIAGNOSIS — Z79899 Other long term (current) drug therapy: Secondary | ICD-10-CM

## 2022-10-19 DIAGNOSIS — Z951 Presence of aortocoronary bypass graft: Secondary | ICD-10-CM | POA: Diagnosis not present

## 2022-10-19 DIAGNOSIS — G4730 Sleep apnea, unspecified: Secondary | ICD-10-CM

## 2022-10-19 DIAGNOSIS — Z9889 Other specified postprocedural states: Secondary | ICD-10-CM | POA: Diagnosis not present

## 2022-10-19 NOTE — Progress Notes (Signed)
ID:  Derek Bond, DOB 15-Mar-1946, MRN [id number]  PCP:  Ignatius Specking, MD  Cardiologist:  Tessa Lerner, DO, FACC (established care 01/26/2021)  Date: 10/19/22 Last Office Visit: 07/19/2022  Chief Complaint  Patient presents with   Cardiomyopathy   Paroxysmal atrial fibrillation (HCC)   Chronic HFrEF (heart failure with reduced ejection fraction   Follow-up    HPI  [name] is a 77 y.o. male whose past medical history and cardiovascular risk factors include: Ischemic cardiomyopathy, acute/chronic heart failure with reduced EF, CAD s/p CABG / Clipping of atrial appendage using 45mm atrial clip, aortic stenosis, hypertension, diabetes mellitus with microalbuminuria, sleep apnea, erectile dysfunction, heartburn, former smoker, hyperlipidemia, sleep apnea not on CPAP, paroxysmal atrial fibrillation status post Maze procedure and DDCV (06/10/2021), advanced age.  Patient is being followed to the practice given his underlying cardiomyopathy, HFrEF, paroxysmal atrial fibrillation.  In September 2022 he presented with symptoms of progressive dyspnea initially thought to be secondary to atrial fibrillation.  However, due to recurrent dyspnea  and angina he underwent angiography & he was found to have multivessel CAD.  He subsequently underwent four-vessel bypass, left atrial Maze procedure, and 45 mm atrial clip placement and he was discharged home on 03/04/2021. He underwent TEE guided cardioversion on 06/12/2021 which restored NSR and he was placed on antiarrhythmic medications. During his hospitalization in July 2023 he underwent stress test which was reported to be intermediate risk and uptitrated as an anginal therapy.   Patient presents today for 9-month follow-up visit.  He denies anginal chest pain or heart failure symptoms.  He still feels tired and fatigued.  Has been evaluated for sleep apnea and is currently awaiting a device therapy.  He is required 2 sublingual nitroglycerin  tablets over the last 3 months on 2 separate occasions but in retrospect patient states that he probably took it due to anxiety.  Otherwise he remained stable from a cardiovascular standpoint.  ALLERGIES: Allergies  Allergen Reactions   Bee Venom Swelling    MEDICATION LIST PRIOR TO VISIT: Current Meds  Medication Sig   acetaminophen (TYLENOL) 500 MG tablet Take 1,000 mg by mouth every 6 (six) hours as needed for moderate pain or mild pain.   aspirin EC 81 MG tablet Take 81 mg by mouth daily.   ELIQUIS 5 MG TABS tablet TAKE 1 TABLET (5 MG TOTAL) BY MOUTH 2 (TWO) TIMES DAILY.   ENTRESTO 97-103 MG TAKE 1 TABLET TWICE DAILY   famotidine (PEPCID) 40 MG tablet Take 40 mg by mouth at bedtime.   FARXIGA 10 MG TABS tablet TAKE 1 TABLET EVERY DAY   folic acid (FOLVITE) 1 MG tablet Take 2 mg by mouth daily.   glipiZIDE (GLUCOTROL XL) 10 MG 24 hr tablet Take 10 mg by mouth 2 (two) times daily.   magnesium oxide (MAG-OX) 400 (240 Mg) MG tablet TAKE 1 TABLET EVERY DAY   metFORMIN (GLUCOPHAGE) 1000 MG tablet Take 1,000 mg by mouth daily with breakfast.   methotrexate (RHEUMATREX) 2.5 MG tablet Take 2.5 mg by mouth as directed.   metoprolol succinate (TOPROL-XL) 25 MG 24 hr tablet TAKE 1 TABLET EVERY MORNING   MULTAQ 400 MG tablet TAKE 1 TABLET (400 MG TOTAL) BY MOUTH 2 (TWO) TIMES DAILY WITH A MEAL.   nitroGLYCERIN (NITROSTAT) 0.4 MG SL tablet Place under the tongue.   pantoprazole (PROTONIX) 40 MG tablet Take 40 mg by mouth every evening.   potassium chloride (KLOR-CON M) 10 MEQ tablet TAKE  1 TABLET (10 MEQ TOTAL) BY MOUTH DAILY.   ranolazine (RANEXA) 500 MG 12 hr tablet Take 1 tablet (500 mg total) by mouth 2 (two) times daily.   rosuvastatin (CRESTOR) 5 MG tablet Take 5 mg by mouth at bedtime.     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    Due to laceration at age 50    Hiatal hernia    History of melanoma    Obstructive sleep apnea    Type 2 diabetes mellitus (HCC)     PAST SURGICAL HISTORY: Past Surgical History:  Procedure Laterality Date   BIOPSY  12/21/2019   Procedure: BIOPSY;  Surgeon: Dolores Frame, MD;  Location: [facility];  Service: Gastroenterology;;  random colon   CARDIOVERSION N/A 06/10/2021   Procedure: CARDIOVERSION;  Surgeon: Tessa Lerner, DO;  Location: [facility];  Service: Cardiovascular;  Laterality: N/A;   CLIPPING OF ATRIAL APPENDAGE N/A 02/26/2021   Procedure: CLIPPING OF ATRIAL APPENDAGE USING ATRICURE CLIP SIZE 45;  Surgeon: Corliss Skains, MD;  Location: [facility] OR;  Service: Open Heart Surgery;  Laterality: N/A;   COLONOSCOPY WITH PROPOFOL N/A 12/21/2019   Procedure: COLONOSCOPY WITH PROPOFOL;  Surgeon: Dolores Frame, MD;  Location: [facility];  Service: Gastroenterology;  Laterality: N/A;  915   CORONARY ARTERY BYPASS GRAFT N/A 02/26/2021   Procedure: CORONARY ARTERY BYPASS GRAFTING (CABG) X4, ON PUMP, USING LEFT INTERNAL MAMMARY ARTERY AND RIGHT ENDOSCOPICALLY HARVESTED GREATER SAPHENOUS VEIN;  Surgeon: Corliss Skains, MD;  Location: [facility] OR;  Service: Open Heart Surgery;  Laterality: N/A;   ENDOVEIN HARVEST OF GREATER SAPHENOUS VEIN Right 02/26/2021   Procedure: ENDOVEIN HARVEST OF GREATER SAPHENOUS VEIN;  Surgeon: Corliss Skains, MD;  Location: [facility] OR;  Service: Open Heart Surgery;  Laterality: Right;   MAZE N/A 02/26/2021   Procedure: MAZE;  Surgeon: Corliss Skains, MD;  Location: [facility] OR;  Service: Open Heart Surgery;  Laterality: N/A;   MELANOMA EXCISION     Left facial region   REPLACEMENT TOTAL KNEE Left    RIGHT/LEFT HEART CATH AND CORONARY ANGIOGRAPHY N/A 02/23/2021   Procedure: RIGHT/LEFT HEART CATH AND CORONARY ANGIOGRAPHY;  Surgeon: Elder Negus, MD;   Location: [facility];  Service: Cardiovascular;  Laterality: N/A;   TEE WITHOUT CARDIOVERSION N/A 02/26/2021   Procedure: TRANSESOPHAGEAL ECHOCARDIOGRAM (TEE);  Surgeon: Corliss Skains, MD;  Location: [facility] OR;  Service: Open Heart Surgery;  Laterality: N/A;   TEE WITHOUT CARDIOVERSION N/A 06/10/2021   Procedure: TRANSESOPHAGEAL ECHOCARDIOGRAM (TEE);  Surgeon: Tessa Lerner, DO;  Location: [facility];  Service: Cardiovascular;  Laterality: N/A;   UMBILICAL HERNIA REPAIR      FAMILY HISTORY: The patient family history includes Diabetes in his brother, mother, and another family member; Heart attack in his brother and brother; Heart disease in his sister; Hypertension in his brother, brother, sister, sister, and another family member; Lung cancer in his father and sister; Rheum arthritis in an other family member; Stroke in an other family member.  SOCIAL HISTORY:  The patient  reports that he quit smoking about 33 years ago. His smoking use included cigarettes. He has a 60.00 pack-year smoking history. He has never used smokeless tobacco. He reports  that he does not currently use alcohol. He reports that he does not use drugs.  REVIEW OF SYSTEMS: Review of Systems  Constitutional: Negative for weight gain.  Cardiovascular:  Negative for chest pain, claudication, dyspnea on exertion, irregular heartbeat, leg swelling, near-syncope, orthopnea, palpitations, paroxysmal nocturnal dyspnea and syncope.  Respiratory:  Negative for shortness of breath.   Hematologic/Lymphatic: Negative for bleeding problem.  Musculoskeletal:  Negative for muscle cramps and myalgias.  Neurological:  Negative for dizziness and light-headedness.    PHYSICAL EXAM:    10/19/2022   10:59 AM 10/05/2022    7:38 PM 09/15/2022    1:40 PM  Vitals with BMI  Height 6\' 6"  6\' 6"  6' 4.5"  Weight 197 lbs 192 lbs 198 lbs  BMI 22.77 22.19 23.79  Systolic 135  128  Diastolic 76  72  Pulse 72  68   Physical Exam   Constitutional: No distress.  Age appropriate, hemodynamically stable.   Neck: No JVD present.  Cardiovascular: Normal rate, regular rhythm, S1 normal, S2 normal, intact distal pulses and normal pulses. Exam reveals no gallop, no S3 and no S4.  Murmur heard. Crescendo-decrescendo systolic murmur is present with a grade of 3/6. Pulmonary/Chest: Effort normal and breath sounds normal. No stridor. He has no wheezes. He has no rales.  sternotomy site well-healed.  Abdominal: Soft. Bowel sounds are normal. He exhibits no distension. There is no abdominal tenderness.  Musculoskeletal:        General: No edema.     Cervical back: Neck supple.  Neurological: He is alert and oriented to person, place, and time. He has intact cranial nerves (2-12).  Skin: Skin is warm and moist.   CARDIAC DATABASE: 02/26/2021: CABG x4 (LIMA to LAD, SVG to DIAGONAL, SVG to OM, SVG to PLB) + MAZE + 45 mm atrial clip placement.  06/10/2021: TEE guided cardioversion for restoration of normal sinus rhythm with synchronized biphasic 200 J x 2  EKG: 01/26/2021: Atrial fibrillation with rapid ventricular rate 126 bpm, left axis deviation, left anterior fascicular block, left bundle branch block, consider old inferior infarct.  October 19, 2022: Sinus rhythm, 71 bpm, left bundle branch block, left axis deviation  Echocardiogram: 02/05/2021:  Severely depressed LV systolic function with visual EF 25-30%. Hypokinetic global wall motion with regionality involving septal wall. Left ventricle cavity is mildly dilated. Moderate left ventricular hypertrophy. Unable to evaluate diastolic function due to atrial fibrillation. Elevated LAP.  Moderate (Grade II) mitral regurgitation.  Moderate tricuspid regurgitation. Mild pulmonary hypertension, RVSP .  IVC is dilated with a respiratory response of <50%.  No prior study for comparison.  02/25/2021:  1. Left ventricular ejection fraction, by estimation, is 30 to 35%. The left  ventricle has moderately decreased function. The left ventricle demonstrates global hypokinesis. There is moderate left ventricular hypertrophy. Left ventricular diastolic function could not be evaluated.   2. Right ventricular systolic function is normal. The right ventricular size is mildly enlarged. There is normal pulmonary artery systolic pressure.   3. Left atrial size was severely dilated.   4. Right atrial size was dilated.   5. The mitral valve is normal in structure. Mild mitral valve regurgitation. No evidence of mitral stenosis.   6. The aortic valve is tricuspid. Aortic valve regurgitation is not visualized.   7. There is mild dilatation of the ascending aorta, measuring 40 mm.   12/13/2021: LVEF 40-45%, G1DD, moderate AS, proximal As Ao 42mm, estimated RAP .   06/29/2022: Mildly depressed LV systolic function  with visual EF 45-50%. Left ventricle cavity is normal in size. Mild left ventricular hypertrophy. Normal global wall motion. Abnormal septal wall motion due to post-operative septum. Doppler evidence of grade II (pseudonormal) diastolic dysfunction, normal LAP.  Left atrial cavity is mildly dilated at 37.1 ml/m^2. Native trileaflet aortic valve with no regurgitation. Aortic valve sclerosis with mild to moderate aortic valve stenosis. Peak velocity 2.17m/s, Peak Pressure Gradient 32 mmHg, Mean Gradient 14.4 mmHg, AVA 1.4cm, Dimensionless Index 0.4 Mild (Grade I) mitral regurgitation. Moderate tricuspid regurgitation. Mild pulmonary hypertension. RVSP measures 36 mmHg. The aortic root is normal. Proximal ascending aorta dilated, 42 mm. Compared to 12/13/2021 LVEF improved from 40-45% to 45-50%, G1DD is now G2DD, moderate AS is now mild/moderate, proximal As Ao remains stable, mild PHTN is new.   Stress Testing: MPI 12/14/2021 1. No reversible perfusion defects identified. Small in size, mild in severity fixed defect is noted involving the basal inferior and mid inferior  segments compatible with infarction involving the RCA.  Medium in size, mild in severity fixed defect involves the apical septal, apical inferior and apicolateral and apical segments. This is compatible with infarct involving the LAD.   2. Moderate apical and inferior hypokinesis with paradoxical motion of the septum. No left ventricular dilatation.   3. Left ventricular ejection fraction 40%, decreased from 55% on the previous exam.   4. Non invasive risk stratification*: Intermediate  Heart Catheterization: Left and right heart catheterization 02/23/2021 LM: Normal LAD: Prox 80% stenosius at bifurcation with small to medium caliber D1 with 75% long prox disease. Mid 70% disease Ramus: Minimal luminal irregularities Lcx: OM1 with prox 80% stenosis RCA: 60-70% disease in PL branches   RA: 2 mmHg RV: 22/0 mmHg PA: 21/5 mmHg, mPAP 13 mmHg PCW: 8 mmHg   CO: 5.6 L/min CI: 2.5 L/min/m2     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%, diabetic patient CVTS consult for CABG  LABORATORY DATA:    Latest Ref Rng & Units 10/08/2022   10:38 AM 12/13/2021    7:50 AM 12/12/2021    4:28 PM  CBC  WBC 4.0 - 10.5 K/uL  5.9  6.4   Hemoglobin 13.0 - 17.7 g/dL 16.1  09.6  04.5   Hematocrit 37.5 - 51.0 % 44.4  42.8  44.0   Platelets 150 - 400 K/uL  202  210        Latest Ref Rng & Units 10/08/2022   10:38 AM 07/15/2022   11:12 AM 12/13/2021    7:50 AM  CMP  Glucose 70 - 99 mg/dL 409  811  914   BUN 8 - 27 mg/dL 16  20  15    Creatinine 0.76 - 1.27 mg/dL 7.82  9.56  2.13   Sodium 134 - 144 mmol/L 140  140  137   Potassium 3.5 - 5.2 mmol/L 4.6  4.9  3.8   Chloride 96 - 106 mmol/L 103  103  107   CO2 20 - 29 mmol/L 22  21  24    Calcium 8.6 - 10.2 mg/dL 08.6  57.8  9.3     Lipid Panel     Component Value Date/Time   CHOL 120 12/13/2021 1035   TRIG 59 12/13/2021 1035   HDL 50 12/13/2021 1035   CHOLHDL 2.4 12/13/2021 1035   VLDL 12 12/13/2021 1035   LDLCALC 58 12/13/2021 1035    LDLDIRECT 54 12/13/2021 1035    No components found for: "NTPROBNP" Recent Labs    07/15/22 1108 10/08/22 1038  PROBNP 287 302   Recent Labs    12/13/21 0750  TSH 2.403    BMP Recent Labs    12/12/21 1628 12/13/21 0750 07/15/22 1112 10/08/22 1038  NA 136 137 140 140  K 3.9 3.8 4.9 4.6  CL 106 107 103 103  CO2 22 24 21 22   GLUCOSE 221* 222* 249* 142*  BUN 19 15 20 16   CREATININE 0.77 0.69 0.93 0.80  CALCIUM 9.3 9.3 10.0 10.0  GFRNONAA >60 >60  --   --     HEMOGLOBIN A1C Lab Results  Component Value Date   HGBA1C 8.7 (H) 12/13/2021   MPG 202.99 12/13/2021    IMPRESSION:    ICD-10-CM   1. Paroxysmal atrial fibrillation (HCC)  I48.0 EKG 12-Lead    2. S/P Maze operation for atrial fibrillation  Z98.890    Z86.79     3. Long term current use of antiarrhythmic drug  Z79.899     4. Long term (current) use of anticoagulants  Z79.01     5. Ischemic cardiomyopathy  I25.5     6. Atherosclerosis of native coronary artery of native heart without angina pectoris  I25.10     7. Chronic HFrEF (heart failure with reduced ejection fraction) (HCC)  I50.22     8. S/P CABG x 4  Z95.1     9. Nonrheumatic aortic valve stenosis  I35.0 ECHOCARDIOGRAM COMPLETE    10. Type 2 diabetes mellitus with microalbuminuria, without long-term current use of insulin (HCC)  E11.29    R80.9     11. Mixed hyperlipidemia  E78.2     12. Sleep apnea, unspecified type  G47.30         RECOMMENDATIONS: Garey Ray Karan is a 77 y.o. male whose past medical history and cardiac risk factors include: Ischemic cardiomyopathy, acute/chronic heart failure with reduced EF, hypertension, diabetes mellitus with microalbuminuria, erectile dysfunction, heartburn, former smoker, hyperlipidemia, sleep apnea not on CPAP, persistent atrial fibrillation status post Maze procedure, advanced age.  Paroxysmal atrial fibrillation (HCC) S/P Maze operation for atrial fibrillation Long term current use of  antiarrhythmic drug Long term (current) use of anticoagulants EKG: Sinus rhythm. Rate control: Metoprolol. Rhythm control: Multaq. Thromboembolic prophylaxis: Eliquis. CHA2DS2-VASc SCORE is 5 which correlates to 6.7% risk of stroke per year.  Successfully underwent TEE guided cardioversion 05/2021. Does not endorse bleeding. Labs from May 2024 reviewed hemoglobin and renal function remained stable. Has been evaluated for sleep apnea and is awaiting device therapy. Currently on methotrexate for reasons he does not know.  I have asked him to discuss with his prescribing physician and if possible consider alternative or discontinuation as he is on antiarrhythmic medications.  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 Euvolemic. Stage C, NYHA class II. Last MPI in July 2023 - no reversible defect.  Most recent echocardiogram notes slight improvement in LVEF at 45-50%, grade 2 diastolic dysfunction, other details noted above. Medications reconciled. Most recent labs from May 2024 independently reviewed and noted above.  Nonrheumatic aortic valve stenosis Asymptomatic. Based on the echo severity remains stable at moderate. Denies anginal discomfort, heart failure exacerbation, or syncope. Will repeat echocardiogram in December 2024 to reevaluate LVEF and severity/progression of aortic stenosis Monitor for now  Type 2 diabetes mellitus with microalbuminuria, without long-term current use of insulin (HCC) Reemphasized importance of glycemic control. Currently on Entresto, Farxiga, statin  therapy  Mixed hyperlipidemia Currently on rosuvastatin.   He denies myalgia or other side effects. Most recent lipids dated July 2023 reviewed as noted above.   FINAL MEDICATION LIST END OF ENCOUNTER: No orders of the defined  types were placed in this encounter.  There are no discontinued medications.   Current Outpatient Medications:    acetaminophen (TYLENOL) 500 MG tablet, Take 1,000 mg by mouth every 6 (six) hours as needed for moderate pain or mild pain., Disp: , Rfl:    aspirin EC 81 MG tablet, Take 81 mg by mouth daily., Disp: , Rfl:    ELIQUIS 5 MG TABS tablet, TAKE 1 TABLET (5 MG TOTAL) BY MOUTH 2 (TWO) TIMES DAILY., Disp: 180 tablet, Rfl: 10   ENTRESTO 97-103 MG, TAKE 1 TABLET TWICE DAILY, Disp: 180 tablet, Rfl: 10   famotidine (PEPCID) 40 MG tablet, Take 40 mg by mouth at bedtime., Disp: , Rfl:    FARXIGA 10 MG TABS tablet, TAKE 1 TABLET EVERY DAY, Disp: 90 tablet, Rfl: 3   folic acid (FOLVITE) 1 MG tablet, Take 2 mg by mouth daily., Disp: , Rfl:    glipiZIDE (GLUCOTROL XL) 10 MG 24 hr tablet, Take 10 mg by mouth 2 (two) times daily., Disp: , Rfl:    magnesium oxide (MAG-OX) 400 (240 Mg) MG tablet, TAKE 1 TABLET EVERY DAY, Disp: 90 tablet, Rfl: 10   metFORMIN (GLUCOPHAGE) 1000 MG tablet, Take 1,000 mg by mouth daily with breakfast., Disp: , Rfl:    methotrexate (RHEUMATREX) 2.5 MG tablet, Take 2.5 mg by mouth as directed., Disp: , Rfl:    metoprolol succinate (TOPROL-XL) 25 MG 24 hr tablet, TAKE 1 TABLET EVERY MORNING, Disp: 90 tablet, Rfl: 3   MULTAQ 400 MG tablet, TAKE 1 TABLET (400 MG TOTAL) BY MOUTH 2 (TWO) TIMES DAILY WITH A MEAL., Disp: 180 tablet, Rfl: 1   nitroGLYCERIN (NITROSTAT) 0.4 MG SL tablet, Place under the tongue., Disp: , Rfl:    pantoprazole (PROTONIX) 40 MG tablet, Take 40 mg by mouth every evening., Disp: , Rfl:    potassium chloride (KLOR-CON M) 10 MEQ tablet, TAKE 1 TABLET (10 MEQ TOTAL) BY MOUTH DAILY., Disp: 90 tablet, Rfl: 3   ranolazine (RANEXA) 500 MG 12 hr tablet, Take 1 tablet (500 mg total) by mouth 2 (two) times daily., Disp: 180 tablet, Rfl: 3   rosuvastatin (CRESTOR) 5 MG tablet, Take 5 mg by mouth at bedtime., Disp: , Rfl:   Orders Placed This Encounter  Procedures    EKG 12-Lead   ECHOCARDIOGRAM COMPLETE    There are no Patient Instructions on file for this visit.   --Continue cardiac medications as reconciled in final medication list. --Return in about 3 months (around 01/19/2023) for Follow up, CAD, heart failure management.. Or sooner if needed. --Continue follow-up with your primary care physician regarding the management of your other chronic comorbid conditions.  Patient's questions and concerns were addressed to his satisfaction. He voices understanding of the instructions provided during this encounter.   This note was created using a voice recognition software as a result there may be grammatical errors inadvertently enclosed that do not reflect the nature of this encounter. Every attempt is made to correct such errors.  Tessa Lerner, [place], [facility]  Pager:  [phone] Office: [phone]

## 2022-10-20 ENCOUNTER — Telehealth: Payer: Self-pay | Admitting: Pulmonary Disease

## 2022-10-20 DIAGNOSIS — G4733 Obstructive sleep apnea (adult) (pediatric): Secondary | ICD-10-CM | POA: Diagnosis not present

## 2022-10-20 NOTE — Telephone Encounter (Signed)
Rx has been put in.   Nothing further needed.

## 2022-10-20 NOTE — Procedures (Signed)
Patient Name: Tymel, Kopacz Date: 10/05/2022 Gender: Male D.O.B: Apr 07, 1946 Age (years): 60 Referring Provider: Cyril Mourning MD, ABSM Height (inches): 78 Interpreting Physician: Cyril Mourning MD, ABSM Weight (lbs): 192 RPSGT: Rosette Reveal BMI: 22 MRN: [id number] Neck Size: 16.00 <br> <br> CLINICAL INFORMATION The patient is referred for a BiPAP titration to treat sleep apnea.    10/2021 HST showed severe OSA with AHI 40/ hr & low O2 of 78% - 187 lbs  SLEEP STUDY TECHNIQUE As per the AASM Manual for the Scoring of Sleep and Associated Events v2.3 (April 2016) with a hypopnea requiring 4% desaturations.  The channels recorded and monitored were frontal, central and occipital EEG, electrooculogram (EOG), submentalis EMG (chin), nasal and oral airflow, thoracic and abdominal wall motion, anterior tibialis EMG, snore microphone, electrocardiogram, and pulse oximetry. Bilevel positive airway pressure (BPAP) was initiated at the beginning of the study and titrated to treat sleep-disordered breathing.  MEDICATIONS Medications self-administered by patient taken the night of the study : N/A  RESPIRATORY PARAMETERS Optimal IPAP Pressure (cm): 22 AHI at Optimal Pressure (/hr) 1.1 Optimal EPAP Pressure (cm): 18   Overall Minimal O2 (%): 89.0 Minimal O2 at Optimal Pressure (%): 95.0 SLEEP ARCHITECTURE Start Time: 9:58:43 PM Stop Time: 4:33:08 AM Total Time (min): 394.4 Total Sleep Time (min): 263.5 Sleep Latency (min): 36.8 Sleep Efficiency (%): 66.8% REM Latency (min): 61.0 WASO (min): 94.2 Stage N1 (%): 9.7% Stage N2 (%): 70.8% Stage N3 (%): 0.0% Stage R (%): 19.5 Supine (%): 100.00 Arousal Index (/hr): 16.9     CARDIAC DATA The 2 lead EKG demonstrated sinus rhythm. The mean heart rate was 58.0 beats per minute. Other EKG findings include: None.   LEG MOVEMENT DATA The total Periodic Limb Movements of Sleep (PLMS) were 0. The PLMS index was 0.0. A PLMS index of <15 is  considered normal in adults.  IMPRESSIONS - An optimal PAP pressure was selected for this patient ( 22 /18 cm of water) - Mild oxygen desaturations were observed during this titration (min O2 = 89.0%). - The patient snored with moderate snoring volume. - No cardiac abnormalities were observed during this study. - Clinically significant periodic limb movements were not noted during this study. Arousals associated with PLMs were rare.   DIAGNOSIS - Obstructive Sleep Apnea (G47.33)   RECOMMENDATIONS - Trial of BiPAP therapy on 22/18 cm H2O with a Medium size Fisher&Paykel Full Face Simplus mask and heated humidification. Patient had difficulty tolerating high presures on CPAP & required bilevel. Auto BiPAP can be used - Avoid alcohol, sedatives and other CNS depressants that may worsen sleep apnea and disrupt normal sleep architecture. - Sleep hygiene should be reviewed to assess factors that may improve sleep quality. - Weight management and regular exercise should be initiated or continued. - Return to Sleep Center for re-evaluation after 4 weeks of therapy  [Electronically signed] 10/20/2022 09:55 AM  Cyril Mourning MD, ABSM Diplomate, American Board of Sleep Medicine NPI: [id number]

## 2022-10-20 NOTE — Telephone Encounter (Signed)
Based on titration study, send prescription to DME for Auto BiPAP EPAP 10cm, PS +4 cm, IPAP max 22 cm Medium fullface mask.  Office visit in 6 to 8 weeks

## 2022-10-24 ENCOUNTER — Encounter (HOSPITAL_COMMUNITY): Payer: Self-pay | Admitting: *Deleted

## 2022-10-24 ENCOUNTER — Emergency Department (HOSPITAL_COMMUNITY): Payer: Medicare PPO

## 2022-10-24 ENCOUNTER — Emergency Department (HOSPITAL_COMMUNITY)
Admission: EM | Admit: 2022-10-24 | Discharge: 2022-10-24 | Disposition: A | Discharge status: Home / Self Care | Payer: Medicare PPO | Attending: Emergency Medicine | Admitting: Emergency Medicine

## 2022-10-24 ENCOUNTER — Other Ambulatory Visit: Payer: Self-pay

## 2022-10-24 DIAGNOSIS — R531 Weakness: Secondary | ICD-10-CM | POA: Insufficient documentation

## 2022-10-24 DIAGNOSIS — L03115 Cellulitis of right lower limb: Secondary | ICD-10-CM | POA: Insufficient documentation

## 2022-10-24 DIAGNOSIS — Z7901 Long term (current) use of anticoagulants: Secondary | ICD-10-CM | POA: Diagnosis not present

## 2022-10-24 DIAGNOSIS — I2510 Atherosclerotic heart disease of native coronary artery without angina pectoris: Secondary | ICD-10-CM | POA: Insufficient documentation

## 2022-10-24 DIAGNOSIS — Z7984 Long term (current) use of oral hypoglycemic drugs: Secondary | ICD-10-CM | POA: Diagnosis not present

## 2022-10-24 DIAGNOSIS — E119 Type 2 diabetes mellitus without complications: Secondary | ICD-10-CM | POA: Diagnosis not present

## 2022-10-24 DIAGNOSIS — Z7982 Long term (current) use of aspirin: Secondary | ICD-10-CM | POA: Insufficient documentation

## 2022-10-24 DIAGNOSIS — I10 Essential (primary) hypertension: Secondary | ICD-10-CM | POA: Diagnosis not present

## 2022-10-24 DIAGNOSIS — M7989 Other specified soft tissue disorders: Secondary | ICD-10-CM | POA: Diagnosis not present

## 2022-10-24 DIAGNOSIS — Z79899 Other long term (current) drug therapy: Secondary | ICD-10-CM | POA: Insufficient documentation

## 2022-10-24 LAB — BASIC METABOLIC PANEL
Anion gap: 7 (ref 5–15)
BUN: 19 mg/dL (ref 8–23)
CO2: 24 mmol/L (ref 22–32)
Calcium: 9.4 mg/dL (ref 8.9–10.3)
Chloride: 104 mmol/L (ref 98–111)
Creatinine, Ser: 0.8 mg/dL (ref 0.61–1.24)
GFR, Estimated: >60 mL/min (ref >60)
Glucose, Bld: 201 mg/dL — ABNORMAL HIGH (ref 70–99)
Potassium: 4.1 mmol/L (ref 3.5–5.1)
Sodium: 135 mmol/L (ref 135–145)

## 2022-10-24 LAB — CBC WITH DIFFERENTIAL/PLATELET
Abs Immature Granulocytes: 0.02 10*3/uL (ref 0.00–0.07)
Basophils Absolute: 0 10*3/uL (ref 0.0–0.1)
Basophils Relative: 0 %
Eosinophils Absolute: 0.1 10*3/uL (ref 0.0–0.5)
Eosinophils Relative: 2 %
HCT: 42.7 % (ref 39.0–52.0)
Hemoglobin: 14 g/dL (ref 13.0–17.0)
Immature Granulocytes: 0 %
Lymphocytes Relative: 20 %
Lymphs Abs: 1.1 10*3/uL (ref 0.7–4.0)
MCH: 31.1 pg (ref 26.0–34.0)
MCHC: 32.8 g/dL (ref 30.0–36.0)
MCV: 94.9 fL (ref 80.0–100.0)
Monocytes Absolute: 0.7 10*3/uL (ref 0.1–1.0)
Monocytes Relative: 12 %
Neutro Abs: 3.5 10*3/uL (ref 1.7–7.7)
Neutrophils Relative %: 66 %
Platelets: 235 10*3/uL (ref 150–400)
RBC: 4.5 MIL/uL (ref 4.22–5.81)
RDW: 15.9 % — ABNORMAL HIGH (ref 11.5–15.5)
WBC: 5.4 10*3/uL (ref 4.0–10.5)
nRBC: 0 % (ref 0.0–0.2)

## 2022-10-24 MED ORDER — AMOXICILLIN-POT CLAVULANATE 875-125 MG PO TABS: 1.0000 | ORAL_TABLET | Freq: Two times a day (BID) | ORAL | 0 refills | Status: AC

## 2022-10-24 MED ORDER — SODIUM CHLORIDE 0.9 % IV SOLN
1.0000 g | Freq: Once | INTRAVENOUS | Status: AC
  Administered 2022-10-24: 1 g via INTRAVENOUS
  Filled 2022-10-24: qty 10

## 2022-10-24 NOTE — Discharge Instructions (Addendum)
Elevate your right foot when possible.  Clean the wound with antibacterial soap and water.  Keep it bandaged.  Take the antibiotic as directed.  Please follow-up with your primary care provider in 2 to 3 days for recheck.  If you notice increased swelling, increasing redness fever or chills please return to the ER for recheck

## 2022-10-24 NOTE — ED Triage Notes (Signed)
Pt with right ankle swelling and redness for past 2 days, denies any injury. Also of fatigue for past week.

## 2022-10-25 DIAGNOSIS — E1129 Type 2 diabetes mellitus with other diabetic kidney complication: Secondary | ICD-10-CM | POA: Diagnosis not present

## 2022-10-25 DIAGNOSIS — R809 Proteinuria, unspecified: Secondary | ICD-10-CM | POA: Diagnosis not present

## 2022-10-25 DIAGNOSIS — I10 Essential (primary) hypertension: Secondary | ICD-10-CM | POA: Diagnosis not present

## 2022-10-25 DIAGNOSIS — Z299 Encounter for prophylactic measures, unspecified: Secondary | ICD-10-CM | POA: Diagnosis not present

## 2022-10-25 DIAGNOSIS — I429 Cardiomyopathy, unspecified: Secondary | ICD-10-CM | POA: Diagnosis not present

## 2022-10-26 ENCOUNTER — Ambulatory Visit (HOSPITAL_COMMUNITY)
Admission: RE | Admit: 2022-10-26 | Discharge: 2022-10-26 | Disposition: A | Discharge status: Home / Self Care | Payer: Medicare PPO | Source: Ambulatory Visit | Attending: Cardiology | Admitting: Cardiology

## 2022-10-26 DIAGNOSIS — I2510 Atherosclerotic heart disease of native coronary artery without angina pectoris: Secondary | ICD-10-CM | POA: Insufficient documentation

## 2022-10-26 DIAGNOSIS — E119 Type 2 diabetes mellitus without complications: Secondary | ICD-10-CM | POA: Diagnosis not present

## 2022-10-26 DIAGNOSIS — E785 Hyperlipidemia, unspecified: Secondary | ICD-10-CM | POA: Diagnosis not present

## 2022-10-26 DIAGNOSIS — I110 Hypertensive heart disease with heart failure: Secondary | ICD-10-CM | POA: Insufficient documentation

## 2022-10-26 DIAGNOSIS — I429 Cardiomyopathy, unspecified: Secondary | ICD-10-CM | POA: Insufficient documentation

## 2022-10-26 DIAGNOSIS — I350 Nonrheumatic aortic (valve) stenosis: Secondary | ICD-10-CM | POA: Diagnosis not present

## 2022-10-26 DIAGNOSIS — F17200 Nicotine dependence, unspecified, uncomplicated: Secondary | ICD-10-CM | POA: Diagnosis not present

## 2022-10-26 DIAGNOSIS — Z951 Presence of aortocoronary bypass graft: Secondary | ICD-10-CM | POA: Diagnosis not present

## 2022-10-26 DIAGNOSIS — G4730 Sleep apnea, unspecified: Secondary | ICD-10-CM | POA: Insufficient documentation

## 2022-10-26 DIAGNOSIS — I083 Combined rheumatic disorders of mitral, aortic and tricuspid valves: Secondary | ICD-10-CM | POA: Diagnosis not present

## 2022-10-26 DIAGNOSIS — I509 Heart failure, unspecified: Secondary | ICD-10-CM | POA: Insufficient documentation

## 2022-10-26 DIAGNOSIS — I447 Left bundle-branch block, unspecified: Secondary | ICD-10-CM | POA: Diagnosis not present

## 2022-10-26 LAB — ECHOCARDIOGRAM COMPLETE
AR max vel: 1.22 cm2
AV Area VTI: 1.34 cm2
AV Area mean vel: 1.22 cm2
AV Mean grad: 15 mmHg
AV Peak grad: 26.8 mmHg
Ao pk vel: 2.59 m/s
Area-P 1/2: 2.99 cm2
Calc EF: 47.1 %
MV M vel: 2.62 m/s
MV Peak grad: 27.4 mmHg
S' Lateral: 3.2 cm
Single Plane A2C EF: 49.1 %
Single Plane A4C EF: 48.6 %

## 2022-10-26 NOTE — ED Provider Notes (Signed)
[place] EMERGENCY DEPARTMENT AT [facility] Provider Note   CSN: [id number] Arrival date & time: 10/24/22  1527     History  Chief Complaint  Patient presents with   Joint Swelling    Asheton Ray Homesley is a 77 y.o. male.  HPI      Kyl Ramaj Frangos is a 77 y.o. male past medical history of atrial fibrillation, hypertension, type 2 diabetes, coronary artery disease, who presents to the Emergency Department complaining of redness pain and swelling of the right ankle.  Symptoms present for 2 days.  He describes aching pain of his foot and ankle.  He also notes some generalized weakness and fatigue for several days.  Is having some redness of his foot and ankle as well.  He denies fever chills nausea or vomiting.  No dizziness or headache.  Pain does not radiate into his groin.  No known rash or tick bite.   Home Medications Prior to Admission medications   Medication Sig Start Date End Date Taking? Authorizing Provider  amoxicillin-clavulanate (AUGMENTIN) 875-125 MG tablet Take 1 tablet by mouth every 12 (twelve) hours. 10/24/22  Yes Kamariah Fruchter, PA-C  acetaminophen (TYLENOL) 500 MG tablet Take 1,000 mg by mouth every 6 (six) hours as needed for moderate pain or mild pain.    [provider]  aspirin EC 81 MG tablet Take 81 mg by mouth daily.    [provider]  ELIQUIS 5 MG TABS tablet TAKE 1 TABLET (5 MG TOTAL) BY MOUTH 2 (TWO) TIMES DAILY. 03/29/22   Tolia, Sunit, DO  ENTRESTO 97-103 MG TAKE 1 TABLET TWICE DAILY 03/29/22   Tolia, Sunit, DO  famotidine (PEPCID) 40 MG tablet Take 40 mg by mouth at bedtime. 04/13/21   [provider]  FARXIGA 10 MG TABS tablet TAKE 1 TABLET EVERY DAY 09/27/22   Tolia, Sunit, DO  folic acid (FOLVITE) 1 MG tablet Take 2 mg by mouth daily.    [provider]  glipiZIDE (GLUCOTROL XL) 10 MG 24 hr tablet Take 10 mg by mouth 2 (two) times daily.    [provider]  magnesium oxide (MAG-OX) 400 (240  Mg) MG tablet TAKE 1 TABLET EVERY DAY 02/22/22   Tolia, Sunit, DO  metFORMIN (GLUCOPHAGE) 1000 MG tablet Take 1,000 mg by mouth daily with breakfast.    [provider]  methotrexate (RHEUMATREX) 2.5 MG tablet Take 2.5 mg by mouth as directed. 12/04/21   [provider]  metoprolol succinate (TOPROL-XL) 25 MG 24 hr tablet TAKE 1 TABLET EVERY MORNING 05/03/22   Tolia, Sunit, DO  MULTAQ 400 MG tablet TAKE 1 TABLET (400 MG TOTAL) BY MOUTH 2 (TWO) TIMES DAILY WITH A MEAL. 09/24/22   Tolia, Sunit, DO  nitroGLYCERIN (NITROSTAT) 0.4 MG SL tablet Place under the tongue. 06/24/21   [provider]  pantoprazole (PROTONIX) 40 MG tablet Take 40 mg by mouth every evening.    [provider]  potassium chloride (KLOR-CON M) 10 MEQ tablet TAKE 1 TABLET (10 MEQ TOTAL) BY MOUTH DAILY. 07/07/22   Tolia, Sunit, DO  ranolazine (RANEXA) 500 MG 12 hr tablet Take 1 tablet (500 mg total) by mouth 2 (two) times daily. 02/09/22   Tolia, Sunit, DO  rosuvastatin (CRESTOR) 5 MG tablet Take 5 mg by mouth at bedtime.    [provider]      Allergies    Bee venom    Review of Systems   Review of Systems  Constitutional:  Positive for fatigue. Negative for chills and fever.  Respiratory:  Negative for shortness of breath.   Cardiovascular:  Negative for chest pain.  Gastrointestinal:  Negative for abdominal pain, nausea and vomiting.  Musculoskeletal:  Positive for arthralgias (Right ankle pain and swelling) and joint swelling.  Skin:  Positive for color change and wound.  Neurological:  Positive for weakness. Negative for dizziness, syncope, numbness and headaches.    Physical Exam Updated Vital Signs BP 131/72 (BP Location: Right Arm)   Pulse 60   Temp 98.1 F (36.7 C) (Oral)   Resp 18   SpO2 96%  Physical Exam Vitals and nursing note reviewed.  Constitutional:      General: He is not in acute distress.    Appearance: Normal appearance. He is not ill-appearing or  toxic-appearing.  Cardiovascular:     Rate and Rhythm: Normal rate and regular rhythm.     Pulses: Normal pulses.  Pulmonary:     Effort: Pulmonary effort is normal.  Musculoskeletal:        General: Swelling and tenderness present. No signs of injury. Normal range of motion.     Comments: Mild tenderness to palpation of the dorsal right foot, right ankle.  Mild erythema and warmth noted.  Erythema extends to the distal third of the right lower leg.  On exam of the foot, there is dark-colored open wound to the plantar surface of the right second toe.  No purulent drainage noted.  No lymphangitis.  Skin:    General: Skin is warm.     Capillary Refill: Capillary refill takes less than 2 seconds.     Findings: Erythema present. No bruising or rash.  Neurological:     General: No focal deficit present.     Mental Status: He is alert.     Sensory: No sensory deficit.     Motor: No weakness.     ED Results / Procedures / Treatments   Labs (all labs ordered are listed, but only abnormal results are displayed) Labs Reviewed  CBC WITH DIFFERENTIAL/PLATELET - Abnormal; Notable for the following components:      Result Value   RDW 15.9 (*)    All other components within normal limits  BASIC METABOLIC PANEL - Abnormal; Notable for the following components:   Glucose, Bld 201 (*)    All other components within normal limits    EKG None  Radiology  DG Foot Complete Right  Result Date: 10/24/2022 CLINICAL DATA:  redness swelling foot, wound second toe. EXAM: RIGHT FOOT COMPLETE - 3+ VIEW COMPARISON:  None Available. FINDINGS: Three views of the right foot. No evidence of fracture or dislocation. Mild degenerative changes of the ankle joint. Plantar calcaneal spur and Achilles enthesopathy. Soft tissues are unremarkable. IMPRESSION: 1. No acute findings or evidence of bony erosion. 2. Mild degenerative changes of the ankle joint. Electronically Signed   By: Orvan Falconer M.D.   On:  10/24/2022 16:56    Procedures Procedures    Medications Ordered in ED Medications  cefTRIAXone (ROCEPHIN) 1 g in sodium chloride 0.9 % 100 mL IVPB (0 g Intravenous Stopped 10/24/22 1848)    ED Course/ Medical Decision Making/ A&P                             Medical Decision Making Patient here with pain redness and swelling of his right foot and ankle.  Endorses generalized weakness and fatigue for several days.  No known injury.  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  I suspect this is source of patient's symptoms and likely cellulitis.  DVT also considered but felt less likely.  Tick bite also considered.  I do not appreciate any lymphangitis or target lesions.Marland Kitchen  He is well-appearing, nontoxic.  Vital signs are reassuring.  No clinical signs concerning for sepsis.  Amount and/or Complexity of Data Reviewed Labs: ordered.    Details: Labs interpreted by me without evidence of leukocytosis, hemoglobin reassuring.  Chemistries show mild hyperglycemia otherwise unremarkable. Radiology: ordered.    Details: X-ray of the foot without acute bony findings or osteomyelitis Discussion of management or test interpretation with external provider(s): Patient with likely cellulitis secondary to wound of second toe.  Given IV Rocephin here and will start on oral antibiotics.  Agreeable to close out pt f/u with PCP, return precautions also given  Risk Prescription drug management.           Final Clinical Impression(s) / ED Diagnoses Final diagnoses:  Cellulitis of right lower extremity    Rx / DC Orders ED Discharge Orders          Ordered    amoxicillin-clavulanate (AUGMENTIN) 875-125 MG tablet  Every 12 hours        10/24/22 1830              Pauline Aus, PA-C 10/26/22 1527    Bethann Berkshire, MD 10/30/22 1205

## 2022-10-28 NOTE — Progress Notes (Signed)
Do I need to move appointment up?

## 2022-10-28 NOTE — Progress Notes (Signed)
Called and spoke with patient regarding his echocardiogram results and confirmed upcoming appointment.

## 2022-11-01 ENCOUNTER — Telehealth: Payer: Self-pay | Admitting: *Deleted

## 2022-11-01 NOTE — Progress Notes (Signed)
  Care Coordination  Outreach Note  11/01/2022 Name: Yesenia Mantegna [facility] MRN: [id number] DOB: 1945-05-31   Care Coordination Outreach Attempts: An unsuccessful telephone outreach was attempted today to offer the patient information about available care coordination services.  Follow Up Plan:  Additional outreach attempts will be made to offer the patient care coordination information and services.   Encounter Outcome:  No Answer  Christie Nottingham  Care Coordination Care Guide  Direct Dial: [phone]

## 2022-11-03 DIAGNOSIS — G4733 Obstructive sleep apnea (adult) (pediatric): Secondary | ICD-10-CM | POA: Diagnosis not present

## 2022-11-08 NOTE — Progress Notes (Signed)
  Care Coordination   Note   11/08/2022 Name: Derek Bond [facility] MRN: [id number] DOB: Sep 17, 1945  Derek Bond is a 77 y.o. year old male who sees Vyas, Dhruv B, MD for primary care. I reached out to [employer] by phone today to offer care coordination services.  [name] was given information about Care Coordination services today including:   The Care Coordination services include support from the care team which includes your Nurse Coordinator, Clinical Social Worker, or Pharmacist.  The Care Coordination team is here to help remove barriers to the health concerns and goals most important to you. Care Coordination services are voluntary, and the patient may decline or stop services at any time by request to their care team member.   Care Coordination Consent Status: Patient agreed to services and verbal consent obtained.   Follow up plan:  Telephone appointment with care coordination team member scheduled for:  11/10/22  Encounter Outcome:  Pt. Scheduled [facility] Coordination Care Guide  Direct Dial: [phone]

## 2022-11-10 ENCOUNTER — Ambulatory Visit: Payer: Self-pay | Admitting: Licensed Clinical Social Worker

## 2022-11-10 NOTE — Patient Outreach (Signed)
  Care Coordination   Initial Visit Note   11/10/2022 Name: Derek Bond [facility] MRN: [id number] DOB: 01/09/1946  Derek Bond is a 77 y.o. year old male who sees Vyas, Dhruv B, MD for primary care. I spoke with  Derek Bond by phone today.  What matters to the patients health and wellness today? Patient has stress related to managing medical needs    Goals Addressed             This Visit's Progress    Patient has stress related to managing medical needs       Interventions:  Spoke with client about client needs Discussed program support with client. Discussed RN, Pharmacist and LCSW  support Reviewed pain issues. Reviewed sleeping issues. Client said he is sleeping adequately Client is driving short trips as needed. Discussed family support. He said that his grandson and his wife reside with client and are supportive Discussed meal provision for client Discussed medication procurement.  Client said he takes 22 medications per day.  He is trying to manage costs of these medications Discussed walking of client. He uses a cane as needed to help him walk Discussed support of PCP , Dr. Doreen Beam in [place], [place]  Client recently had appointment with PCP. Client spoke of history of heart issues.  Provided counseling support Discussed arthritis challenges. Discussed pain issus associated with arthritis. He said he still functions adequately Discussed relaxation activities;  he likes building things as a hobby; he goes to church.  He likes to watch TV. He likes going out to eat with friends Discussed cardiologist support. Discussed client insurance. He has [employer]. Client said he sometimes gets dizzy.  Client said he has talked with PCP about his medications and use of his medications Encouraged client to call LCSW as needed at [phone].for social work support        SDOH assessments and interventions completed:  Yes  SDOH Interventions Today    Flowsheet Row  Most Recent Value  SDOH Interventions   Depression Interventions/Treatment  Counseling  Physical Activity Interventions Other (Comments)  [fatigues sometimes when walking]  Stress Interventions Provide Counseling  [stress in managing medical needs]        Care Coordination Interventions:  Yes, provided   Interventions Today    Flowsheet Row Most Recent Value  Chronic Disease   Chronic disease during today's visit Other  [spoke with client about client needs]  General Interventions   General Interventions Discussed/Reviewed General Interventions Discussed, [employer]  [discussed program support]  Exercise Interventions   Exercise Discussed/Reviewed Physical Activity  [uses cane to help him walk]  Physical Activity Discussed/Reviewed Physical Activity Discussed  Education Interventions   Education Provided Provided Education  Provided Verbal Education On Community Resources  Mental Health Interventions   Mental Health Discussed/Reviewed Anxiety, Coping Strategies  [client feels that his mood is stable]  Nutrition Interventions   Nutrition Discussed/Reviewed Nutrition Discussed  Pharmacy Interventions   Pharmacy Dicussed/Reviewed Pharmacy Topics Discussed        Follow up plan: Follow up call scheduled for 12/15/22 at 9:30 AM    Encounter Outcome:  Pt. Visit Completed   Kelton Pillar.Latarshia Jersey MSW, LCSW Licensed Visual merchandiser [facility] Care Management [phone]

## 2022-11-10 NOTE — Patient Instructions (Signed)
Visit Information  Thank you for taking time to visit with me today. Please don't hesitate to contact me if I can be of assistance to you.   Following are the goals we discussed today:   Goals Addressed             This Visit's Progress    Patient has stress related to managing medical needs       Interventions:  Spoke with client about client needs Discussed program support with client. Discussed RN, Pharmacist and LCSW  support Reviewed pain issues. Reviewed sleeping issues. Client said he is sleeping adequately Client is driving short trips as needed. Discussed family support. He said that his grandson and his wife reside with client and are supportive Discussed meal provision for client Discussed medication procurement.  Client said he takes 22 medications per day.  He is trying to manage costs of these medications Discussed walking of client. He uses a cane as needed to help him walk Discussed support of PCP , Dr. Doreen Beam in [place], [place]  Client recently had appointment with PCP. Client spoke of history of heart issues.  Provided counseling support Discussed arthritis challenges. Discussed pain issus associated with arthritis. He said he still functions adequately Discussed relaxation activities;  he likes building things as a hobby; he goes to church.  He likes to watch TV. He likes going out to eat with friends Discussed cardiologist support. Discussed client insurance. He has [employer]. Client said he sometimes gets dizzy.  Client said he has talked with PCP about his medications and use of his medications Encouraged client to call LCSW as needed at [phone].for social work support        Our next appointment is by telephone on 12/15/22 at 9:30 AM   Please call the care guide team at [phone] if you need to cancel or reschedule your appointment.   If you are experiencing a Mental Health or Behavioral Health Crisis or need someone to talk to, please call  the [facility]: [phone]   The patient verbalized understanding of instructions, educational materials, and care plan provided today and DECLINED offer to receive copy of patient instructions, educational materials, and care plan.   The patient has been provided with contact information for the care management team and has been advised to call with any health related questions or concerns.   Kelton Pillar.Sherryl Valido MSW, LCSW Licensed Visual merchandiser [facility] Care Management [phone]

## 2022-11-14 DIAGNOSIS — E1165 Type 2 diabetes mellitus with hyperglycemia: Secondary | ICD-10-CM | POA: Diagnosis not present

## 2022-11-14 DIAGNOSIS — I10 Essential (primary) hypertension: Secondary | ICD-10-CM | POA: Diagnosis not present

## 2022-11-23 DIAGNOSIS — L03031 Cellulitis of right toe: Secondary | ICD-10-CM | POA: Diagnosis not present

## 2022-11-23 DIAGNOSIS — Z299 Encounter for prophylactic measures, unspecified: Secondary | ICD-10-CM | POA: Diagnosis not present

## 2022-11-23 DIAGNOSIS — I10 Essential (primary) hypertension: Secondary | ICD-10-CM | POA: Diagnosis not present

## 2022-11-25 IMAGING — DX DG CHEST 1V PORT
1 series · 1 of 1 positions shown · non-contrast
Comparison: 02/21/2021 chest radiograph.

CLINICAL DATA: Post open cardiac surgery

EXAM:
PORTABLE CHEST 1 VIEW

[chest ap]
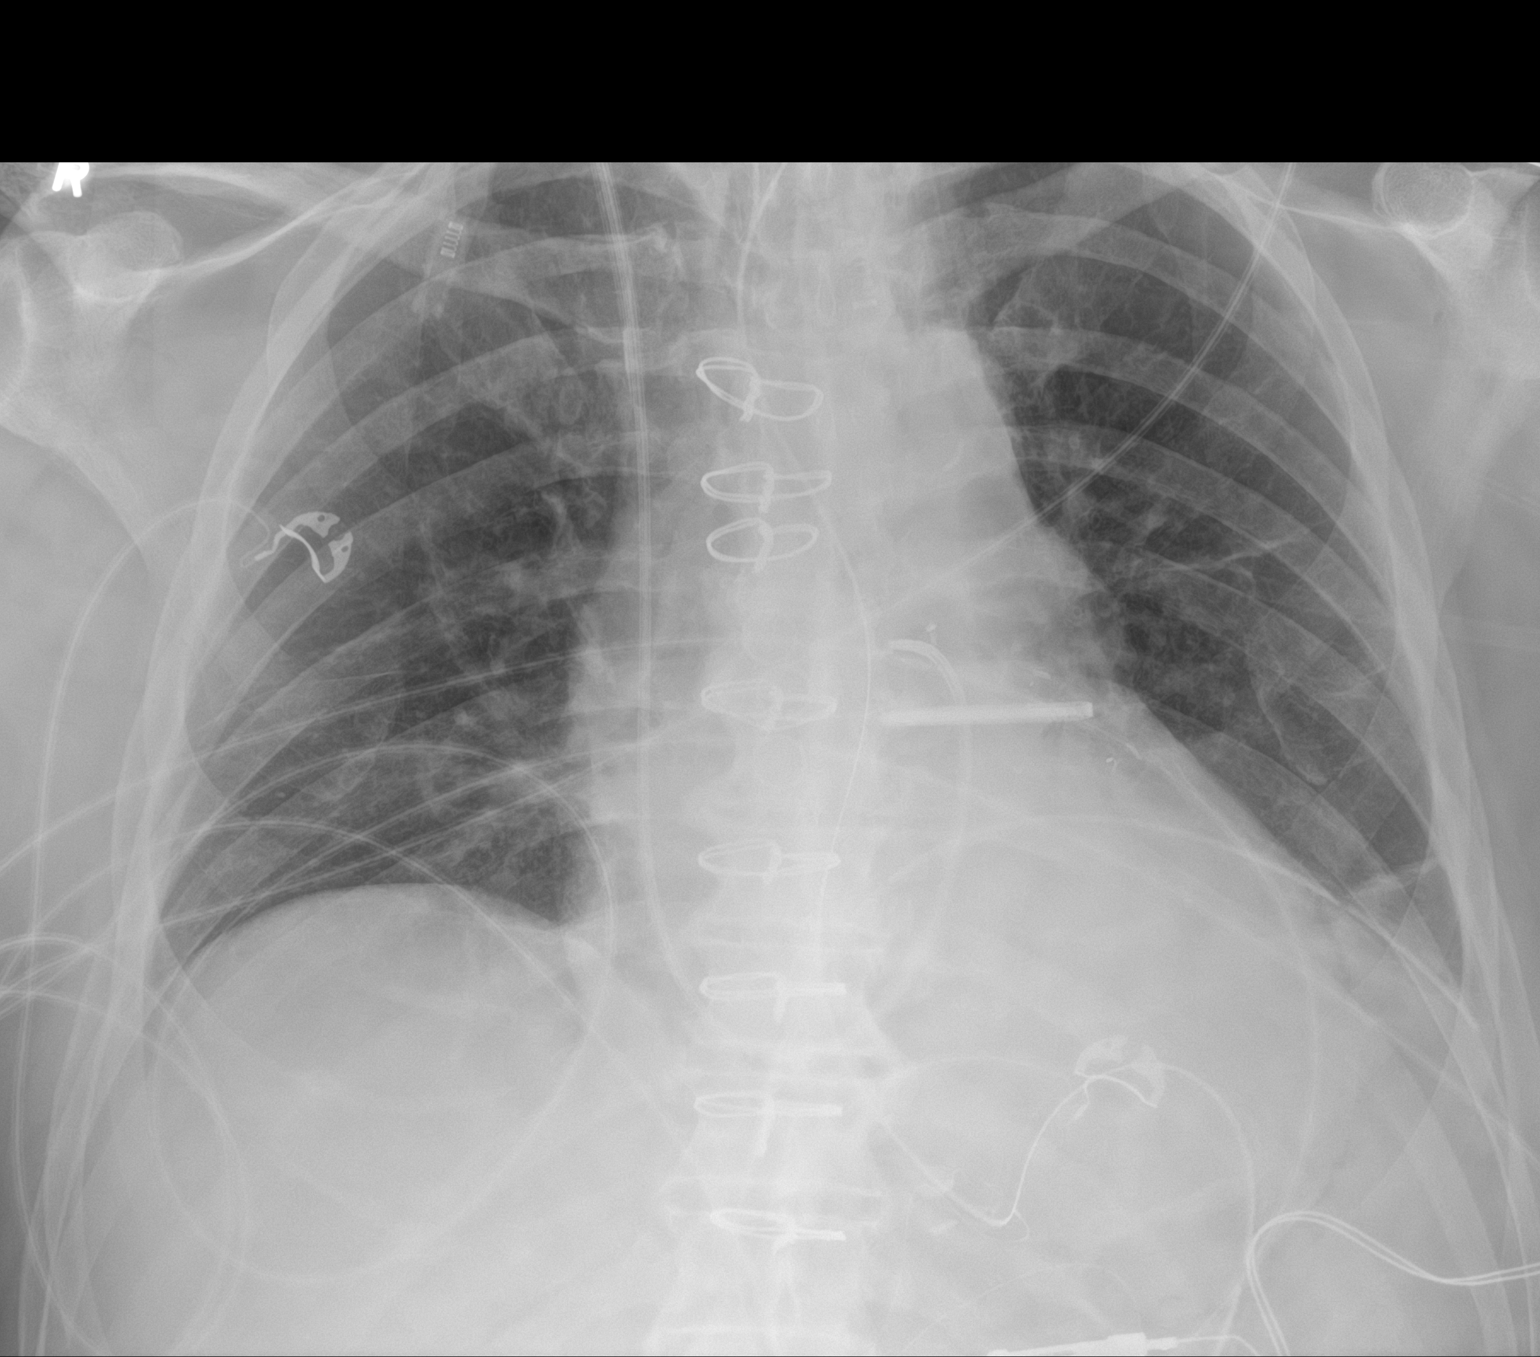

[1 of 1 positions shown; findings below may reference images not displayed]

FINDINGS: Endotracheal tube tip is 6.6 cm above the carina. Enteric tube
enters stomach with the tip not seen on this image. Right internal
jugular Swan-Ganz catheter terminates in the main pulmonary artery.
Intact sternotomy wires. Left-sided mediastinal drain and left
basilar chest tube in place. Stable cardiomediastinal silhouette
with top-normal heart size. Tiny left apical pneumothorax, less than
5%. No right pneumothorax. No pleural effusion. No pulmonary edema.
Mild left basilar atelectasis.
IMPRESSION: 1. Tiny left apical pneumothorax, less than 5%. Left chest tube in
place.
2. Well-positioned support structures.
3. Mild left basilar atelectasis.

## 2022-11-26 IMAGING — DX DG CHEST 1V PORT
1 series · 1 of 1 positions shown · non-contrast
Comparison: Chest radiograph 1 day prior

CLINICAL DATA: Status post open heart surgery, chest tube present

EXAM:
PORTABLE CHEST 1 VIEW

[chest ap]
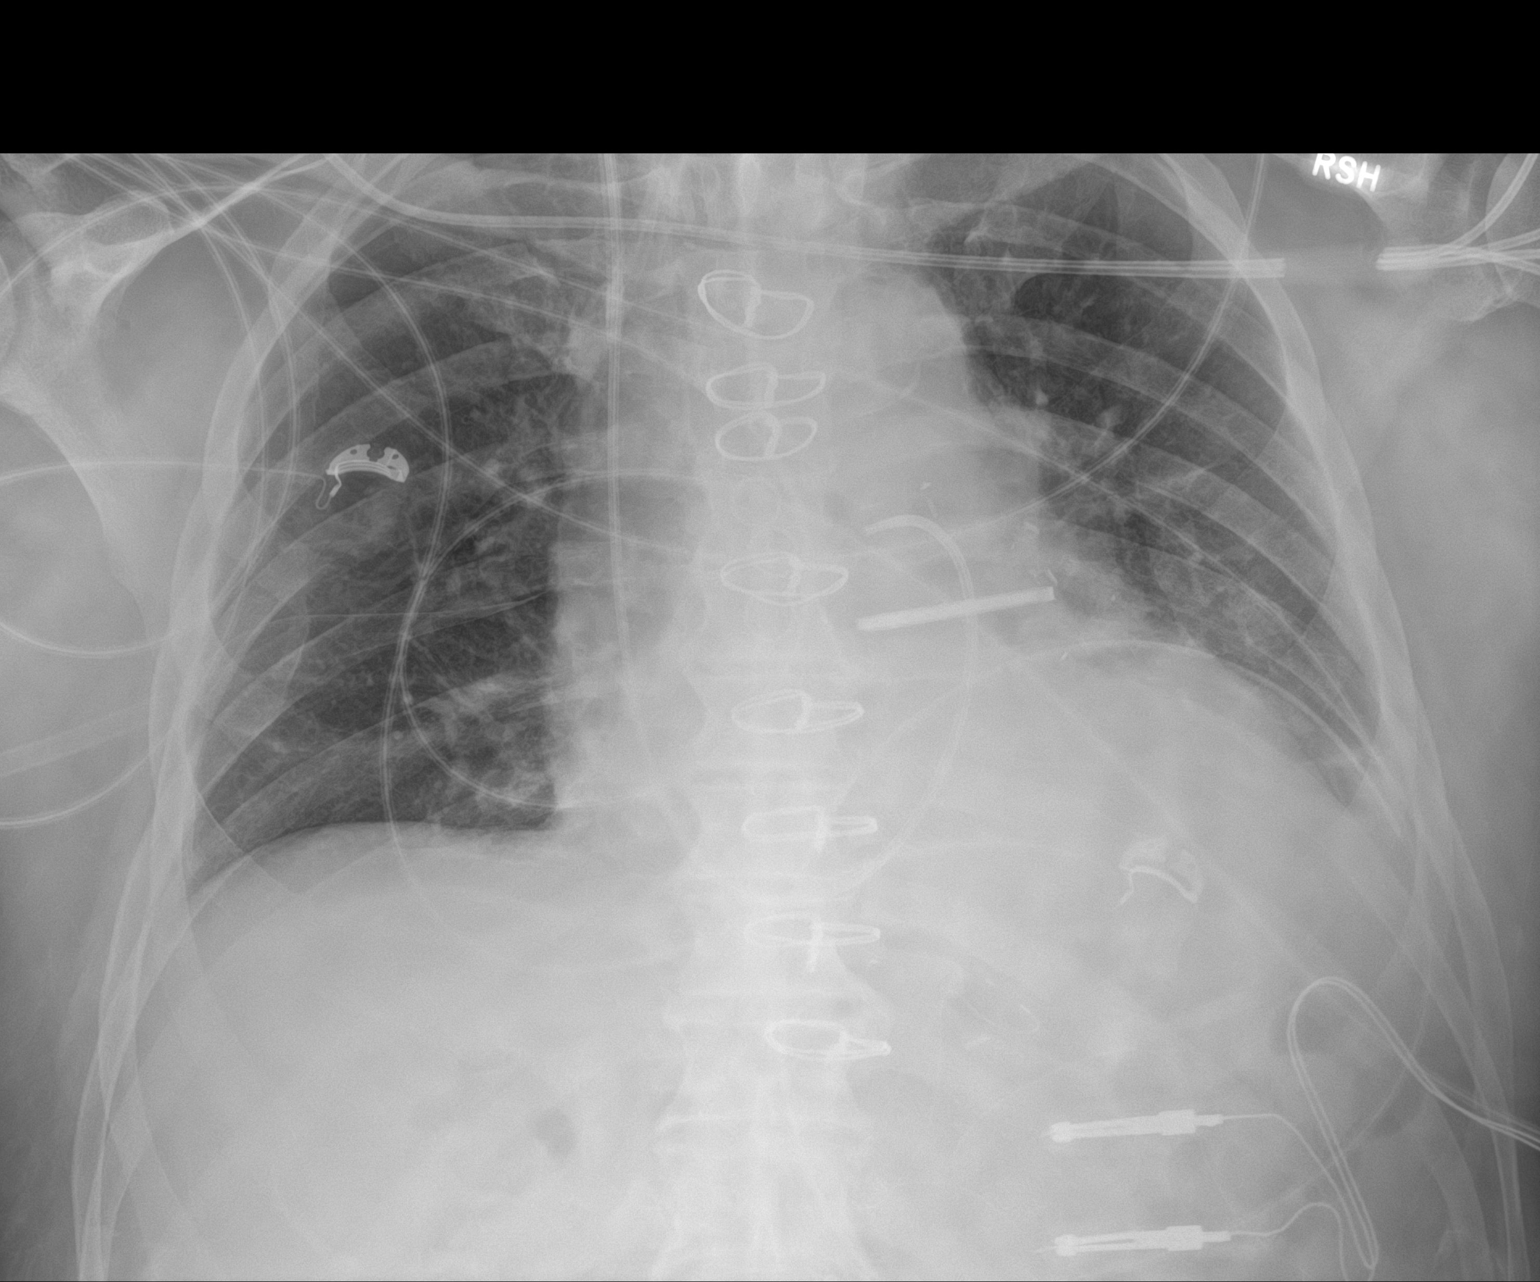

[1 of 1 positions shown; findings below may reference images not displayed]

FINDINGS: The endotracheal tube has been removed. The enteric catheter has
been removed. A right IJ Swan-Ganz catheter is in stable position
terminating in the proximal right pulmonary artery. Median
sternotomy wires are stable. A left basilar chest tube is stable.

The cardiomediastinal silhouette is stable.

Dense retrocardiac opacity is again seen, slightly worsened in the
interim. There is a trace left pleural effusion. The previously seen
tiny left apical pneumothorax is not seen on the current study. The
right lung is clear. There is no right pneumothorax.
IMPRESSION: 1. Support devices as above.
2. Worsening opacity in the left base likely reflecting atelectasis.
3. The previously seen tiny left apical pneumothorax is no longer
appreciated.

## 2022-11-27 IMAGING — DX DG CHEST 1V PORT
1 series · 1 of 1 positions shown · non-contrast
Comparison: 1 day prior

CLINICAL DATA: Chest pain

EXAM:
PORTABLE CHEST 1 VIEW

[chest]
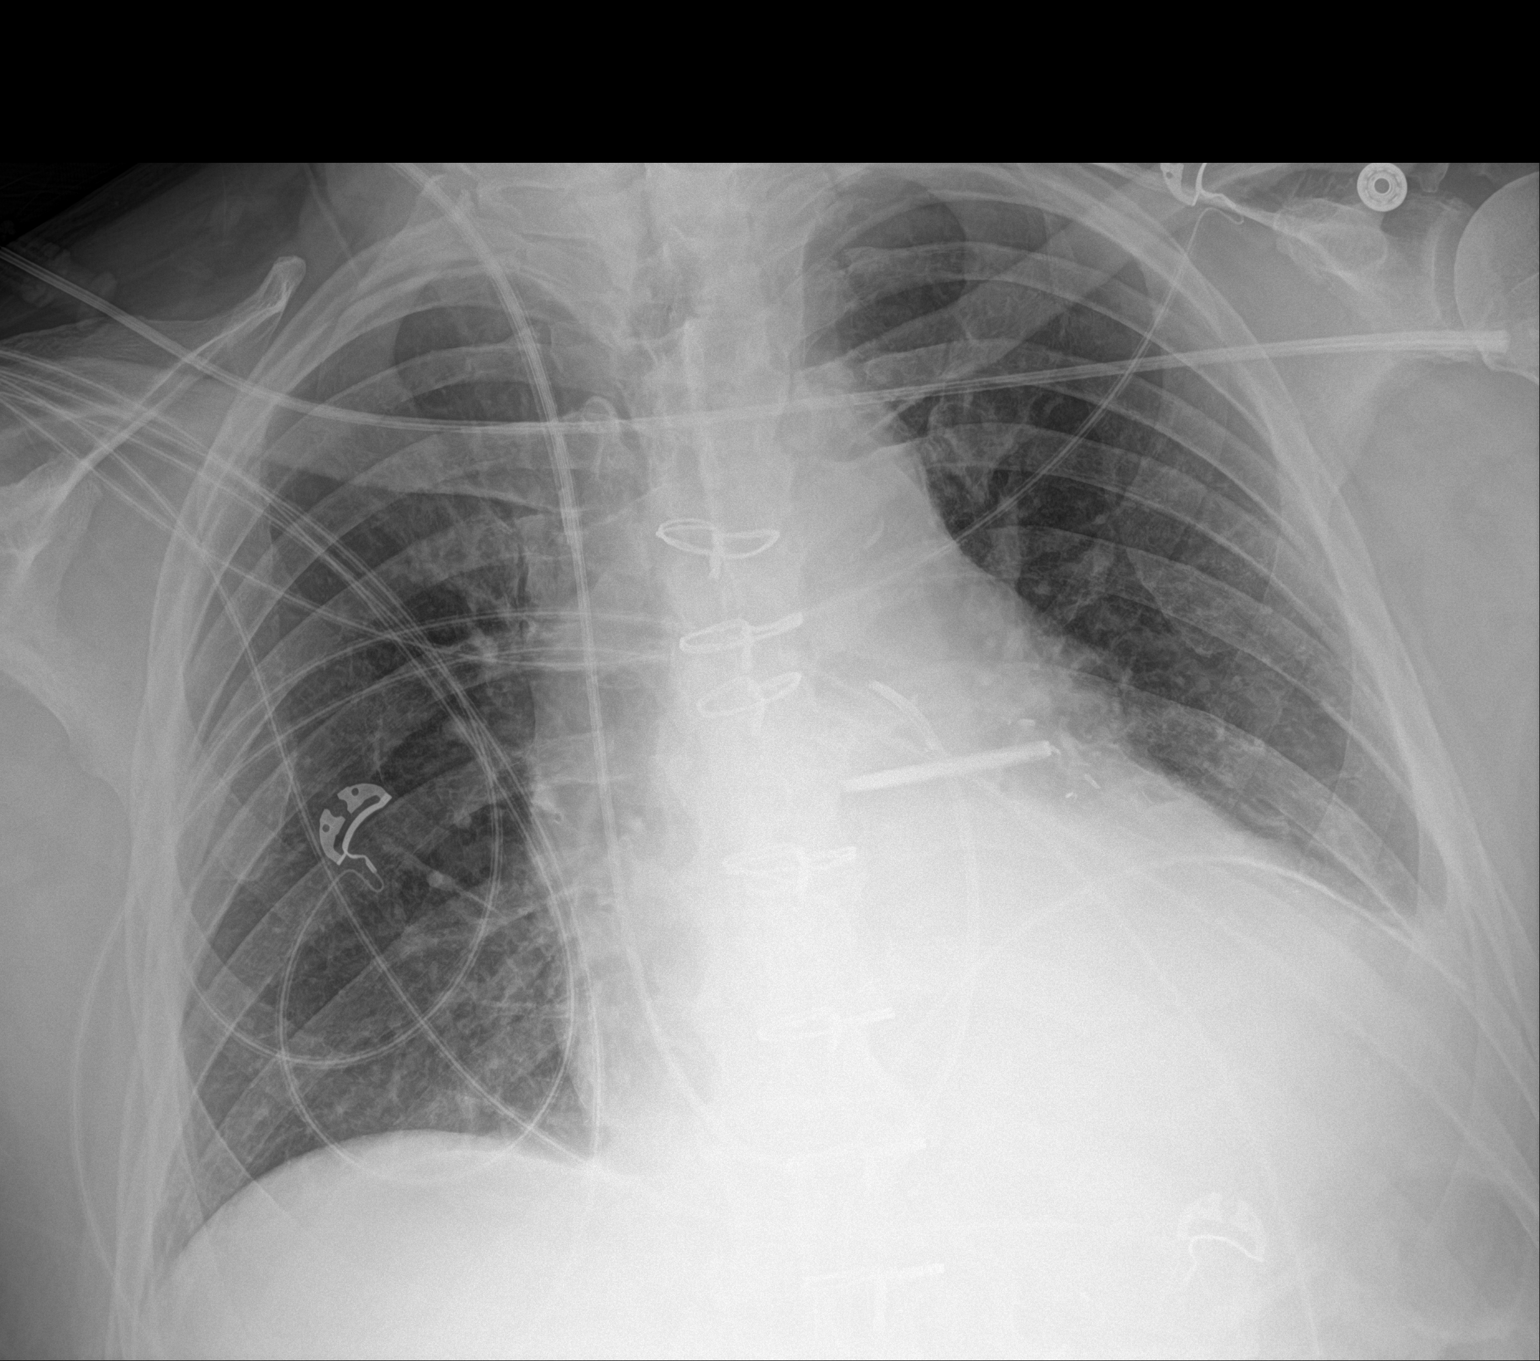

[1 of 1 positions shown; findings below may reference images not displayed]

FINDINGS: Right IJ Swan-Ganz catheter tip at outflow tract. Median sternotomy
with left atrial appendage occlusion device.

Numerous leads and wires project over the chest. Midline trachea.
Cardiomegaly accentuated by AP portable technique. Small left
pleural effusion. No pneumothorax. Left hemidiaphragm elevation with
persistent, slightly increased left base airspace disease.
IMPRESSION: Worsened left base airspace disease, favoring atelectasis.

Cardiomegaly without congestive failure.

Similar small left pleural effusion.

## 2022-11-28 IMAGING — DX DG CHEST 1V PORT
1 series · 1 of 1 positions shown · non-contrast
Comparison: Chest x-rays dated 02/28/2021 and 02/27/2021.

CLINICAL DATA: Pneumothorax

EXAM:
PORTABLE CHEST 1 VIEW

[chest]
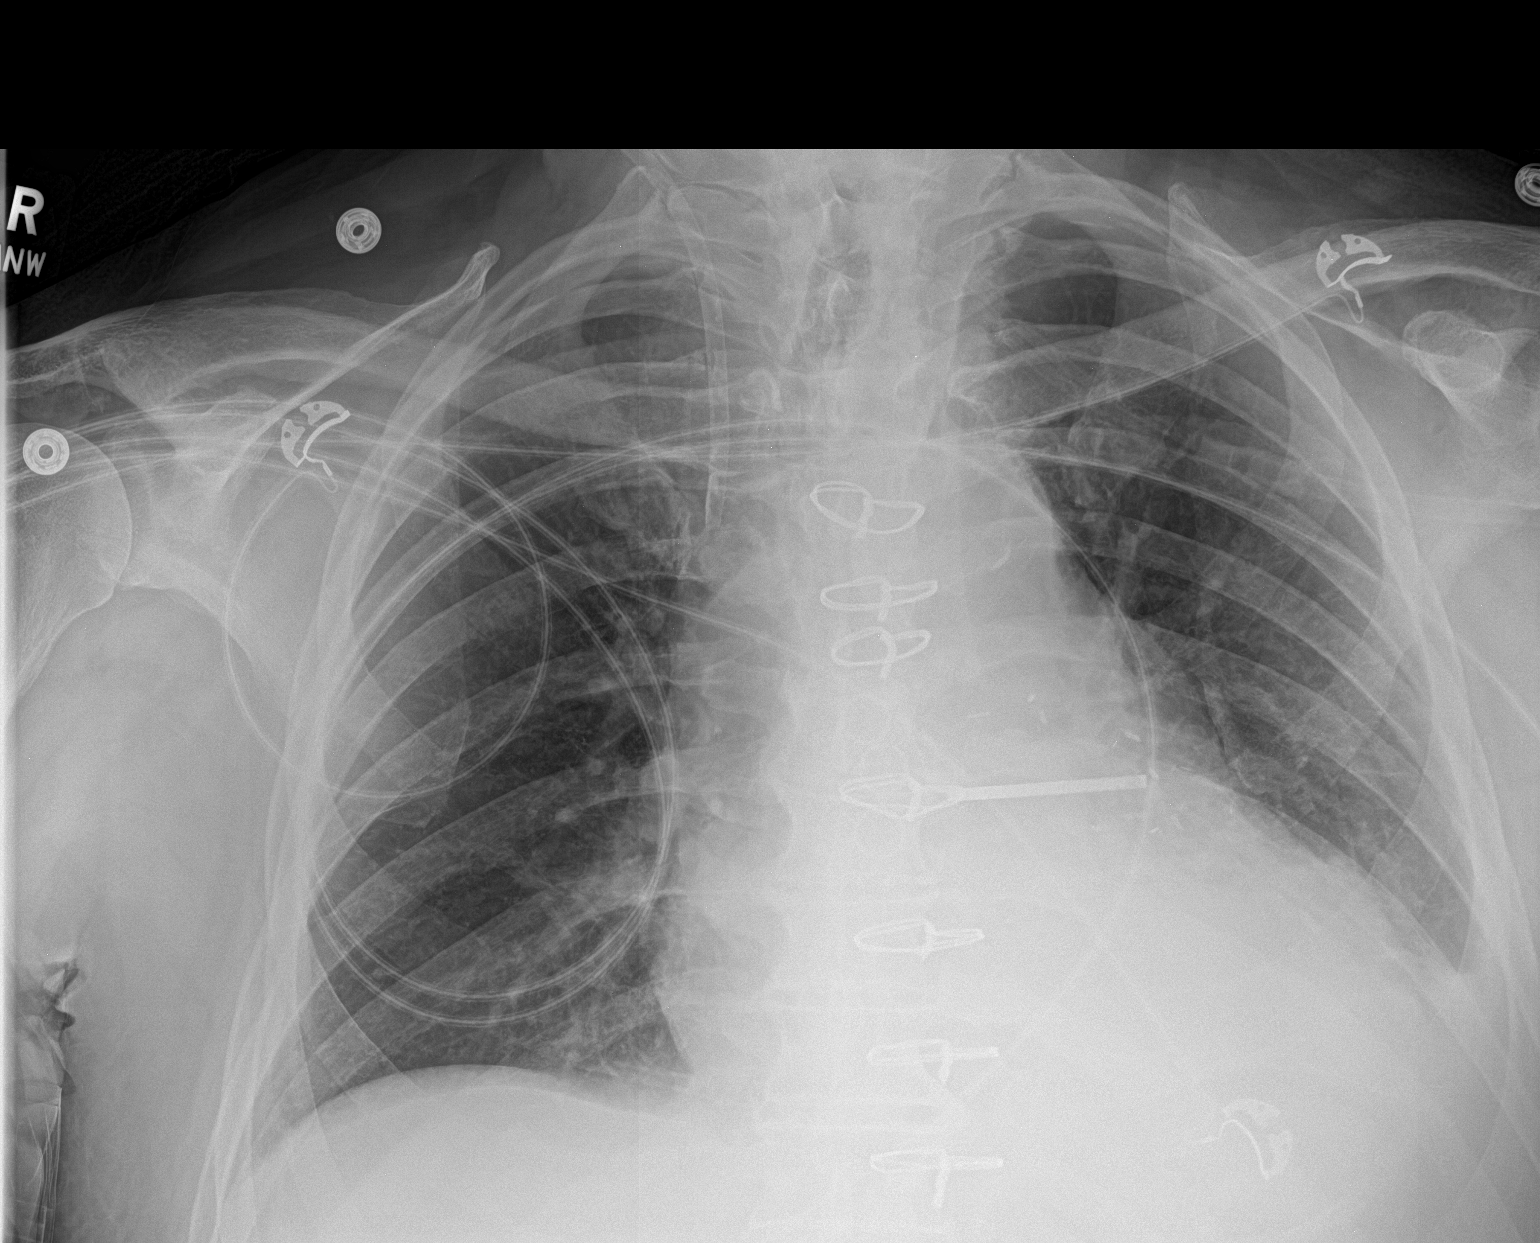

[1 of 1 positions shown; findings below may reference images not displayed]

FINDINGS: Stable cardiomegaly. Median sternotomy wires in place. Swan-Ganz
catheter has been removed. RIGHT IJ Cordis is stable in position.

Stable opacity at the LEFT lung base, likely atelectasis and/or
small pleural effusion. Lungs are otherwise clear. No pneumothorax
is seen.
IMPRESSION: 1. Stable opacity at the LEFT lung base, likely atelectasis and/or
small pleural effusion. No new lung findings. No pneumothorax is
seen.
2. Stable cardiomegaly.

## 2022-12-03 DIAGNOSIS — G4733 Obstructive sleep apnea (adult) (pediatric): Secondary | ICD-10-CM | POA: Diagnosis not present

## 2022-12-07 DIAGNOSIS — E1142 Type 2 diabetes mellitus with diabetic polyneuropathy: Secondary | ICD-10-CM | POA: Diagnosis not present

## 2022-12-07 DIAGNOSIS — L97511 Non-pressure chronic ulcer of other part of right foot limited to breakdown of skin: Secondary | ICD-10-CM | POA: Diagnosis not present

## 2022-12-15 ENCOUNTER — Ambulatory Visit: Payer: Self-pay | Admitting: Licensed Clinical Social Worker

## 2022-12-15 DIAGNOSIS — I10 Essential (primary) hypertension: Secondary | ICD-10-CM | POA: Diagnosis not present

## 2022-12-15 DIAGNOSIS — E1165 Type 2 diabetes mellitus with hyperglycemia: Secondary | ICD-10-CM | POA: Diagnosis not present

## 2022-12-15 NOTE — Patient Outreach (Signed)
  Care Coordination   12/15/2022 Name: Kari Lather [facility] MRN: [id number] DOB: Apr 04, 1946   Care Coordination Outreach Attempts:  An unsuccessful telephone outreach was attempted today to offer the patient information about available care coordination services.  Follow Up Plan:  Additional outreach attempts will be made to offer the patient care coordination information and services.   Encounter Outcome:  No Answer   Care Coordination Interventions:  No, not indicated    Kelton Pillar.Geetika Laborde MSW, LCSW Licensed Visual merchandiser [facility] Care Management [phone]

## 2022-12-17 DIAGNOSIS — S91109A Unspecified open wound of unspecified toe(s) without damage to nail, initial encounter: Secondary | ICD-10-CM | POA: Diagnosis not present

## 2022-12-17 DIAGNOSIS — L97511 Non-pressure chronic ulcer of other part of right foot limited to breakdown of skin: Secondary | ICD-10-CM | POA: Diagnosis not present

## 2022-12-17 DIAGNOSIS — L97909 Non-pressure chronic ulcer of unspecified part of unspecified lower leg with unspecified severity: Secondary | ICD-10-CM | POA: Diagnosis not present

## 2022-12-21 DIAGNOSIS — L97511 Non-pressure chronic ulcer of other part of right foot limited to breakdown of skin: Secondary | ICD-10-CM | POA: Diagnosis not present

## 2022-12-29 NOTE — Telephone Encounter (Signed)
done

## 2022-12-31 ENCOUNTER — Other Ambulatory Visit: Payer: Self-pay | Admitting: Cardiology

## 2023-01-03 DIAGNOSIS — G4733 Obstructive sleep apnea (adult) (pediatric): Secondary | ICD-10-CM | POA: Diagnosis not present

## 2023-01-04 DIAGNOSIS — L97511 Non-pressure chronic ulcer of other part of right foot limited to breakdown of skin: Secondary | ICD-10-CM | POA: Diagnosis not present

## 2023-01-11 DIAGNOSIS — Z125 Encounter for screening for malignant neoplasm of prostate: Secondary | ICD-10-CM | POA: Diagnosis not present

## 2023-01-11 DIAGNOSIS — Z79899 Other long term (current) drug therapy: Secondary | ICD-10-CM | POA: Diagnosis not present

## 2023-01-11 DIAGNOSIS — Z87891 Personal history of nicotine dependence: Secondary | ICD-10-CM | POA: Diagnosis not present

## 2023-01-11 DIAGNOSIS — R5383 Other fatigue: Secondary | ICD-10-CM | POA: Diagnosis not present

## 2023-01-11 DIAGNOSIS — Z1331 Encounter for screening for depression: Secondary | ICD-10-CM | POA: Diagnosis not present

## 2023-01-11 DIAGNOSIS — E039 Hypothyroidism, unspecified: Secondary | ICD-10-CM | POA: Diagnosis not present

## 2023-01-11 DIAGNOSIS — Z0000 Encounter for general adult medical examination without abnormal findings: Secondary | ICD-10-CM | POA: Diagnosis not present

## 2023-01-11 DIAGNOSIS — Z299 Encounter for prophylactic measures, unspecified: Secondary | ICD-10-CM | POA: Diagnosis not present

## 2023-01-11 DIAGNOSIS — Z1339 Encounter for screening examination for other mental health and behavioral disorders: Secondary | ICD-10-CM | POA: Diagnosis not present

## 2023-01-11 DIAGNOSIS — E7800 Pure hypercholesterolemia, unspecified: Secondary | ICD-10-CM | POA: Diagnosis not present

## 2023-01-11 DIAGNOSIS — I10 Essential (primary) hypertension: Secondary | ICD-10-CM | POA: Diagnosis not present

## 2023-01-11 DIAGNOSIS — Z7189 Other specified counseling: Secondary | ICD-10-CM | POA: Diagnosis not present

## 2023-01-15 DIAGNOSIS — I10 Essential (primary) hypertension: Secondary | ICD-10-CM | POA: Diagnosis not present

## 2023-01-15 DIAGNOSIS — E1165 Type 2 diabetes mellitus with hyperglycemia: Secondary | ICD-10-CM | POA: Diagnosis not present

## 2023-01-20 ENCOUNTER — Ambulatory Visit: Payer: Medicare PPO | Admitting: Cardiology

## 2023-01-20 ENCOUNTER — Encounter: Payer: Self-pay | Admitting: Cardiology

## 2023-01-20 VITALS — BP 153/81 | HR 63 | Resp 16 | Ht 78.0 in | Wt 193.0 lb

## 2023-01-20 DIAGNOSIS — I255 Ischemic cardiomyopathy: Secondary | ICD-10-CM | POA: Diagnosis not present

## 2023-01-20 DIAGNOSIS — R809 Proteinuria, unspecified: Secondary | ICD-10-CM

## 2023-01-20 DIAGNOSIS — I350 Nonrheumatic aortic (valve) stenosis: Secondary | ICD-10-CM

## 2023-01-20 DIAGNOSIS — I5032 Chronic diastolic (congestive) heart failure: Secondary | ICD-10-CM | POA: Diagnosis not present

## 2023-01-20 DIAGNOSIS — Z79899 Other long term (current) drug therapy: Secondary | ICD-10-CM | POA: Diagnosis not present

## 2023-01-20 DIAGNOSIS — Z9889 Other specified postprocedural states: Secondary | ICD-10-CM | POA: Diagnosis not present

## 2023-01-20 DIAGNOSIS — E1129 Type 2 diabetes mellitus with other diabetic kidney complication: Secondary | ICD-10-CM | POA: Diagnosis not present

## 2023-01-20 DIAGNOSIS — Z7901 Long term (current) use of anticoagulants: Secondary | ICD-10-CM | POA: Diagnosis not present

## 2023-01-20 DIAGNOSIS — Z951 Presence of aortocoronary bypass graft: Secondary | ICD-10-CM

## 2023-01-20 DIAGNOSIS — Z8679 Personal history of other diseases of the circulatory system: Secondary | ICD-10-CM

## 2023-01-20 DIAGNOSIS — Z79890 Hormone replacement therapy (postmenopausal): Secondary | ICD-10-CM | POA: Diagnosis not present

## 2023-01-20 DIAGNOSIS — E782 Mixed hyperlipidemia: Secondary | ICD-10-CM

## 2023-01-20 DIAGNOSIS — I480 Paroxysmal atrial fibrillation: Secondary | ICD-10-CM

## 2023-01-20 MED ORDER — RANOLAZINE ER 500 MG PO TB12: 500.0000 mg | ORAL_TABLET | Freq: Two times a day (BID) | ORAL | 3 refills | Status: DC

## 2023-01-20 NOTE — Progress Notes (Signed)
ID:  Derek Bond, DOB 05/01/1946, MRN [id number]  PCP:  Ignatius Specking, MD  Cardiologist:  Tessa Lerner, DO, FACC (established care 01/26/2021)  Date: 01/20/23 Last Office Visit: 10/19/2022  Chief Complaint  Patient presents with   Coronary Artery Disease    heart failure management    HPI  Derek Bond is a 77 y.o. male whose past medical history and cardiovascular risk factors include: Ischemic cardiomyopathy, heart failure with improved EF, CAD s/p CABG / Clipping of atrial appendage using 45mm atrial clip, aortic stenosis,  paroxysmal atrial fibrillation status post DDCV (06/10/2021), hypertension, diabetes mellitus with microalbuminuria, sleep apnea, erectile dysfunction, heartburn, former smoker, hyperlipidemia, sleep apnea not on CPAP,, advanced age.  In 2022 patient would have recurrent hospitalizations for chest pain and progressive dyspnea.  He underwent angiography and was noted to have multivessel CAD and underwent four-vessel bypass.  Given his atrial fibrillation he also underwent left atrial Maze procedure with a 45 mm atrial clip placement and was discharged home in October 2022.  Since then he has been managed as outpatient given his medically managed aortic stenosis, nonischemic cardiomyopathy, heart failure with improved EF, and paroxysmal A-fib.  For his atrial fibrillation he is currently on AV nodal blocking agents, antiarrhythmic medications, and thromboembolic prophylaxis with Eliquis.  He did undergo TEE guided cardioversion in January 2023 which restored normal sinus rhythm.  Since last office visit patient denies anginal chest pain or heart failure symptoms.  No use of sublingual nitroglycerin tablets since last office encounter.  He was on methotrexate for rheumatoid arthritis but since the last visit has discontinued it because he is on antiarrhythmics.  Patient states that his PCP is aware.  Patient is accompanied by his daughter Eber Jones at today's office  visit also provides collateral history.   ALLERGIES: Allergies  Allergen Reactions   Bee Venom Swelling    MEDICATION LIST PRIOR TO VISIT: Current Meds  Medication Sig   acetaminophen (TYLENOL) 500 MG tablet Take 1,000 mg by mouth every 6 (six) hours as needed for moderate pain or mild pain.   aspirin EC 81 MG tablet Take 81 mg by mouth daily.   ELIQUIS 5 MG TABS tablet TAKE 1 TABLET (5 MG TOTAL) BY MOUTH 2 (TWO) TIMES DAILY.   ENTRESTO 97-103 MG TAKE 1 TABLET TWICE DAILY   famotidine (PEPCID) 40 MG tablet Take 40 mg by mouth at bedtime.   FARXIGA 10 MG TABS tablet TAKE 1 TABLET EVERY DAY   folic acid (FOLVITE) 1 MG tablet Take 2 mg by mouth daily.   glipiZIDE (GLUCOTROL XL) 10 MG 24 hr tablet Take 10 mg by mouth 2 (two) times daily.   magnesium oxide (MAG-OX) 400 (240 Mg) MG tablet TAKE 1 TABLET EVERY DAY   metFORMIN (GLUCOPHAGE) 1000 MG tablet Take 1,000 mg by mouth daily with breakfast.   metoprolol succinate (TOPROL-XL) 25 MG 24 hr tablet TAKE 1 TABLET EVERY MORNING   MULTAQ 400 MG tablet TAKE 1 TABLET (400 MG TOTAL) BY MOUTH 2 (TWO) TIMES DAILY WITH A MEAL.   nitroGLYCERIN (NITROSTAT) 0.4 MG SL tablet Place under the tongue.   pantoprazole (PROTONIX) 40 MG tablet Take 40 mg by mouth every evening.   potassium chloride (KLOR-CON M) 10 MEQ tablet TAKE 1 TABLET (10 MEQ TOTAL) BY MOUTH DAILY.   rosuvastatin (CRESTOR) 5 MG tablet Take 5 mg by mouth at bedtime.   [DISCONTINUED] methotrexate (RHEUMATREX) 2.5 MG tablet Take 2.5 mg by mouth as directed.   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    Due to laceration at age 31    Hiatal hernia    History of melanoma     Obstructive sleep apnea    Type 2 diabetes mellitus (HCC)     PAST SURGICAL HISTORY: Past Surgical History:  Procedure Laterality Date   BIOPSY  12/21/2019   Procedure: BIOPSY;  Surgeon: Dolores Frame, MD;  Location: [facility];  Service: Gastroenterology;;  random colon   CARDIOVERSION N/A 06/10/2021   Procedure: CARDIOVERSION;  Surgeon: Tessa Lerner, DO;  Location: [facility];  Service: Cardiovascular;  Laterality: N/A;   CLIPPING OF ATRIAL APPENDAGE N/A 02/26/2021   Procedure: CLIPPING OF ATRIAL APPENDAGE USING ATRICURE CLIP SIZE 45;  Surgeon: Corliss Skains, MD;  Location: [facility] OR;  Service: Open Heart Surgery;  Laterality: N/A;   COLONOSCOPY WITH PROPOFOL N/A 12/21/2019   Procedure: COLONOSCOPY WITH PROPOFOL;  Surgeon: Dolores Frame, MD;  Location: [facility];  Service: Gastroenterology;  Laterality: N/A;  915   CORONARY ARTERY BYPASS GRAFT N/A 02/26/2021   Procedure: CORONARY ARTERY BYPASS GRAFTING (CABG) X4, ON PUMP, USING LEFT INTERNAL MAMMARY ARTERY AND RIGHT ENDOSCOPICALLY HARVESTED GREATER SAPHENOUS VEIN;  Surgeon: Corliss Skains, MD;  Location: [facility] OR;  Service: Open Heart Surgery;  Laterality: N/A;   ENDOVEIN HARVEST OF GREATER SAPHENOUS VEIN Right 02/26/2021   Procedure: ENDOVEIN HARVEST OF GREATER SAPHENOUS VEIN;  Surgeon: Corliss Skains, MD;  Location: [facility] OR;  Service: Open Heart Surgery;  Laterality: Right;   MAZE N/A 02/26/2021   Procedure: MAZE;  Surgeon: Corliss Skains, MD;  Location: [facility] OR;  Service: Open Heart Surgery;  Laterality: N/A;   MELANOMA EXCISION     Left facial region   REPLACEMENT TOTAL KNEE Left    RIGHT/LEFT HEART CATH AND CORONARY ANGIOGRAPHY N/A 02/23/2021   Procedure: RIGHT/LEFT HEART CATH AND CORONARY ANGIOGRAPHY;  Surgeon: Elder Negus, MD;  Location: [facility];  Service: Cardiovascular;  Laterality: N/A;   TEE WITHOUT CARDIOVERSION N/A 02/26/2021   Procedure: TRANSESOPHAGEAL  ECHOCARDIOGRAM (TEE);  Surgeon: Corliss Skains, MD;  Location: [facility];  Service: Open Heart Surgery;  Laterality: N/A;   TEE WITHOUT CARDIOVERSION N/A 06/10/2021   Procedure: TRANSESOPHAGEAL ECHOCARDIOGRAM (TEE);  Surgeon: Tessa Lerner, DO;  Location: [facility];  Service: Cardiovascular;  Laterality: N/A;   UMBILICAL HERNIA REPAIR      FAMILY HISTORY: The patient family history includes Diabetes in his brother, mother, and another family member; Heart attack in his brother and brother; Heart disease in his sister; Hypertension in his brother, brother, sister, sister, and another family member; Lung cancer in his father and sister; Rheum arthritis in an other family member; Stroke in an other family member.  SOCIAL HISTORY:  The patient  reports that he quit smoking about 33 years ago. His smoking use included cigarettes. He started smoking about 63 years ago. He has a 60 pack-year smoking history. He has never used smokeless tobacco. He reports that he does not currently use alcohol. He reports that he does not use drugs.  REVIEW OF SYSTEMS: Review of Systems  Constitutional: Negative for weight gain.  Cardiovascular:  Negative for chest pain, claudication, dyspnea on exertion, irregular heartbeat, leg swelling, near-syncope, orthopnea, palpitations, paroxysmal nocturnal dyspnea and syncope.  Respiratory:  Negative for shortness of breath.   Hematologic/Lymphatic: Negative for bleeding problem.  Musculoskeletal:  Negative for muscle cramps and myalgias.  Neurological:  Negative for dizziness and light-headedness.    PHYSICAL EXAM:    01/20/2023   11:34 AM 10/24/2022    6:49 PM 10/24/2022    3:31 PM  Vitals with BMI  Height 6\' 6"     Weight 193 lbs    BMI 22.31    Systolic 153 131 829  Diastolic 81 72 84  Pulse 63 60 83   Physical Exam  Constitutional: No distress.  Age appropriate, hemodynamically stable.   Neck: No JVD present.  Cardiovascular: Normal rate, regular rhythm, S1  normal, S2 normal and intact distal pulses. Exam reveals no gallop, no S3 and no S4.  Murmur heard. Crescendo-decrescendo systolic murmur is present with a grade of 3/6. Pulses:      Dorsalis pedis pulses are 2+ on the right side and 2+ on the left side.       Posterior tibial pulses are 1+ on the right side and 1+ on the left side.  Circular discoloration, black, right second digit (see media section).   Pulmonary/Chest: Effort normal and breath sounds normal. No stridor. He has no wheezes. He has no rales.  sternotomy site well-healed.  Abdominal: Soft. Bowel sounds are normal. He exhibits no distension. There is no abdominal tenderness.  Musculoskeletal:        General: No edema.     Cervical back: Neck supple.  Neurological: He is alert and oriented to person, place, and time. He has intact cranial nerves (2-12).  Skin: Skin is warm and moist.   CARDIAC DATABASE: 02/26/2021: CABG x4 (LIMA to LAD, SVG to DIAGONAL, SVG to OM, SVG to PLB) + MAZE + 45 mm atrial clip placement.  06/10/2021: TEE guided cardioversion for restoration of normal sinus rhythm with synchronized biphasic 200 J x 2  EKG: 01/26/2021: Atrial fibrillation with rapid ventricular rate 126 bpm, left axis deviation, left anterior fascicular block, left bundle branch block, consider old inferior infarct.  October 19, 2022: Sinus rhythm, 71 bpm, left bundle branch block, left axis deviation  Echocardiogram: 02/05/2021:  Severely depressed LV systolic function with visual EF 25-30%. Hypokinetic global wall motion with regionality involving septal wall. Left ventricle cavity is mildly dilated. Moderate left ventricular hypertrophy. Unable to evaluate diastolic function due to atrial fibrillation. Elevated LAP.  Moderate (Grade II) mitral regurgitation.  Moderate tricuspid regurgitation. Mild pulmonary hypertension, RVSP .  IVC is dilated with a respiratory response of <50%.  No prior study for comparison.  02/25/2021:   1. Left ventricular ejection fraction, by estimation, is 30 to 35%. The left ventricle has moderately decreased function. The left ventricle demonstrates global hypokinesis. There is moderate left ventricular hypertrophy. Left ventricular diastolic function could not be evaluated.   2. Right ventricular systolic function is normal. The right ventricular size is mildly enlarged. There is normal pulmonary artery systolic pressure.   3. Left atrial size was severely dilated.   4. Right atrial size was dilated.   5. The mitral valve is normal in structure. Mild mitral valve regurgitation. No evidence of mitral stenosis.   6. The aortic valve is tricuspid. Aortic valve regurgitation is not visualized.   7. There is mild dilatation of the ascending aorta, measuring  40 mm.   12/13/2021: LVEF 40-45%, G1DD, moderate AS, proximal As Ao 42mm, estimated RAP .   06/29/2022: Mildly depressed LV systolic function with visual EF 45-50%. Left ventricle cavity is normal in size. Mild left ventricular hypertrophy. Normal global wall motion. Abnormal septal wall motion due to post-operative septum. Doppler evidence of grade II (pseudonormal) diastolic dysfunction, normal LAP.  Left atrial cavity is mildly dilated at 37.1 ml/m^2. Native trileaflet aortic valve with no regurgitation. Aortic valve sclerosis with mild to moderate aortic valve stenosis. Peak velocity 2.36m/s, Peak Pressure Gradient 32 mmHg, Mean Gradient 14.4 mmHg, AVA 1.4cm, Dimensionless Index 0.4 Mild (Grade I) mitral regurgitation. Moderate tricuspid regurgitation. Mild pulmonary hypertension. RVSP measures 36 mmHg. The aortic root is normal. Proximal ascending aorta dilated, 42 mm. Compared to 12/13/2021 LVEF improved from 40-45% to 45-50%, G1DD is now G2DD, moderate AS is now mild/moderate, proximal As Ao remains stable, mild PHTN is new.   10/26/2022: Stress of life, LVEF 45-50%, LV cavity mildly dilated, moderate LVH, global longitudinal  strain -15%, diastolic function indeterminate, right ventricular size and function normal, moderate LAE, mild MR, moderate MAC without stenosis, moderate TR, moderate aortic stenosis (peak velocity 2.6 m/s, peak gradient 27 mmHg, mean gradient 15 mmHg, AVA per VTI 1.34 cm, dimensional index 0.35), aortic root 39 mm, ascending aorta 41 mm, estimated RAP 3 mmHg.  Stress Testing: MPI 12/14/2021 1. No reversible perfusion defects identified. Small in size, mild in severity fixed defect is noted involving the basal inferior and mid inferior segments compatible with infarction involving the RCA.  Medium in size, mild in severity fixed defect involves the apical septal, apical inferior and apicolateral and apical segments. This is compatible with infarct involving the LAD.   2. Moderate apical and inferior hypokinesis with paradoxical motion of the septum. No left ventricular dilatation.   3. Left ventricular ejection fraction 40%, decreased from 55% on the previous exam.   4. Non invasive risk stratification*: Intermediate  Heart Catheterization: Left and right heart catheterization 02/23/2021 LM: Normal LAD: Prox 80% stenosius at bifurcation with small to medium caliber D1 with 75% long prox disease. Mid 70% disease Ramus: Minimal luminal irregularities Lcx: OM1 with prox 80% stenosis RCA: 60-70% disease in PL branches   RA: 2 mmHg RV: 22/0 mmHg PA: 21/5 mmHg, mPAP 13 mmHg PCW: 8 mmHg   CO: 5.6 L/min CI: 2.5 L/min/m2     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%, diabetic patient CVTS consult for CABG  NONINVASIVE PHYSIOLOGIC VASCULAR STUDY OF BILATERAL LOWER EXTREMITIES August 2024:  Right:  Resting ABI within normal limits. Segmental exam performed at the ankle demonstrates early tibial arterial disease in the distribution of the anterior tibial artery.  Left: Resting ABI within normal limits. Segmental exam at the left ankle demonstrates waveforms  relativelymaintained.  The left toe PPG is suggestive of small vessel disease in the foot    LABORATORY DATA:    Latest Ref Rng & Units 10/24/2022    4:32 PM 10/08/2022   10:38 AM 12/13/2021    7:50 AM  CBC  WBC 4.0 - 10.5 K/uL 5.4   5.9   Hemoglobin 13.0 - 17.0 g/dL 65.7  84.6  96.2   Hematocrit 39.0 - 52.0 % 42.7  44.4  42.8   Platelets 150 - 400 K/uL 235   202        Latest Ref Rng & Units 10/24/2022    4:32 PM 10/08/2022   10:38 AM 07/15/2022   11:12 AM  CMP  Glucose 70 - 99 mg/dL 161  096  045   BUN 8 - 23 mg/dL 19  16  20    Creatinine 0.61 - 1.24 mg/dL 4.09  8.11  9.14   Sodium 135 - 145 mmol/L 135  140  140   Potassium 3.5 - 5.1 mmol/L 4.1  4.6  4.9   Chloride 98 - 111 mmol/L 104  103  103   CO2 22 - 32 mmol/L 24  22  21    Calcium 8.9 - 10.3 mg/dL 9.4  78.2  95.6     Lipid Panel     Component Value Date/Time   CHOL 120 12/13/2021 1035   TRIG 59 12/13/2021 1035   HDL 50 12/13/2021 1035   CHOLHDL 2.4 12/13/2021 1035   VLDL 12 12/13/2021 1035   LDLCALC 58 12/13/2021 1035   LDLDIRECT 54 12/13/2021 1035    No components found for: "NTPROBNP" Recent Labs    07/15/22 1108 10/08/22 1038  PROBNP 287 302   No results for input(s): "TSH" in the last 8760 hours.   BMP Recent Labs    07/15/22 1112 10/08/22 1038 10/24/22 1632  NA 140 140 135  K 4.9 4.6 4.1  CL 103 103 104  CO2 21 22 24   GLUCOSE 249* 142* 201*  BUN 20 16 19   CREATININE 0.93 0.80 0.80  CALCIUM 10.0 10.0 9.4  GFRNONAA  --   --  >60    HEMOGLOBIN A1C Lab Results  Component Value Date   HGBA1C 8.7 (H) 12/13/2021   MPG 202.99 12/13/2021    IMPRESSION:    ICD-10-CM   1. Paroxysmal atrial fibrillation (HCC)  I48.0     2. S/P Maze operation for atrial fibrillation  Z98.890    Z86.79     3. Long term current use of antiarrhythmic drug  Z79.899     4. Long term (current) use of anticoagulants  Z79.01     5. Heart failure with improved ejection fraction (HFimpEF) (HCC)  I50.32     6.  S/P CABG x 4  Z95.1 ranolazine (RANEXA) 500 MG 12 hr tablet    7. Ischemic cardiomyopathy  I25.5 ranolazine (RANEXA) 500 MG 12 hr tablet    8. Nonrheumatic aortic valve stenosis  I35.0 ECHOCARDIOGRAM COMPLETE    9. Type 2 diabetes mellitus with microalbuminuria, without long-term current use of insulin (HCC)  E11.29    R80.9     10. Mixed hyperlipidemia  E78.2          RECOMMENDATIONS: Dary Johnsey is a 77 y.o. male whose past medical history and cardiac risk factors include: Ischemic cardiomyopathy, heart failure with improved EF, CAD s/p CABG / Clipping of atrial appendage using 45mm atrial clip, aortic stenosis,  paroxysmal atrial fibrillation status post DDCV (06/10/2021), hypertension, diabetes mellitus with microalbuminuria, sleep apnea, erectile dysfunction, heartburn, former smoker, hyperlipidemia, sleep apnea not on CPAP,, advanced age.  Paroxysmal atrial fibrillation (HCC) S/P Maze operation for atrial fibrillation Long term current use of antiarrhythmic drug Long term (current) use of anticoagulants EKG: Sinus rhythm. Rate control: Metoprolol. Rhythm control: Multaq. Thromboembolic prophylaxis: Eliquis. CHA2DS2-VASc SCORE is 5 which correlates to 6.7% risk of stroke per year.  Successfully underwent TEE guided cardioversion 05/2021. Does not endorse bleeding. Has been evaluated for sleep apnea-awaiting device therapy. Patient has stopped methotrexate -after discussing with PCP which he was taking for rheumatoid arthritis  Ischemic cardiomyopathy Atherosclerosis of native coronary artery of native heart without angina pectoris S/P CABG x 4 Heart failure  with improved EF  Denies anginal discomfort. Euvolemic. Stage C, NYHA class II. September 2022: LVEF 25-30%. June 2024: LVEF 45-50% Last MPI in July 2023 - no reversible defect.  No recent labs available for review.  He recently had labs with PCP and they will arrange a copy of labs to be sent to the office for  reference Medications reconciled.  Nonrheumatic aortic valve stenosis Asymptomatic. Denies anginal discomfort, heart failure exacerbation, or syncope. Will repeat echocardiogram in December 2024 to reevaluate LVEF and severity/progression of aortic stenosis Monitor for now  Type 2 diabetes mellitus with microalbuminuria, without long-term current use of insulin (HCC) Reemphasized importance of glycemic control. Currently on Entresto, Farxiga, statin therapy  Mixed hyperlipidemia Currently on rosuvastatin.   He denies myalgia or other side effects. Most recent lipids dated July 2023 reviewed as noted above.  Since last office visit he did undergo PAD evaluation with Dr. Ulice Brilliant at [facility] for nonhealing ulcer on his right foot second digit.  ABIs are within normal limits but findings suggestive of likely small vessel disease due to abnormal toe brachial index will defer further management to Dr. Ulice Brilliant.  FINAL MEDICATION LIST END OF ENCOUNTER: Meds ordered this encounter  Medications   ranolazine (RANEXA) 500 MG 12 hr tablet    Sig: Take 1 tablet (500 mg total) by mouth 2 (two) times daily.    Dispense:  180 tablet    Refill:  3   Medications Discontinued During This Encounter  Medication Reason   methotrexate (RHEUMATREX) 2.5 MG tablet Patient Preference   ranolazine (RANEXA) 500 MG 12 hr tablet Reorder     Current Outpatient Medications:    acetaminophen (TYLENOL) 500 MG tablet, Take 1,000 mg by mouth every 6 (six) hours as needed for moderate pain or mild pain., Disp: , Rfl:    aspirin EC 81 MG tablet, Take 81 mg by mouth daily., Disp: , Rfl:    ELIQUIS 5 MG TABS tablet, TAKE 1 TABLET (5 MG TOTAL) BY MOUTH 2 (TWO) TIMES DAILY., Disp: 180 tablet, Rfl: 10   ENTRESTO 97-103 MG, TAKE 1 TABLET TWICE DAILY, Disp: 180 tablet, Rfl: 10   famotidine (PEPCID) 40 MG tablet, Take 40 mg by mouth at bedtime., Disp: , Rfl:    FARXIGA 10 MG TABS tablet, TAKE 1 TABLET EVERY DAY, Disp: 90  tablet, Rfl: 3   folic acid (FOLVITE) 1 MG tablet, Take 2 mg by mouth daily., Disp: , Rfl:    glipiZIDE (GLUCOTROL XL) 10 MG 24 hr tablet, Take 10 mg by mouth 2 (two) times daily., Disp: , Rfl:    magnesium oxide (MAG-OX) 400 (240 Mg) MG tablet, TAKE 1 TABLET EVERY DAY, Disp: 90 tablet, Rfl: 10   metFORMIN (GLUCOPHAGE) 1000 MG tablet, Take 1,000 mg by mouth daily with breakfast., Disp: , Rfl:    metoprolol succinate (TOPROL-XL) 25 MG 24 hr tablet, TAKE 1 TABLET EVERY MORNING, Disp: 90 tablet, Rfl: 3   MULTAQ 400 MG tablet, TAKE 1 TABLET (400 MG TOTAL) BY MOUTH 2 (TWO) TIMES DAILY WITH A MEAL., Disp: 180 tablet, Rfl: 1   nitroGLYCERIN (NITROSTAT) 0.4 MG SL tablet, Place under the tongue., Disp: , Rfl:    pantoprazole (PROTONIX) 40 MG tablet, Take 40 mg by mouth every evening., Disp: , Rfl:    potassium chloride (KLOR-CON M) 10 MEQ tablet, TAKE 1 TABLET (10 MEQ TOTAL) BY MOUTH DAILY., Disp: 90 tablet, Rfl: 3   rosuvastatin (CRESTOR) 5 MG tablet, Take 5 mg by mouth at  bedtime., Disp: , Rfl:    amoxicillin-clavulanate (AUGMENTIN) 875-125 MG tablet, Take 1 tablet by mouth every 12 (twelve) hours. (Patient not taking: Reported on 01/20/2023), Disp: 14 tablet, Rfl: 0   ranolazine (RANEXA) 500 MG 12 hr tablet, Take 1 tablet (500 mg total) by mouth 2 (two) times daily., Disp: 180 tablet, Rfl: 3  Orders Placed This Encounter  Procedures   ECHOCARDIOGRAM COMPLETE    There are no Patient Instructions on file for this visit.   --Continue cardiac medications as reconciled in final medication list. --Return in about 4 months (around 05/13/2023) for Follow up, A. fib, CAD. Or sooner if needed. --Continue follow-up with your primary care physician regarding the management of your other chronic comorbid conditions.  Patient's questions and concerns were addressed to his satisfaction. He voices understanding of the instructions provided during this encounter.   This note was created using a voice recognition  software as a result there may be grammatical errors inadvertently enclosed that do not reflect the nature of this encounter. Every attempt is made to correct such errors.  Tessa Lerner, [place], [facility]  Pager:  [phone] Office: [phone]

## 2023-01-25 DIAGNOSIS — L97511 Non-pressure chronic ulcer of other part of right foot limited to breakdown of skin: Secondary | ICD-10-CM | POA: Diagnosis not present

## 2023-01-26 ENCOUNTER — Encounter: Payer: Self-pay | Admitting: Cardiology

## 2023-02-01 NOTE — Progress Notes (Signed)
Called patient no answer left a vm

## 2023-02-03 DIAGNOSIS — G4733 Obstructive sleep apnea (adult) (pediatric): Secondary | ICD-10-CM | POA: Diagnosis not present

## 2023-02-03 NOTE — Progress Notes (Signed)
Called patient no answer left a vm

## 2023-02-08 DIAGNOSIS — Z299 Encounter for prophylactic measures, unspecified: Secondary | ICD-10-CM | POA: Diagnosis not present

## 2023-02-08 DIAGNOSIS — E1165 Type 2 diabetes mellitus with hyperglycemia: Secondary | ICD-10-CM | POA: Diagnosis not present

## 2023-02-08 DIAGNOSIS — Z23 Encounter for immunization: Secondary | ICD-10-CM | POA: Diagnosis not present

## 2023-02-08 DIAGNOSIS — I10 Essential (primary) hypertension: Secondary | ICD-10-CM | POA: Diagnosis not present

## 2023-02-14 DIAGNOSIS — I10 Essential (primary) hypertension: Secondary | ICD-10-CM | POA: Diagnosis not present

## 2023-02-14 DIAGNOSIS — E1165 Type 2 diabetes mellitus with hyperglycemia: Secondary | ICD-10-CM | POA: Diagnosis not present

## 2023-02-15 ENCOUNTER — Ambulatory Visit: Payer: Self-pay | Admitting: Licensed Clinical Social Worker

## 2023-02-15 DIAGNOSIS — L97511 Non-pressure chronic ulcer of other part of right foot limited to breakdown of skin: Secondary | ICD-10-CM | POA: Diagnosis not present

## 2023-02-15 IMAGING — CR DG CHEST 2V
2 series · 2 of 2 positions shown · non-contrast
Comparison: 04/14/2021

CLINICAL DATA: Hx CABG [DATE], A fib, some sob, no smoke

EXAM:
CHEST - 2 VIEW

[w chest pa]
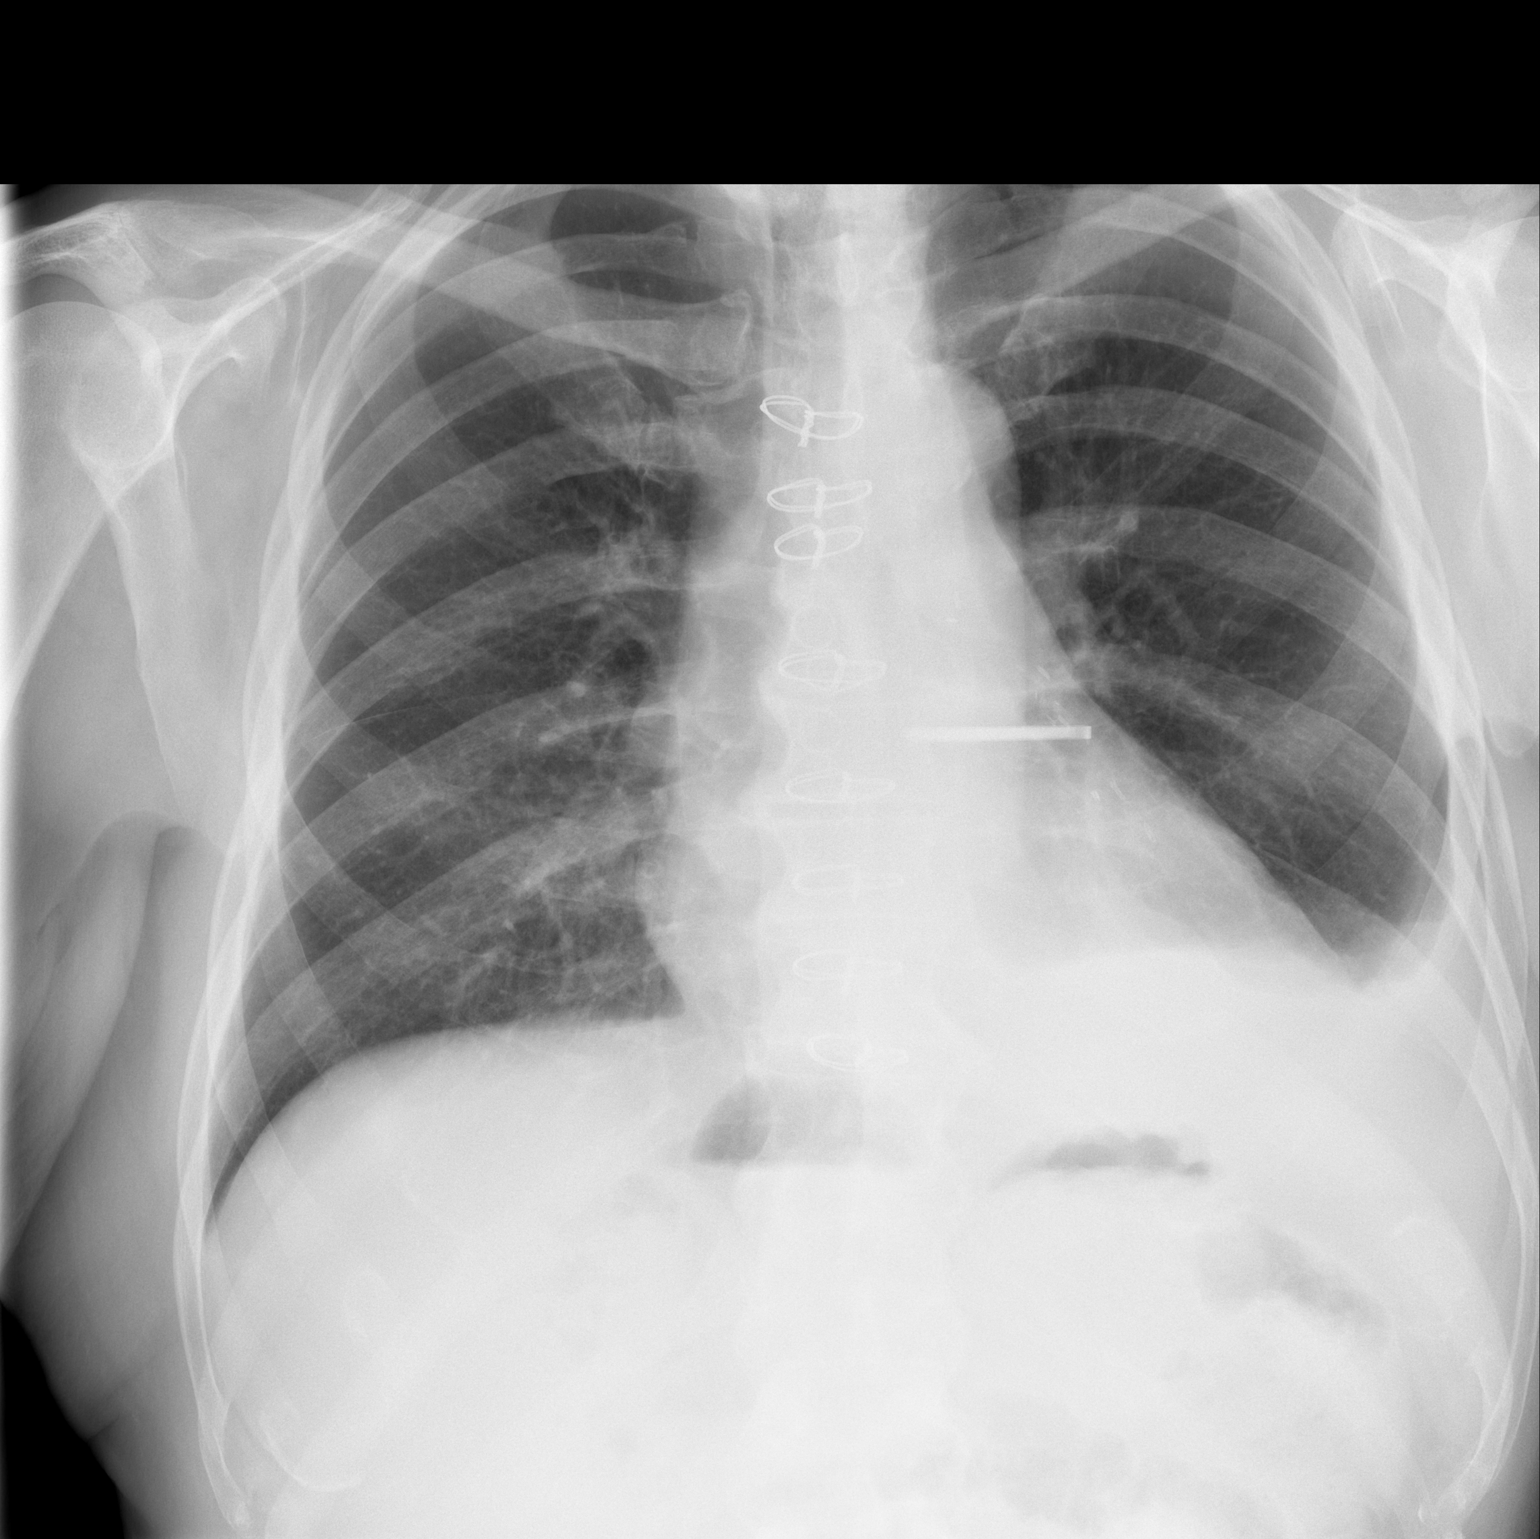

[w chest lat]
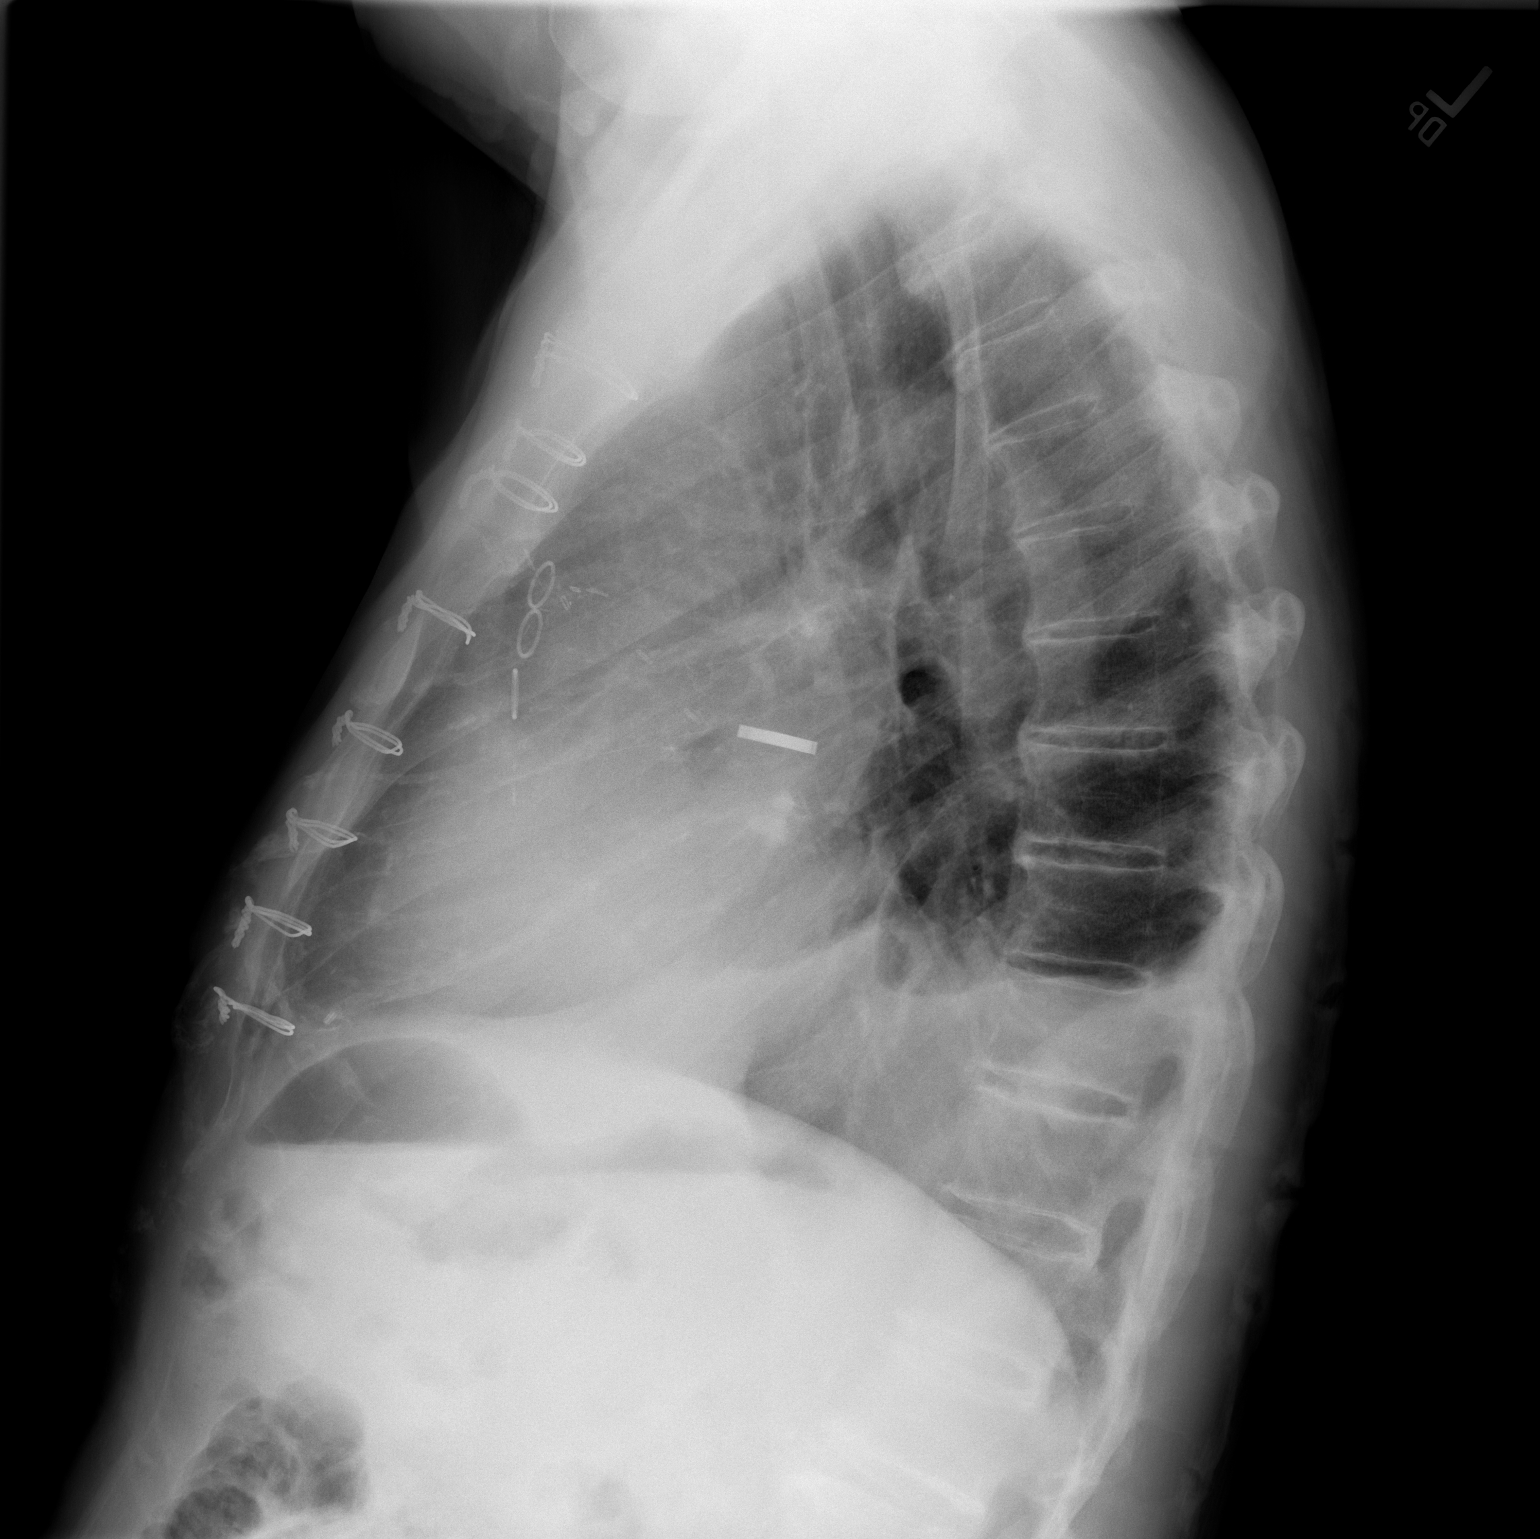

[2 of 2 positions shown; findings below may reference images not displayed]

FINDINGS: Small / moderate left pleural effusion persists. Adjacent
atelectasis at the left lung base. Right lung clear.

Heart size and mediastinal contours are within normal limits. Atrial
appendage clip. CABG markers. Aortic Atherosclerosis ([id number]).

No pneumothorax.

Sternotomy wires.
IMPRESSION: Persistent left pleural effusion.

## 2023-02-15 NOTE — Patient Outreach (Signed)
Care Coordination   Follow Up Visit Note   02/15/2023 Name: Derek Bond [facility] MRN: [id number] DOB: 1946-04-09  Lindwood Coke Nimmons is a 77 y.o. year old male who sees Vyas, Dhruv B, MD for primary care. I spoke with  Claus Ray Weyer by phone today.  What matters to the patients health and wellness today? Patient has stress related to managing medical  needs faced     Goals Addressed             This Visit's Progress    Patient has stress related to managing medical needs       Interventions:  Spoke with client via phone about his current status Discussed program support with client. Discussed RN, Pharmacist and LCSW  support Reviewed pain issues. Reviewed sleeping issues. Client said he is sleeping adequately Client is driving short trips as needed. Discussed family support. He said that his grandson and his wife reside with client and are supportive Discussed medication procurement.  Client said he takes 22 medications per day.  He is trying to manage costs of these medications Discussed walking of client. He uses a cane as needed to help him walk. He said he uses a cane to help him with balance when he walks Discussed support of PCP , Dr. Doreen Beam in [place], [place]   Client spoke of history of heart issues.  Provided counseling support for client Discussed arthritis challenges. Discussed pain issus associated with arthritis. He said he still functions adequately Discussed cardiologist support. Discussed client insurance. He has [employer]. Thanked client for phone call with LCSW today Encouraged client to call LCSW as needed at [phone].for social work support          SDOH assessments and interventions completed:  Yes  SDOH Interventions Today    Flowsheet Row Most Recent Value  SDOH Interventions   Depression Interventions/Treatment  Counseling  Physical Activity Interventions Other (Comments)  [uses a cane to help him walk]  Stress Interventions Other  (Comment)  [has stress in managing medical needs]        Care Coordination Interventions:  Yes, provided   Interventions Today    Flowsheet Row Most Recent Value  Chronic Disease   Chronic disease during today's visit Other  [spoke with client about client needs]  General Interventions   General Interventions Discussed/Reviewed General Interventions Discussed, Community Resources  Exercise Interventions   Exercise Discussed/Reviewed Physical Activity  [uses a cane to help him walk]  Physical Activity Discussed/Reviewed Physical Activity Discussed  Education Interventions   Education Provided Provided Education  Provided Verbal Education On Community Resources  Mental Health Interventions   Mental Health Discussed/Reviewed Anxiety, Coping Strategies  [client is in positive mood . No mood issues noted]  Nutrition Interventions   Nutrition Discussed/Reviewed Nutrition Discussed  Pharmacy Interventions   Pharmacy Dicussed/Reviewed Pharmacy Topics Discussed  Clarita Crane said he takes 22 medications per day]       Follow up plan: Follow up call scheduled for 04/12/23 at 10:30 AM    Encounter Outcome:  Patient Visit Completed   Kelton Pillar.Kambria Grima MSW, LCSW Licensed Visual merchandiser [facility] Care Management [phone]

## 2023-02-15 NOTE — Patient Instructions (Signed)
Visit Information  Thank you for taking time to visit with me today. Please don't hesitate to contact me if I can be of assistance to you.   Following are the goals we discussed today:   Goals Addressed             This Visit's Progress    Patient has stress related to managing medical needs       Interventions:  Spoke with client via phone about his current status Discussed program support with client. Discussed RN, Pharmacist and LCSW  support Reviewed pain issues. Reviewed sleeping issues. Client said he is sleeping adequately Client is driving short trips as needed. Discussed family support. He said that his grandson and his wife reside with client and are supportive Discussed medication procurement.  Client said he takes 22 medications per day.  He is trying to manage costs of these medications Discussed walking of client. He uses a cane as needed to help him walk. He said he uses a cane to help him with balance when he walks Discussed support of PCP , Dr. Doreen Beam in [place], [place]   Client spoke of history of heart issues.  Provided counseling support for client Discussed arthritis challenges. Discussed pain issus associated with arthritis. He said he still functions adequately Discussed cardiologist support. Discussed client insurance. He has [employer]. Thanked client for phone call with LCSW today Encouraged client to call LCSW as needed at [phone].for social work support          Our next appointment is by telephone on 04/12/23 at 10:30 AM   Please call the care guide team at [phone] if you need to cancel or reschedule your appointment.   If you are experiencing a Mental Health or Behavioral Health Crisis or need someone to talk to, please go to [facility] [address], [place] [phone])   The patient verbalized understanding of instructions, educational materials, and care plan provided today and  DECLINED offer to receive copy of patient instructions, educational materials, and care plan.   The patient has been provided with contact information for the care management team and has been advised to call with any health related questions or concerns.   Kelton Pillar.Gryffin Altice MSW, LCSW Licensed Visual merchandiser [facility] Care Management [phone]

## 2023-02-17 ENCOUNTER — Encounter (HOSPITAL_COMMUNITY): Payer: Self-pay

## 2023-02-17 ENCOUNTER — Emergency Department (HOSPITAL_COMMUNITY)
Admission: EM | Admit: 2023-02-17 | Discharge: 2023-02-17 | Disposition: A | Discharge status: Against Medical Advice | Payer: Medicare PPO | Attending: Emergency Medicine | Admitting: Emergency Medicine

## 2023-02-17 ENCOUNTER — Other Ambulatory Visit: Payer: Self-pay

## 2023-02-17 ENCOUNTER — Emergency Department (HOSPITAL_COMMUNITY): Payer: Medicare PPO

## 2023-02-17 DIAGNOSIS — R072 Precordial pain: Secondary | ICD-10-CM | POA: Diagnosis not present

## 2023-02-17 DIAGNOSIS — R42 Dizziness and giddiness: Secondary | ICD-10-CM | POA: Insufficient documentation

## 2023-02-17 DIAGNOSIS — R079 Chest pain, unspecified: Secondary | ICD-10-CM | POA: Diagnosis not present

## 2023-02-17 DIAGNOSIS — Z5321 Procedure and treatment not carried out due to patient leaving prior to being seen by health care provider: Secondary | ICD-10-CM | POA: Diagnosis not present

## 2023-02-17 DIAGNOSIS — K30 Functional dyspepsia: Secondary | ICD-10-CM | POA: Diagnosis not present

## 2023-02-17 LAB — CBC
HCT: 47.4 % (ref 39.0–52.0)
Hemoglobin: 15.6 g/dL (ref 13.0–17.0)
MCH: 30.8 pg (ref 26.0–34.0)
MCHC: 32.9 g/dL (ref 30.0–36.0)
MCV: 93.5 fL (ref 80.0–100.0)
Platelets: 212 10*3/uL (ref 150–400)
RBC: 5.07 MIL/uL (ref 4.22–5.81)
RDW: 14.7 % (ref 11.5–15.5)
WBC: 6.4 10*3/uL (ref 4.0–10.5)
nRBC: 0 % (ref 0.0–0.2)

## 2023-02-17 LAB — TROPONIN I (HIGH SENSITIVITY)
Troponin I (High Sensitivity): 4 ng/L (ref <18)
Troponin I (High Sensitivity): 4 ng/L (ref <18)

## 2023-02-17 LAB — BASIC METABOLIC PANEL
Anion gap: 11 (ref 5–15)
BUN: 19 mg/dL (ref 8–23)
CO2: 24 mmol/L (ref 22–32)
Calcium: 9.9 mg/dL (ref 8.9–10.3)
Chloride: 99 mmol/L (ref 98–111)
Creatinine, Ser: 0.96 mg/dL (ref 0.61–1.24)
GFR, Estimated: >60 mL/min (ref >60)
Glucose, Bld: 135 mg/dL — ABNORMAL HIGH (ref 70–99)
Potassium: 4.5 mmol/L (ref 3.5–5.1)
Sodium: 134 mmol/L — ABNORMAL LOW (ref 135–145)

## 2023-02-17 NOTE — ED Triage Notes (Addendum)
C/o substernal chest pain x 2 days after doing work around the house.  Pain worsen with movement. Denies sob. Denies weight gain/ extremity swelling.  Tylenol w/ improvement @ 10am  nitroglycerin w/o relief.

## 2023-02-21 ENCOUNTER — Other Ambulatory Visit: Payer: Self-pay | Admitting: Cardiology

## 2023-02-21 DIAGNOSIS — I5022 Chronic systolic (congestive) heart failure: Secondary | ICD-10-CM

## 2023-02-23 DIAGNOSIS — R0789 Other chest pain: Secondary | ICD-10-CM | POA: Diagnosis not present

## 2023-02-23 DIAGNOSIS — I5022 Chronic systolic (congestive) heart failure: Secondary | ICD-10-CM | POA: Diagnosis not present

## 2023-02-23 DIAGNOSIS — I10 Essential (primary) hypertension: Secondary | ICD-10-CM | POA: Diagnosis not present

## 2023-02-23 DIAGNOSIS — Z299 Encounter for prophylactic measures, unspecified: Secondary | ICD-10-CM | POA: Diagnosis not present

## 2023-03-02 ENCOUNTER — Telehealth: Payer: Self-pay | Admitting: Cardiology

## 2023-03-02 ENCOUNTER — Other Ambulatory Visit: Payer: Self-pay

## 2023-03-02 DIAGNOSIS — I5022 Chronic systolic (congestive) heart failure: Secondary | ICD-10-CM

## 2023-03-02 MED ORDER — METOPROLOL SUCCINATE ER 25 MG PO TB24
25.0000 mg | ORAL_TABLET | Freq: Every morning | ORAL | 3 refills | Status: DC
Start: 2023-03-02 — End: 2023-12-22

## 2023-03-02 NOTE — Telephone Encounter (Signed)
 *  STAT* If patient is at the pharmacy, call can be transferred to refill team.   1. Which medications need to be refilled? (please list name of each medication and dose if known) metoprolol succinate (TOPROL-XL) 25 MG 24 hr tablet    2. Would you like to learn more about the convenience, safety, & potential cost savings by using the [facility]?    3. Are you open to using the [facility] (Type [facility].   4. Which pharmacy/location (including street and city if local pharmacy) is medication to be sent to?  [facility] - [place], [place] - [address]     5. Do they need a 30 day or 90 day supply? 90 days

## 2023-03-05 DIAGNOSIS — G4733 Obstructive sleep apnea (adult) (pediatric): Secondary | ICD-10-CM | POA: Diagnosis not present

## 2023-03-16 DIAGNOSIS — I10 Essential (primary) hypertension: Secondary | ICD-10-CM | POA: Diagnosis not present

## 2023-03-16 DIAGNOSIS — E1165 Type 2 diabetes mellitus with hyperglycemia: Secondary | ICD-10-CM | POA: Diagnosis not present

## 2023-03-22 DIAGNOSIS — Z299 Encounter for prophylactic measures, unspecified: Secondary | ICD-10-CM | POA: Diagnosis not present

## 2023-03-22 DIAGNOSIS — I10 Essential (primary) hypertension: Secondary | ICD-10-CM | POA: Diagnosis not present

## 2023-03-22 DIAGNOSIS — J069 Acute upper respiratory infection, unspecified: Secondary | ICD-10-CM | POA: Diagnosis not present

## 2023-03-22 DIAGNOSIS — E1165 Type 2 diabetes mellitus with hyperglycemia: Secondary | ICD-10-CM | POA: Diagnosis not present

## 2023-03-30 ENCOUNTER — Other Ambulatory Visit: Payer: Self-pay | Admitting: Cardiology

## 2023-03-30 DIAGNOSIS — I480 Paroxysmal atrial fibrillation: Secondary | ICD-10-CM

## 2023-03-30 DIAGNOSIS — Z7901 Long term (current) use of anticoagulants: Secondary | ICD-10-CM

## 2023-03-30 NOTE — Telephone Encounter (Signed)
Eliquis 5mg  refill request received. Patient is 77 years old, weight-86.6kg, Crea-0.96 on 02/17/23, Diagnosis-Afib, and last seen by Dr. Odis Hollingshead on 01/20/23. Dose is appropriate based on dosing criteria. Will send in refill to requested pharmacy.

## 2023-04-01 DIAGNOSIS — Z299 Encounter for prophylactic measures, unspecified: Secondary | ICD-10-CM | POA: Diagnosis not present

## 2023-04-01 DIAGNOSIS — E1165 Type 2 diabetes mellitus with hyperglycemia: Secondary | ICD-10-CM | POA: Diagnosis not present

## 2023-04-01 DIAGNOSIS — J069 Acute upper respiratory infection, unspecified: Secondary | ICD-10-CM | POA: Diagnosis not present

## 2023-04-05 ENCOUNTER — Other Ambulatory Visit: Payer: Self-pay | Admitting: Cardiology

## 2023-04-05 DIAGNOSIS — I5022 Chronic systolic (congestive) heart failure: Secondary | ICD-10-CM

## 2023-04-05 DIAGNOSIS — G4733 Obstructive sleep apnea (adult) (pediatric): Secondary | ICD-10-CM | POA: Diagnosis not present

## 2023-04-12 ENCOUNTER — Ambulatory Visit: Payer: Self-pay | Admitting: Licensed Clinical Social Worker

## 2023-04-12 NOTE — Patient Instructions (Signed)
Visit Information  Thank you for taking time to visit with me today. Please don't hesitate to contact me if I can be of assistance to you.   Following are the goals we discussed today:   Goals Addressed             This Visit's Progress    Patient has stress related to managing medical needs       Interventions:  Spoke with client via phone  today about his current needs and status Client said he has appointment today with PCP Client has family support. His grandson and grandson's wife reside with client and provide some support for client. Client also has support from his daughter and son in law Discussed program support with client. Discussed RN, Pharmacist and LCSW  support Reviewed pain issues. Reviewed sleeping issues. Client said he is sleeping adequately Client is driving short trips as needed. Discussed medication procurement.   Discussed walking of client. He uses a cane as needed to help him walk. He said he uses a cane to help him with balance when he walks. He is dizzy occasionally Discussed support of PCP , Dr. Doreen Beam in [place], [place]   Client spoke of history of heart issues.  He said he sees cardiologist as scheduled for support Provided counseling support for client Discussed appetite of client. He said he goes to [employer] regularly for some meals Thanked client for phone call with LCSW today Encouraged client to call LCSW as needed at [phone].for social work support          Our next appointment is by telephone on 05/31/23 at 11:00 AM   Please call the care guide team at [phone] if you need to cancel or reschedule your appointment.   If you are experiencing a Mental Health or Behavioral Health Crisis or need someone to talk to, please go to [facility] [address], [place] [phone])   The patient verbalized understanding of instructions, educational materials, and care plan provided today and  DECLINED offer to receive copy of patient instructions, educational materials, and care plan.   The patient has been provided with contact information for the care management team and has been advised to call with any health related questions or concerns.   Kelton Pillar.Shariq Puig MSW, LCSW Licensed Visual merchandiser [facility] Care Management [phone]

## 2023-04-12 NOTE — Patient Outreach (Signed)
  Care Coordination   Follow Up Visit Note   04/12/2023 Name: Derek Bond DOB: [birth year]  Derek Bond is a 77 y.o. year old male who sees Bond, Derek B, MD for primary care. I spoke with  Derek Bond by phone today.  What matters to the patients health and wellness today? Patient has stress related to managing medical Needs     Goals Addressed             This Visit's Progress    Patient has stress related to managing medical needs       Interventions:  Spoke with client via phone  today about his current needs and status Client said he has appointment today with PCP Client has family support. His grandson and grandson's wife reside with client and provide some support for client. Client also has support from his daughter and son in law Discussed program support with client. Discussed RN, Pharmacist and LCSW  support Reviewed pain issues. Reviewed sleeping issues. Client said he is sleeping adequately Client is driving short trips as needed. Discussed medication procurement.   Discussed walking of client. He uses a cane as needed to help him walk. He said he uses a cane to help him with balance when he walks. He is dizzy occasionally Discussed support of PCP , Derek Bond in [place], [place]   Client spoke of history of heart issues.  He said he sees cardiologist as scheduled for support Provided counseling support for client Discussed appetite of client. He said he goes to Derek Bond regularly for some meals Thanked client for phone call with LCSW today Encouraged client to call LCSW as needed at [phone].for social work support          SDOH assessments and interventions completed:  Yes  SDOH Interventions Today    Flowsheet Row Most Recent Value  SDOH Interventions   Depression Interventions/Treatment  Counseling  Physical Activity Interventions Other (Comments)  [dizzy occasionally. He uses a cane to help him walk]  Stress  Interventions Provide Counseling  [client needs support with some ADLs activities]        Care Coordination Interventions:  Yes, provided  Interventions Today    Flowsheet Row Most Recent Value  Chronic Disease   Chronic disease during today's visit Other  [spoke with client about client needs]  General Interventions   General Interventions Discussed/Reviewed General Interventions Discussed, Community Resources  Education Interventions   Education Provided Provided Education  Provided Engineer, [employer]  Mental Health Interventions   Mental Health Discussed/Reviewed Coping Strategies  [no mood issues noted]  Nutrition Interventions   Nutrition Discussed/Reviewed Nutrition Discussed  Pharmacy Interventions   Pharmacy Dicussed/Reviewed Pharmacy Topics Discussed  Safety Interventions   Safety Discussed/Reviewed Fall Risk        Follow up plan: Follow up call scheduled for 05/31/23 at 11:00 AM    Encounter Outcome:  Patient Visit Completed   Kelton Pillar.Caren Garske MSW, LCSW Licensed Visual merchandiser [facility] Care Management [phone]

## 2023-04-15 DIAGNOSIS — I10 Essential (primary) hypertension: Secondary | ICD-10-CM | POA: Diagnosis not present

## 2023-04-15 DIAGNOSIS — E1165 Type 2 diabetes mellitus with hyperglycemia: Secondary | ICD-10-CM | POA: Diagnosis not present

## 2023-04-19 DIAGNOSIS — E1142 Type 2 diabetes mellitus with diabetic polyneuropathy: Secondary | ICD-10-CM | POA: Diagnosis not present

## 2023-04-19 DIAGNOSIS — B351 Tinea unguium: Secondary | ICD-10-CM | POA: Diagnosis not present

## 2023-04-19 DIAGNOSIS — L84 Corns and callosities: Secondary | ICD-10-CM | POA: Diagnosis not present

## 2023-04-19 DIAGNOSIS — M79676 Pain in unspecified toe(s): Secondary | ICD-10-CM | POA: Diagnosis not present

## 2023-04-21 ENCOUNTER — Ambulatory Visit (HOSPITAL_COMMUNITY): Payer: Medicare PPO | Attending: Cardiology

## 2023-04-21 DIAGNOSIS — I350 Nonrheumatic aortic (valve) stenosis: Secondary | ICD-10-CM | POA: Diagnosis not present

## 2023-04-21 LAB — ECHOCARDIOGRAM COMPLETE
AR max vel: 1.33 cm2
AV Area VTI: 1.39 cm2
AV Area mean vel: 1.39 cm2
AV Mean grad: 16 mm[Hg]
AV Peak grad: 29.9 mm[Hg]
Ao pk vel: 2.73 m/s
Area-P 1/2: 3.42 cm2
S' Lateral: 2.8 cm

## 2023-04-23 ENCOUNTER — Other Ambulatory Visit: Payer: Self-pay | Admitting: Cardiology

## 2023-04-23 DIAGNOSIS — I5022 Chronic systolic (congestive) heart failure: Secondary | ICD-10-CM

## 2023-04-25 ENCOUNTER — Other Ambulatory Visit: Payer: Self-pay | Admitting: Cardiology

## 2023-04-26 ENCOUNTER — Other Ambulatory Visit: Payer: Self-pay

## 2023-04-26 DIAGNOSIS — I77810 Thoracic aortic ectasia: Secondary | ICD-10-CM

## 2023-05-05 DIAGNOSIS — G4733 Obstructive sleep apnea (adult) (pediatric): Secondary | ICD-10-CM | POA: Diagnosis not present

## 2023-05-16 DIAGNOSIS — I10 Essential (primary) hypertension: Secondary | ICD-10-CM | POA: Diagnosis not present

## 2023-05-16 DIAGNOSIS — E1165 Type 2 diabetes mellitus with hyperglycemia: Secondary | ICD-10-CM | POA: Diagnosis not present

## 2023-05-19 ENCOUNTER — Ambulatory Visit (HOSPITAL_COMMUNITY)
Admission: RE | Admit: 2023-05-19 | Discharge: 2023-05-19 | Disposition: A | Discharge status: Home / Self Care | Payer: Medicare PPO | Source: Ambulatory Visit | Attending: Cardiology | Admitting: Cardiology

## 2023-05-19 DIAGNOSIS — I7121 Aneurysm of the ascending aorta, without rupture: Secondary | ICD-10-CM | POA: Diagnosis not present

## 2023-05-19 DIAGNOSIS — I77810 Thoracic aortic ectasia: Secondary | ICD-10-CM | POA: Insufficient documentation

## 2023-05-19 DIAGNOSIS — I700 Atherosclerosis of aorta: Secondary | ICD-10-CM | POA: Diagnosis not present

## 2023-05-24 ENCOUNTER — Ambulatory Visit: Payer: Self-pay | Admitting: Cardiology

## 2023-05-30 ENCOUNTER — Ambulatory Visit: Payer: Medicare PPO | Attending: Cardiology | Admitting: Cardiology

## 2023-05-30 ENCOUNTER — Encounter: Payer: Self-pay | Admitting: Cardiology

## 2023-05-30 VITALS — BP 100/66 | HR 66 | Resp 16 | Ht 78.0 in | Wt 191.8 lb

## 2023-05-30 DIAGNOSIS — I77810 Thoracic aortic ectasia: Secondary | ICD-10-CM | POA: Diagnosis not present

## 2023-05-30 DIAGNOSIS — Z87891 Personal history of nicotine dependence: Secondary | ICD-10-CM

## 2023-05-30 DIAGNOSIS — I350 Nonrheumatic aortic (valve) stenosis: Secondary | ICD-10-CM | POA: Diagnosis not present

## 2023-05-30 DIAGNOSIS — R809 Proteinuria, unspecified: Secondary | ICD-10-CM

## 2023-05-30 DIAGNOSIS — I480 Paroxysmal atrial fibrillation: Secondary | ICD-10-CM

## 2023-05-30 DIAGNOSIS — Z9889 Other specified postprocedural states: Secondary | ICD-10-CM

## 2023-05-30 DIAGNOSIS — Z8679 Personal history of other diseases of the circulatory system: Secondary | ICD-10-CM

## 2023-05-30 DIAGNOSIS — Z951 Presence of aortocoronary bypass graft: Secondary | ICD-10-CM

## 2023-05-30 DIAGNOSIS — Z79899 Other long term (current) drug therapy: Secondary | ICD-10-CM | POA: Diagnosis not present

## 2023-05-30 DIAGNOSIS — Z7901 Long term (current) use of anticoagulants: Secondary | ICD-10-CM | POA: Diagnosis not present

## 2023-05-30 DIAGNOSIS — E1129 Type 2 diabetes mellitus with other diabetic kidney complication: Secondary | ICD-10-CM

## 2023-05-30 DIAGNOSIS — I2510 Atherosclerotic heart disease of native coronary artery without angina pectoris: Secondary | ICD-10-CM | POA: Diagnosis not present

## 2023-05-30 DIAGNOSIS — I10 Essential (primary) hypertension: Secondary | ICD-10-CM

## 2023-05-30 DIAGNOSIS — I447 Left bundle-branch block, unspecified: Secondary | ICD-10-CM

## 2023-05-30 DIAGNOSIS — I5032 Chronic diastolic (congestive) heart failure: Secondary | ICD-10-CM | POA: Diagnosis not present

## 2023-05-30 MED ORDER — RANOLAZINE ER 500 MG PO TB12
500.0000 mg | ORAL_TABLET | Freq: Every day | ORAL | Status: DC
Start: 2023-05-30 — End: 2023-12-22

## 2023-05-30 NOTE — Progress Notes (Signed)
 Cardiology Office Note:  .   Date:  05/30/2023  ID:  Derek Bond, DOB Jun 06, 1945, MRN [id number] PCP:  Rosamond Leta NOVAK, MD  Former Cardiology Providers: NA [facility] Providers Cardiologist:  Madonna Large, DO , [facility] (established care 01/2021) Electrophysiologist:  None  Click to update primary MD,subspecialty MD or APP then REFRESH:1}    Chief Complaint  Patient presents with   Follow-up    CAD, Afib, review echo and CT chest     History of Present Illness: .   Derek Bond is a 78 y.o. Caucasian male whose past medical history and cardiovascular risk factors includes: Hx of ischemic cardiomyopathy (recovered as of Dec 2024), heart failure with improved EF, CAD s/p CABG / Clipping of atrial appendage using 45mm atrial clip, aortic stenosis,  paroxysmal atrial fibrillation status post DDCV (06/10/2021), hypertension, diabetes mellitus with microalbuminuria, sleep apnea, erectile dysfunction, heartburn, former smoker, hyperlipidemia, sleep apnea not on CPAP,, advanced age.   Coronary disease /heart failure with improved EF: Underwent four-vessel bypass surgery, with up titration of antianginal therapy and GDMT patient remains free of angina pectoris or heart failure symptoms.  Echocardiogram December 2024 noted normal LVEF, details report reviewed with patient and daughter at today's office visit  Paroxysmal atrial fibrillation: In 2022, at the time of bypass surgery patient underwent left atrial Maze procedure, 45 mm atrial clip placement as well.  Currently on any beta-blocking agents, antiarrhythmic medications, and anticoagulation for thromboembolic prophylaxis.  He does not endorse evidence of bleeding.  His last TEE cardioversion was in January 2023.  Aortic stenosis: Denies anginal chest pain, heart failure symptoms, near-syncope or syncopal event.  Most recent echocardiogram notes the severity of aortic stenosis to be mild/moderate.  Conservative management for now.   Repeat echocardiogram in 1 year  Review of Systems: .   Review of Systems  Cardiovascular:  Negative for chest pain, claudication, irregular heartbeat, leg swelling, near-syncope, orthopnea, palpitations, paroxysmal nocturnal dyspnea and syncope.  Respiratory:  Negative for shortness of breath.   Hematologic/Lymphatic: Negative for bleeding problem.    Studies Reviewed:   02/26/2021: CABG x4 (LIMA to LAD, SVG to DIAGONAL, SVG to OM, SVG to PLB) + MAZE + 45 mm atrial clip placement.   06/10/2021: TEE guided cardioversion: SR restored with synchronized biphasic 200 J x 2  EKG: October 19, 2022: Sinus rhythm, 71 bpm, left bundle branch block, left axis deviation   Echocardiogram: 01/2021: LVEF 25-30%, see report for additional details October 2022: LVEF 30-35%, see report for additional details July 2023: LVEF 40-45%, see report Between 2024: LVEF 45-50%, see report  04/21/2023 1. Left ventricular ejection fraction, by estimation, is 55 to 60%. The left ventricle has normal function. The left ventricle has no regional wall motion abnormalities. There is moderate asymmetric left ventricular hypertrophy of the basal-septal  segment. Indeterminate diastolic filling due to E-A fusion. 2. Right ventricular systolic function is normal. The right ventricular size is normal. 3. Left atrial size was mildly dilated. 4. The mitral valve is degenerative. Trivial mitral valve regurgitation. 5. Tricuspid valve regurgitation is mild to moderate. 6. The aortic valve is tricuspid. There is severe calcifcation of the aortic valve. Aortic valve regurgitation is not visualized. Mild to moderate aortic valve stenosis. 7. Aneurysm of the ascending aorta, measuring 44 mm. 8. IVC is not well visualized.  Plan RADIOLOGY: CT Chest w/o contrast:  05/18/2021 1. 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 Recommend follow-up CT/MR every 12 months and vascular consultation. Recommend annual  imaging followup by CTA or  MRA. This recommendation follows 2010 ACCF/AHA/AATS/ACR/ASA/SCA/SCAI/SIR/STS/SVM Guidelines for the Diagnosis and Management of Patients with Thoracic Aortic Disease. Circulation. 2010; 121: Z733-z630. Aortic aneurysm NOS (ICD10-I71.9) 2. Borderline dilated central pulmonary arteries, suggesting borderline pulmonary arterial hypertension. 3. Left atrial and left ventricular enlargement. 4. 1.5 cm exophytic mid left renal mass with a mean Hounsfield unit density of 18. This is probably a hemorrhagic or proteinaceous cyst, but a solid renal mass is not excluded. A renal ultrasound is recommended for further evaluation. 5. Cholelithiasis. 6. Calcific coronary artery and aortic atherosclerosis.  Aortic Atherosclerosis (ICD10-I70.0).   Risk Assessment/Calculations:   Click Here to Calculate/Change CHADS2VASc Score The patient's CHADS2-VASc score is 5, indicating a 7.2% annual risk of stroke.  Heart  CHF History: Yes HTN History: Yes Diabetes History: No Stroke History: No Vascular Disease History: Yes  Labs:       Latest Ref Rng & Units 02/17/2023    2:40 PM 10/24/2022    4:32 PM 10/08/2022   10:38 AM  CBC  WBC 4.0 - 10.5 K/uL 6.4  5.4    Hemoglobin 13.0 - 17.0 g/dL 84.3  85.9  85.4   Hematocrit 39.0 - 52.0 % 47.4  42.7  44.4   Platelets 150 - 400 K/uL 212  235         Latest Ref Rng & Units 02/17/2023    2:40 PM 10/24/2022    4:32 PM 10/08/2022   10:38 AM  BMP  Glucose 70 - 99 mg/dL 864  798  857   BUN 8 - 23 mg/dL 19  19  16    Creatinine 0.61 - 1.24 mg/dL 9.03  9.19  9.19   BUN/Creat Ratio 10 - 24   20   Sodium 135 - 145 mmol/L 134  135  140   Potassium 3.5 - 5.1 mmol/L 4.5  4.1  4.6   Chloride 98 - 111 mmol/L 99  104  103   CO2 22 - 32 mmol/L 24  24  22    Calcium  8.9 - 10.3 mg/dL 9.9  9.4  89.9       Latest Ref Rng & Units 02/17/2023    2:40 PM 10/24/2022    4:32 PM 10/08/2022   10:38 AM  CMP  Glucose 70 - 99 mg/dL 864  798  857   BUN 8 - 23 mg/dL 19  19  16    Creatinine  0.61 - 1.24 mg/dL 9.03  9.19  9.19   Sodium 135 - 145 mmol/L 134  135  140   Potassium 3.5 - 5.1 mmol/L 4.5  4.1  4.6   Chloride 98 - 111 mmol/L 99  104  103   CO2 22 - 32 mmol/L 24  24  22    Calcium  8.9 - 10.3 mg/dL 9.9  9.4  89.9     Lab Results  Component Value Date   CHOL 120 12/13/2021   HDL 50 12/13/2021   LDLCALC 58 12/13/2021   LDLDIRECT 54 12/13/2021   TRIG 59 12/13/2021   CHOLHDL 2.4 12/13/2021   No results for input(s): LIPOA in the last 8760 hours. No components found for: NTPROBNP Recent Labs    07/15/22 1108 10/08/22 1038  PROBNP 287 302   No results for input(s): TSH in the last 8760 hours.  External Labs: Collected: August 2024 [facility] database. Total cholesterol 110, triglycerides 57, HDL 52, LDL calculated 45. TSH 1.72  Collected: October 2024 [facility] database. BUN 19, creatinine 0.96.  Physical Exam:    Today's Vitals   05/30/23 0816  BP: 100/66  Pulse: 66  Resp: 16  SpO2: 95%  Weight: 191 lb 12.8 oz (87 kg)  Height: 6' 6 (1.981 m)   Body mass index is 22.16 kg/m. Wt Readings from Last 3 Encounters:  05/30/23 191 lb 12.8 oz (87 kg)  02/17/23 191 lb (86.6 kg)  01/20/23 193 lb (87.5 kg)    Physical Exam  Constitutional: No distress.  Age appropriate, hemodynamically stable.   Neck: No JVD present.  Cardiovascular: Normal rate, regular rhythm, S1 normal, S2 normal and intact distal pulses. Exam reveals no gallop, no S3 and no S4.  Murmur heard. Crescendo-decrescendo systolic murmur is present with a grade of 3/6. Pulses:      Dorsalis pedis pulses are 2+ on the right side and 2+ on the left side.       Posterior tibial pulses are 1+ on the right side and 1+ on the left side.  Circular discoloration, black, right second digit (see media section).   Pulmonary/Chest: Effort normal and breath sounds normal. No stridor. He has no wheezes. He has no rales.  sternotomy site well-healed.  Abdominal: Soft. Bowel sounds are normal. He exhibits  no distension. There is no abdominal tenderness.  Musculoskeletal:        General: No edema.     Cervical back: Neck supple.  Neurological: He is alert and oriented to person, place, and time. He has intact cranial nerves (2-12).  Skin: Skin is warm and moist.     Impression & Recommendation(s):  Impression:   ICD-10-CM   1. Atherosclerosis of native coronary artery of native heart without angina pectoris  I25.10 ranolazine  (RANEXA ) 500 MG 12 hr tablet    2. S/P CABG x 4  Z95.1 ranolazine  (RANEXA ) 500 MG 12 hr tablet    3. Heart failure with improved ejection fraction (HFimpEF) (HCC)  I50.32     4. Chronic heart failure with preserved ejection fraction (HFpEF) (HCC)  I50.32     5. Paroxysmal atrial fibrillation (HCC)  I48.0     6. S/P Maze operation for atrial fibrillation  Z98.890    Z86.79     7. Long term current use of antiarrhythmic drug  Z79.899     8. Long term (current) use of anticoagulants  Z79.01     9. Nonrheumatic aortic valve stenosis  I35.0 ECHOCARDIOGRAM COMPLETE    10. Ascending aorta dilatation (HCC)  I77.810 CT Chest Wo Contrast    11. Benign hypertension  I10     12. Type 2 diabetes mellitus with microalbuminuria, without long-term current use of insulin  (HCC)  E11.29    R80.9     13. LBBB (left bundle branch block)  I44.7     14. Former smoker  Z87.891        Recommendation(s):  Atherosclerosis of native coronary artery of native heart without angina pectoris S/P CABG x 4 Heart failure with improved ejection fraction (HFimpEF) (HCC) Chronic HFpEF (heart failure with reduced ejection fraction) (HCC) Denies anginal chest pain or heart failure symptoms. Remains euvolemic. Stage C, NYHA class II: September 2022: LVEF 25-30%, see report December 2024 LVEF 55 to 60%, see report Continue aspirin  81 mg p.o. daily. Continue Farxiga  10 mg p.o. daily. Continue Toprol -XL 25 mg p.o. every morning. Continue Entresto  97/103 mg p.o. twice daily. Will  reduce Ranexa  from 500 mg twice daily to 500 mg p.o. daily and if able to would like to discontinue the  medication as he is on Multaq  for A-fib management  Paroxysmal atrial fibrillation (HCC) S/P Maze operation for atrial fibrillation Rate control: Toprol -XL. Rhythm control: Multaq . Thromboembolic prophylaxis: Eliquis  Successfully underwent TEE guided cardioversion in January 2023. Does not endorse evidence of bleeding. Has been evaluated for sleep apnea-avoiding device therapy.  Long term (current) use of anticoagulants Currently on Eliquis . Does not endorse evidence of bleeding. Risks, benefits, and alternatives to anticoagulation discussed.  Nonrheumatic aortic valve stenosis Currently asymptomatic. Echocardiogram December 2024 notes mild to moderate aortic stenosis. Will repeat echocardiogram in 1 year to reevaluate disease progression. Patient is asked to seek medical attention if he has new onset or worsening of heart failure, anginal chest pain, or syncopal events.  Ascending aorta dilatation (HCC) CT chest without contrast January 2025 43 mm ascending thoracic aortic aneurysm Will schedule a CT of the chest in 1 year to reevaluate disease progression No prior history of aortic syndromes, he is aware of symptoms to be concerned of and if present he needs to go to the ER via EMS.   Orders Placed:  Orders Placed This Encounter  Procedures   CT Chest Wo Contrast    Standing Status:   Future    Expected Date:   04/16/2024    Expiration Date:   05/29/2024    Preferred imaging location?:   [facility]   ECHOCARDIOGRAM COMPLETE    Standing Status:   Future    Expected Date:   05/29/2024    Expiration Date:   05/29/2024    Where should this test be performed:   [facility])    Does the patient weigh less than or greater than 250 lbs?:   Patient weighs less than 250 lbs    Perflutren DEFINITY (image enhancing agent) should be administered unless  hypersensitivity or allergy exist:   Administer Perflutren    Reason for exam-Echo:   Other-Full Diagnosis List    Full ICD-10/Reason for Exam:   Aortic stenosis [809133]   Final Medication List:    Meds ordered this encounter  Medications   ranolazine  (RANEXA ) 500 MG 12 hr tablet    Sig: Take 1 tablet (500 mg total) by mouth daily.    Medications Discontinued During This Encounter  Medication Reason   ranolazine  (RANEXA ) 500 MG 12 hr tablet      Current Outpatient Medications:    acetaminophen  (TYLENOL ) 500 MG tablet, Take 1,000 mg by mouth every 6 (six) hours as needed for moderate pain or mild pain., Disp: , Rfl:    amoxicillin -clavulanate (AUGMENTIN ) 875-125 MG tablet, Take 1 tablet by mouth every 12 (twelve) hours., Disp: 14 tablet, Rfl: 0   apixaban  (ELIQUIS ) 5 MG TABS tablet, TAKE 1 TABLET TWICE DAILY, Disp: 180 tablet, Rfl: 1   aspirin  EC 81 MG tablet, Take 81 mg by mouth daily., Disp: , Rfl:    dronedarone  (MULTAQ ) 400 MG tablet, TAKE 1 TABLET TWICE DAILY WITH MEALS, Disp: 180 tablet, Rfl: 2   famotidine  (PEPCID ) 40 MG tablet, Take 40 mg by mouth at bedtime., Disp: , Rfl:    FARXIGA  10 MG TABS tablet, TAKE 1 TABLET EVERY DAY, Disp: 90 tablet, Rfl: 3   glipiZIDE (GLUCOTROL XL) 10 MG 24 hr tablet, Take 10 mg by mouth 2 (two) times daily., Disp: , Rfl:    magnesium  oxide (MAG-OX) 400 (240 Mg) MG tablet, TAKE 1 TABLET EVERY DAY, Disp: 90 tablet, Rfl: 2   metFORMIN  (GLUCOPHAGE ) 1000 MG tablet, Take 1,000 mg by  mouth daily with breakfast., Disp: , Rfl:    metoprolol  succinate (TOPROL -XL) 25 MG 24 hr tablet, Take 1 tablet (25 mg total) by mouth every morning., Disp: 90 tablet, Rfl: 3   nitroGLYCERIN  (NITROSTAT ) 0.4 MG SL tablet, Place under the tongue., Disp: , Rfl:    pantoprazole  (PROTONIX ) 40 MG tablet, Take 40 mg by mouth every evening., Disp: , Rfl:    potassium chloride  (KLOR-CON  M) 10 MEQ tablet, TAKE 1 TABLET EVERY DAY, Disp: 90 tablet, Rfl: 2   rosuvastatin  (CRESTOR ) 5 MG  tablet, Take 5 mg by mouth at bedtime., Disp: , Rfl:    sacubitril -valsartan  (ENTRESTO ) 97-103 MG, TAKE 1 TABLET TWICE DAILY, Disp: 180 tablet, Rfl: 0   folic acid (FOLVITE) 1 MG tablet, Take 2 mg by mouth daily., Disp: , Rfl:    ranolazine  (RANEXA ) 500 MG 12 hr tablet, Take 1 tablet (500 mg total) by mouth daily., Disp: , Rfl:   Consent:   NA  Disposition:   6 months follow-up sooner if needed Patient may be asked to follow-up sooner based on the results of the above-mentioned testing.  His questions and concerns were addressed to his satisfaction. He voices understanding of the recommendations provided during this encounter.    Signed, Madonna Large, DO, [facility]  [facility]  [address] [place], [place] [address] 05/30/2023 10:56 AM

## 2023-05-30 NOTE — Patient Instructions (Signed)
 Medication Instructions:   DECREASE Ranexa  to 500 mg once daily  *If you need a refill on your cardiac medications before your next appointment, please call your pharmacy*  Lab Work: None ordered today. If you have labs (blood work) drawn today and your tests are completely normal, you will receive your results only by: MyChart Message (if you have MyChart) OR A paper copy in the mail If you have any lab test that is abnormal or we need to change your treatment, we will call you to review the results.  Testing/Procedures: Your physician has requested that you have an echocardiogram in 1 year (December 2025). Echocardiography is a painless test that uses sound waves to create images of your heart. It provides your doctor with information about the size and shape of your heart and how well your heart's chambers and valves are working. This procedure takes approximately one hour. There are no restrictions for this procedure. Please do NOT wear cologne, perfume, aftershave, or lotions (deodorant is allowed). Please arrive 15 minutes prior to your appointment time.  Please note: We ask at that you not bring children with you during ultrasound (echo/ vascular) testing. Due to room size and safety concerns, children are not allowed in the ultrasound rooms during exams. Our front office staff cannot provide observation of children in our lobby area while testing is being conducted. An adult accompanying a patient to their appointment will only be allowed in the ultrasound room at the discretion of the ultrasound technician under special circumstances. We apologize for any inconvenience.  Your physician has requested a Chest CT without contrast in 1 year (December 2025).  Follow-Up: At [facility], you and your health needs are our priority.  As part of our continuing mission to provide you with exceptional heart care, we have created designated Provider Care Teams.  These Care Teams include your  primary Cardiologist (physician) and Advanced Practice Providers (APPs -  Physician Assistants and Nurse Practitioners) who all work together to provide you with the care you need, when you need it.  We recommend signing up for the patient portal called MyChart.  Sign up information is provided on this After Visit Summary.  MyChart is used to connect with patients for Virtual Visits (Telemedicine).  Patients are able to view lab/test results, encounter notes, upcoming appointments, etc.  Non-urgent messages can be sent to your provider as well.   To learn more about what you can do with MyChart, go to [email or web address].    Your next appointment:   6 month(s)  The format for your next appointment:   In Person  Provider:   Madonna Large, DO {

## 2023-05-31 ENCOUNTER — Ambulatory Visit: Payer: Self-pay | Admitting: Licensed Clinical Social Worker

## 2023-05-31 NOTE — Patient Instructions (Signed)
 Visit Information  Thank you for taking time to visit with me today. Please don't hesitate to contact me if I can be of assistance to you.   Following are the goals we discussed today:   Goals Addressed             This Visit's Progress    Patient has stress related to managing medical needs       Interventions:  Spoke with client via phone  today about his current needs and status Client said he was doing well. He did say he had appointment yesterday with cardiologist. Client said he plans to see cardiologist every 6 months. Client did have heart procedure completed in 2022.  Client said he wants to monitor his heart needs closely Client and LCSW spoke of energy level of client.  Client said sometimes he gets short of breath. He said he takes rest breaks as needed Discussed family support for client. Client said that his grandson and spouse of grandson reside with client.  They are very helpful to client Discussed meal provision for client. Client said he goes to local restaurant to eat 5 to 6 times per week Discussed client support from PCP, Dr. Leta Fear.  Client sees Dr. Fear for medical support in [place], [place].  Client said it is not a long drive from his home to appointments with Dr. Fear Discussed program support with client. Discussed RN, Pharmacist and LCSW  support.  Reviewed pain issues of client. He spoke of knee pain issues Client is driving short trips as needed. Discussed medication procurement for client   Client uses a cane as needed to help him walk. He said he uses a cane to help him with balance when he walks. He is dizzy occasionally Discussed sleeping issues of client  Provided counseling support for client Thanked client for phone call with LCSW today Encouraged client to call LCSW as needed at [phone].for social work support Client was appreciative of call from LCSW today          Our next appointment is by telephone on 08/01/23 at 3:00 PM   Please  call the care guide team at [phone] if you need to cancel or reschedule your appointment.   If you are experiencing a Mental Health or Behavioral Health Crisis or need someone to talk to, please go to [facility] [address], [place] [phone])   The patient verbalized understanding of instructions, educational materials, and care plan provided today and DECLINED offer to receive copy of patient instructions, educational materials, and care plan.   The patient has been provided with contact information for the care management team and has been advised to call with any health related questions or concerns.   Derek Bond.Jonni Oelkers MSW, LCSW Licensed Visual Merchandiser [facility] Care Management [phone]

## 2023-05-31 NOTE — Patient Outreach (Signed)
 Care Coordination   Follow Up Visit Note   05/31/2023 Name: Derek Bond [facility] MRN: [id number] DOB: 30-Nov-1945  Derek Bond is a 78 y.o. year old male who sees Vyas, Dhruv B, MD for primary care. I spoke with  Derek Bond by phone today.  What matters to the patients health and wellness today? Patient has stress related to managing medical needs    Goals Addressed             This Visit's Progress    Patient has stress related to managing medical needs       Interventions:  Spoke with client via phone  today about his current needs and status Client said he was doing well. He did say he had appointment yesterday with cardiologist. Client said he plans to see cardiologist every 6 months. Client did have heart procedure completed in 2022.  Client said he wants to monitor his heart needs closely Client and LCSW spoke of energy level of client.  Client said sometimes he gets short of breath. He said he takes rest breaks as needed Discussed family support for client. Client said that his grandson and spouse of grandson reside with client.  They are very helpful to client Discussed meal provision for client. Client said he goes to local restaurant to eat 5 to 6 times per week Discussed client support from PCP, Dr. Leta Fear.  Client sees Dr. Fear for medical support in [place], [place].  Client said it is not a long drive from his home to appointments with Dr. Fear Discussed program support with client. Discussed RN, Pharmacist and LCSW  support.  Reviewed pain issues of client. He spoke of knee pain issues Client is driving short trips as needed. Discussed medication procurement for client   Client uses a cane as needed to help him walk. He said he uses a cane to help him with balance when he walks. He is dizzy occasionally Discussed sleeping issues of client  Provided counseling support for client Thanked client for phone call with LCSW today Encouraged client to call LCSW as needed  at [phone].for social work support Client was appreciative of call from LCSW today          SDOH assessments and interventions completed:  Yes  SDOH Interventions Today    Flowsheet Row Most Recent Value  SDOH Interventions   Depression Interventions/Treatment  Counseling  Physical Activity Interventions Other (Comments)  [client gets short of breath occasionally,  he has to take rest breaks occasionally]  Stress Interventions Provide Counseling  [client spoke of heart procedure he had in 2022.  He has some stress occasionally in managing medical needs]        Care Coordination Interventions:  Yes, provided   Interventions Today    Flowsheet Row Most Recent Value  Chronic Disease   Chronic disease during today's visit Other  [spoke with client about client needs]  General Interventions   General Interventions Discussed/Reviewed General Interventions Discussed, Community Resources  Education Interventions   Education Provided Provided Education  Provided Verbal Education On [employer]  Mental Health Interventions   Mental Health Discussed/Reviewed Coping Strategies  [no mood issues noted by client.]  Nutrition Interventions   Nutrition Discussed/Reviewed Nutrition Discussed  [client eats some meals at local restaurant]  Pharmacy Interventions   Pharmacy Dicussed/Reviewed Pharmacy Topics Discussed  Safety Interventions   Safety Discussed/Reviewed Fall Risk        Follow up plan: Follow up call scheduled for 08/01/23  at 3:00 PM     Encounter Outcome:  Patient Visit Completed   Ozell RAMAN.Elspeth Blucher MSW, LCSW Licensed Visual Merchandiser [facility] Care Management [phone]

## 2023-06-05 DIAGNOSIS — G4733 Obstructive sleep apnea (adult) (pediatric): Secondary | ICD-10-CM | POA: Diagnosis not present

## 2023-06-08 DIAGNOSIS — D849 Immunodeficiency, unspecified: Secondary | ICD-10-CM | POA: Diagnosis not present

## 2023-06-08 DIAGNOSIS — N471 Phimosis: Secondary | ICD-10-CM | POA: Diagnosis not present

## 2023-06-08 DIAGNOSIS — I10 Essential (primary) hypertension: Secondary | ICD-10-CM | POA: Diagnosis not present

## 2023-06-08 DIAGNOSIS — N281 Cyst of kidney, acquired: Secondary | ICD-10-CM | POA: Diagnosis not present

## 2023-06-08 DIAGNOSIS — Z299 Encounter for prophylactic measures, unspecified: Secondary | ICD-10-CM | POA: Diagnosis not present

## 2023-06-08 DIAGNOSIS — M059 Rheumatoid arthritis with rheumatoid factor, unspecified: Secondary | ICD-10-CM | POA: Diagnosis not present

## 2023-06-12 ENCOUNTER — Other Ambulatory Visit: Payer: Self-pay | Admitting: Cardiology

## 2023-06-15 DIAGNOSIS — E1165 Type 2 diabetes mellitus with hyperglycemia: Secondary | ICD-10-CM | POA: Diagnosis not present

## 2023-06-15 DIAGNOSIS — R809 Proteinuria, unspecified: Secondary | ICD-10-CM | POA: Diagnosis not present

## 2023-06-15 DIAGNOSIS — I429 Cardiomyopathy, unspecified: Secondary | ICD-10-CM | POA: Diagnosis not present

## 2023-06-15 DIAGNOSIS — I10 Essential (primary) hypertension: Secondary | ICD-10-CM | POA: Diagnosis not present

## 2023-06-15 DIAGNOSIS — E1129 Type 2 diabetes mellitus with other diabetic kidney complication: Secondary | ICD-10-CM | POA: Diagnosis not present

## 2023-06-15 DIAGNOSIS — Z299 Encounter for prophylactic measures, unspecified: Secondary | ICD-10-CM | POA: Diagnosis not present

## 2023-06-15 DIAGNOSIS — I5022 Chronic systolic (congestive) heart failure: Secondary | ICD-10-CM | POA: Diagnosis not present

## 2023-06-23 DIAGNOSIS — N281 Cyst of kidney, acquired: Secondary | ICD-10-CM | POA: Diagnosis not present

## 2023-07-06 DIAGNOSIS — G4733 Obstructive sleep apnea (adult) (pediatric): Secondary | ICD-10-CM | POA: Diagnosis not present

## 2023-07-11 DIAGNOSIS — L089 Local infection of the skin and subcutaneous tissue, unspecified: Secondary | ICD-10-CM | POA: Diagnosis not present

## 2023-07-11 DIAGNOSIS — D485 Neoplasm of uncertain behavior of skin: Secondary | ICD-10-CM | POA: Diagnosis not present

## 2023-07-11 DIAGNOSIS — L570 Actinic keratosis: Secondary | ICD-10-CM | POA: Diagnosis not present

## 2023-07-11 NOTE — Progress Notes (Signed)
 07/19/2023 3:41 PM   Derek Bond 1945-09-04 [id number]  Referring provider: Ignatius Specking, MD [address] [place],  [place] [address]  No chief complaint on file.   HPI: 78 year old male in referred by Dr. Sherril Croon for evaluation/management of phimosis as well as renal cyst.  The patient is diabetic.  He is uncircumcised.  He does not have problems retracting his foreskin but does have occasional fungal infections of his glans/foreskin.  In the past she has used cream for this which clears up fairly well.  He denies significant lower urinary tract symptoms.  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  This revealed an indeterminant mass of the left kidney, most likely cyst.  Following that he underwent renal ultrasound revealing simple cysts bilaterally, no renal masses.    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    Due to laceration at age 89    Hiatal hernia    History of melanoma    Obstructive sleep apnea    Type 2 diabetes mellitus (HCC)     Surgical History: Past Surgical History:  Procedure Laterality Date   BIOPSY  12/21/2019   Procedure: BIOPSY;  Surgeon: Dolores Frame, MD;  Location: [facility] ENDO [facility];  Service: Gastroenterology;;  random colon   CARDIOVERSION N/A 06/10/2021   Procedure: CARDIOVERSION;  Surgeon: Tessa Lerner, DO;  Location: [facility] ENDOSCOPY;  Service: Cardiovascular;  Laterality: N/A;   CLIPPING OF ATRIAL APPENDAGE N/A 02/26/2021   Procedure: CLIPPING OF ATRIAL APPENDAGE USING ATRICURE CLIP SIZE 45;  Surgeon: Corliss Skains, MD;  Location: [facility] OR;  Service: Open Heart Surgery;  Laterality: N/A;   COLONOSCOPY WITH PROPOFOL N/A 12/21/2019   Procedure: COLONOSCOPY WITH PROPOFOL;  Surgeon: Dolores Frame, MD;  Location: [facility]  ENDO SUITE;  Service: Gastroenterology;  Laterality: N/A;  915   CORONARY ARTERY BYPASS GRAFT N/A 02/26/2021   Procedure: CORONARY ARTERY BYPASS GRAFTING (CABG) X4, ON PUMP, USING LEFT INTERNAL MAMMARY ARTERY AND RIGHT ENDOSCOPICALLY HARVESTED GREATER SAPHENOUS VEIN;  Surgeon: Corliss Skains, MD;  Location: [facility] OR;  Service: Open Heart Surgery;  Laterality: N/A;   ENDOVEIN HARVEST OF GREATER SAPHENOUS VEIN Right 02/26/2021   Procedure: ENDOVEIN HARVEST OF GREATER SAPHENOUS VEIN;  Surgeon: Corliss Skains, MD;  Location: [facility] OR;  Service: Open Heart Surgery;  Laterality: Right;   MAZE N/A 02/26/2021   Procedure: MAZE;  Surgeon: Corliss Skains, MD;  Location: [facility] OR;  Service: Open Heart Surgery;  Laterality: N/A;   MELANOMA EXCISION     Left facial region   REPLACEMENT TOTAL KNEE Left    RIGHT/LEFT HEART CATH AND CORONARY ANGIOGRAPHY N/A 02/23/2021   Procedure: RIGHT/LEFT HEART CATH AND CORONARY ANGIOGRAPHY;  Surgeon: Elder Negus, MD;  Location: [facility] INVASIVE CV LAB;  Service: Cardiovascular;  Laterality: N/A;   TEE WITHOUT CARDIOVERSION N/A 02/26/2021   Procedure: TRANSESOPHAGEAL ECHOCARDIOGRAM (TEE);  Surgeon: Corliss Skains, MD;  Location: [facility] OR;  Service: Open Heart Surgery;  Laterality: N/A;   TEE WITHOUT CARDIOVERSION N/A 06/10/2021   Procedure: TRANSESOPHAGEAL ECHOCARDIOGRAM (TEE);  Surgeon: Tessa Lerner, DO;  [facility] ENDOS[facility];  Service: Cardiovascular;  Laterality: N/A;   UMBILICAL HERNIA REPAIR      Home Medications:  Allergies as of  07/19/2023       Reactions   Bee Venom Swelling        Medication List        Accurate as of July 11, 2023  3:41 PM. If you have any questions, ask your nurse or doctor.          acetaminophen 500 MG tablet Commonly known as: TYLENOL Take 1,000 mg by mouth every 6 (six) hours as needed for moderate pain or mild pain.   amoxicillin-clavulanate 875-125 MG tablet Commonly known as: AUGMENTIN Take 1 tablet  by mouth every 12 (twelve) hours.   aspirin EC 81 MG tablet Take 81 mg by mouth daily.   Eliquis 5 MG Tabs tablet Generic drug: apixaban TAKE 1 TABLET TWICE DAILY   Entresto 97-103 MG Generic drug: sacubitril-valsartan TAKE 1 TABLET TWICE DAILY   famotidine 40 MG tablet Commonly known as: PEPCID Take 40 mg by mouth at bedtime.   Farxiga 10 MG Tabs tablet Generic drug: dapagliflozin propanediol TAKE 1 TABLET EVERY DAY   glipiZIDE 10 MG 24 hr tablet Commonly known as: GLUCOTROL XL Take 10 mg by mouth 2 (two) times daily.   magnesium oxide 400 (240 Mg) MG tablet Commonly known as: MAG-OX TAKE 1 TABLET EVERY DAY   metFORMIN 1000 MG tablet Commonly known as: GLUCOPHAGE Take 1,000 mg by mouth daily with breakfast.   metoprolol succinate 25 MG 24 hr tablet Commonly known as: TOPROL-XL Take 1 tablet (25 mg total) by mouth every morning.   Multaq 400 MG tablet Generic drug: dronedarone TAKE 1 TABLET TWICE DAILY WITH MEALS   nitroGLYCERIN 0.4 MG SL tablet Commonly known as: NITROSTAT Place under the tongue.   pantoprazole 40 MG tablet Commonly known as: PROTONIX Take 40 mg by mouth every evening.   potassium chloride 10 MEQ tablet Commonly known as: KLOR-CON M TAKE 1 TABLET EVERY DAY   ranolazine 500 MG 12 hr tablet Commonly known as: RANEXA Take 1 tablet (500 mg total) by mouth daily.   rosuvastatin 5 MG tablet Commonly known as: CRESTOR Take 5 mg by mouth at bedtime.        Allergies:  Allergies  Allergen Reactions   Bee Venom Swelling    Family History: Family History  Problem Relation Age of Onset   Diabetes Mother        Died in a house fire   Lung cancer Father    Lung cancer Sister    Hypertension Sister    Heart disease Sister    Hypertension Sister    Heart attack Brother    Hypertension Brother    Diabetes Brother    Heart attack Brother    Hypertension Brother    Hypertension Other    Diabetes Other    Rheum arthritis Other     Stroke Other     Social History:  reports that he quit smoking about 34 years ago. His smoking use included cigarettes. He started smoking about 64 years ago. He has a 60 pack-year smoking history. He has never used smokeless tobacco. He reports that he does not currently use alcohol. He reports that he does not use drugs.  ROS: All other review of systems were reviewed and are negative except what is noted above in HPI  Physical Exam: There were no vitals taken for this visit.  Constitutional:  Alert and oriented, No acute distress. HEENT: [place] AT, moist mucus membranes.  Trachea midline, no masses. Cardiovascular: No clubbing, cyanosis, or edema. Respiratory: Normal respiratory  effort, no increased work of breathing. GI: No Ingal hernia. GU: Phallus is uncircumcised.  There is no phimosis.  No penile lesions. Lymph: No cervical or inguinal lymphadenopathy. Skin: No rashes, bruises or suspicious lesions. Neurologic: Grossly intact, no focal deficits, moving all 4 extremities. Psychiatric: Normal mood and affect.  Laboratory Data: Lab Results  Component Value Date   WBC 6.4 02/17/2023   HGB 15.6 02/17/2023   HCT 47.4 02/17/2023   MCV 93.5 02/17/2023   PLT 212 02/17/2023    Lab Results  Component Value Date   CREATININE 0.96 02/17/2023    No results found for: "PSA"  No results found for: "TESTOSTERONE"  Lab Results  Component Value Date   HGBA1C 8.7 (H) 12/13/2021    Urinalysis    Component Value Date/Time   COLORURINE YELLOW 02/24/2021 1512   APPEARANCEUR CLEAR 02/24/2021 1512   LABSPEC 1.036 (H) 02/24/2021 1512   PHURINE 5.0 02/24/2021 1512   GLUCOSEU >=500 (A) 02/24/2021 1512   HGBUR NEGATIVE 02/24/2021 1512   BILIRUBINUR NEGATIVE 02/24/2021 1512   KETONESUR NEGATIVE 02/24/2021 1512   PROTEINUR NEGATIVE 02/24/2021 1512   NITRITE NEGATIVE 02/24/2021 1512   LEUKOCYTESUR NEGATIVE 02/24/2021 1512    Lab Results  Component Value Date   BACTERIA NONE SEEN  02/24/2021    Pertinent Imaging: 2.6.2025  renal ultrasound FINDINGS: Right Kidney:  Renal measurements: 12.4 x 7.3 x 7.8 cm = volume: 336.1 mL. Normal renal cortical thickness and echogenicity. No hydronephrosis. There is a 1.3 cm cyst. There is a 1.2 cm cyst. Left Kidney: Renal measurements: 11.9 x 6.3 x 8.0 cm = volume: 314.6 mL. Normal renal cortical thickness and echogenicity. No hydronephrosis. There is a 1.7 cm cyst. There is a 1.8 cm cyst. Bladder: Appears normal for degree of bladder distention. Other: None. IMPRESSION: 1. No hydronephrosis. 2. Bilateral renal cysts.   I also reviewed the patient's CT images with him.  Assessment & Plan:   -Balanitis, history of, today no evidence of infection  -Simple renal cysts bilaterally  Plan:  -Patient reassured about his renal cyst, no further diagnostic studies needed  -He was given a prescription for Lotrisone cream  -Return as needed.  No follow-ups on file.  Chelsea Aus, MD  [facility] [place]

## 2023-07-12 DIAGNOSIS — E1142 Type 2 diabetes mellitus with diabetic polyneuropathy: Secondary | ICD-10-CM | POA: Diagnosis not present

## 2023-07-12 DIAGNOSIS — M79676 Pain in unspecified toe(s): Secondary | ICD-10-CM | POA: Diagnosis not present

## 2023-07-12 DIAGNOSIS — B351 Tinea unguium: Secondary | ICD-10-CM | POA: Diagnosis not present

## 2023-07-12 DIAGNOSIS — L84 Corns and callosities: Secondary | ICD-10-CM | POA: Diagnosis not present

## 2023-07-14 ENCOUNTER — Other Ambulatory Visit: Payer: Self-pay | Admitting: Cardiology

## 2023-07-14 DIAGNOSIS — I5022 Chronic systolic (congestive) heart failure: Secondary | ICD-10-CM

## 2023-07-15 DIAGNOSIS — I10 Essential (primary) hypertension: Secondary | ICD-10-CM | POA: Diagnosis not present

## 2023-07-15 DIAGNOSIS — E1165 Type 2 diabetes mellitus with hyperglycemia: Secondary | ICD-10-CM | POA: Diagnosis not present

## 2023-07-19 ENCOUNTER — Encounter: Payer: Self-pay | Admitting: Urology

## 2023-07-19 ENCOUNTER — Ambulatory Visit (INDEPENDENT_AMBULATORY_CARE_PROVIDER_SITE_OTHER): Payer: Medicare PPO | Admitting: Urology

## 2023-07-19 VITALS — BP 155/86 | HR 73

## 2023-07-19 DIAGNOSIS — N481 Balanitis: Secondary | ICD-10-CM

## 2023-07-19 DIAGNOSIS — Z87438 Personal history of other diseases of male genital organs: Secondary | ICD-10-CM | POA: Diagnosis not present

## 2023-07-19 DIAGNOSIS — N281 Cyst of kidney, acquired: Secondary | ICD-10-CM | POA: Diagnosis not present

## 2023-07-19 DIAGNOSIS — N471 Phimosis: Secondary | ICD-10-CM

## 2023-07-19 MED ORDER — CLOTRIMAZOLE-BETAMETHASONE 1-0.05 % EX CREA
TOPICAL_CREAM | CUTANEOUS | 1 refills | Status: AC
Start: 2023-07-19 — End: ?

## 2023-07-20 LAB — URINALYSIS, ROUTINE W REFLEX MICROSCOPIC
Bilirubin, UA: NEGATIVE
Leukocytes,UA: NEGATIVE
Nitrite, UA: NEGATIVE
Protein,UA: NEGATIVE
RBC, UA: NEGATIVE
Specific Gravity, UA: 1.025 (ref 1.005–1.030)
Urobilinogen, Ur: 0.2 mg/dL (ref 0.2–1.0)
pH, UA: 5.5 (ref 5.0–7.5)

## 2023-08-01 ENCOUNTER — Ambulatory Visit: Payer: Self-pay | Admitting: Licensed Clinical Social Worker

## 2023-08-01 DIAGNOSIS — I10 Essential (primary) hypertension: Secondary | ICD-10-CM | POA: Diagnosis not present

## 2023-08-01 DIAGNOSIS — Z299 Encounter for prophylactic measures, unspecified: Secondary | ICD-10-CM | POA: Diagnosis not present

## 2023-08-01 DIAGNOSIS — R600 Localized edema: Secondary | ICD-10-CM | POA: Diagnosis not present

## 2023-08-01 NOTE — Patient Instructions (Signed)
 Visit Information  Thank you for taking time to visit with me today. Please don't hesitate to contact me if I can be of assistance to you.   Following are the goals we discussed today:   Goals Addressed             This Visit's Progress    Patient has stress related to managing medical needs       Interventions:  Spoke with client via phone  today about his current needs and status Client said he was doing well. He said he did have an appointment today with his PCP. Client goes to [place], [place] for PCP appointments with Dr. Ignatius Specking. Client said he plans to see cardiologist every 6 months. Client did have heart procedure completed in 2022.  Client said he wants to monitor his heart needs closely Client and LCSW spoke of energy level of client.  Client said sometimes he gets short of breath. He said he takes rest breaks as needed Client drives his car short distances as needed. Discussed edema issues of client. Client talked today with PCP about edema issues. Discussed family support for client. Client said that his grandson and spouse of grandson reside with client.  They are very helpful to client Discussed meal provision for client. Client said he goes to local restaurant to eat 5 to 6 times per week Discussed program support with client. Discussed RN, Pharmacist and LCSW  support.  Reviewed pain issues of client. He spoke of knee pain issues Client is driving short trips as needed. Discussed medication procurement for client   Client uses a cane as needed to help him walk. He said he uses a cane to help him with balance when he walks. He is dizzy occasionally Discussed sleeping issues of client  Provided counseling support for client Thanked client for phone call with LCSW today Encouraged client to call LCSW as needed at [phone].for social work support Client was appreciative of call from LCSW today          Our next appointment is by telephone on 09/05/23 at 3:00 PM    Please call the care guide team at [phone] if you need to cancel or reschedule your appointment.   If you are experiencing a Mental Health or Behavioral Health Crisis or need someone to talk to, please go to [facility] [address] [place] [phone])   The patient verbalized understanding of instructions, educational materials, and care plan provided today and DECLINED offer to receive copy of patient instructions, educational materials, and care plan.   The patient has been provided with contact information for the care management team and has been advised to call with any health related questions or concerns.    Lorna Few  MSW, LCSW [place] Licensed Clinical Social Worker Direct Dial:  [phone] Fax:  [phone] Website:  [email or web address]

## 2023-08-01 NOTE — Patient Outreach (Signed)
 Care Coordination   Follow Up Visit Note   08/01/2023 Name: Derek Bond [facility] MRN: [id number] DOB: 1945/09/19  Derek Bond is a 78 y.o. year old male who sees Vyas, Dhruv B, MD for primary care. I spoke with  Derek Bond by phone today.  What matters to the patients health and wellness today?  Patient has stress related to managing medical needs    Goals Addressed             This Visit's Progress    Patient has stress related to managing medical needs       Interventions:  Spoke with client via phone  today about his current needs and status Client said he was doing well. He said he did have an appointment today with his PCP. Client goes to [place], [place] for PCP appointments with Dr. Ignatius Specking. Client said he plans to see cardiologist every 6 months. Client did have heart procedure completed in 2022.  Client said he wants to monitor his heart needs closely Client and LCSW spoke of energy level of client.  Client said sometimes he gets short of breath. He said he takes rest breaks as needed Client drives his car short distances as needed. Discussed edema issues of client. Client talked today with PCP about edema issues. Discussed family support for client. Client said that his grandson and spouse of grandson reside with client.  They are very helpful to client Discussed meal provision for client. Client said he goes to local restaurant to eat 5 to 6 times per week Discussed program support with client. Discussed RN, Pharmacist and LCSW  support.  Reviewed pain issues of client. He spoke of knee pain issues Client is driving short trips as needed. Discussed medication procurement for client   Client uses a cane as needed to help him walk. He said he uses a cane to help him with balance when he walks. He is dizzy occasionally Discussed sleeping issues of client  Provided counseling support for client Thanked client for phone call with LCSW today Encouraged client to call  LCSW as needed at [phone].for social work support Client was appreciative of call from LCSW today          SDOH assessments and interventions completed:  Yes  SDOH Interventions Today    Flowsheet Row Most Recent Value  SDOH Interventions   Depression Interventions/Treatment  Counseling  Physical Activity Interventions Other (Comments)  [occasional mobility challenges . Uses a cane to help him walk]  Stress Interventions Other (Comment)  [has stress in managing medical needs]        Care Coordination Interventions:  Yes, provided   Interventions Today    Flowsheet Row Most Recent Value  Chronic Disease   Chronic disease during today's visit Other  [spoke with client about client needs]  General Interventions   General Interventions Discussed/Reviewed General Interventions Discussed, Community Resources  Education Interventions   Education Provided Provided Education  Provided Verbal Education On [employer]  Mental Health Interventions   Mental Health Discussed/Reviewed Coping Strategies  Nutrition Interventions   Nutrition Discussed/Reviewed Nutrition Discussed  Pharmacy Interventions   Pharmacy Dicussed/Reviewed Pharmacy Topics Discussed  Safety Interventions   Safety Discussed/Reviewed Fall Risk        Follow up plan: Follow up call scheduled for 09/05/23 at 3:00 PM     Encounter Outcome:  Patient Visit Completed    Lorna Few  MSW, LCSW [place] Licensed Clinical  Social Music therapist Dial:  [phone] Fax:  [phone] Website:  [email or web address]

## 2023-08-03 DIAGNOSIS — G4733 Obstructive sleep apnea (adult) (pediatric): Secondary | ICD-10-CM | POA: Diagnosis not present

## 2023-08-14 DIAGNOSIS — E1165 Type 2 diabetes mellitus with hyperglycemia: Secondary | ICD-10-CM | POA: Diagnosis not present

## 2023-08-14 DIAGNOSIS — I10 Essential (primary) hypertension: Secondary | ICD-10-CM | POA: Diagnosis not present

## 2023-08-24 DIAGNOSIS — I739 Peripheral vascular disease, unspecified: Secondary | ICD-10-CM | POA: Diagnosis not present

## 2023-08-24 DIAGNOSIS — Z0000 Encounter for general adult medical examination without abnormal findings: Secondary | ICD-10-CM | POA: Diagnosis not present

## 2023-08-24 DIAGNOSIS — Z299 Encounter for prophylactic measures, unspecified: Secondary | ICD-10-CM | POA: Diagnosis not present

## 2023-08-24 DIAGNOSIS — I272 Pulmonary hypertension, unspecified: Secondary | ICD-10-CM | POA: Diagnosis not present

## 2023-08-24 DIAGNOSIS — E1165 Type 2 diabetes mellitus with hyperglycemia: Secondary | ICD-10-CM | POA: Diagnosis not present

## 2023-08-24 DIAGNOSIS — Z1331 Encounter for screening for depression: Secondary | ICD-10-CM | POA: Diagnosis not present

## 2023-08-24 DIAGNOSIS — Z7189 Other specified counseling: Secondary | ICD-10-CM | POA: Diagnosis not present

## 2023-08-24 DIAGNOSIS — Z1339 Encounter for screening examination for other mental health and behavioral disorders: Secondary | ICD-10-CM | POA: Diagnosis not present

## 2023-08-24 DIAGNOSIS — I10 Essential (primary) hypertension: Secondary | ICD-10-CM | POA: Diagnosis not present

## 2023-08-26 ENCOUNTER — Other Ambulatory Visit: Payer: Self-pay | Admitting: Cardiology

## 2023-08-26 DIAGNOSIS — I480 Paroxysmal atrial fibrillation: Secondary | ICD-10-CM

## 2023-08-26 DIAGNOSIS — Z7901 Long term (current) use of anticoagulants: Secondary | ICD-10-CM

## 2023-08-26 NOTE — Telephone Encounter (Signed)
 Prescription refill request for Eliquis received. Indication:afib Last office visit:1/25 Scr:0.96  10/24 Age: 78 Weight:87  kg  Prescription refilled

## 2023-09-03 DIAGNOSIS — G4733 Obstructive sleep apnea (adult) (pediatric): Secondary | ICD-10-CM | POA: Diagnosis not present

## 2023-09-05 ENCOUNTER — Encounter: Payer: Self-pay | Admitting: Licensed Clinical Social Worker

## 2023-09-14 DIAGNOSIS — I10 Essential (primary) hypertension: Secondary | ICD-10-CM | POA: Diagnosis not present

## 2023-09-14 DIAGNOSIS — Z299 Encounter for prophylactic measures, unspecified: Secondary | ICD-10-CM | POA: Diagnosis not present

## 2023-09-14 DIAGNOSIS — I5022 Chronic systolic (congestive) heart failure: Secondary | ICD-10-CM | POA: Diagnosis not present

## 2023-09-14 DIAGNOSIS — E1165 Type 2 diabetes mellitus with hyperglycemia: Secondary | ICD-10-CM | POA: Diagnosis not present

## 2023-09-20 DIAGNOSIS — E1142 Type 2 diabetes mellitus with diabetic polyneuropathy: Secondary | ICD-10-CM | POA: Diagnosis not present

## 2023-09-20 DIAGNOSIS — L84 Corns and callosities: Secondary | ICD-10-CM | POA: Diagnosis not present

## 2023-09-20 DIAGNOSIS — M79674 Pain in right toe(s): Secondary | ICD-10-CM | POA: Diagnosis not present

## 2023-09-20 DIAGNOSIS — B351 Tinea unguium: Secondary | ICD-10-CM | POA: Diagnosis not present

## 2023-09-20 DIAGNOSIS — M79675 Pain in left toe(s): Secondary | ICD-10-CM | POA: Diagnosis not present

## 2023-10-03 DIAGNOSIS — G4733 Obstructive sleep apnea (adult) (pediatric): Secondary | ICD-10-CM | POA: Diagnosis not present

## 2023-10-15 DIAGNOSIS — I10 Essential (primary) hypertension: Secondary | ICD-10-CM | POA: Diagnosis not present

## 2023-10-15 DIAGNOSIS — E1165 Type 2 diabetes mellitus with hyperglycemia: Secondary | ICD-10-CM | POA: Diagnosis not present

## 2023-11-03 DIAGNOSIS — G4733 Obstructive sleep apnea (adult) (pediatric): Secondary | ICD-10-CM | POA: Diagnosis not present

## 2023-11-14 DIAGNOSIS — E1165 Type 2 diabetes mellitus with hyperglycemia: Secondary | ICD-10-CM | POA: Diagnosis not present

## 2023-11-14 DIAGNOSIS — I10 Essential (primary) hypertension: Secondary | ICD-10-CM | POA: Diagnosis not present

## 2023-11-29 DIAGNOSIS — M79674 Pain in right toe(s): Secondary | ICD-10-CM | POA: Diagnosis not present

## 2023-11-29 DIAGNOSIS — B351 Tinea unguium: Secondary | ICD-10-CM | POA: Diagnosis not present

## 2023-11-29 DIAGNOSIS — E1142 Type 2 diabetes mellitus with diabetic polyneuropathy: Secondary | ICD-10-CM | POA: Diagnosis not present

## 2023-11-29 DIAGNOSIS — L84 Corns and callosities: Secondary | ICD-10-CM | POA: Diagnosis not present

## 2023-11-29 DIAGNOSIS — M79675 Pain in left toe(s): Secondary | ICD-10-CM | POA: Diagnosis not present

## 2023-12-01 ENCOUNTER — Other Ambulatory Visit: Payer: Self-pay | Admitting: Cardiology

## 2023-12-01 DIAGNOSIS — I5022 Chronic systolic (congestive) heart failure: Secondary | ICD-10-CM

## 2023-12-05 ENCOUNTER — Encounter: Payer: Self-pay | Admitting: Cardiology

## 2023-12-05 ENCOUNTER — Ambulatory Visit: Attending: Cardiology | Admitting: Cardiology

## 2023-12-05 VITALS — BP 122/74 | HR 72 | Resp 16 | Ht 78.0 in | Wt 200.8 lb

## 2023-12-05 DIAGNOSIS — I2510 Atherosclerotic heart disease of native coronary artery without angina pectoris: Secondary | ICD-10-CM | POA: Diagnosis not present

## 2023-12-05 DIAGNOSIS — I4819 Other persistent atrial fibrillation: Secondary | ICD-10-CM

## 2023-12-05 DIAGNOSIS — Z79899 Other long term (current) drug therapy: Secondary | ICD-10-CM | POA: Diagnosis not present

## 2023-12-05 DIAGNOSIS — Z87891 Personal history of nicotine dependence: Secondary | ICD-10-CM

## 2023-12-05 DIAGNOSIS — Z8679 Personal history of other diseases of the circulatory system: Secondary | ICD-10-CM

## 2023-12-05 DIAGNOSIS — Z7901 Long term (current) use of anticoagulants: Secondary | ICD-10-CM | POA: Diagnosis not present

## 2023-12-05 DIAGNOSIS — I350 Nonrheumatic aortic (valve) stenosis: Secondary | ICD-10-CM

## 2023-12-05 DIAGNOSIS — E1129 Type 2 diabetes mellitus with other diabetic kidney complication: Secondary | ICD-10-CM

## 2023-12-05 DIAGNOSIS — I5032 Chronic diastolic (congestive) heart failure: Secondary | ICD-10-CM

## 2023-12-05 DIAGNOSIS — I77810 Thoracic aortic ectasia: Secondary | ICD-10-CM | POA: Diagnosis not present

## 2023-12-05 DIAGNOSIS — R809 Proteinuria, unspecified: Secondary | ICD-10-CM

## 2023-12-05 DIAGNOSIS — Z951 Presence of aortocoronary bypass graft: Secondary | ICD-10-CM

## 2023-12-05 DIAGNOSIS — Z9889 Other specified postprocedural states: Secondary | ICD-10-CM | POA: Diagnosis not present

## 2023-12-05 DIAGNOSIS — I10 Essential (primary) hypertension: Secondary | ICD-10-CM

## 2023-12-05 DIAGNOSIS — Z794 Long term (current) use of insulin: Secondary | ICD-10-CM

## 2023-12-05 DIAGNOSIS — I480 Paroxysmal atrial fibrillation: Secondary | ICD-10-CM

## 2023-12-05 NOTE — Progress Notes (Unsigned)
 Cardiology Office Note:  .   Date:  12/05/2023  ID:  Derek Bond, DOB 23-Jun-1945, MRN [id number] PCP:  Derek Leta NOVAK, MD  Former Cardiology Providers: NA [facility] Providers Cardiologist:  Madonna Large, DO , [facility] : [facility] (established care 01/2021) Electrophysiologist:  None  Click to update primary MD,subspecialty MD or APP then REFRESH:1}    Chief Complaint  Patient presents with   Atherosclerosis of native coronary artery of native heart w   Follow-up    History of Present Illness: .   Derek Bond is a 78 y.o. Caucasian male whose past medical history and cardiovascular risk factors includes: Hx of ischemic cardiomyopathy (recovered as of Dec 2024), heart failure with improved EF, CAD s/p CABG / Clipping of atrial appendage using 45mm atrial clip, aortic stenosis, persistent atrial fibrillation status post DDCV (06/10/2021), hypertension, insulin -dependent diabetes mellitus with microalbuminuria, sleep apnea, erectile dysfunction, heartburn, former smoker, hyperlipidemia, sleep apnea not on CPAP,, advanced age.   Coronary disease /heart failure with improved EF: Underwent four-vessel bypass surgery, with up titration of antianginal therapy and GDMT patient remains free of angina pectoris or heart failure symptoms.  Echocardiogram December 2024 noted normal LVEF.  Denies anginal chest pain or heart failure symptoms.  Compliant with current medical therapy.  Persistent atrial fibrillation: In 2022, at the time of bypass surgery patient underwent left atrial Maze procedure, 45 mm atrial clip placement as well.  Currently on any beta-blocking agents, antiarrhythmic medications, and anticoagulation for thromboembolic prophylaxis.  His last TEE cardioversion was in January 2023.  Does not endorse evidence of bleeding.  Remains on Multaq .  Due to easy bruisability patient questions if he could discontinue aspirin  81 mg p.o. daily.  Aortic stenosis: Denies anginal chest pain, heart  failure symptoms, near-syncope or syncopal event.  Most recent echocardiogram notes the severity of aortic stenosis to be mild/moderate.  Denies anginal chest pain, heart failure symptoms, near-syncope or syncopal events.  Since last office visit patient states that he is now on insulin  for diabetes management  Review of Systems: .   Review of Systems  Constitutional: Positive for malaise/fatigue.  Cardiovascular:  Negative for chest pain, claudication, irregular heartbeat, leg swelling, near-syncope, orthopnea, palpitations, paroxysmal nocturnal dyspnea and syncope.  Respiratory:  Negative for shortness of breath.   Hematologic/Lymphatic: Negative for bleeding problem.    Studies Reviewed:   02/26/2021: CABG x4 (LIMA to LAD, SVG to DIAGONAL, SVG to OM, SVG to PLB) + MAZE + 45 mm atrial clip placement.   06/10/2021: TEE guided cardioversion: SR restored with synchronized biphasic 200 J x 2  EKG: EKG Interpretation Date/Time:  Monday December 05 2023 11:27:21 EDT Ventricular Rate:  71 PR Interval:  198 QRS Duration:  150 QT Interval:  454 QTC Calculation: 493 R Axis:   154  Text Interpretation: Normal sinus rhythm Right axis deviation Non-specific intra-ventricular conduction block When compared with ECG of 17-Feb-2023 13:12, Criteria for Lateral infarct are no longer Present T wave inversion now evident in Inferior leads Confirmed by Large Madonna [phone]) on 12/05/2023 11:35:36 AM  Echocardiogram: 01/2021: LVEF 25-30%, see report for additional details October 2022: LVEF 30-35%, see report for additional details July 2023: LVEF 40-45%, see report Between 2024: LVEF 45-50%, see report  04/21/2023 1. Left ventricular ejection fraction, by estimation, is 55 to 60%. The left ventricle has normal function. The left ventricle has no regional wall motion abnormalities. There is moderate asymmetric left ventricular hypertrophy of the basal-septal  segment. Indeterminate diastolic filling due  to  E-A fusion. 2. Right ventricular systolic function is normal. The right ventricular size is normal. 3. Left atrial size was mildly dilated. 4. The mitral valve is degenerative. Trivial mitral valve regurgitation. 5. Tricuspid valve regurgitation is mild to moderate. 6. The aortic valve is tricuspid. There is severe calcifcation of the aortic valve. Aortic valve regurgitation is not visualized. Mild to moderate aortic valve stenosis. 7. Aneurysm of the ascending aorta, measuring 44 mm. 8. IVC is not well visualized.  Plan RADIOLOGY: CT Chest w/o contrast:  05/18/2021 1. 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 Recommend follow-up CT/MR every 12 months and vascular consultation. Recommend annual imaging followup by CTA or MRA. This recommendation follows 2010 ACCF/AHA/AATS/ACR/ASA/SCA/SCAI/SIR/STS/SVM Guidelines for the Diagnosis and Management of Patients with Thoracic Aortic Disease. Circulation. 2010; 121: Z733-z630. Aortic aneurysm NOS (ICD10-I71.9) 2. Borderline dilated central pulmonary arteries, suggesting borderline pulmonary arterial hypertension. 3. Left atrial and left ventricular enlargement. 4. 1.5 cm exophytic mid left renal mass with a mean Hounsfield unit density of 18. This is probably a hemorrhagic or proteinaceous cyst, but a solid renal mass is not excluded. A renal ultrasound is recommended for further evaluation. 5. Cholelithiasis. 6. Calcific coronary artery and aortic atherosclerosis.  Aortic Atherosclerosis (ICD10-I70.0).   Risk Assessment/Calculations:   Click Here to Calculate/Change CHADS2VASc Score The patient's CHADS2-VASc score is 6, indicating a 9.7% annual risk of stroke.  Heart  CHF History: Yes HTN History: Yes Diabetes History: Yes Stroke History: No Vascular Disease History: Yes  Labs:       Latest Ref Rng & Units 02/17/2023    2:40 PM 10/24/2022    4:32 PM 10/08/2022   10:38 AM  CBC  WBC 4.0 - 10.5 K/uL 6.4  5.4    Hemoglobin 13.0 - 17.0 g/dL  84.3  85.9  85.4   Hematocrit 39.0 - 52.0 % 47.4  42.7  44.4   Platelets 150 - 400 K/uL 212  235         Latest Ref Rng & Units 02/17/2023    2:40 PM 10/24/2022    4:32 PM 10/08/2022   10:38 AM  BMP  Glucose 70 - 99 mg/dL 864  798  857   BUN 8 - 23 mg/dL 19  19  16    Creatinine 0.61 - 1.24 mg/dL 9.03  9.19  9.19   BUN/Creat Ratio 10 - 24   20   Sodium 135 - 145 mmol/L 134  135  140   Potassium 3.5 - 5.1 mmol/L 4.5  4.1  4.6   Chloride 98 - 111 mmol/L 99  104  103   CO2 22 - 32 mmol/L 24  24  22    Calcium  8.9 - 10.3 mg/dL 9.9  9.4  89.9       Latest Ref Rng & Units 02/17/2023    2:40 PM 10/24/2022    4:32 PM 10/08/2022   10:38 AM  CMP  Glucose 70 - 99 mg/dL 864  798  857   BUN 8 - 23 mg/dL 19  19  16    Creatinine 0.61 - 1.24 mg/dL 9.03  9.19  9.19   Sodium 135 - 145 mmol/L 134  135  140   Potassium 3.5 - 5.1 mmol/L 4.5  4.1  4.6   Chloride 98 - 111 mmol/L 99  104  103   CO2 22 - 32 mmol/L 24  24  22    Calcium  8.9 - 10.3 mg/dL 9.9  9.4  89.9     Lab  Results  Component Value Date   CHOL 120 12/13/2021   HDL 50 12/13/2021   LDLCALC 58 12/13/2021   LDLDIRECT 54 12/13/2021   TRIG 59 12/13/2021   CHOLHDL 2.4 12/13/2021   No results for input(s): LIPOA in the last 8760 hours. No components found for: NTPROBNP No results for input(s): PROBNP in the last 8760 hours.  No results for input(s): TSH in the last 8760 hours.  External Labs: Collected: August 2024 [facility] database. Total cholesterol 110, triglycerides 57, HDL 52, LDL calculated 45. TSH 1.72  Collected: October 2024 [facility] database. BUN 19, creatinine 0.96.   Physical Exam:    Today's Vitals   12/05/23 1124  BP: 122/74  Pulse: 72  Resp: 16  SpO2: 94%  Weight: 200 lb 12.8 oz (91.1 kg)  Height: 6' 6 (1.981 m)   Body mass index is 23.2 kg/m. Wt Readings from Last 3 Encounters:  12/05/23 200 lb 12.8 oz (91.1 kg)  05/30/23 191 lb 12.8 oz (87 kg)  02/17/23 191 lb (86.6 kg)    Physical Exam   Constitutional: No distress.  Age appropriate, hemodynamically stable.   Neck: No JVD present.  Cardiovascular: Normal rate, regular rhythm, S1 normal, S2 normal and intact distal pulses. Exam reveals no gallop, no S3 and no S4.  Murmur heard. Crescendo-decrescendo systolic murmur is present with a grade of 3/6. Pulses:      Dorsalis pedis pulses are 2+ on the right side and 2+ on the left side.       Posterior tibial pulses are 1+ on the right side and 1+ on the left side.  Circular discoloration, black, right second digit (see media section).   Pulmonary/Chest: Effort normal and breath sounds normal. No stridor. He has no wheezes. He has no rales.  sternotomy site well-healed.  Abdominal: Soft. Bowel sounds are normal. He exhibits no distension. There is no abdominal tenderness.  Musculoskeletal:        General: No edema.     Cervical back: Neck supple.  Neurological: He is alert and oriented to person, place, and time. He has intact cranial nerves (2-12).  Skin: Skin is warm and moist.     Impression & Recommendation(s):  Impression:   ICD-10-CM   1. Atherosclerosis of native coronary artery of native heart without angina pectoris  I25.10 EKG 12-Lead    2. S/P CABG x 4  Z95.1     3. Heart failure with improved ejection fraction (HFimpEF) (HCC)  I50.32     4. Chronic heart failure with preserved ejection fraction (HFpEF) (HCC)  I50.32     5. Paroxysmal atrial fibrillation (HCC)  I48.0 Hemoglobin and hematocrit, blood    Comprehensive metabolic panel with GFR    Comprehensive metabolic panel with GFR    Hemoglobin and hematocrit, blood    6. S/P Maze operation for atrial fibrillation  Z98.890    Z86.79     7. Long term current use of antiarrhythmic drug  Z79.899     8. Long term (current) use of anticoagulants  Z79.01 Hemoglobin and hematocrit, blood    Comprehensive metabolic panel with GFR    Comprehensive metabolic panel with GFR    Hemoglobin and hematocrit, blood     9. Nonrheumatic aortic valve stenosis  I35.0     10. Ascending aorta dilatation (HCC)  I77.810     11. Benign hypertension  I10     12. Type 2 diabetes mellitus with diabetic microalbuminuria, with long-term current use of insulin  (HCC)  E11.29    R80.9    Z79.4     13. Former smoker  Z87.891        Recommendation(s):  Atherosclerosis of native coronary artery of native heart without angina pectoris S/P CABG x 4 Heart failure with improved ejection fraction (HFimpEF) (HCC) Chronic HFpEF (heart failure with reduced ejection fraction) (HCC) Denies anginal chest pain or heart failure symptoms. Remains euvolemic. Stage C, NYHA class II: September 2022: LVEF 25-30%, see report December 2024 LVEF 55 to 60%, see report Continue aspirin  81 mg p.o. daily. Continue Farxiga  10 mg p.o. daily. Continue Toprol -XL 25 mg p.o. every morning. Continue Entresto  97/103 mg p.o. twice daily. Will reduce Ranexa  from 500 mg twice daily to 500 mg p.o. daily and if able to would like to discontinue the medication as he is on Multaq  for A-fib management  Paroxysmal atrial fibrillation (HCC) S/P Maze operation for atrial fibrillation Rate control: Toprol -XL. Rhythm control: Multaq . Thromboembolic prophylaxis: Eliquis  Successfully underwent TEE guided cardioversion in January 2023. Does not endorse evidence of bleeding. Has been evaluated for sleep apnea-avoiding device therapy.  Long term (current) use of anticoagulants Currently on Eliquis . Does not endorse evidence of bleeding. Risks, benefits, and alternatives to anticoagulation discussed.  Nonrheumatic aortic valve stenosis Currently asymptomatic. Echocardiogram December 2024 notes mild to moderate aortic stenosis. Will repeat echocardiogram in 1 year to reevaluate disease progression. Patient is asked to seek medical attention if he has new onset or worsening of heart failure, anginal chest pain, or syncopal events.  Ascending aorta  dilatation (HCC) CT chest without contrast January 2025 43 mm ascending thoracic aortic aneurysm Will schedule a CT of the chest in 1 year to reevaluate disease progression No prior history of aortic syndromes, he is aware of symptoms to be concerned of and if present he needs to go to the ER via EMS.   Orders Placed:  Orders Placed This Encounter  Procedures   Hemoglobin and hematocrit, blood    Standing Status:   Future    Number of Occurrences:   1    Expected Date:   12/05/2023    Expiration Date:   12/04/2024   Comprehensive metabolic panel with GFR    Standing Status:   Future    Number of Occurrences:   1    Expected Date:   12/05/2023    Expiration Date:   12/04/2024   EKG 12-Lead   Final Medication List:    No orders of the defined types were placed in this encounter.   There are no discontinued medications.    Current Outpatient Medications:    acetaminophen  (TYLENOL ) 500 MG tablet, Take 1,000 mg by mouth every 6 (six) hours as needed for moderate pain or mild pain., Disp: , Rfl:    aspirin  EC 81 MG tablet, Take 81 mg by mouth daily., Disp: , Rfl:    clotrimazole -betamethasone  (LOTRISONE ) cream, Apply sparingly to affected area bid, Disp: 30 g, Rfl: 1   ELIQUIS  5 MG TABS tablet, TAKE 1 TABLET TWICE DAILY, Disp: 180 tablet, Rfl: 3   ENTRESTO  97-103 MG, TAKE 1 TABLET TWICE DAILY, Disp: 180 tablet, Rfl: 3   famotidine  (PEPCID ) 40 MG tablet, Take 40 mg by mouth at bedtime., Disp: , Rfl:    FARXIGA  10 MG TABS tablet, TAKE 1 TABLET EVERY DAY, Disp: 90 tablet, Rfl: 3   glipiZIDE (GLUCOTROL XL) 10 MG 24 hr tablet, Take 10 mg by mouth 2 (two) times daily., Disp: , Rfl:    magnesium  oxide (  MAG-OX) 400 (240 Mg) MG tablet, TAKE 1 TABLET EVERY DAY, Disp: 90 tablet, Rfl: 2   metFORMIN  (GLUCOPHAGE ) 1000 MG tablet, Take 1,000 mg by mouth daily with breakfast., Disp: , Rfl:    metoprolol  succinate (TOPROL -XL) 25 MG 24 hr tablet, Take 1 tablet (25 mg total) by mouth every morning., Disp:  90 tablet, Rfl: 3   MULTAQ  400 MG tablet, TAKE 1 TABLET TWICE DAILY WITH MEALS, Disp: 180 tablet, Rfl: 3   nitroGLYCERIN  (NITROSTAT ) 0.4 MG SL tablet, Place under the tongue., Disp: , Rfl:    pantoprazole  (PROTONIX ) 40 MG tablet, Take 40 mg by mouth every evening., Disp: , Rfl:    potassium chloride  (KLOR-CON  M) 10 MEQ tablet, TAKE 1 TABLET EVERY DAY, Disp: 90 tablet, Rfl: 3   ranolazine  (RANEXA ) 500 MG 12 hr tablet, Take 1 tablet (500 mg total) by mouth daily., Disp: , Rfl:    rosuvastatin  (CRESTOR ) 5 MG tablet, Take 5 mg by mouth at bedtime., Disp: , Rfl:    amoxicillin -clavulanate (AUGMENTIN ) 875-125 MG tablet, Take 1 tablet by mouth every 12 (twelve) hours. (Patient not taking: Reported on 12/05/2023), Disp: 14 tablet, Rfl: 0  Consent:   NA  Disposition:   6 months follow-up sooner if needed Patient may be asked to follow-up sooner based on the results of the above-mentioned testing.  His questions and concerns were addressed to his satisfaction. He voices understanding of the recommendations provided during this encounter.    Signed, Madonna Large, DO, [facility] [place]  [facility]  [address] [place], [place] [address] 12/05/2023 1:22 PM

## 2023-12-05 NOTE — Patient Instructions (Addendum)
 Medication Instructions:  No medication changes were made at this visit. Continue current regimen.   *If you need a refill on your cardiac medications before your next appointment, please call your pharmacy*  Lab Work: [facility] and CMP  If you have labs (blood work) drawn today and your tests are completely normal, you will receive your results only by: MyChart Message (if you have MyChart) OR A paper copy in the mail If you have any lab test that is abnormal or we need to change your treatment, we will call you to review the results.  Testing/Procedures: None ordered today.  Follow-Up: At [facility], you and your health needs are our priority.  As part of our continuing mission to provide you with exceptional heart care, our providers are all part of one team.  This team includes your primary Cardiologist (physician) and Advanced Practice Providers or APPs (Physician Assistants and Nurse Practitioners) who all work together to provide you with the care you need, when you need it.  Your next appointment:   6 month(s)  Provider:   Madonna Large, DO    We recommend signing up for the patient portal called MyChart.  Sign up information is provided on this After Visit Summary.  MyChart is used to connect with patients for Virtual Visits (Telemedicine).  Patients are able to view lab/test results, encounter notes, upcoming appointments, etc.  Non-urgent messages can be sent to your provider as well.   To learn more about what you can do with MyChart, go to [email or web address].   Other Instructions Follow up with your PCP to check on cholesterol levels.

## 2023-12-06 ENCOUNTER — Ambulatory Visit: Payer: Self-pay | Admitting: *Deleted

## 2023-12-06 DIAGNOSIS — I10 Essential (primary) hypertension: Secondary | ICD-10-CM

## 2023-12-06 DIAGNOSIS — I4819 Other persistent atrial fibrillation: Secondary | ICD-10-CM

## 2023-12-06 LAB — COMPREHENSIVE METABOLIC PANEL WITH GFR
ALT: 18 IU/L (ref 0–44)
AST: 19 IU/L (ref 0–40)
Albumin: 4.3 g/dL (ref 3.8–4.8)
Alkaline Phosphatase: 86 IU/L (ref 44–121)
BUN/Creatinine Ratio: 17 (ref 10–24)
BUN: 15 mg/dL (ref 8–27)
Bilirubin Total: 0.9 mg/dL (ref 0.0–1.2)
CO2: 20 mmol/L (ref 20–29)
Calcium: 9.6 mg/dL (ref 8.6–10.2)
Chloride: 106 mmol/L (ref 96–106)
Creatinine, Ser: 0.9 mg/dL (ref 0.76–1.27)
Globulin, Total: 2.6 g/dL (ref 1.5–4.5)
Glucose: 267 mg/dL — ABNORMAL HIGH (ref 70–99)
Potassium: 4.5 mmol/L (ref 3.5–5.2)
Sodium: 141 mmol/L (ref 134–144)
Total Protein: 6.9 g/dL (ref 6.0–8.5)
eGFR: 87 mL/min/1.73 (ref >59)

## 2023-12-06 LAB — HEMOGLOBIN AND HEMATOCRIT, BLOOD
Hematocrit: 49.3 % (ref 37.5–51.0)
Hemoglobin: 16.2 g/dL (ref 13.0–17.7)

## 2023-12-07 ENCOUNTER — Encounter: Payer: Self-pay | Admitting: Cardiology

## 2023-12-12 ENCOUNTER — Other Ambulatory Visit: Payer: Self-pay | Admitting: Cardiology

## 2023-12-12 DIAGNOSIS — I5022 Chronic systolic (congestive) heart failure: Secondary | ICD-10-CM

## 2023-12-15 DIAGNOSIS — E1165 Type 2 diabetes mellitus with hyperglycemia: Secondary | ICD-10-CM | POA: Diagnosis not present

## 2023-12-15 DIAGNOSIS — I10 Essential (primary) hypertension: Secondary | ICD-10-CM | POA: Diagnosis not present

## 2023-12-19 DIAGNOSIS — R52 Pain, unspecified: Secondary | ICD-10-CM | POA: Diagnosis not present

## 2023-12-19 DIAGNOSIS — Z299 Encounter for prophylactic measures, unspecified: Secondary | ICD-10-CM | POA: Diagnosis not present

## 2023-12-19 DIAGNOSIS — I10 Essential (primary) hypertension: Secondary | ICD-10-CM | POA: Diagnosis not present

## 2023-12-19 DIAGNOSIS — I5022 Chronic systolic (congestive) heart failure: Secondary | ICD-10-CM | POA: Diagnosis not present

## 2023-12-19 DIAGNOSIS — M545 Low back pain, unspecified: Secondary | ICD-10-CM | POA: Diagnosis not present

## 2023-12-19 DIAGNOSIS — E119 Type 2 diabetes mellitus without complications: Secondary | ICD-10-CM | POA: Diagnosis not present

## 2023-12-21 ENCOUNTER — Other Ambulatory Visit: Payer: Self-pay | Admitting: Cardiology

## 2023-12-21 DIAGNOSIS — Z951 Presence of aortocoronary bypass graft: Secondary | ICD-10-CM

## 2023-12-21 DIAGNOSIS — I5022 Chronic systolic (congestive) heart failure: Secondary | ICD-10-CM

## 2023-12-21 DIAGNOSIS — I2510 Atherosclerotic heart disease of native coronary artery without angina pectoris: Secondary | ICD-10-CM

## 2024-01-10 DIAGNOSIS — L570 Actinic keratosis: Secondary | ICD-10-CM | POA: Diagnosis not present

## 2024-01-13 DIAGNOSIS — Z0000 Encounter for general adult medical examination without abnormal findings: Secondary | ICD-10-CM | POA: Diagnosis not present

## 2024-01-13 DIAGNOSIS — E7800 Pure hypercholesterolemia, unspecified: Secondary | ICD-10-CM | POA: Diagnosis not present

## 2024-01-13 DIAGNOSIS — I10 Essential (primary) hypertension: Secondary | ICD-10-CM | POA: Diagnosis not present

## 2024-01-13 DIAGNOSIS — Z79899 Other long term (current) drug therapy: Secondary | ICD-10-CM | POA: Diagnosis not present

## 2024-01-13 DIAGNOSIS — Z299 Encounter for prophylactic measures, unspecified: Secondary | ICD-10-CM | POA: Diagnosis not present

## 2024-01-13 DIAGNOSIS — R5383 Other fatigue: Secondary | ICD-10-CM | POA: Diagnosis not present

## 2024-01-14 DIAGNOSIS — E1165 Type 2 diabetes mellitus with hyperglycemia: Secondary | ICD-10-CM | POA: Diagnosis not present

## 2024-01-14 DIAGNOSIS — I10 Essential (primary) hypertension: Secondary | ICD-10-CM | POA: Diagnosis not present

## 2024-01-17 DIAGNOSIS — Z79899 Other long term (current) drug therapy: Secondary | ICD-10-CM | POA: Diagnosis not present

## 2024-01-17 DIAGNOSIS — Z125 Encounter for screening for malignant neoplasm of prostate: Secondary | ICD-10-CM | POA: Diagnosis not present

## 2024-01-17 DIAGNOSIS — E7800 Pure hypercholesterolemia, unspecified: Secondary | ICD-10-CM | POA: Diagnosis not present

## 2024-01-17 DIAGNOSIS — R5383 Other fatigue: Secondary | ICD-10-CM | POA: Diagnosis not present

## 2024-01-23 ENCOUNTER — Telehealth: Payer: Self-pay | Admitting: Cardiology

## 2024-01-23 NOTE — Telephone Encounter (Signed)
 Dentist office calling to inquire whether pt is needing an ABT to have a Filling done. Pt is currently in the chair at the office. They are needing a response ASAP. Please advise

## 2024-01-23 NOTE — Telephone Encounter (Signed)
 Dr. Viviann called again as patient is currently in dental chair.

## 2024-01-23 NOTE — Telephone Encounter (Signed)
   Pre-operative Risk Assessment    Patient Name: Derek Bond [facility]  DOB: [birth year] MRN: [id number]   Date of last office visit: 12/05/23 DR. TOLIA Date of next office visit: NONE  Request for Surgical Clearance    Procedure:  1 FILLING TO BE DONE TODAY  Date of Surgery:  Clearance 01/23/24                                Surgeon:  DR. VIVIANN, DDS Surgeon's Group or Practice Name:  [facility] Phone number:  [phone] Fax number:  [phone]   Type of Clearance Requested:   - Medical  - Pharmacy:  Hold Aspirin  and Apixaban  (Eliquis ) THOUGH NOT REQUEST TO BE HELD; THE REQUEST TODAY IS ASKING IF PT NEEDS SBE   Type of Anesthesia:  Local    Additional requests/questions:    Signed, Dariella Gillihan   01/23/2024, 10:03 AM

## 2024-01-23 NOTE — Telephone Encounter (Signed)
    Primary Cardiologist: Madonna Large, DO  Chart reviewed as part of pre-operative protocol coverage. Simple dental extractions are considered low risk procedures per guidelines and generally do not require any specific cardiac clearance. It is also generally accepted that for simple extractions and dental cleanings, there is no need to interrupt blood thinner therapy.   SBE prophylaxis is not required for the patient.  I will route this recommendation to the requesting party via Epic fax function and remove from pre-op pool.  Please call with questions.  Josefa CHRISTELLA Beauvais, NP 01/23/2024, 10:35 AM

## 2024-02-14 DIAGNOSIS — E1165 Type 2 diabetes mellitus with hyperglycemia: Secondary | ICD-10-CM | POA: Diagnosis not present

## 2024-02-14 DIAGNOSIS — I10 Essential (primary) hypertension: Secondary | ICD-10-CM | POA: Diagnosis not present

## 2024-02-28 DIAGNOSIS — M79674 Pain in right toe(s): Secondary | ICD-10-CM | POA: Diagnosis not present

## 2024-02-28 DIAGNOSIS — E1142 Type 2 diabetes mellitus with diabetic polyneuropathy: Secondary | ICD-10-CM | POA: Diagnosis not present

## 2024-02-28 DIAGNOSIS — B351 Tinea unguium: Secondary | ICD-10-CM | POA: Diagnosis not present

## 2024-02-28 DIAGNOSIS — L84 Corns and callosities: Secondary | ICD-10-CM | POA: Diagnosis not present

## 2024-02-28 DIAGNOSIS — M79675 Pain in left toe(s): Secondary | ICD-10-CM | POA: Diagnosis not present

## 2024-03-26 DIAGNOSIS — I10 Essential (primary) hypertension: Secondary | ICD-10-CM | POA: Diagnosis not present

## 2024-03-26 DIAGNOSIS — M179 Osteoarthritis of knee, unspecified: Secondary | ICD-10-CM | POA: Diagnosis not present

## 2024-03-26 DIAGNOSIS — E119 Type 2 diabetes mellitus without complications: Secondary | ICD-10-CM | POA: Diagnosis not present

## 2024-03-26 DIAGNOSIS — Z299 Encounter for prophylactic measures, unspecified: Secondary | ICD-10-CM | POA: Diagnosis not present

## 2024-03-26 DIAGNOSIS — Z23 Encounter for immunization: Secondary | ICD-10-CM | POA: Diagnosis not present

## 2024-04-01 ENCOUNTER — Other Ambulatory Visit: Payer: Self-pay | Admitting: Cardiology

## 2024-04-17 DIAGNOSIS — I4891 Unspecified atrial fibrillation: Secondary | ICD-10-CM | POA: Diagnosis not present

## 2024-04-17 DIAGNOSIS — E1142 Type 2 diabetes mellitus with diabetic polyneuropathy: Secondary | ICD-10-CM | POA: Diagnosis not present

## 2024-04-17 DIAGNOSIS — I130 Hypertensive heart and chronic kidney disease with heart failure and stage 1 through stage 4 chronic kidney disease, or unspecified chronic kidney disease: Secondary | ICD-10-CM | POA: Diagnosis not present

## 2024-04-17 DIAGNOSIS — I509 Heart failure, unspecified: Secondary | ICD-10-CM | POA: Diagnosis not present

## 2024-04-17 DIAGNOSIS — D6869 Other thrombophilia: Secondary | ICD-10-CM | POA: Diagnosis not present

## 2024-04-17 DIAGNOSIS — E1122 Type 2 diabetes mellitus with diabetic chronic kidney disease: Secondary | ICD-10-CM | POA: Diagnosis not present

## 2024-04-17 DIAGNOSIS — Z794 Long term (current) use of insulin: Secondary | ICD-10-CM | POA: Diagnosis not present

## 2024-04-17 DIAGNOSIS — D692 Other nonthrombocytopenic purpura: Secondary | ICD-10-CM | POA: Diagnosis not present

## 2024-04-17 DIAGNOSIS — I25119 Atherosclerotic heart disease of native coronary artery with unspecified angina pectoris: Secondary | ICD-10-CM | POA: Diagnosis not present

## 2024-04-25 ENCOUNTER — Encounter: Payer: Self-pay | Admitting: Cardiology

## 2024-04-25 DIAGNOSIS — I10 Essential (primary) hypertension: Secondary | ICD-10-CM | POA: Diagnosis not present

## 2024-04-25 LAB — BASIC METABOLIC PANEL WITH GFR
BUN/Creatinine Ratio: 18 (ref 10–24)
BUN: 18 mg/dL (ref 8–27)
CO2: 25 mmol/L (ref 20–29)
Calcium: 10 mg/dL (ref 8.6–10.2)
Chloride: 100 mmol/L (ref 96–106)
Creatinine, Ser: 0.99 mg/dL (ref 0.76–1.27)
Glucose: 234 mg/dL — ABNORMAL HIGH (ref 70–99)
Potassium: 5 mmol/L (ref 3.5–5.2)
Sodium: 138 mmol/L (ref 134–144)
eGFR: 78 mL/min/1.73 (ref >59)

## 2024-04-25 LAB — HEMOGLOBIN AND HEMATOCRIT, BLOOD
Hematocrit: 51.3 % — ABNORMAL HIGH (ref 37.5–51.0)
Hemoglobin: 16.9 g/dL (ref 13.0–17.7)

## 2024-04-26 ENCOUNTER — Other Ambulatory Visit: Payer: Self-pay | Admitting: Cardiology

## 2024-04-26 DIAGNOSIS — I5022 Chronic systolic (congestive) heart failure: Secondary | ICD-10-CM

## 2024-04-30 ENCOUNTER — Ambulatory Visit (HOSPITAL_COMMUNITY): Admission: RE | Admit: 2024-04-30 | Discharge: 2024-04-30 | Payer: Medicare PPO | Attending: Cardiology

## 2024-04-30 DIAGNOSIS — I110 Hypertensive heart disease with heart failure: Secondary | ICD-10-CM | POA: Insufficient documentation

## 2024-04-30 DIAGNOSIS — G4730 Sleep apnea, unspecified: Secondary | ICD-10-CM | POA: Diagnosis not present

## 2024-04-30 DIAGNOSIS — I255 Ischemic cardiomyopathy: Secondary | ICD-10-CM | POA: Diagnosis not present

## 2024-04-30 DIAGNOSIS — E785 Hyperlipidemia, unspecified: Secondary | ICD-10-CM | POA: Diagnosis not present

## 2024-04-30 DIAGNOSIS — Z87891 Personal history of nicotine dependence: Secondary | ICD-10-CM | POA: Diagnosis not present

## 2024-04-30 DIAGNOSIS — I4891 Unspecified atrial fibrillation: Secondary | ICD-10-CM | POA: Insufficient documentation

## 2024-04-30 DIAGNOSIS — E119 Type 2 diabetes mellitus without complications: Secondary | ICD-10-CM | POA: Insufficient documentation

## 2024-04-30 DIAGNOSIS — K8020 Calculus of gallbladder without cholecystitis without obstruction: Secondary | ICD-10-CM | POA: Diagnosis not present

## 2024-04-30 DIAGNOSIS — I77810 Thoracic aortic ectasia: Secondary | ICD-10-CM

## 2024-04-30 DIAGNOSIS — I447 Left bundle-branch block, unspecified: Secondary | ICD-10-CM | POA: Insufficient documentation

## 2024-04-30 DIAGNOSIS — I350 Nonrheumatic aortic (valve) stenosis: Secondary | ICD-10-CM

## 2024-04-30 DIAGNOSIS — Z951 Presence of aortocoronary bypass graft: Secondary | ICD-10-CM | POA: Insufficient documentation

## 2024-04-30 DIAGNOSIS — I2510 Atherosclerotic heart disease of native coronary artery without angina pectoris: Secondary | ICD-10-CM | POA: Insufficient documentation

## 2024-04-30 DIAGNOSIS — I509 Heart failure, unspecified: Secondary | ICD-10-CM | POA: Insufficient documentation

## 2024-04-30 DIAGNOSIS — K449 Diaphragmatic hernia without obstruction or gangrene: Secondary | ICD-10-CM | POA: Diagnosis not present

## 2024-04-30 LAB — ECHOCARDIOGRAM COMPLETE
AR max vel: 1.2 cm2
AV Area VTI: 1.25 cm2
AV Area mean vel: 1.21 cm2
AV Mean grad: 22 mmHg
AV Peak grad: 39.6 mmHg
Ao pk vel: 3.15 m/s
S' Lateral: 3.15 cm

## 2024-05-01 ENCOUNTER — Ambulatory Visit: Payer: Self-pay | Admitting: Cardiology

## 2024-05-15 NOTE — Telephone Encounter (Signed)
 Good morning, Dr Michele would just like you to follow up with him for now.

## 2024-06-11 NOTE — Telephone Encounter (Signed)
 Pt was called to confirm his appointment tomorrow, due to the weather he is unable to make it. Next appt is 08/13/24. He is very concerned about waiting that long as he was hoping to go over these test results. He asked if a nurse call him to go over the results again. Informed him, he will more than likely get a call tomorrow or Wednesday this week.

## 2024-06-12 ENCOUNTER — Ambulatory Visit: Admitting: Cardiology

## 2024-06-13 NOTE — Telephone Encounter (Signed)
-----   Message from [place], [place] sent at 05/01/2024  7:20 PM EST ----- Left ventricular ejection fraction (pumping activity) of the heart is within the normal limit.  Mild to moderate aortic stenosis is now moderate. Ascending aorta was 43 mm now measuring 44 mm Details will be reviewed at the next office visit.   Sunit [place], DO, FACC

## 2024-06-13 NOTE — Telephone Encounter (Signed)
 Called patient at his request to review Echo results. Patient was told that based on previous Echo measurements that his Aortic Stenosis had progressed. Patient asked if AS could be causing him to feel tired all the time and was told that fatigue is a common symptom of AS. All concerns addressed.

## 2024-06-16 ENCOUNTER — Other Ambulatory Visit: Payer: Self-pay | Admitting: Cardiology

## 2024-06-16 DIAGNOSIS — Z7901 Long term (current) use of anticoagulants: Secondary | ICD-10-CM

## 2024-06-16 DIAGNOSIS — I480 Paroxysmal atrial fibrillation: Secondary | ICD-10-CM

## 2024-08-13 ENCOUNTER — Ambulatory Visit: Admitting: Cardiology
